# Patient Record
Sex: Female | Born: 1935 | Race: White | Hispanic: No | Smoking: Never smoker
Health system: Southern US, Community
[De-identification: ages and names within clinical notes are randomized; demographics above are authoritative.]

## PROBLEM LIST (undated history)

## (undated) DIAGNOSIS — M1990 Unspecified osteoarthritis, unspecified site: Secondary | ICD-10-CM

## (undated) DIAGNOSIS — I495 Sick sinus syndrome: Secondary | ICD-10-CM

## (undated) DIAGNOSIS — I10 Essential (primary) hypertension: Secondary | ICD-10-CM

## (undated) DIAGNOSIS — F32A Depression, unspecified: Secondary | ICD-10-CM

## (undated) DIAGNOSIS — Z79899 Other long term (current) drug therapy: Secondary | ICD-10-CM

## (undated) DIAGNOSIS — M810 Age-related osteoporosis without current pathological fracture: Secondary | ICD-10-CM

## (undated) DIAGNOSIS — K56609 Unspecified intestinal obstruction, unspecified as to partial versus complete obstruction: Secondary | ICD-10-CM

## (undated) DIAGNOSIS — Z7901 Long term (current) use of anticoagulants: Secondary | ICD-10-CM

## (undated) DIAGNOSIS — F419 Anxiety disorder, unspecified: Secondary | ICD-10-CM

## (undated) DIAGNOSIS — F410 Panic disorder [episodic paroxysmal anxiety] without agoraphobia: Secondary | ICD-10-CM

## (undated) DIAGNOSIS — I480 Paroxysmal atrial fibrillation: Secondary | ICD-10-CM

## (undated) DIAGNOSIS — E119 Type 2 diabetes mellitus without complications: Secondary | ICD-10-CM

## (undated) DIAGNOSIS — N200 Calculus of kidney: Secondary | ICD-10-CM

## (undated) DIAGNOSIS — F329 Major depressive disorder, single episode, unspecified: Secondary | ICD-10-CM

## (undated) DIAGNOSIS — C4490 Unspecified malignant neoplasm of skin, unspecified: Secondary | ICD-10-CM

## (undated) DIAGNOSIS — I351 Nonrheumatic aortic (valve) insufficiency: Secondary | ICD-10-CM

## (undated) DIAGNOSIS — I5032 Chronic diastolic (congestive) heart failure: Secondary | ICD-10-CM

## (undated) HISTORY — DX: Essential (primary) hypertension: I10

## (undated) HISTORY — DX: Paroxysmal atrial fibrillation: I48.0

## (undated) HISTORY — DX: Nonrheumatic aortic (valve) insufficiency: I35.1

## (undated) HISTORY — PX: TONSILLECTOMY: SUR1361

## (undated) HISTORY — PX: SKIN CANCER EXCISION: SHX779

## (undated) HISTORY — DX: Sick sinus syndrome: I49.5

## (undated) HISTORY — DX: Long term (current) use of anticoagulants: Z79.01

## (undated) HISTORY — DX: Anxiety disorder, unspecified: F41.9

## (undated) HISTORY — PX: BREAST CYST EXCISION: SHX579

## (undated) HISTORY — DX: Other long term (current) drug therapy: Z79.899

## (undated) HISTORY — DX: Chronic diastolic (congestive) heart failure: I50.32

---
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
## 1988-09-24 HISTORY — PX: CATARACT EXTRACTION W/ INTRAOCULAR LENS  IMPLANT, BILATERAL: SHX1307

## 1997-12-17 ENCOUNTER — Other Ambulatory Visit: Admission: RE | Admit: 1997-12-17 | Discharge: 1997-12-17 | Payer: Self-pay | Admitting: Cardiology

## 1999-02-04 ENCOUNTER — Encounter: Payer: Self-pay | Admitting: Cardiology

## 1999-02-04 ENCOUNTER — Encounter: Admission: RE | Admit: 1999-02-04 | Discharge: 1999-02-04 | Payer: Self-pay | Admitting: Cardiology

## 2000-02-07 ENCOUNTER — Encounter: Admission: RE | Admit: 2000-02-07 | Discharge: 2000-02-07 | Payer: Self-pay | Admitting: Internal Medicine

## 2000-02-07 ENCOUNTER — Encounter: Payer: Self-pay | Admitting: Internal Medicine

## 2001-11-07 ENCOUNTER — Encounter: Payer: Self-pay | Admitting: Ophthalmology

## 2001-11-09 ENCOUNTER — Ambulatory Visit (HOSPITAL_COMMUNITY): Admission: RE | Admit: 2001-11-09 | Discharge: 2001-11-09 | Payer: Self-pay | Admitting: Ophthalmology

## 2002-04-23 ENCOUNTER — Encounter: Payer: Self-pay | Admitting: Internal Medicine

## 2002-04-23 ENCOUNTER — Encounter: Admission: RE | Admit: 2002-04-23 | Discharge: 2002-04-23 | Payer: Self-pay | Admitting: Internal Medicine

## 2003-06-27 ENCOUNTER — Ambulatory Visit (HOSPITAL_COMMUNITY): Admission: RE | Admit: 2003-06-27 | Discharge: 2003-06-27 | Payer: Self-pay | Admitting: Ophthalmology

## 2003-09-09 ENCOUNTER — Encounter: Admission: RE | Admit: 2003-09-09 | Discharge: 2003-09-09 | Payer: Self-pay | Admitting: Internal Medicine

## 2004-09-09 ENCOUNTER — Encounter: Admission: RE | Admit: 2004-09-09 | Discharge: 2004-09-09 | Payer: Self-pay | Admitting: Internal Medicine

## 2004-09-20 ENCOUNTER — Encounter: Admission: RE | Admit: 2004-09-20 | Discharge: 2004-09-20 | Payer: Self-pay | Admitting: Internal Medicine

## 2004-12-09 HISTORY — PX: CARDIOVASCULAR STRESS TEST: SHX262

## 2005-04-10 ENCOUNTER — Emergency Department (HOSPITAL_COMMUNITY): Admission: EM | Admit: 2005-04-10 | Discharge: 2005-04-10 | Payer: Self-pay | Admitting: Emergency Medicine

## 2005-10-04 ENCOUNTER — Emergency Department (HOSPITAL_COMMUNITY): Admission: EM | Admit: 2005-10-04 | Discharge: 2005-10-04 | Payer: Self-pay | Admitting: Emergency Medicine

## 2005-11-17 ENCOUNTER — Encounter: Admission: RE | Admit: 2005-11-17 | Discharge: 2005-11-17 | Payer: Self-pay | Admitting: Internal Medicine

## 2005-12-01 ENCOUNTER — Encounter: Admission: RE | Admit: 2005-12-01 | Discharge: 2005-12-01 | Payer: Self-pay | Admitting: Internal Medicine

## 2006-03-12 ENCOUNTER — Emergency Department (HOSPITAL_COMMUNITY): Admission: EM | Admit: 2006-03-12 | Discharge: 2006-03-12 | Payer: Self-pay | Admitting: Emergency Medicine

## 2006-03-16 ENCOUNTER — Observation Stay (HOSPITAL_COMMUNITY): Admission: AD | Admit: 2006-03-16 | Discharge: 2006-03-17 | Payer: Self-pay | Admitting: *Deleted

## 2006-03-16 HISTORY — PX: INSERT / REPLACE / REMOVE PACEMAKER: SUR710

## 2006-05-31 ENCOUNTER — Encounter: Admission: RE | Admit: 2006-05-31 | Discharge: 2006-05-31 | Payer: Self-pay | Admitting: Internal Medicine

## 2006-11-06 ENCOUNTER — Encounter: Admission: RE | Admit: 2006-11-06 | Discharge: 2006-11-06 | Payer: Self-pay | Admitting: Internal Medicine

## 2007-01-30 ENCOUNTER — Encounter: Admission: RE | Admit: 2007-01-30 | Discharge: 2007-01-30 | Payer: Self-pay | Admitting: Internal Medicine

## 2007-02-12 ENCOUNTER — Encounter: Admission: RE | Admit: 2007-02-12 | Discharge: 2007-02-12 | Payer: Self-pay | Admitting: Internal Medicine

## 2009-06-09 ENCOUNTER — Ambulatory Visit (HOSPITAL_COMMUNITY): Admission: RE | Admit: 2009-06-09 | Discharge: 2009-06-09 | Payer: Self-pay | Admitting: Internal Medicine

## 2009-12-05 ENCOUNTER — Encounter: Payer: Self-pay | Admitting: Internal Medicine

## 2009-12-23 ENCOUNTER — Ambulatory Visit: Payer: Self-pay | Admitting: Internal Medicine

## 2010-02-23 NOTE — Miscellaneous (Signed)
Summary: Device preload  Clinical Lists Changes  Observations: Added new observation of PPM INDICATN: Sick sinus syndrome (12/05/2009 11:13) Added new observation of MAGNET RTE: BOL 85 ERI 65 (12/05/2009 11:13) Added new observation of PPMLEADSTAT2: active (12/05/2009 11:13) Added new observation of PPMLEADSER2: [id number] (12/05/2009 11:13) Added new observation of PPMLEADMOD2: 4076  (12/05/2009 11:13) Added new observation of PPMLEADDOI2: 03/16/2006  (12/05/2009 11:13) Added new observation of PPMLEADLOC2: RV  (12/05/2009 11:13) Added new observation of PPMLEADSTAT1: active  (12/05/2009 11:13) Added new observation of PPMLEADSER1: [id number]  (12/05/2009 11:13) Added new observation of PPMLEADMOD1: 5076  (12/05/2009 11:13) Added new observation of PPMLEADDOI1: 03/16/2006  (12/05/2009 11:13) Added new observation of PPMLEADLOC1: RA  (12/05/2009 11:13) Added new observation of PPM DOI: 03/16/2006  (12/05/2009 11:13) Added new observation of PPM SERL#: [id number]  (12/05/2009 11:13) Added new observation of PPM MODL#: [id number]  (12/05/2009 11:13) Added new observation of PACEMAKERMFG: Medtronic  (12/05/2009 11:13) Added new observation of PPM IMP MD: Charlynn Court  (12/05/2009 11:13) Added new observation of PPM REFER MD: Vonna Drafts  (12/05/2009 11:13) Added new observation of PACEMAKER MD: Hillis Range, MD  (12/05/2009 11:13)      PPM Specifications Following MD:  Hillis Range, MD     Referring MD:  Vonna Drafts PPM Vendor:  Medtronic     PPM Model Number:  [id number]     PPM Serial Number:  [id number] PPM DOI:  03/16/2006     PPM Implanting MD:  Charlynn Court  Lead 1    Location: RA     DOI: 03/16/2006     Model #: [id number]     Serial #: [id number]     Status: active Lead 2    Location: RV     DOI: 03/16/2006     Model #: [id number]     Serial #: [id number]     Status: active  Magnet Response Rate:  BOL 85 ERI 65  Indications:  Sick sinus syndrome

## 2010-02-23 NOTE — Procedures (Signed)
Summary: pacer check/medtronic   Current Medications (verified): 1)  Aspirin 325 Mg Tabs (Aspirin) .... One By Mouth Daily 2)  Toprol Xl 50 Mg Xr24h-Tab (Metoprolol Succinate) .... 1/2 By Mouth Two Times A Day 3)  Crestor 5 Mg Tabs (Rosuvastatin Calcium) .... One By Mouth Daily 4)  Zoloft 50 Mg Tabs (Sertraline Hcl) .... One By Mouth Daily 5)  Ativan 0.5 Mg Tabs (Lorazepam) .... As Needed 6)  Fish Oil 1000 Mg Caps (Omega-3 Fatty Acids) .... One By Mouth Daily 7)  Citracal Plus  Tabs (Multiple Minerals-Vitamins) .... One By Mouth Daily 8)  Vitamin D3 50000 Unit Tabs (Cholecalciferol) .... One By Mouth Weekly  Allergies (verified): 1)  ! Sulfa  PPM Specifications Following MD:  Hillis Range, MD     Referring MD:  Vonna Drafts PPM Vendor:  Medtronic     PPM Model Number:  [id number]     PPM Serial Number:  [id number] PPM DOI:  03/16/2006     PPM Implanting MD:  Charlynn Court  Lead 1    Location: RA     DOI: 03/16/2006     Model #: 5076     Serial #: [id number]     Status: active Lead 2    Location: RV     DOI: 03/16/2006     Model #: 5784     Serial #: [id number]     Status: active  Magnet Response Rate:  BOL 85 ERI 65  Indications:  Sick sinus syndrome   PPM Follow Up Remote Check?  No Battery Voltage:  2.99 V       PPM Device Measurements Atrium  Amplitude: 2.4 mV, Impedance: 568 ohms, Threshold: 0.5 V at 0.4 msec Right Ventricle  Amplitude: 4.5 mV, Impedance: 416 ohms, Threshold: 1.0 V at 0.4 msec  Episodes MS Episodes:  1     Percent Mode Switch:  <0.1%     Coumadin:  No Ventricular High Rate:  97%     Atrial Pacing:  0.2%      Parameters Mode:  DDDR+     Lower Rate Limit:  70     Upper Rate Limit:  130 Paced AV Delay:  180     Sensed AV Delay:  150 Next Remote Date:  03/25/2010     Next Cardiology Appt Due:  11/25/2010 Tech Comments:  Atrial sensitivity reprogrammed for FFRW.  Carelink transmissions every 3 months.  ROV 1 year with Dr. Johney Frame. Altha Harm, LPN   December 23, 2009 12:07 PM

## 2010-02-25 NOTE — Cardiovascular Report (Signed)
Summary: Office Visit   Office Visit   Imported By: Roderic Ovens 01/04/2010 13:47:17  _____________________________________________________________________  External Attachment:    Type:   Image     Comment:   External Document

## 2010-03-12 ENCOUNTER — Ambulatory Visit (INDEPENDENT_AMBULATORY_CARE_PROVIDER_SITE_OTHER): Payer: Medicare Other | Admitting: *Deleted

## 2010-03-12 DIAGNOSIS — E7800 Pure hypercholesterolemia, unspecified: Secondary | ICD-10-CM

## 2010-03-12 DIAGNOSIS — R002 Palpitations: Secondary | ICD-10-CM

## 2010-03-12 DIAGNOSIS — I4891 Unspecified atrial fibrillation: Secondary | ICD-10-CM

## 2010-03-28 ENCOUNTER — Encounter (INDEPENDENT_AMBULATORY_CARE_PROVIDER_SITE_OTHER): Payer: Self-pay | Admitting: *Deleted

## 2010-03-29 ENCOUNTER — Encounter (INDEPENDENT_AMBULATORY_CARE_PROVIDER_SITE_OTHER): Payer: Medicare Other

## 2010-03-29 ENCOUNTER — Encounter: Payer: Self-pay | Admitting: Internal Medicine

## 2010-03-29 DIAGNOSIS — I495 Sick sinus syndrome: Secondary | ICD-10-CM

## 2010-04-06 NOTE — Letter (Signed)
Summary: Device-Delinquent Phone Journalist, newspaper, Main Office  [address]   [place], [place] [address]   Phone: [phone]  Fax: [phone]     March 28, 2010 MRN: [id number]   [facility] [address] [place], [place]  [address]   Dear Jo [name],  According to our records, you were scheduled for a device phone transmission on 03-25-10.     We did not receive any results from this check.  If you transmitted on your scheduled day, please call us to help troubleshoot your system.  If you forgot to send your transmission, please send one upon receipt of this letter.  Thank you,   Architectural technologist [facility]

## 2010-04-12 ENCOUNTER — Encounter: Payer: Self-pay | Admitting: *Deleted

## 2010-04-22 NOTE — Cardiovascular Report (Signed)
Summary: Office Visit   Office Visit   Imported By: Roderic Ovens 04/13/2010 14:07:59  _____________________________________________________________________  External Attachment:    Type:   Image     Comment:   External Document

## 2010-04-22 NOTE — Letter (Signed)
Summary: Remote Device Check  [employer], Main Office  [address]   [place], [place] [address]   Phone: [phone]  Fax: [phone]     April 12, 2010 MRN: [id number]   [facility] [address] [place], [place]  [address]   Dear Ms. Jo [name],   Your remote transmission was recieved and reviewed by your physician.  All diagnostics were within normal limits for you.  __X___Your next transmission is scheduled for: 07-01-2010.  Please transmit at any time this day.  If you have a wireless device your transmission will be sent automatically.   Sincerely,  Vella Kohler

## 2010-06-11 NOTE — Op Note (Signed)
NAMEMarland Kitchen  Jo [name], Jo [name]                             ACCOUNT NO.:  [id number]   MEDICAL RECORD NO.:  [id number]                   PATIENT TYPE:  OIB   LOCATION:  2876                                 FACILITY:  [facility]   PHYSICIAN:  Guadelupe Sabin, M.D.             DATE OF BIRTH:  [birth year]   DATE OF PROCEDURE:  11/09/2001  DATE OF DISCHARGE:  11/09/2001                                 OPERATIVE REPORT   PREOPERATIVE DIAGNOSIS:  Anterior subcapsular, cortical, and nuclear  cataract, right eye.   POSTOPERATIVE DIAGNOSIS:  Anterior subcapsular, cortical, and nuclear  cataract, right eye.   OPERATION:  Planned extracapsular cataract extraction, Fick emulsification,  primary insertion of posterior chamber intraocular lens implant.   SURGEON:  Guadelupe Sabin, M.D.   ASSISTANT:  Nurse.   ANESTHESIA:  Local 4% Xylocaine, 0.75 Marcaine, anesthesia standby required.  The patient is given Ditropan intravenously during the period of retrobulbar  blocking.   DESCRIPTION OF PROCEDURE:  After the patient was prepped and draped the lid  speculum was inserted in the right eye.  The eye was turned downward, and a  superior rectus traction suture placed.  Schiotz tenometry was recorded at 5  scale units with a 5.5 g weight.  A peritomy was performed adjacent to the  limbus from the 11 to 1 o'clock position.  The corneoscleral junction was  cleaned, and a corneoscleral groove made with a 45 degree Superblade.  The  anterior chamber was then entered with the 2.5 mm diamond keratome at the 12  o'clock position and the 15 degree blade at the 2:30 position.  Using a bent  26 gauge needle on a Healon syringe, a circular capsular rexus was begun,  and then completed with the Grabow forceps.  Hydrodissection and  hydrodelineation were performed using 1% Xylocaine.  The 30 degree Fink  emulsification tip was then inserted with slow controlled emulsification  with back-cracking and fragmentation with the  Beckard pick.  Following  removal of the nucleus, total ultrasonic time was 1 minute 2 seconds,  average power level 17%, 60 cc of fluid were used.  Following the nucleus  removal the remaining cortex was aspirated with the irrigation aspiration  tip.  The posterior capsule appeared intact with a brilliant red fundus  reflex.  It was therefore elected to insert an [employer] medical optics SI40NB  silicone three piece posterior chamber interocular lens implant with UV  absorber.  Diopter strength +20.00.  This was inserted with the McDonald  forceps into the anterior chamber and then centered into the capsular bag  using the [facility] lens rotator.  The lens appeared to be well centered.  The  Healon which had been used during the procedure was aspirated and replaced  with balanced salt solution and Miochol ophthalmic solution.  The operative  incision appeared to be slightly leaking at the 12 o'clock position, and  it  was elected to place a single 10-0 interrupted nylon  radial suture to close the incision securely.  Maxitrol ointment was  instilled in the conjunctival cul-de-sac, and the light patch and protector  shield applied.  Duration of procedure and anesthesia administration one  hour.  The patient tolerated the procedure well, in general, left the  operating room for the recovery room in good condition.                                               Guadelupe Sabin, M.D.    [name]/MEDQ  D:  11/09/2001  T:  11/10/2001  Job:  [id number]   cc:   Loraine Leriche A. Perini, M.D.

## 2010-06-11 NOTE — Op Note (Signed)
Jo [name], SHEVLIN NO.:  [id number]   MEDICAL RECORD NO.:  [id number]          PATIENT TYPE:  OIB   LOCATION:  2807                         FACILITY:  [facility]   PHYSICIAN:  Elmore Guise., M.D.DATE OF BIRTH:  August 01, 1917   DATE OF PROCEDURE:  03/16/2006  DATE OF DISCHARGE:                               OPERATIVE REPORT   INDICATIONS FOR PROCEDURE:  Sick sinus syndrome, bradycardia.   HISTORY OF PRESENT ILLNESS:  Ms. Attaway is a very pleasant 77 year old  white female with past medical history of paroxysmal atrial fibrillation  who presented with fatigue and weakness.  She underwent Holter  monitoring which was consistent with sick sinus syndrome.  She is now  referred for pacemaker implant.   PROCEDURES DESCRIPTION:  Patient was brought to the cardiac cath lab  after appropriate informed consent.  She is prepped and draped in  sterile fashion.  Approximately 15 mL of 1% lidocaine was used for local  anesthesia.  A venogram was performed showing the course of the left  axillary/subclavian vein.  Left subclavian vein was then accessed after  multiple attempts medially.  We did use multiple attempts over the first  rib as well as over the second rib.  A second venogram was performed  showing spasm of the left axillary/subclavian vein.  A third venogram  was also performed showing some improvement in her flow through this  vein but continued to spasm at that area.  The Doppler needle was used  for assistance with access.  The left subclavian vein was punctured in 2  separate sticks.  Two 7-French safety sheaths were placed over the  retained wires.  The ventricular lead is a Medtronic active fixation  CapSureFix R Novus 4076-52 cm, serial number [id number].   The following measurements were obtained:  Impedance of 952 ohms.  Sensing of 9.4 volts with a threshold of 1.0 volts at 0.5 milliseconds  and a current of 1.2 mA.  The atrial lead was then placed.  The  atrial  lead is a Medtronic 5076-45 cm, serial number [id number].  The following  measurements were obtained:  Impedance of 743 ohms.  P-waves measured  3.1 mV with threshold of 0.7 volts at 0.5 milliseconds, a current of 0.9  mA.  The safety sheaths were then removed.  The atrial and ventricular  lead were sewn into the pocket.  The pocket was irrigated with kanamycin  solution.  The atrial and ventricular leads were then identified and  placed into the appropriate portions of the header in a Medtronic  EnRhythm, P1501DR, serial number [id number].  The pacemaker was sewn  into the pocket.  The pocket was then closed in 3 continuous layers with  2-0 followed by 2-0 followed by 4-0 Vicryl suture.  Steri-Strips were  placed across the wound.  The patient tolerated the procedure well with  no apparent complications.      Elmore Guise., M.D.  Electronically Signed     TWK/MEDQ  D:  03/16/2006  T:  03/16/2006  Job:  [id number]

## 2010-06-11 NOTE — Op Note (Signed)
NAMEMarland Kitchen  Jo [name], Jo [name]                             ACCOUNT NO.:  [id number]   MEDICAL RECORD NO.:  [id number]                   PATIENT TYPE:  OIB   LOCATION:  2899                                 FACILITY:  [facility]   PHYSICIAN:  Guadelupe Sabin, M.D.             DATE OF BIRTH:  12-12-1917   DATE OF PROCEDURE:  06/27/2003  DATE OF DISCHARGE:  06/27/2003                                 OPERATIVE REPORT   PREOPERATIVE DIAGNOSIS:  Senile anterior subcapsular cortical and nuclear  cataract, left eye.   POSTOPERATIVE DIAGNOSIS:  Senile anterior subcapsular cortical and nuclear  cataract, left eye.   NAME OF OPERATION:  Planned extracapsular cataract extraction-  phacoemulsification, primary insertion of posterior chamber intraocular lens  implant.   SURGEON:  Guadelupe Sabin, M.D.   ASSISTANT:  Nurse.   ANESTHESIA:  Local 4% Xylocaine, 0.75% Marcaine retrobulbar block with  Wydase added.  Topical Tetracaine.  Intraocular Xylocaine.  Anesthesia  standby required in this 77 year old patient.  The patient was given  Ditropan intravenously during the period of retrobulbar injection.   OPERATIVE PROCEDURE:  After the patient was prepped and draped, a lid  speculum was inserted in the left eye.  The eye was turned downward and a  superior rectus traction suture placed.  Schiotz tonometry was recorded at 8  scale units with a 5.5 g weight, indicating a normal pressure.   OPERATIVE PROCEDURE:  After the patient was prepped and draped, a lid  speculum was inserted in the left eye.  A peritomy was performed adjacent to  the limbus from the 11 to 1 o'clock position.  The corneoscleral junction  was cleaned and a corneoscleral groove made with a 45 degree Superblade.  The anterior chamber was then entered with the 2.5 mm diamond keratome at  the 12 o'clock position and the 15 degree blade at the 2:30 position.  Using  a bent 26-gauge needle on an Occucoat syringe, a circular capsulorhexis was  begun and then completed with the Grabow forceps.  Hydrodissection and  hydrodelineation were performed using 1% Xylocaine.  A 30 degree  phacoemulsification tip was then inserted and slow controlled emulsification  of the lens nucleus accomplished with back-cracking with the Bechert pick.  Total ultrasonic time 1 minute 25 seconds, average power level 13%, total  amount of fluid used 90 mL.  Following removal of the nucleus, the residual  cortex was aspirated with the irrigation/aspiration tip.  The posterior  capsule appeared intact with a brilliant red fundus reflex.  It was  therefore elected to insert an [employer]er[employer] Optics SI40NB silicone three-  piece posterior chamber intraocular lens implant, diopter strength +21.50.  This was inserted with the McDonald forceps into the anterior chamber and  then centered into the capsular bag using the [facility] lens rotator.  The lens  appeared to be well-centered.  The Healon and Occucoat which had been  used  during the procedure intermittently was removed and replaced with balanced  salt solution and Miochol ophthalmic solution.  The operative incisions  appeared to be self-sealing.  It was elected, however, to place a single 10-  0 radial nylon suture at the 12 o'clock position to ensure closure and to  prevent  endophthalmitis.  Maxitrol ointment was instilled in the conjunctival cul-de-  sac and a light patch and protective shield applied.  Duration of procedure  45 minutes.  The patient tolerated the procedure well in general, left the  operating room for the recovery room in good condition.                                               Guadelupe Sabin, M.D.    [name]/MEDQ  D:  06/27/2003  T:  06/28/2003  Job:  [id number]

## 2010-06-11 NOTE — Discharge Summary (Signed)
[name], [name] NO.:  [id number]   MEDICAL RECORD NO.:  [id number]          PATIENT TYPE:  INP   LOCATION:  2032                         FACILITY:  [facility]   PHYSICIAN:  Elmore Guise., M.D.DATE OF BIRTH:  [birth year]   DATE OF ADMISSION:  03/16/2006  DATE OF DISCHARGE:  03/17/2006                               DISCHARGE SUMMARY   DISCHARGE DIAGNOSES:  1. Sick sinus syndrome status post dual-chamber permanent pacemaker      with a Medtronic EnRhythm generator.  2. History of paroxysmal atrial fibrillation now in normal sinus      rhythm.  3. History of hypertension.   HISTORY OF PRESENT ILLNESS:  The patient is a very pleasant 77 year old  white female who has had recent ER evaluation for fatigue and  presyncope.  She was found to be bradycardic with heart rates in the  40s.  She was then referred for pacemaker implant.   HOSPITAL COURSE:  The patient underwent permanent pacemaker implant on  March 16, 2006.  The procedure was prolonged; however, once venous  access was obtained, the procedure went very well.  A Smart needle as  well as venograms were performed to help with venous access.  Post-  procedure, the patient did very well.  She was A-pace, V-sense 98% at  time.  Her blood pressure remained stable, and her post-procedure chest  x-ray showed appropriate position of her atrial and ventricular lead  with no pneumothorax and no pleural or pericardial effusion noted.  Her  pacemaker was interrogated on post-procedure day #1 and showed excellent  thresholds in the atrial and ventricular chambers of 0.5 volts at 0.4  milliseconds, respectively.  Her impedance was normal, and her sensing  was also normal.  We discussed post-pacemaker restrictions, including  decreased activity with her left arm for the next two weeks as well as  keeping her site clean and dry for the next week.  She will Betadine her  Steri-Strips twice a day for the next three  days upon discharge.  She is  to call the office should she have any questions or concerns regarding  her device.  A restriction sheet as well as a pacemaker booklet was  given to the patient.   DISCHARGE MEDICATIONS:  1. Toprol XL 25 mg daily.  2. Zoloft 50 mg daily.  3. Clonazepam 0.5 mg one in the morning, a quarter at night.  4. Aspirin, a half a tablet daily.  5. Calcium once daily.  6. Multivitamin daily.  7. Fish oil tablets daily.  8. Augmentin 875 mg two times daily for the next five days.  9. Tylenol extra-strength 650 mg q.6h. as needed for pain.  10.She is to stop/hold her Diovan at this time.   All of her questions were answered.  She will follow up with Dr. Reyes Ivan  in one week for blood pressure check as well as wound check.      Elmore Guise., M.D.  Electronically Signed     TWK/MEDQ  D:  03/17/2006  T:  03/17/2006  Job:  [id number]   cc:   Loraine Leriche A. Perini, M.D.

## 2010-06-11 NOTE — H&P (Signed)
NAMEMarland Kitchen  Jo [name], Jo [name]                             ACCOUNT NO.:  [id number]   MEDICAL RECORD NO.:  [id number]                   [name] TYPE:  OIB   LOCATION:  2876                                 FACILITY:  [facility]   PHYSICIAN:  Guadelupe Sabin, M.D.             DATE OF BIRTH:  08-03-1917   DATE OF ADMISSION:  11/09/2001  DATE OF DISCHARGE:  11/09/2001                                HISTORY & PHYSICAL   HISTORY OF PRESENT ILLNESS:  This was a planned outpatient surgical  admission of this 77 year old white female admitted for cataract implant  surgery of Jo right eye.   [name] has been followed in my office since 11/11/68, for routine eye  care.  She was initially noted to have presbyopia.  Over Jo ensuing years,  Jo [name] has gradually developed a dense subcapsular, cortical, and  nuclear cataract formation of both eyes.  This has caused her to have  difficulty with seeing, presby vision, small print difficult to read, and  glare bothers her.  She has signed an informed consent, and arrangements are  made for her outpatient admission at this time.   PAST MEDICAL HISTORY:  Jo [name] is in stable general health under Jo  care of Dr. Waynard Edwards. Jo [name] is felt to be in satisfactory condition for  Jo proposed surgery, and to be a low risk.   CURRENT MEDICATIONS:  1. Toprol XL.  2. Zoloft.  3. Klonopin.   ALLERGIES:  1. SULFA DRUGS.  2. CELEXA.   REVIEW OF SYMPTOMS:  No current cardiorespiratory complaints.   PHYSICAL EXAMINATION:  VITAL SIGNS:  As recorded on admission, blood  pressure 131/69, respirations 16, heart rate 55, temperature 97.2.  GENERAL:  Jo [name] is a pleasant 77 year old white female in no acute  distress.  HEENT:  Eyes:  Visual acuity 20/30 right eye, 20/30 left eye.  Glare testing  less than 20/400 right eye, less than 20/400 left eye.  Slit lamp  examination:  Jo eyes are white and clean with dense anterior subcapsular  cataract,  cortical, and nuclear cataract change.  Fundus is difficult to see  due to Jo dense anterior capsular change.  Jo vitreous, however, appears  clear.  Jo retina attached.  Jo optic nerve sharply outlined of good  color, disk cup ratio 0.3.  Jo macula appears normal.  CHEST:  Lungs clear to auscultation and percussion.  HEART:  Normal sinus rhythm, no cardiomegaly, no murmurs.  ABDOMEN:  Negative.  EXTREMITIES:  Negative.    ADMISSION DIAGNOSES:  Anterior subcapsular, nuclear, and cortical cataract,  both eyes.   SURGICAL PLAN:  Cataract implant surgery right eye now, left eye later.  Guadelupe Sabin, M.D.    [name]/MEDQ  D:  11/09/2001  T:  11/10/2001  Job:  [id number]   cc:   Loraine Leriche A. Perini, M.D.

## 2010-06-11 NOTE — H&P (Signed)
NAMEDONIS, KOTOWSKI NO.:  [id number]   MEDICAL RECORD NO.:  [id number]           PATIENT TYPE:   LOCATION:                               FACILITY:  [facility]   PHYSICIAN:  Elmore Guise., M.D.DATE OF BIRTH:  [birth year]   DATE OF ADMISSION:  03/16/2006  DATE OF DISCHARGE:                              HISTORY & PHYSICAL   INDICATION FOR ADMISSION:  Sick sinus syndrome, for permanent pacemaker  implant.   HISTORY OF PRESENT ILLNESS:  Ms. Jo [name] is a very pleasant 77 year old  white female with past medical history of paroxysmal atrial  fibrillation, hypertension, and anxiety who will be admitted for  pacemaker implant.  The patient has been followed here for the last 2  years, and she initially was doing very well, walking 1-2 miles per day.  She was rate controlled with low-dose Toprol, however continued to have  breakthroughs of paroxysmal atrial fibrillation which required  amiodarone.  Over the last 3 months, she has had progressive decreased  exertional tolerance, weakness, and fatigue. She checks her blood  pressure at home, and her blood pressure was elevated in the 140-160  range, with her heart rate measuring anywhere from 40-50 beats per  minute.  She has now titrated off her amiodarone.  She went to the  emergency room last evening because of fatigue and atypical chest pain.  There, she was found to have a heart rate in the mid-40s.  She had  normal blood work. She denied any fever or cough, no orthopnea, no PND,  and denies any palpitations.  She now presents for further evaluation.  Today, her heart rate is in the low 50s, and she feels better but is  still very fatigued.  Last time she came offered Toprol, she was off it  for 2 days and had tachyarrhythmias. She is very worried about coming  off her beta blocker because of her heart rate going too fast.  We  have decreased down her Toprol to as low as possible at this time.  Therefore, she will be  admitted for pacemaker implant.   CURRENT MEDICATIONS:  1. Toprol XL 25 mg daily.  2. Zoloft 50 mg daily.  3. Clonazepam 0.5 mg, in the morning, 1/4 pill at night.  4. Aspirin.  5. Fish oil.  6. Diovan/HCTZ 80/12.5 mg daily.   ALLERGIES:  1. CELEXA.  2. SULFA.   FAMILY HISTORY:  Positive for emphysema, cancer, cirrhosis.  No  significant history of coronary artery disease or heart problems.   SOCIAL HISTORY:  She is widowed.  She is currently retired. No tobacco  or alcohol history.   PHYSICAL EXAMINATION:  VITAL SIGNS:  Her weight 123 pounds.  Her blood  pressure is 130/60, her heart rate is 52 and regular.  GENERAL:  She is a very pleasant elderly white female, alert and  oriented x4.  No acute distress.  NECK:  She has no JVD.  LUNGS:  Clear.  HEART:  Regular, with a normal S1, S2.  2/6 systolic ejection murmur  noted.  ABDOMEN:  Soft, nontender, nondistended.  No rebound or guarding.  EXTREMITIES:  Warm, with 2+ pulses, and no significant edema.   Her blood work was reviewed from the hospital.  It showed a white count  of 10, hemoglobin of 14.2, platelet count of 265.  She had a BUN and  creatinine of 20 and 0.8, potassium level 4, sodium level 133.  LFTs  were normal.  Her D-dimer was 0.47.  Her cardiac enzymes were negative.  By report, her EKG showed sinus bradycardia, rate of 47 per minute, with  no significant ST or T-wave changes.  Her last Holter was done here in  the office in August 2007, showing heart rate ranging from 42-64, with a  mean heart rate of 50. At that time, we tried to cut back on her Toprol.  However, she had tachyarrhythmias on stopping her beta blocker.   IMPRESSION:  1. Sick sinus syndrome.  2. History of paroxysmal atrial fibrillation.  3. History of mild to moderate aortic insufficiency.   PLAN:  I discussed the risks and benefits of permanent pacemaker with  her at length.  Her procedure will be scheduled for this Thursday. We  will  cut down her Toprol to 12.5 mg daily.  She is off her amiodarone at  this time.  Her blood work was reviewed.  She will have a PT and PTT  done today.  She is to call should she have any further questions.      Elmore Guise., M.D.  Electronically Signed     TWK/MEDQ  D:  03/13/2006  T:  03/14/2006  Job:  [id number]   cc:   Loraine Leriche A. Perini, M.D.

## 2010-06-11 NOTE — H&P (Signed)
NAMEMarland Kitchen  Jo [name], Jo [name]                             ACCOUNT NO.:  [id number]   MEDICAL RECORD NO.:  [id number]                   PATIENT TYPE:  OIB   LOCATION:  2899                                 FACILITY:  [facility]   PHYSICIAN:  Guadelupe Sabin, M.D.             DATE OF BIRTH:  03-22-1917   DATE OF ADMISSION:  06/27/2003  DATE OF DISCHARGE:  06/27/2003                                HISTORY & PHYSICAL   CHIEF COMPLAINT:  This was a planned outpatient readmission of this 77-year-  old white female admitted for cataract implant surgery of the left eye.   HISTORY OF PRESENT ILLNESS:  This patient has noted a deterioration of the  vision in both eyes.  She was previously admitted on November 09, 2001, for a  cataract implant surgery of the left eye.  This was uncomplicated, and the  patient did well following surgery with a return of vision to 20/25+.  Meanwhile recently the un-operated eye has bothered her with increasing  blurring, difficulty wearing her anisotropic bifocals.  She has requested a  cataract surgery, as she is experiencing increasing difficulty.  Glare  testing of the left eye was less than 20/400.  She was given an oral  discussion and printed information concerning the procedure and its possible  complications.  She signed an informed consent and arrangements were made  for her outpatient admission at this time.   PAST MEDICAL HISTORY:  The patient is under the care of Dr. Loraine Leriche A. Perini.   MEDICAL DECISION MAKING:  1. The patient takes multiple medications including aspirin 81 mg daily.  2. Zoloft.  3. Clonazepam.  4. Toprol XL.  5. Vytorin.  6. Fosamax.  7. Multivitamins.  8. Fish oil.  9. Flax seed.   REVIEW OF SYSTEMS:  No cardiorespiratory complaints.   PHYSICAL EXAMINATION:  VITAL SIGNS:  As recorded on admission, blood  pressure 142/72, pulse 65, respirations 20 temperature 96.8 degrees.  GENERAL:  The patient is a pleasant well-nourished, well-developed  77-year-  old white female, in no acute distress.  HEENT:  Eyes:  Visual acuity with the best correction is 20/25+2 in the  right eye, 20/30+2 in the left eye.  Near vision is 20/30- right eye and  20/40- left eye.  Applanation tonometry is 12 m right eye and 13 mm left  eye.  Slit lamp examination:  The eyes are white and clear with a clear  cornea and deep and clear anterior chamber.  A posterior chamber implant is  present in the right eye, and dense anterior subcapsular cortical and  nuclear change in the left eye.  Dilated fundus examination shows a clear  vitreous, attached retina with normal optic nerve, blood vessels, and  macula.  CHEST:  Lungs are clear to percussion and auscultation.  HEART:  Normal sinus rhythm, no cardiomegaly, no murmurs.  ABDOMEN:  Negative.  EXTREMITIES:  Negative.   ADMISSION DIAGNOSES:  1. Senile anterior subcapsular cortical and nuclear cataract, left eye.  2. Pseudophakia, right eye.   SURGICAL PLAN:  Cataract implant surgery of the left eye.                                                Guadelupe Sabin, M.D.    [name]/MEDQ  D:  06/27/2003  T:  06/28/2003  Job:  [id number]   cc:   Loraine Leriche A. Waynard Edwards, M.D.  [address]  [place]  [place] [address]  Fax: [phone]

## 2010-07-01 ENCOUNTER — Encounter: Payer: PRIVATE HEALTH INSURANCE | Admitting: *Deleted

## 2010-07-06 ENCOUNTER — Encounter: Payer: Self-pay | Admitting: *Deleted

## 2010-07-08 ENCOUNTER — Ambulatory Visit (INDEPENDENT_AMBULATORY_CARE_PROVIDER_SITE_OTHER): Payer: Medicare Other | Admitting: *Deleted

## 2010-07-08 ENCOUNTER — Other Ambulatory Visit: Payer: Self-pay | Admitting: Internal Medicine

## 2010-07-08 DIAGNOSIS — I495 Sick sinus syndrome: Secondary | ICD-10-CM

## 2010-07-12 NOTE — Progress Notes (Signed)
Pacer remote check  

## 2010-07-15 ENCOUNTER — Encounter: Payer: Self-pay | Admitting: *Deleted

## 2010-08-17 ENCOUNTER — Ambulatory Visit (HOSPITAL_COMMUNITY): Payer: Medicare Other | Attending: Internal Medicine

## 2010-08-17 DIAGNOSIS — M810 Age-related osteoporosis without current pathological fracture: Secondary | ICD-10-CM | POA: Insufficient documentation

## 2010-09-20 ENCOUNTER — Encounter: Payer: Self-pay | Admitting: Cardiology

## 2010-10-05 ENCOUNTER — Ambulatory Visit (INDEPENDENT_AMBULATORY_CARE_PROVIDER_SITE_OTHER): Payer: Medicare Other | Admitting: Cardiology

## 2010-10-05 ENCOUNTER — Encounter: Payer: Self-pay | Admitting: Cardiology

## 2010-10-05 DIAGNOSIS — I480 Paroxysmal atrial fibrillation: Secondary | ICD-10-CM | POA: Insufficient documentation

## 2010-10-05 DIAGNOSIS — Z8679 Personal history of other diseases of the circulatory system: Secondary | ICD-10-CM

## 2010-10-05 DIAGNOSIS — I495 Sick sinus syndrome: Secondary | ICD-10-CM | POA: Insufficient documentation

## 2010-10-05 DIAGNOSIS — I10 Essential (primary) hypertension: Secondary | ICD-10-CM | POA: Insufficient documentation

## 2010-10-05 DIAGNOSIS — I4891 Unspecified atrial fibrillation: Secondary | ICD-10-CM

## 2010-10-05 MED ORDER — CARVEDILOL 12.5 MG PO TABS: 12.5000 mg | ORAL_TABLET | Freq: Two times a day (BID) | ORAL | Status: DC

## 2010-10-05 NOTE — Assessment & Plan Note (Signed)
Blood pressure is poorly controlled. I recommended switching her metoprolol to carvedilol 12.5 mg twice a day. I think this will do a better job of blood pressure control.

## 2010-10-05 NOTE — Progress Notes (Signed)
Jo [name] Date of Birth: Feb 26, 1917   History of Present Illness: Jo [name] is seen today for followup. In August she had a bladder infection and required 2 courses of antibiotics. At that time her blood pressure was significantly elevated in the 170-180 systolic range. More recently her systolic readings have been between 144 and 150. She does note occasional heart racing he becomes very nervous with this. She feels that these episodes of increased recently. She also notes that she is losing her balance more. She's had no significant chest pain or shortness of breath. She did awake one night with the complaint of jaw pain. EMS was called and it was noted that her heart rate was up 128 at the time. She has no history of stroke. She's had no recent falls.  Current Outpatient Prescriptions on File Prior to Visit  Medication Sig Dispense Refill  . aspirin 325 MG tablet Take 325 mg by mouth daily. 1/2 TABLET DAILY       . Calcium Carbonate (CALCIUM 600 PO) Take 600 mg by mouth daily.        . fish oil-omega-3 fatty acids 1000 MG capsule Take 1 g by mouth daily.        Marland Kitchen LORazepam (ATIVAN) 0.5 MG tablet Take 0.5 mg by mouth as needed.        . rosuvastatin (CRESTOR) 5 MG tablet Take 10 mg by mouth 3 (three) times a week.       . sertraline (ZOLOFT) 50 MG tablet Take 50 mg by mouth daily.        . Vitamin D, Ergocalciferol, (DRISDOL) 50000 UNITS CAPS Take 50,000 Units by mouth every 7 (seven) days.        . Zoledronic Acid (RECLAST IV) Inject into the vein. Taking 1 Time a Year         Allergies  Allergen Reactions  . Celexa   . Sulfonamide Derivatives     Past Medical History  Diagnosis Date  . PAF (paroxysmal atrial fibrillation)   . SSS (sick sinus syndrome)   . History of aortic insufficiency   . Anxiety   . Palpitations   . Hypertension     Past Surgical History  Procedure Date  . Insert / replace / remove pacemaker 03/16/2006  . Cataract extraction   . Cesarean section   .  Tonsillectomy   . US echocardiography 07/25/2006    EF 55-60%  . US echocardiography 04/27/2004    EF 55-60%  . Cardiovascular stress test 12/09/2004    History  Smoking status  . Never Smoker   Smokeless tobacco  . Not on file    History  Alcohol Use No    Family History  Problem Relation Age of Onset  . Emphysema Father     Review of Systems: As noted in history of present illness.  All other systems were reviewed and are negative.  Physical Exam: BP 156/70  Pulse 72  Ht 5\' 2"  (1.575 m)  Wt 122 lb (55.339 kg)  BMI 22.31 kg/m2 She is an elderly white female in no acute distress.The patient is alert and oriented x 3.  The mood and affect are normal.  The skin is warm and dry.  Color is normal.  The HEENT exam reveals that the sclera are nonicteric.  The mucous membranes are moist.  The carotids are 2+ without bruits.  There is no thyromegaly.  There is no JVD.  The lungs are clear.  The chest  wall is non tender.  The heart exam reveals a regular rate with a normal S1 and S2.  There are no murmurs, gallops, or rubs.  The PMI is not displaced.   Abdominal exam reveals good bowel sounds.  There is no guarding or rebound.  There is no hepatosplenomegaly or tenderness.  There are no masses.  Exam of the legs reveal no clubbing, cyanosis, or edema.  The legs are without rashes.  The distal pulses are intact.  Cranial nerves II - XII are intact.  Motor and sensory functions are intact.  The gait is normal. LABORATORY DATA:   Assessment / Plan:

## 2010-10-05 NOTE — Patient Instructions (Signed)
We will stop metoprolol and put you on carvedilol 12.5 mg twice a day.  Keep monitoring you blood pressure.  I will see you again in 3 months.

## 2010-10-05 NOTE — Assessment & Plan Note (Signed)
She is having more symptomatic palpitations. She is due for a pacemaker check next month. We can see at that time how much breakthrough arrhythmia she is having and whether this is atrial fibrillation. We need to consider possibly placing her on anticoagulant therapy given her history of hypertension and her age. I don't know that anticoagulation is ever been entertained before. I think some of her increased symptoms Wynn be related to her poor blood pressure control we will focus on that at this point.

## 2010-10-14 ENCOUNTER — Ambulatory Visit (INDEPENDENT_AMBULATORY_CARE_PROVIDER_SITE_OTHER): Payer: Medicare Other | Admitting: *Deleted

## 2010-10-14 ENCOUNTER — Other Ambulatory Visit: Payer: Self-pay

## 2010-10-14 ENCOUNTER — Encounter: Payer: Self-pay | Admitting: Internal Medicine

## 2010-10-14 DIAGNOSIS — I495 Sick sinus syndrome: Secondary | ICD-10-CM

## 2010-10-16 LAB — REMOTE PACEMAKER DEVICE
BATTERY VOLTAGE: 2.97 V
RV LEAD AMPLITUDE: 3.8368 mv

## 2010-10-22 NOTE — Progress Notes (Signed)
Pacer checked by remote 

## 2010-10-25 ENCOUNTER — Encounter: Payer: Self-pay | Admitting: *Deleted

## 2010-10-26 ENCOUNTER — Telehealth: Payer: Self-pay | Admitting: Cardiology

## 2010-10-26 NOTE — Telephone Encounter (Signed)
Called stating Carvedilol that Dr. [place] put her on 9/11 is causing her to be lightheaded. No other sx. Spoke w/Lori Gerhardt,NP and she advised that she should give the medication a couple more weeks. Advised if still having problems in 2 wks to call back

## 2010-10-26 NOTE — Telephone Encounter (Signed)
Pt calling re carvedilol , making her dizzy, pls advise

## 2010-11-05 ENCOUNTER — Other Ambulatory Visit: Payer: Self-pay | Admitting: *Deleted

## 2011-01-05 ENCOUNTER — Encounter: Payer: Self-pay | Admitting: Cardiology

## 2011-01-05 ENCOUNTER — Ambulatory Visit (INDEPENDENT_AMBULATORY_CARE_PROVIDER_SITE_OTHER): Payer: Medicare Other | Admitting: Cardiology

## 2011-01-05 VITALS — BP 120/52 | HR 60 | Resp 18 | Ht 62.0 in | Wt 120.0 lb

## 2011-01-05 DIAGNOSIS — I480 Paroxysmal atrial fibrillation: Secondary | ICD-10-CM

## 2011-01-05 DIAGNOSIS — I4891 Unspecified atrial fibrillation: Secondary | ICD-10-CM

## 2011-01-05 DIAGNOSIS — I10 Essential (primary) hypertension: Secondary | ICD-10-CM

## 2011-01-05 DIAGNOSIS — R002 Palpitations: Secondary | ICD-10-CM

## 2011-01-05 DIAGNOSIS — I495 Sick sinus syndrome: Secondary | ICD-10-CM

## 2011-01-05 NOTE — Assessment & Plan Note (Signed)
She has normal pacemaker function. She's had no significant tachycardia. Rare brief episodes of atrial fibrillation.

## 2011-01-05 NOTE — Progress Notes (Signed)
Jo [name] Date of Birth: [birth year]   History of Present Illness: Jo [name] is seen today for followup. She reports that with the change in her blood pressure medication that her blood pressure has been much better. She has had no episodes of tachycardia or palpitations. She does complain that an hour after taking her medication she gets lightheaded and her vision becomes blurry. She's had no falls or syncope.  Current Outpatient Prescriptions on File Prior to Visit  Medication Sig Dispense Refill  . aspirin 325 MG tablet Take 325 mg by mouth daily. 1/2 TABLET DAILY       . Calcium Carbonate (CALCIUM 600 PO) Take 600 mg by mouth daily.        . carvedilol (COREG) 12.5 MG tablet Take 1 tablet (12.5 mg total) by mouth 2 (two) times daily.  30 tablet  11  . fish oil-omega-3 fatty acids 1000 MG capsule Take 1 g by mouth daily.        Marland Kitchen LORazepam (ATIVAN) 0.5 MG tablet Take 0.5 mg by mouth as needed.        . rosuvastatin (CRESTOR) 5 MG tablet Take 5 mg by mouth 3 (three) times a week. Take 1/2 tablet every other day.      . sertraline (ZOLOFT) 50 MG tablet Take 50 mg by mouth daily.        . Vitamin D, Ergocalciferol, (DRISDOL) 50000 UNITS CAPS Take 50,000 Units by mouth every 7 (seven) days.        . Zoledronic Acid (RECLAST IV) Inject into the vein. Taking 1 Time a Year         Allergies  Allergen Reactions  . Celexa   . Sulfonamide Derivatives     Past Medical History  Diagnosis Date  . PAF (paroxysmal atrial fibrillation)   . SSS (sick sinus syndrome)   . History of aortic insufficiency   . Anxiety   . Palpitations   . Hypertension     Past Surgical History  Procedure Date  . Insert / replace / remove pacemaker 03/16/2006  . Cataract extraction   . Cesarean section   . Tonsillectomy   . US echocardiography 07/25/2006    EF 55-60%  . US echocardiography 04/27/2004    EF 55-60%  . Cardiovascular stress test 12/09/2004    History  Smoking status  . Never Smoker     Smokeless tobacco  . Not on file    History  Alcohol Use No    Family History  Problem Relation Age of Onset  . Emphysema Father     Review of Systems: As noted in history of present illness.  All other systems were reviewed and are negative.  Physical Exam: BP 120/52  Pulse 60  Resp 18  Ht 5\' 2"  (1.575 m)  Wt 120 lb (54.432 kg)  BMI 21.95 kg/m2 She is an elderly white female in no acute distress.The patient is alert and oriented x 3.  The mood and affect are normal.  The skin is warm and dry.  Color is normal.  The HEENT exam reveals that the sclera are nonicteric.  The mucous membranes are moist.  The carotids are 2+ without bruits.  There is no thyromegaly.  There is no JVD.  The lungs are clear.  The chest wall is non tender.  The heart exam reveals a regular rate with a normal S1 and S2.  There are no murmurs, gallops, or rubs.  The PMI is not displaced.  Abdominal exam reveals good bowel sounds.  There is no guarding or rebound.  There is no hepatosplenomegaly or tenderness.  There are no masses.  Exam of the legs reveal no clubbing, cyanosis, or edema.  The legs are without rashes.  The distal pulses are intact.  Cranial nerves II - XII are intact.  Motor and sensory functions are intact.  The gait is normal. LABORATORY DATA: Pacemaker evaluation today she is normal pacemaker function. Battery voltage is 2.97 V. She has had very infrequent episodes of atrial fibrillation. Her last sustained episode was in August of 2012 and lasted 20 minutes. She has had no recent ventricular high rate episodes.  Assessment / Plan:

## 2011-01-05 NOTE — Patient Instructions (Addendum)
Remote monitoring is used to monitor your Pacemaker of ICD from home. This monitoring reduces the number of office visits required to check your device to one time per year. It allows [place] to keep an eye on the functioning of your device to ensure it is working properly. You are scheduled for a device check from home on April 07, 2011. You Magro send your transmission at any time that day. If you have a wireless device, the transmission will be sent automatically. After your physician reviews your transmission, you will receive a postcard with your next transmission date.  Reduce your carvedilol to 6.25 mg twice a day. Half the current dose.  I will see you again in 3 months.

## 2011-01-05 NOTE — Assessment & Plan Note (Signed)
Given the paucity of atrial fibrillation noted on her pacemaker interrogation I would not recommend anticoagulation at this time in this 77 year old patient.

## 2011-01-05 NOTE — Assessment & Plan Note (Signed)
Blood pressure has improved significantly since we switched from metoprolol to carvedilol. She is having symptoms of lightheadedness and blurred vision related to her medication. We will reduce her carvedilol to 6.25 mg twice a day. I will followup again in 3 months.

## 2011-01-07 ENCOUNTER — Encounter: Payer: Medicare Other | Admitting: Internal Medicine

## 2011-02-10 DIAGNOSIS — N3000 Acute cystitis without hematuria: Secondary | ICD-10-CM | POA: Diagnosis not present

## 2011-02-10 DIAGNOSIS — N952 Postmenopausal atrophic vaginitis: Secondary | ICD-10-CM | POA: Diagnosis not present

## 2011-04-07 ENCOUNTER — Ambulatory Visit: Payer: Medicare Other | Admitting: Cardiology

## 2011-04-07 ENCOUNTER — Encounter: Payer: Self-pay | Admitting: Internal Medicine

## 2011-04-07 ENCOUNTER — Ambulatory Visit (INDEPENDENT_AMBULATORY_CARE_PROVIDER_SITE_OTHER): Payer: Medicare Other | Admitting: *Deleted

## 2011-04-07 DIAGNOSIS — I480 Paroxysmal atrial fibrillation: Secondary | ICD-10-CM

## 2011-04-07 DIAGNOSIS — I495 Sick sinus syndrome: Secondary | ICD-10-CM

## 2011-04-07 DIAGNOSIS — I4891 Unspecified atrial fibrillation: Secondary | ICD-10-CM

## 2011-04-08 LAB — REMOTE PACEMAKER DEVICE
AL AMPLITUDE: 2.4045 mv
AL IMPEDENCE PM: 528 Ohm
BATTERY VOLTAGE: 2.97 V
RV LEAD AMPLITUDE: 4.5344 mv

## 2011-04-11 DIAGNOSIS — N952 Postmenopausal atrophic vaginitis: Secondary | ICD-10-CM | POA: Diagnosis not present

## 2011-04-11 DIAGNOSIS — N3000 Acute cystitis without hematuria: Secondary | ICD-10-CM | POA: Diagnosis not present

## 2011-04-12 ENCOUNTER — Ambulatory Visit (INDEPENDENT_AMBULATORY_CARE_PROVIDER_SITE_OTHER): Payer: Medicare Other | Admitting: Cardiology

## 2011-04-12 ENCOUNTER — Encounter: Payer: Self-pay | Admitting: *Deleted

## 2011-04-12 ENCOUNTER — Encounter: Payer: Self-pay | Admitting: Cardiology

## 2011-04-12 VITALS — BP 124/60 | HR 74 | Ht 62.0 in | Wt 124.0 lb

## 2011-04-12 DIAGNOSIS — I495 Sick sinus syndrome: Secondary | ICD-10-CM | POA: Diagnosis not present

## 2011-04-12 DIAGNOSIS — I10 Essential (primary) hypertension: Secondary | ICD-10-CM

## 2011-04-12 NOTE — Assessment & Plan Note (Signed)
She continues to do very well without any symptoms of palpitations. Her last pacemaker check showed no significant atrial high rate episodes. Since she has had no documented recurrence of atrial fibrillation we will forego anticoagulant therapy. She will remain on aspirin.

## 2011-04-12 NOTE — Assessment & Plan Note (Signed)
Blood pressure appears to be well controlled on her current regimen.

## 2011-04-12 NOTE — Patient Instructions (Signed)
Continue your current medication.  I will see you again in 6 months.   

## 2011-04-12 NOTE — Progress Notes (Signed)
Jo [name] Date of Birth: [birth year]   History of Present Illness: Jo [name] is seen today for followup. She reports that she is doing very well from a cardiac standpoint. She has had no palpitations, shortness of breath, or chest pain. She's had no dizziness. She brings in a number of blood pressure readings which have generally been under good control but she will occasionally get an elevated systolic reading to 160. Her major complaint today is of recurrent cystitis for which she is seeing urology.  Current Outpatient Prescriptions on File Prior to Visit  Medication Sig Dispense Refill  . aspirin 325 MG tablet Take 325 mg by mouth daily. 1/2 TABLET DAILY       . Calcium Carbonate (CALCIUM 600 PO) Take 600 mg by mouth daily.        . carvedilol (COREG) 12.5 MG tablet Take 1 tablet (12.5 mg total) by mouth 2 (two) times daily.  30 tablet  11  . fish oil-omega-3 fatty acids 1000 MG capsule Take 1 g by mouth daily.        Marland Kitchen LORazepam (ATIVAN) 0.5 MG tablet Take 0.5 mg by mouth as needed.        . rosuvastatin (CRESTOR) 5 MG tablet Take 5 mg by mouth 3 (three) times a week. Take 1/2 tablet every other day.      . sertraline (ZOLOFT) 50 MG tablet Take 50 mg by mouth daily.        . Vitamin D, Ergocalciferol, (DRISDOL) 50000 UNITS CAPS Take 50,000 Units by mouth every 7 (seven) days.        . Zoledronic Acid (RECLAST IV) Inject into the vein. Taking 1 Time a Year         Allergies  Allergen Reactions  . Celexa   . Sulfonamide Derivatives     Past Medical History  Diagnosis Date  . PAF (paroxysmal atrial fibrillation)   . SSS (sick sinus syndrome)   . History of aortic insufficiency   . Anxiety   . Palpitations   . Hypertension     Past Surgical History  Procedure Date  . Insert / replace / remove pacemaker 03/16/2006  . Cataract extraction   . Cesarean section   . Tonsillectomy   . US echocardiography 07/25/2006    EF 55-60%  . US echocardiography 04/27/2004    EF 55-60%  .  Cardiovascular stress test 12/09/2004    History  Smoking status  . Never Smoker   Smokeless tobacco  . Not on file    History  Alcohol Use No    Family History  Problem Relation Age of Onset  . Emphysema Father     Review of Systems: As noted in history of present illness.  All other systems were reviewed and are negative.  Physical Exam: BP 124/60  Pulse 74  Ht 5\' 2"  (1.575 m)  Wt 56.246 kg (124 lb)  BMI 22.68 kg/m2 She is an elderly white female in no acute distress.The patient is alert and oriented x 3.  The mood and affect are normal.  The skin is warm and dry.  Color is normal.  The HEENT exam reveals that the sclera are nonicteric.  The mucous membranes are moist.  The carotids are 2+ without bruits.  There is no thyromegaly.  There is no JVD.  The lungs are clear.  The chest wall is non tender.  The heart exam reveals a regular rate with a normal S1 and S2.  There  are no murmurs, gallops, or rubs.  The PMI is not displaced.   Abdominal exam reveals good bowel sounds.  There is no guarding or rebound.  There is no hepatosplenomegaly or tenderness.  There are no masses.  Exam of the legs reveal no clubbing, cyanosis, or edema.  The legs are without rashes.  The distal pulses are intact.  Cranial nerves II - XII are intact.  Motor and sensory functions are intact.  The gait is normal. LABORATORY DATA: ECG today demonstrates an atrial paced rhythm, otherwise normal.  Assessment / Plan:

## 2011-04-21 NOTE — Progress Notes (Signed)
Remote pacer check  

## 2011-05-02 DIAGNOSIS — Z79899 Other long term (current) drug therapy: Secondary | ICD-10-CM | POA: Diagnosis not present

## 2011-05-02 DIAGNOSIS — E785 Hyperlipidemia, unspecified: Secondary | ICD-10-CM | POA: Diagnosis not present

## 2011-05-06 DIAGNOSIS — I10 Essential (primary) hypertension: Secondary | ICD-10-CM | POA: Diagnosis not present

## 2011-05-06 DIAGNOSIS — E785 Hyperlipidemia, unspecified: Secondary | ICD-10-CM | POA: Diagnosis not present

## 2011-05-06 DIAGNOSIS — I4891 Unspecified atrial fibrillation: Secondary | ICD-10-CM | POA: Diagnosis not present

## 2011-05-06 DIAGNOSIS — E119 Type 2 diabetes mellitus without complications: Secondary | ICD-10-CM | POA: Diagnosis not present

## 2011-06-13 DIAGNOSIS — N952 Postmenopausal atrophic vaginitis: Secondary | ICD-10-CM | POA: Diagnosis not present

## 2011-06-13 DIAGNOSIS — N3000 Acute cystitis without hematuria: Secondary | ICD-10-CM | POA: Diagnosis not present

## 2011-07-14 ENCOUNTER — Ambulatory Visit (INDEPENDENT_AMBULATORY_CARE_PROVIDER_SITE_OTHER): Payer: Medicare Other | Admitting: *Deleted

## 2011-07-14 ENCOUNTER — Encounter: Payer: Self-pay | Admitting: Internal Medicine

## 2011-07-14 DIAGNOSIS — T82111A Breakdown (mechanical) of cardiac pulse generator (battery), initial encounter: Secondary | ICD-10-CM

## 2011-07-14 DIAGNOSIS — I4891 Unspecified atrial fibrillation: Secondary | ICD-10-CM

## 2011-07-20 DIAGNOSIS — R52 Pain, unspecified: Secondary | ICD-10-CM | POA: Diagnosis not present

## 2011-07-20 DIAGNOSIS — I499 Cardiac arrhythmia, unspecified: Secondary | ICD-10-CM | POA: Diagnosis not present

## 2011-07-20 DIAGNOSIS — M542 Cervicalgia: Secondary | ICD-10-CM | POA: Diagnosis not present

## 2011-07-20 LAB — REMOTE PACEMAKER DEVICE
AL AMPLITUDE: 2.449 mv
AL IMPEDENCE PM: 552 Ohm
BATTERY VOLTAGE: 2.96 V
VENTRICULAR PACING PM: 7.8

## 2011-07-21 ENCOUNTER — Encounter (HOSPITAL_COMMUNITY): Payer: Self-pay | Admitting: Emergency Medicine

## 2011-07-21 ENCOUNTER — Encounter: Payer: Self-pay | Admitting: *Deleted

## 2011-07-21 ENCOUNTER — Inpatient Hospital Stay (HOSPITAL_COMMUNITY)
Admission: EM | Admit: 2011-07-21 | Discharge: 2011-07-23 | DRG: 310 | Disposition: A | Discharge status: Home / Self Care | Payer: Medicare Other | Attending: Internal Medicine | Admitting: Internal Medicine

## 2011-07-21 ENCOUNTER — Emergency Department (HOSPITAL_COMMUNITY): Payer: Medicare Other

## 2011-07-21 DIAGNOSIS — M129 Arthropathy, unspecified: Secondary | ICD-10-CM | POA: Diagnosis present

## 2011-07-21 DIAGNOSIS — Z9849 Cataract extraction status, unspecified eye: Secondary | ICD-10-CM

## 2011-07-21 DIAGNOSIS — I495 Sick sinus syndrome: Secondary | ICD-10-CM | POA: Diagnosis not present

## 2011-07-21 DIAGNOSIS — I4892 Unspecified atrial flutter: Secondary | ICD-10-CM | POA: Diagnosis present

## 2011-07-21 DIAGNOSIS — I4891 Unspecified atrial fibrillation: Principal | ICD-10-CM | POA: Diagnosis present

## 2011-07-21 DIAGNOSIS — Z961 Presence of intraocular lens: Secondary | ICD-10-CM

## 2011-07-21 DIAGNOSIS — F419 Anxiety disorder, unspecified: Secondary | ICD-10-CM

## 2011-07-21 DIAGNOSIS — R000 Tachycardia, unspecified: Secondary | ICD-10-CM | POA: Diagnosis not present

## 2011-07-21 DIAGNOSIS — E119 Type 2 diabetes mellitus without complications: Secondary | ICD-10-CM | POA: Diagnosis present

## 2011-07-21 DIAGNOSIS — I359 Nonrheumatic aortic valve disorder, unspecified: Secondary | ICD-10-CM | POA: Diagnosis present

## 2011-07-21 DIAGNOSIS — Z7982 Long term (current) use of aspirin: Secondary | ICD-10-CM

## 2011-07-21 DIAGNOSIS — I10 Essential (primary) hypertension: Secondary | ICD-10-CM | POA: Diagnosis present

## 2011-07-21 DIAGNOSIS — Z85828 Personal history of other malignant neoplasm of skin: Secondary | ICD-10-CM | POA: Diagnosis not present

## 2011-07-21 DIAGNOSIS — T82111A Breakdown (mechanical) of cardiac pulse generator (battery), initial encounter: Secondary | ICD-10-CM

## 2011-07-21 DIAGNOSIS — F3289 Other specified depressive episodes: Secondary | ICD-10-CM | POA: Diagnosis present

## 2011-07-21 DIAGNOSIS — I480 Paroxysmal atrial fibrillation: Secondary | ICD-10-CM

## 2011-07-21 DIAGNOSIS — R918 Other nonspecific abnormal finding of lung field: Secondary | ICD-10-CM | POA: Diagnosis not present

## 2011-07-21 DIAGNOSIS — F410 Panic disorder [episodic paroxysmal anxiety] without agoraphobia: Secondary | ICD-10-CM

## 2011-07-21 DIAGNOSIS — Z79899 Other long term (current) drug therapy: Secondary | ICD-10-CM | POA: Diagnosis not present

## 2011-07-21 DIAGNOSIS — Z45018 Encounter for adjustment and management of other part of cardiac pacemaker: Secondary | ICD-10-CM

## 2011-07-21 DIAGNOSIS — F411 Generalized anxiety disorder: Secondary | ICD-10-CM | POA: Diagnosis present

## 2011-07-21 DIAGNOSIS — T82190A Other mechanical complication of cardiac electrode, initial encounter: Secondary | ICD-10-CM | POA: Diagnosis not present

## 2011-07-21 DIAGNOSIS — F329 Major depressive disorder, single episode, unspecified: Secondary | ICD-10-CM | POA: Diagnosis present

## 2011-07-21 DIAGNOSIS — R002 Palpitations: Secondary | ICD-10-CM | POA: Diagnosis not present

## 2011-07-21 DIAGNOSIS — Z8679 Personal history of other diseases of the circulatory system: Secondary | ICD-10-CM

## 2011-07-21 HISTORY — DX: Unspecified malignant neoplasm of skin, unspecified: C44.90

## 2011-07-21 HISTORY — DX: Unspecified osteoarthritis, unspecified site: M19.90

## 2011-07-21 HISTORY — DX: Major depressive disorder, single episode, unspecified: F32.9

## 2011-07-21 HISTORY — DX: Depression, unspecified: F32.A

## 2011-07-21 HISTORY — DX: Panic disorder (episodic paroxysmal anxiety): F41.0

## 2011-07-21 HISTORY — DX: Type 2 diabetes mellitus without complications: E11.9

## 2011-07-21 LAB — COMPREHENSIVE METABOLIC PANEL
ALT: 19 U/L (ref 0–35)
AST: 23 U/L (ref 0–37)
Calcium: 9.9 mg/dL (ref 8.4–10.5)
Sodium: 139 mEq/L (ref 135–145)
Total Protein: 6.8 g/dL (ref 6.0–8.3)

## 2011-07-21 LAB — POCT I-STAT, CHEM 8
BUN: 20 mg/dL (ref 6–23)
Creatinine, Ser: 0.8 mg/dL (ref 0.50–1.10)
Potassium: 3.7 mEq/L (ref 3.5–5.1)
Sodium: 141 mEq/L (ref 135–145)

## 2011-07-21 LAB — CBC
HCT: 36 % (ref 36.0–46.0)
Hemoglobin: 12.1 g/dL (ref 12.0–15.0)
MCH: 30.3 pg (ref 26.0–34.0)
MCH: 30.8 pg (ref 26.0–34.0)
MCHC: 32.7 g/dL (ref 30.0–36.0)
MCHC: 33.6 g/dL (ref 30.0–36.0)
Platelets: 157 10*3/uL (ref 150–400)
RBC: 4.29 MIL/uL (ref 3.87–5.11)

## 2011-07-21 LAB — BASIC METABOLIC PANEL
BUN: 19 mg/dL (ref 6–23)
Chloride: 109 mEq/L (ref 96–112)
GFR calc Af Amer: 87 mL/min — ABNORMAL LOW (ref >90)
Glucose, Bld: 119 mg/dL — ABNORMAL HIGH (ref 70–99)
Potassium: 3.8 mEq/L (ref 3.5–5.1)

## 2011-07-21 MED ORDER — LORAZEPAM 0.5 MG PO TABS: 0.5000 mg | ORAL_TABLET | ORAL | Status: DC | PRN

## 2011-07-21 MED ORDER — ALUM & MAG HYDROXIDE-SIMETH 200-200-20 MG/5ML PO SUSP: 30.0000 mL | Freq: Four times a day (QID) | ORAL | Status: DC | PRN

## 2011-07-21 MED ORDER — VITAMIN D (ERGOCALCIFEROL) 1.25 MG (50000 UNIT) PO CAPS: 50000.0000 [IU] | ORAL_CAPSULE | ORAL | Status: DC

## 2011-07-21 MED ORDER — OFF THE BEAT BOOK
Freq: Once | Status: AC
  Administered 2011-07-21: 12:00:00
  Filled 2011-07-21: qty 1

## 2011-07-21 MED ORDER — OMEGA-3-ACID ETHYL ESTERS 1 G PO CAPS
1.0000 g | ORAL_CAPSULE | Freq: Every day | ORAL | Status: DC
  Administered 2011-07-21 – 2011-07-23 (×3): 1 g via ORAL
  Filled 2011-07-21 (×4): qty 1

## 2011-07-21 MED ORDER — ATORVASTATIN CALCIUM 10 MG PO TABS
10.0000 mg | ORAL_TABLET | Freq: Every day | ORAL | Status: DC
  Administered 2011-07-21 – 2011-07-22 (×2): 10 mg via ORAL
  Filled 2011-07-21 (×3): qty 1

## 2011-07-21 MED ORDER — HEPARIN SODIUM (PORCINE) 5000 UNIT/ML IJ SOLN
5000.0000 [IU] | Freq: Three times a day (TID) | INTRAMUSCULAR | Status: DC
  Administered 2011-07-21: 5000 [IU] via SUBCUTANEOUS
  Filled 2011-07-21 (×3): qty 1

## 2011-07-21 MED ORDER — ACETAMINOPHEN 650 MG RE SUPP: 650.0000 mg | Freq: Four times a day (QID) | RECTAL | Status: DC | PRN

## 2011-07-21 MED ORDER — SODIUM CHLORIDE 0.9 % IV SOLN
INTRAVENOUS | Status: DC
  Administered 2011-07-21: 01:00:00 via INTRAVENOUS

## 2011-07-21 MED ORDER — SERTRALINE HCL 50 MG PO TABS
50.0000 mg | ORAL_TABLET | Freq: Every day | ORAL | Status: DC
  Administered 2011-07-21 – 2011-07-23 (×3): 50 mg via ORAL
  Filled 2011-07-21 (×3): qty 1

## 2011-07-21 MED ORDER — ASPIRIN 325 MG PO TABS: 162.0000 mg | ORAL_TABLET | Freq: Once | ORAL | Status: DC

## 2011-07-21 MED ORDER — ENOXAPARIN SODIUM 40 MG/0.4ML ~~LOC~~ SOLN
40.0000 mg | SUBCUTANEOUS | Status: DC
  Filled 2011-07-21 (×2): qty 0.4

## 2011-07-21 MED ORDER — CARVEDILOL 3.125 MG PO TABS
3.1250 mg | ORAL_TABLET | Freq: Two times a day (BID) | ORAL | Status: DC
  Administered 2011-07-21 – 2011-07-23 (×5): 3.125 mg via ORAL
  Filled 2011-07-21 (×8): qty 1

## 2011-07-21 MED ORDER — ESTRADIOL 0.1 MG/GM VA CREA
2.0000 g | TOPICAL_CREAM | VAGINAL | Status: DC
  Administered 2011-07-22: 0.25 via VAGINAL
  Filled 2011-07-21: qty 42.5

## 2011-07-21 MED ORDER — ONDANSETRON HCL 4 MG/2ML IJ SOLN: 4.0000 mg | Freq: Four times a day (QID) | INTRAMUSCULAR | Status: DC | PRN

## 2011-07-21 MED ORDER — ACETAMINOPHEN 325 MG PO TABS: 650.0000 mg | ORAL_TABLET | Freq: Four times a day (QID) | ORAL | Status: DC | PRN

## 2011-07-21 MED ORDER — DIGOXIN 0.25 MG/ML IJ SOLN
0.2500 mg | Freq: Once | INTRAMUSCULAR | Status: AC
  Administered 2011-07-21: 0.25 mg via INTRAVENOUS
  Filled 2011-07-21: qty 1

## 2011-07-21 MED ORDER — DILTIAZEM HCL 100 MG IV SOLR
5.0000 mg/h | INTRAVENOUS | Status: DC
  Administered 2011-07-21: 5 mg/h via INTRAVENOUS

## 2011-07-21 MED ORDER — ASPIRIN 325 MG PO TABS
162.5000 mg | ORAL_TABLET | Freq: Every day | ORAL | Status: DC
  Filled 2011-07-21: qty 0.5

## 2011-07-21 MED ORDER — DOCUSATE SODIUM 100 MG PO CAPS
100.0000 mg | ORAL_CAPSULE | Freq: Two times a day (BID) | ORAL | Status: DC
  Administered 2011-07-21 – 2011-07-23 (×5): 100 mg via ORAL
  Filled 2011-07-21 (×6): qty 1

## 2011-07-21 MED ORDER — DILTIAZEM HCL 100 MG IV SOLR
5.0000 mg/h | INTRAVENOUS | Status: DC
  Administered 2011-07-21: 5 mg/h via INTRAVENOUS
  Filled 2011-07-21 (×2): qty 100

## 2011-07-21 MED ORDER — ONDANSETRON HCL 4 MG PO TABS: 4.0000 mg | ORAL_TABLET | Freq: Four times a day (QID) | ORAL | Status: DC | PRN

## 2011-07-21 MED ORDER — LORAZEPAM 0.5 MG PO TABS
0.5000 mg | ORAL_TABLET | ORAL | Status: DC | PRN
  Administered 2011-07-21 – 2011-07-22 (×2): 0.5 mg via ORAL
  Filled 2011-07-21 (×2): qty 1

## 2011-07-21 NOTE — ED Notes (Signed)
Pt stating that she has had some jaw tightness and not feeling well for 2 weeks on and off. Pt stated that today she started feeling worse.

## 2011-07-21 NOTE — ED Notes (Addendum)
Pt resting awaiting admission, family at bedside

## 2011-07-21 NOTE — Care Management Note (Unsigned)
    Page 1 of 1   07/21/2011     12:05:09 PM   CARE MANAGEMENT NOTE 07/21/2011  Patient:  Jo [name], Jo [name]   Account Number:  [id number]  Date Initiated:  07/21/2011  Documentation initiated by:  GRAVES-BIGELOW,Suraj Ramdass  Subjective/Objective Assessment:   Pt admitted with afib. Placed on IV cardizem gtt. Pt has family support in room at bedside. Unable to speak to pt at this time.     Action/Plan:   CM will continue to f/u for disposition needs.   Anticipated DC Date:  07/24/2011   Anticipated DC Plan:  HOME W HOME HEALTH SERVICES      DC Planning Services  CM consult      Choice offered to / List presented to:             Status of service:  In process, will continue to follow Medicare Important Message given?   (If response is "NO", the following Medicare IM given date fields will be blank) Date Medicare IM given:   Date Additional Medicare IM given:    Discharge Disposition:    Per UR Regulation:  Reviewed for med. necessity/level of care/duration of stay  If discussed at Long Length of Stay Meetings, dates discussed:    Comments:

## 2011-07-21 NOTE — ED Provider Notes (Signed)
History     CSN: [id number]  Arrival date & time 07/21/11  0005   First MD Initiated Contact with Patient 07/21/11 0010      Chief Complaint  Patient presents with  . Tachycardia    (Consider location/radiation/quality/duration/timing/severity/associated sxs/prior treatment) HPI History provided by patient and daughter bedside. Palpitations onset tonight with associated jaw pain. No shortness of breath. No recent illness, cough or fevers. Patient did speak to cardiology office today regarding her pacemaker and was told that it was in the incorrect mode.  Has history of sick sinus.  Patient scheduled for pacemaker adjustment in the afternoon. Symptoms developed after this phone call.  No change in medications. No chest pain. Denies history of similar symptoms in the past. No known aggravating or alleviating factors. Moderate in severity. Past Medical History  Diagnosis Date  . PAF (paroxysmal atrial fibrillation)   . SSS (sick sinus syndrome)   . History of aortic insufficiency   . Anxiety   . Palpitations   . Hypertension     Past Surgical History  Procedure Date  . Insert / replace / remove pacemaker 03/16/2006  . Cataract extraction   . Cesarean section   . Tonsillectomy   . US echocardiography 07/25/2006    EF 55-60%  . US echocardiography 04/27/2004    EF 55-60%  . Cardiovascular stress test 12/09/2004    Family History  Problem Relation Age of Onset  . Emphysema Father     History  Substance Use Topics  . Smoking status: Never Smoker   . Smokeless tobacco: Not on file  . Alcohol Use: No    OB History    Grav Para Term Preterm Abortions TAB SAB Ect Mult Living                  Review of Systems  Constitutional: Negative for fever and chills.  HENT: Negative for neck pain and neck stiffness.   Eyes: Negative for pain.  Respiratory: Negative for shortness of breath.   Cardiovascular: Positive for palpitations. Negative for leg swelling.  Gastrointestinal:  Negative for abdominal pain.  Genitourinary: Negative for dysuria.  Musculoskeletal: Negative for back pain.  Skin: Negative for rash.  Neurological: Negative for headaches.  All other systems reviewed and are negative.    Allergies  Citalopram hydrobromide and Sulfonamide derivatives  Home Medications   Current Outpatient Rx  Name Route Sig Dispense Refill  . ASPIRIN 325 MG PO TABS Oral Take 162.5 mg by mouth daily. 1/2 TABLET DAILY    . CALCIUM 600 PO Oral Take 600 mg by mouth daily.      Marland Kitchen CARVEDILOL 6.25 MG PO TABS Oral Take 3.125 mg by mouth 2 (two) times daily with a meal.    . ESTRADIOL 0.1 MG/GM VA CREA Vaginal Place 2 g vaginally 3 (three) times a week. Monday, Wednesday, friday    . OMEGA-3 FATTY ACIDS 1000 MG PO CAPS Oral Take 1 g by mouth daily.      Marland Kitchen LORAZEPAM 0.5 MG PO TABS Oral Take 0.5 mg by mouth as needed. For anxiety    . ROSUVASTATIN CALCIUM 5 MG PO TABS Oral Take 2.5 mg by mouth daily.    . SERTRALINE HCL 50 MG PO TABS Oral Take 50 mg by mouth daily.      Marland Kitchen VITAMIN D (ERGOCALCIFEROL) 50000 UNITS PO CAPS Oral Take 50,000 Units by mouth every 7 (seven) days. wednesday    . RECLAST IV Intravenous Inject into the vein. Taking  1 Time a Year       BP 174/94  Pulse 170  Temp 98 F (36.7 C) (Oral)  Resp 21  Ht 5\' 4"  (1.626 m)  Wt 125 lb (56.7 kg)  BMI 21.46 kg/m2  SpO2 91%  Physical Exam  Constitutional: She is oriented to person, place, and time. She appears well-developed and well-nourished.  HENT:  Head: Normocephalic and atraumatic.  Eyes: Conjunctivae and EOM are normal. Pupils are equal, round, and reactive to light.  Neck: Trachea normal. Neck supple. No thyromegaly present.  Cardiovascular: S1 normal, S2 normal and normal pulses.     No systolic murmur is present   No diastolic murmur is present  Pulses:      Radial pulses are 2+ on the right side, and 2+ on the left side.       Rapid heart rate irregularly irregular  Pulmonary/Chest: Effort  normal and breath sounds normal. She has no wheezes. She has no rhonchi. She has no rales. She exhibits no tenderness.  Abdominal: Soft. Normal appearance and bowel sounds are normal. There is no tenderness. There is no CVA tenderness and negative Murphy's sign.  Musculoskeletal:       BLE:s Calves nontender, no cords or erythema, negative Homans sign  Neurological: She is alert and oriented to person, place, and time. She has normal strength. No cranial nerve deficit or sensory deficit. GCS eye subscore is 4. GCS verbal subscore is 5. GCS motor subscore is 6.  Skin: Skin is warm and dry. No rash noted. She is not diaphoretic.  Psychiatric: Her speech is normal.       Cooperative and appropriate    ED Course  Procedures (including critical care time)   Labs Reviewed  CBC  COMPREHENSIVE METABOLIC PANEL     Date: 07/21/2011  Rate: 157  Rhythm: atrial fibrillation  QRS Axis: normal  Intervals: normal  ST/T Wave abnormalities: nonspecific ST/T changes  Conduction Disutrbances:none  Narrative Interpretation: old ECG 03-16-06 PACED rate 60  Old EKG Reviewed: changes noted  12:33 AM d/w Dr Antoine Poche, on call for [place] - pacer does not need to be reset tonight. Plan rate control and ED work up.  CRITICAL CARE Performed by: Sunnie Nielsen   Total critical care time: 45  Critical care time was exclusive of separately billable procedures and treating other patients.  Critical care was necessary to treat or prevent imminent or life-threatening deterioration.  Critical care was time spent personally by me on the following activities: development of treatment plan with patient and/or surrogate as well as nursing, discussions with consultants, evaluation of patient's response to treatment, examination of patient, obtaining history from patient or surrogate, ordering and performing treatments and interventions, ordering and review of laboratory studies, ordering and review of radiographic  studies, pulse oximetry and re-evaluation of patient's condition.  Iv cardizem drip after bolus initiated for rate control. CAR consult.   After Cardizem drip, she converted to sinus and dropped blood pressure into the 90s. Small IV fluid bolus and stopped Cardizem.  Intermittent pacing on the monitor present. Repeat EKG demonstrates AV dissociation as below   Date: 07/21/2011  Rate: 65  Rhythm: Third degree AV block  QRS Axis: normal  Intervals: normal  ST/T Wave abnormalities: nonspecific ST/T changes  Conduction Disutrbances:nonspecific intraventricular conduction delay  Narrative Interpretation:   Old EKG Reviewed: changes noted  Cardiology consult again. Dr. Antoine Poche reviewed ECG and feels medicine can admit patient and have pacemaker evaluated tomorrow.  Case discussed as  above with [facility] on-call who agrees to admission.   Jaw pain resolved and on monitor appears to be in sinus with normal rate and no further third degree AV block. Patient asymptomatic plan medical admission. Blood pressure normalizing. MDM   Palpitations and jaw pain presenting with rapid atrial fibrillation, improved with Cardizem drip. Sick sinus with intermittent pacing. MED admission with cardiology to follow.         Sunnie Nielsen, MD 07/21/11 [phone]

## 2011-07-21 NOTE — ED Notes (Signed)
Pt resting comfortably

## 2011-07-21 NOTE — Progress Notes (Signed)
Patient's HR returned to the 80's in Atrial fib and is currently V-pacing on demand. This occurred before the IV Cardizem could be administered. Patient is still asymptomatic. Will continue to monitor.  Harless Litten, RN 07/21/11

## 2011-07-21 NOTE — Progress Notes (Signed)
   ELECTROPHYSIOLOGY ROUNDING NOTE    Patient Name: Keyonda Bickle Remillard Date of Encounter: 07-21-2011   Patient with a [employer] pacemaker that is at elective replacement indicator.  Her device has reverted to VVI 65.  She is scheduled for pacemaker generator change on 08-03-2011.  Dennis Bast, RN at the office is advising daughter of plans and instructions.  Will have [employer] interrogate device today and reprogram to previous settings.  Dr Johney Frame aware of patient.    Signed, Gypsy Balsam RN, BSN [phone]- pager  I have seen, examined the patient, and reviewed the above assessment and plan.  Changes to above are made where necessary.    Co Sign: Hillis Range, MD

## 2011-07-21 NOTE — Progress Notes (Signed)
Patient converted to Sinus Rhythm per monitor strip @ 1939. Patient in Sinus Rhythm per EKG @ 1954, and rhythm and HR in the 70-80's has sustained. Patient's Vitals T 98.3 P 71 R 16 BP 171/77 02 97 3L N/C. Patient comfortably resting. [facility] [place] on call notified and made aware. Will continue to monitor patient.

## 2011-07-21 NOTE — Progress Notes (Addendum)
[facility] PROGRESS NOTE  Jo [name] AVW:[id number] DOB: [birth year] DOA: 07/21/2011 PCP: Ezequiel Kayser, MD  Assessment/Plan: 1. Afib with RVR - patient has intermittent episodes of afib with HR into the 180s. Patient does not have chest pain with the episodes. She has no dyspnea. She has not been on anticoagulation up to now but we Carriere need to reconsider. Patient was prescribed Cardizem but did not receive any prior to converting spontaneously to NSR. Coreg 3.125 resumed. Given one dose of iv digoxin. Start Cardizem drip  2. Panic / anxiety attacks - resumed ativan   3. S/p pacemaker - scheduled for battery replacement 08/03/11  Principal Problem:  *Atrial fibrillation with RVR Active Problems:  PAF (paroxysmal atrial fibrillation)  SSS (sick sinus syndrome)  History of aortic insufficiency  Hypertension  Pacemaker malfunction  Type II diabetes mellitus  Panic attacks  Anxiety  Code Status: full Family Communication: son Disposition Plan: home  Kolden Dupee, MD  [facility] Pager [phone]  If 7PM-7AM, please contact night-coverage [email or web address] Password [email or web address] 07/21/2011, 5:58 PM   LOS: 0 days   HPI/Subjective: Palpitations   Objective: Filed Vitals:   07/21/11 0500 07/21/11 0540 07/21/11 0654 07/21/11 1400  BP: 161/77 179/107 179/119 156/77  Pulse: 82 77 149 76  Temp:    98.2 F (36.8 C)  TempSrc:    Oral  Resp: 18 22 18 18   Height:      Weight:      SpO2: 93% 91% 94% 90%    Intake/Output Summary (Last 24 hours) at 07/21/11 1758 Last data filed at 07/21/11 0104  Gross per 24 hour  Intake      0 ml  Output    200 ml  Net   -200 ml    Exam:   General:  axox3  Cardiovascular: irreg irreg, no murmurs   Respiratory: ctab  Abdomen: soft, nt   Data Reviewed: Basic Metabolic Panel:  Lab 07/21/11 3086 07/21/11 0110 07/21/11 0029  NA 142 141 139  K 3.8 3.7 3.7  CL 109 104 103  CO2 25 -- 24  GLUCOSE 119* 188* 194*  BUN 19 20 21     CREATININE 0.64 0.80 0.70  CALCIUM 9.2 -- 9.9  MG -- -- --  PHOS -- -- --   Liver Function Tests:  Lab 07/21/11 0029  AST 23  ALT 19  ALKPHOS 57  BILITOT 0.4  PROT 6.8  ALBUMIN 3.8   No results found for this basename: LIPASE:5,AMYLASE:5 in the last 168 hours No results found for this basename: AMMONIA:5 in the last 168 hours CBC:  Lab 07/21/11 0500 07/21/11 0110 07/21/11 0029  WBC 10.0 -- 9.7  NEUTROABS -- -- --  HGB 12.1 13.3 13.0  HCT 36.0 39.0 39.7  MCV 91.6 -- 92.5  PLT 152 -- 157   Cardiac Enzymes: No results found for this basename: CKTOTAL:5,CKMB:5,CKMBINDEX:5,TROPONINI:5 in the last 168 hours BNP (last 3 results) No results found for this basename: PROBNP:3 in the last 8760 hours CBG: No results found for this basename: GLUCAP:5 in the last 168 hours  No results found for this or any previous visit (from the past 240 hour(s)).   Studies: Dg Chest Portable 1 View  07/21/2011  *RADIOLOGY REPORT*  Clinical Data: Tachycardia.  PORTABLE CHEST - 1 VIEW  Comparison: 03/17/2006  Findings: Stable appearance of cardiac pacemaker.  Shallow inspiration.  Borderline heart size with normal pulmonary vascularity.  Interstitial changes in the lungs likely to represent fibrosis.  No focal airspace  consolidation.  No blunting of costophrenic angles.  No pneumothorax.  Calcification of the aorta. Degenerative changes in the thoracic spine.  No significant change since previous study.  IMPRESSION: No evidence of active pulmonary disease.  Original Report Authenticated By: Marlon Pel, M.D.    Scheduled Meds:    . aspirin  162.5 mg Oral Daily  . atorvastatin  10 mg Oral q1800  . carvedilol  3.125 mg Oral BID WC  . digoxin  0.25 mg Intravenous Once  . docusate sodium  100 mg Oral BID  . enoxaparin (LOVENOX) injection  40 mg Subcutaneous Q24H  . estradiol  2 g Vaginal 3 times weekly  . off the beat book   Does not apply Once  . omega-3 acid ethyl esters  1 g Oral Q  breakfast  . sertraline  50 mg Oral Daily  . Vitamin D (Ergocalciferol)  50,000 Units Oral Q Wed  . DISCONTD: aspirin  162 mg Oral Once  . DISCONTD: heparin  5,000 Units Subcutaneous Q8H   Continuous Infusions:    . DISCONTD: sodium chloride 125 mL/hr at 07/21/11 0053  . DISCONTD: diltiazem (CARDIZEM) infusion Stopped (07/21/11 0105)

## 2011-07-21 NOTE — Progress Notes (Addendum)
Patient arrived to floor from ED in Afib RVR in 140-150's. BP 179/119. ED MD Dierdre Highman) paged for orders until provider specified. Received orders to give 10mg  bolus of Cardizem IV then discontinue without titrating. Requested medication from pharmacy. Patient asymptomatic at the moment. Will continue to monitor.  Harless Litten, RN 07/21/11

## 2011-07-21 NOTE — ED Notes (Signed)
Report called and pt transported to the floor with nurse and monitor. Pt alert upon admission

## 2011-07-21 NOTE — H&P (Signed)
[facility]                                                                                    Patient Demographics  Jo [name], is a 78 y.o. female  CSN: [id number]  MRN: [id number]  DOB - 05/03/1917  Admit Date - 07/21/2011  Outpatient Primary MD for the patient is Ezequiel Kayser, MD   With History of -  Past Medical History  Diagnosis Date  . PAF (paroxysmal atrial fibrillation)   . SSS (sick sinus syndrome)   . History of aortic insufficiency   . Anxiety   . Palpitations   . Hypertension       Past Surgical History  Procedure Date  . Insert / replace / remove pacemaker 03/16/2006  . Cataract extraction   . Cesarean section   . Tonsillectomy   . US echocardiography 07/25/2006    EF 55-60%  . US echocardiography 04/27/2004    EF 55-60%  . Cardiovascular stress test 12/09/2004    in for   Chief Complaint  Patient presents with  . Tachycardia     HPI  Jo [name]  is a 78 y.o. female, with significant past medical history of paroxysmal atrial fibrillation, sick sinus syndrome, with a permanent pacemaker, patients presents with complaints of palpitation, patient had permanent pacemaker checked over the phone on June 20 where she was called today by the cardiology office with instructions for a cardiology followup as an outpatient in a.m. to reprogram his her pacemaker as he did switch modes, at 10:00 PM tonight patient started to feel palpitations heart racing and that she reports funny feeling in her chest she denies any chest pain denies any diaphoresis any nausea or lightheadedness, upon presentation to ED patient was found to have A. fib with RVR which resolves after she received IV Cardizem, patient had 2 EKG repeated after air with RVR which showed her back to be on paced rhythm in one EKG in the other EKG showed care P wave and QRS is in dissociation , last time was reviewed on telemetry showing him back into paced rhythm,    Review of Systems    In  addition to the HPI above,  No Fever-chills, No Headache, No changes with Vision or hearing, No problems swallowing food or Liquids, No Chest pain, Cough or Shortness of Breath, complaints of palpitation No Abdominal pain, No Nausea or Vommitting, Bowel movements are regular, No Blood in stool or Urine, No dysuria, No new skin rashes or bruises, No new joints pains-aches,  No new weakness, tingling, numbness in any extremity, No recent weight gain or loss, No polyuria, polydypsia or polyphagia, No significant Mental Stressors.  A full 10 point Review of Systems was done, except as stated above, all other Review of Systems were negative.   Social History History  Substance Use Topics  . Smoking status: Never Smoker   . Smokeless tobacco: Not on file  . Alcohol Use: No     Family History Family History  Problem Relation Age of Onset  . Emphysema Father      Prior to Admission medications   Medication Sig Start Date End  Date Taking? Authorizing Provider  aspirin 325 MG tablet Take 162.5 mg by mouth daily. 1/2 TABLET DAILY   Yes Historical Provider, MD  Calcium Carbonate (CALCIUM 600 PO) Take 600 mg by mouth daily.     Yes Historical Provider, MD  carvedilol (COREG) 6.25 MG tablet Take 3.125 mg by mouth 2 (two) times daily with a meal.   Yes Historical Provider, MD  estradiol (ESTRACE) 0.1 MG/GM vaginal cream Place 2 g vaginally 3 (three) times a week. Monday, Wednesday, friday   Yes Historical Provider, MD  fish oil-omega-3 fatty acids 1000 MG capsule Take 1 g by mouth daily.     Yes Historical Provider, MD  LORazepam (ATIVAN) 0.5 MG tablet Take 0.5 mg by mouth as needed. For anxiety   Yes Historical Provider, MD  rosuvastatin (CRESTOR) 5 MG tablet Take 2.5 mg by mouth daily.   Yes Historical Provider, MD  sertraline (ZOLOFT) 50 MG tablet Take 50 mg by mouth daily.     Yes Historical Provider, MD  Vitamin D, Ergocalciferol, (DRISDOL) 50000 UNITS CAPS Take 50,000 Units by  mouth every 7 (seven) days. wednesday   Yes Historical Provider, MD  Zoledronic Acid (RECLAST IV) Inject into the vein. Taking 1 Time a Year    Yes Historical Provider, MD    Allergies  Allergen Reactions  . Citalopram Hydrobromide     Pt does not remember reaction  . Sulfonamide Derivatives Rash    Physical Exam  Vitals  Blood pressure 171/81, pulse 72, temperature 98 F (36.7 C), temperature source Oral, resp. rate 24, height 5\' 4"  (1.626 m), weight 56.7 kg (125 lb), SpO2 92.00%.   1. General elderly lying in bed in NAD,  2. Normal affect and insight, Not Suicidal or Homicidal, Awake Alert, Oriented *3.  3. No F.N deficits, ALL C.Nerves Intact, Strength 5/5 all 4 extremities, Sensation intact all 4 extremities, Plantars down going.  4. Ears and Eyes appear Normal, Conjunctivae clear, PERRLA. Moist Oral Mucosa.  5. Supple Neck, No JVD, No cervical lymphadenopathy appriciated, No Carotid Bruits.  6. Symmetrical Chest wall movement, Good air movement bilaterally, CTAB.  7. irregular irregular rhythm, No Gallops, Rubs or Murmurs, No Parasternal Heave.  8. Positive Bowel Sounds, Abdomen Soft, Non tender, No organomegaly appriciated,       No rebound -guarding or rigidity.  9.  No Cyanosis, Normal Skin Turgor, No Skin Rash or Bruise.  10. Good muscle tone,  joints appear normal , no effusions, Normal ROM.  11. No Palpable Lymph Nodes in Neck or Axillae    Data Review  CBC  Lab 07/21/11 0110 07/21/11 0029  WBC -- 9.7  HGB 13.3 13.0  HCT 39.0 39.7  PLT -- 157  MCV -- 92.5  MCH -- 30.3  MCHC -- 32.7  RDW -- 14.1  LYMPHSABS -- --  MONOABS -- --  EOSABS -- --  BASOSABS -- --  BANDABS -- --   ------------------------------------------------------------------------------------------------------------------  Chemistries   Lab 07/21/11 0110 07/21/11 0029  NA 141 139  K 3.7 3.7  CL 104 103  CO2 -- 24  GLUCOSE 188* 194*  BUN 20 21  CREATININE 0.80 0.70    CALCIUM -- 9.9  MG -- --  AST -- 23  ALT -- 19  ALKPHOS -- 57  BILITOT -- 0.4   ------------------------------------------------------------------------------------------------------------------ estimated creatinine clearance is 38.7 ml/min (by C-G formula based on Cr of 0.8). ------------------------------------------------------------------------------------------------------------------ No results found for this basename: TSH,T4TOTAL,FREET3,T3FREE,THYROIDAB in the last 72 hours   Coagulation  profile No results found for this basename: INR:5,PROTIME:5 in the last 168 hours ------------------------------------------------------------------------------------------------------------------- No results found for this basename: DDIMER:2 in the last 72 hours -------------------------------------------------------------------------------------------------------------------  Cardiac Enzymes No results found for this basename: CK:3,CKMB:3,TROPONINI:3,MYOGLOBIN:3 in the last 168 hours ------------------------------------------------------------------------------------------------------------------ No components found with this basename: POCBNP:3   ---------------------------------------------------------------------------------------------------------------  Urinalysis No results found for this basename: colorurine, appearanceur, labspec, phurine, glucoseu, hgbur, bilirubinur, ketonesur, proteinur, urobilinogen, nitrite, leukocytesur    ----------------------------------------------------------------------------------------------------------------    Imaging results:   Dg Chest Portable 1 View  07/21/2011  *RADIOLOGY REPORT*  Clinical Data: Tachycardia.  PORTABLE CHEST - 1 VIEW  Comparison: 03/17/2006  Findings: Stable appearance of cardiac pacemaker.  Shallow inspiration.  Borderline heart size with normal pulmonary vascularity.  Interstitial changes in the lungs likely to represent  fibrosis.  No focal airspace consolidation.  No blunting of costophrenic angles.  No pneumothorax.  Calcification of the aorta. Degenerative changes in the thoracic spine.  No significant change since previous study.  IMPRESSION: No evidence of active pulmonary disease.  Original Report Authenticated By: Marlon Pel, M.D.    My personal review of EKG: First one showing A. fib with RVR next second one showing P.-wave and QRS dissociation  Personally reviewed Old Chart   Assessment & Plan  Active Problems:  PAF (paroxysmal atrial fibrillation)  SSS (sick sinus syndrome)  Hypertension  Pacemaker malfunction    1. A. fib with RVR. Patient has history of paroxysmal A. fib on aspirin for anticoagulation, currently is rate controlled continue with Coreg  2. Sick sinus syndrome. Status post permanent pacemaker continuous telemetry monitor  3. Hypertension. Continue with her medication  4. Pacemaker malfunction. Will consult cardiology in a.m. to reprogram her pacemaker mode, as this was supposed to happen as an outpatient by [place]. cardiology today   DVT Prophylaxis Heparin -  AM Labs Ordered, also please review Full Orders  Admission, patients condition and plan of care including tests being ordered have been discussed with the patient and family who indicate understanding and agree with the plan and Code Status.  Code Status full Condition GUARDED Halayna Blane M.D on 07/21/2011 at 4:53 AM  Between 7am to 7pm - Pager - [phone]  After 7pm go to [email or web address] - password [email or web address]  And look for the night coverage person covering me after hours  [facility] Office  [phone]

## 2011-07-21 NOTE — Progress Notes (Signed)
UR Completed [facility] Graves-Bigelow, RN,BSN [phone]  

## 2011-07-21 NOTE — Consult Note (Signed)
Reason for Consult:atrial fib with an RVR  Referring Physician: Laquan Ludden Jo [name] is an 78 y.o. female.   HPI: Jo [name] is a pleasant 78 yo woman with a h/o PAF, tachy-brady syndrome, HTN and diet controlled diabetes. She was admitted with palpitations and found to have atrial fib/flutter with an RVR. She has also spontaneously reverted in and out of NSR. She has reached ERI on her PPM. She denies syncope. No chest pain. When she is going fast she feels mild dyspnea. No peripheral edema. She has been tried on IV digoxin and has now been placed on IV cardizem.   PMH: Past Medical History  Diagnosis Date  . History of aortic insufficiency   . Anxiety   . Hypertension   . Pacemaker   . PAF (paroxysmal atrial fibrillation)   . SSS (sick sinus syndrome)   . Dysrhythmia, cardiac     "palpitations"  . Type II diabetes mellitus     "controlled w/diet"  . Skin cancer ?1960's    "between breasts"  . Arthritis     "hands; between shoulders; back; feet"  . Panic attacks     "since losing husband 1994"  . Depression     PSHX: Past Surgical History  Procedure Date  . Cesarean section 1955  . US echocardiography 07/25/2006    EF 55-60%  . US echocardiography 04/27/2004    EF 55-60%  . Cardiovascular stress test 12/09/2004  . Cataract extraction w/ intraocular lens  implant, bilateral 1990's  . Tonsillectomy     "school age"  . Appendectomy 1955  . Dilation and curettage of uterus 1953  . Breast cyst excision ~ 1950    right  . Insert / replace / remove pacemaker 03/16/2006  . Skin cancer excision ?1960's    "between my breasts"    FAMHX: Family History  Problem Relation Age of Onset  . Emphysema Father     Social History:  reports that she has never smoked. She has never used smokeless tobacco. She reports that she does not drink alcohol or use illicit drugs.  Allergies:  Allergies  Allergen Reactions  . Sulfonamide Derivatives Rash    "it was all over my legs"  .  Citalopram Hydrobromide     Pt does not remember reaction    Medications: reviewed  Dg Chest Portable 1 View  07/21/2011  *RADIOLOGY REPORT*  Clinical Data: Tachycardia.  PORTABLE CHEST - 1 VIEW  Comparison: 03/17/2006  Findings: Stable appearance of cardiac pacemaker.  Shallow inspiration.  Borderline heart size with normal pulmonary vascularity.  Interstitial changes in the lungs likely to represent fibrosis.  No focal airspace consolidation.  No blunting of costophrenic angles.  No pneumothorax.  Calcification of the aorta. Degenerative changes in the thoracic spine.  No significant change since previous study.  IMPRESSION: No evidence of active pulmonary disease.  Original Report Authenticated By: Marlon Pel, M.D.    ROS  As stated in the HPI and negative for all other systems.  Physical Exam  Vitals:Blood pressure 156/77, pulse 76, temperature 98.2 F (36.8 C), temperature source Oral, resp. rate 18, height 5\' 4"  (1.626 m), weight 125 lb (56.7 kg), SpO2 90.00%.  Well appearing elderly woman, NAD HEENT: Unremarkable Neck:  No JVD, no thyromegally Lungs:  Clear with no wheezes, rales, or rhonchi HEART:  IRegular tachy rhythm, no murmurs, no rubs, no clicks Abd:  Flat, positive bowel sounds, no organomegally, no rebound, no guarding Ext:  2 plus pulses, no  edema, no cyanosis, no clubbing Skin:  No rashes no nodules Neuro:  CN II through XII intact, motor grossly intact  12 lead ECG - atrial fib with an RVR Assessment/Plan: 1. Atrial fib with an RVR 2. S/p PPM at ERI, reverted to VVI, now reprogrammed. 3. HTN 4. Advanced age Rec: a difficult situation but her stroke risk is high. Will start coumadin and lovenox. Would dc ASA once INR is therapeutic. Will see how she does with IV cardizem and digoxin. She could also be treated with flecainide or amiodarone if she does not maintain NSR with AV nodal blocking drugs. Finally, she will need her device changed but our schedule  is full and could not likely be done tomorrow.  Jo Gowda TaylorMD 07/21/2011, 7:04 PM

## 2011-07-22 DIAGNOSIS — I4891 Unspecified atrial fibrillation: Secondary | ICD-10-CM

## 2011-07-22 DIAGNOSIS — I10 Essential (primary) hypertension: Secondary | ICD-10-CM

## 2011-07-22 DIAGNOSIS — T82190A Other mechanical complication of cardiac electrode, initial encounter: Secondary | ICD-10-CM

## 2011-07-22 DIAGNOSIS — I495 Sick sinus syndrome: Secondary | ICD-10-CM

## 2011-07-22 LAB — PROTIME-INR: INR: 1.23 (ref 0.00–1.49)

## 2011-07-22 MED ORDER — ASPIRIN EC 81 MG PO TBEC
162.0000 mg | DELAYED_RELEASE_TABLET | Freq: Every day | ORAL | Status: DC
  Administered 2011-07-22 – 2011-07-23 (×2): 162 mg via ORAL
  Filled 2011-07-22 (×2): qty 2

## 2011-07-22 MED ORDER — WARFARIN VIDEO
Freq: Once | Status: AC
  Administered 2011-07-22: 09:00:00

## 2011-07-22 MED ORDER — WARFARIN SODIUM 5 MG PO TABS
5.0000 mg | ORAL_TABLET | Freq: Once | ORAL | Status: DC
  Filled 2011-07-22: qty 1

## 2011-07-22 MED ORDER — FLECAINIDE ACETATE 50 MG PO TABS
50.0000 mg | ORAL_TABLET | Freq: Two times a day (BID) | ORAL | Status: DC
  Administered 2011-07-22 – 2011-07-23 (×3): 50 mg via ORAL
  Filled 2011-07-22 (×4): qty 1

## 2011-07-22 MED ORDER — WARFARIN - PHARMACIST DOSING INPATIENT: Freq: Every day | Status: DC

## 2011-07-22 MED ORDER — ENOXAPARIN SODIUM 60 MG/0.6ML ~~LOC~~ SOLN
1.0000 mg/kg | Freq: Two times a day (BID) | SUBCUTANEOUS | Status: DC
  Administered 2011-07-22: 55 mg via SUBCUTANEOUS
  Filled 2011-07-22 (×3): qty 0.6

## 2011-07-22 MED ORDER — COUMADIN BOOK
Freq: Once | Status: AC
  Administered 2011-07-22: 09:00:00
  Filled 2011-07-22: qty 1

## 2011-07-22 MED ORDER — DILTIAZEM HCL ER COATED BEADS 180 MG PO CP24
180.0000 mg | ORAL_CAPSULE | Freq: Every day | ORAL | Status: DC
  Administered 2011-07-22 – 2011-07-23 (×2): 180 mg via ORAL
  Filled 2011-07-22 (×2): qty 1

## 2011-07-22 MED ORDER — APIXABAN 2.5 MG PO TABS
2.5000 mg | ORAL_TABLET | Freq: Two times a day (BID) | ORAL | Status: DC
  Administered 2011-07-22 – 2011-07-23 (×2): 2.5 mg via ORAL
  Filled 2011-07-22 (×4): qty 1

## 2011-07-22 NOTE — Progress Notes (Signed)
SUBJECTIVE: The patient is doing well today.  At this time, she denies chest pain, shortness of breath, or any new concerns. Her tachypalpitations are much better with rate control.     Marland Kitchen aspirin  162.5 mg Oral Daily  . atorvastatin  10 mg Oral q1800  . carvedilol  3.125 mg Oral BID WC  . digoxin  0.25 mg Intravenous Once  . diltiazem  180 mg Oral Daily  . docusate sodium  100 mg Oral BID  . enoxaparin (LOVENOX) injection  1 mg/kg Subcutaneous Q12H  . estradiol  2 g Vaginal 3 times weekly  . flecainide  50 mg Oral Q12H  . off the beat book   Does not apply Once  . omega-3 acid ethyl esters  1 g Oral Q breakfast  . sertraline  50 mg Oral Daily  . Vitamin D (Ergocalciferol)  50,000 Units Oral Q Wed  . DISCONTD: aspirin  162 mg Oral Once  . DISCONTD: enoxaparin (LOVENOX) injection  40 mg Subcutaneous Q24H  . DISCONTD: heparin  5,000 Units Subcutaneous Q8H      . DISCONTD: sodium chloride 125 mL/hr at 07/21/11 0053  . DISCONTD: diltiazem (CARDIZEM) infusion Stopped (07/21/11 0105)  . DISCONTD: diltiazem (CARDIZEM) infusion 5 mg/hr (07/21/11 1834)    OBJECTIVE: Physical Exam: Filed Vitals:   07/21/11 0654 07/21/11 1400 07/21/11 2015 07/22/11 0515  BP: 179/119 156/77 171/77 110/67  Pulse: 149 76 71 82  Temp:  98.2 F (36.8 C) 98.3 F (36.8 C) 97.5 F (36.4 C)  TempSrc:  Oral Oral Oral  Resp: 18 18 16 16   Height:      Weight:      SpO2: 94% 90% 97% 99%   No intake or output data in the 24 hours ending 07/22/11 0804  Telemetry reveals afib/ atrial flutter presently though she was in sinus last night, V rates are controlled GEN- The patient is well appearing, alert and oriented x 3 today.   Head- normocephalic, atraumatic Eyes-  Sclera clear, conjunctiva pink Ears- hearing intact Oropharynx- clear Neck- supple,   Lungs- Clear to ausculation bilaterally, normal work of breathing Heart- irregular rate and rhythm, no murmurs, rubs or gallops, PMI not laterally  displaced GI- soft, NT, ND, + BS Extremities- no clubbing, cyanosis, or edema Neuro- strength and sensation are intact  LABS: Basic Metabolic Panel:  Basename 07/21/11 0500 07/21/11 0110 07/21/11 0029  NA 142 141 --  K 3.8 3.7 --  CL 109 104 --  CO2 25 -- 24  GLUCOSE 119* 188* --  BUN 19 20 --  CREATININE 0.64 0.80 --  CALCIUM 9.2 -- 9.9  MG -- -- --  PHOS -- -- --   Liver Function Tests:  Basename 07/21/11 0029  AST 23  ALT 19  ALKPHOS 57  BILITOT 0.4  PROT 6.8  ALBUMIN 3.8   No results found for this basename: LIPASE:2,AMYLASE:2 in the last 72 hours CBC:  Basename 07/21/11 0500 07/21/11 0110 07/21/11 0029  WBC 10.0 -- 9.7  NEUTROABS -- -- --  HGB 12.1 13.3 --  HCT 36.0 39.0 --  MCV 91.6 -- 92.5  PLT 152 -- 157   Cardiac Enzymes: No results found for this basename: CKTOTAL:3,CKMB:3,CKMBINDEX:3,TROPONINI:3 in the last 72 hours BNP: No components found with this basename: POCBNP:3 D-Dimer: No results found for this basename: DDIMER:2 in the last 72 hours Hemoglobin A1C: No results found for this basename: HGBA1C in the last 72 hours Fasting Lipid Panel: No results found for this  basename: CHOL,HDL,LDLCALC,TRIG,CHOLHDL,LDLDIRECT in the last 72 hours Thyroid Function Tests: No results found for this basename: TSH,T4TOTAL,FREET3,T3FREE,THYROIDAB in the last 72 hours Anemia Panel: No results found for this basename: VITAMINB12,FOLATE,FERRITIN,TIBC,IRON,RETICCTPCT in the last 72 hours  RADIOLOGY: Dg Chest Portable 1 View  07/21/2011  *RADIOLOGY REPORT*  Clinical Data: Tachycardia.  PORTABLE CHEST - 1 VIEW  Comparison: 03/17/2006  Findings: Stable appearance of cardiac pacemaker.  Shallow inspiration.  Borderline heart size with normal pulmonary vascularity.  Interstitial changes in the lungs likely to represent fibrosis.  No focal airspace consolidation.  No blunting of costophrenic angles.  No pneumothorax.  Calcification of the aorta. Degenerative changes in the  thoracic spine.  No significant change since previous study.  IMPRESSION: No evidence of active pulmonary disease.  Original Report Authenticated By: Marlon Pel, M.D.    ASSESSMENT AND PLAN:  Principal Problem:  *Atrial fibrillation with RVR Active Problems:  PAF (paroxysmal atrial fibrillation)  SSS (sick sinus syndrome)  History of aortic insufficiency  Hypertension  Pacemaker malfunction  Type II diabetes mellitus  Panic attacks  Anxiety  1. Afib/ atrial flutter- paroxysms overnight, presently in afib.  She is feeling much better with rate control. Will convert diltiazem to PO.  Add low dose flecainide. She will need an echo. I agree with Dr Ladona Ridgel that her stroke risks are high.  Will add coumadin with lovenox bridge.  2. Bradycardia- pacemaker has reached ERI but has been reprogrammed to AAI->DDD.  There is no urgent indication for generator change.  I would prefer that she be stable on coumadin and afib under good control before we proceed with generator change.  I will plan to do this 08/03/11 at scheduled.  I anticipate that she can go home soon (likely tomorrow) if she is tolerating lovenox and heart rates are stable on PO medicines.  She will need close outpatient INR checks.  Please call with questions.  Hillis Range, MD 07/22/2011 8:04 AM

## 2011-07-22 NOTE — Progress Notes (Signed)
[facility] PROGRESS NOTE  Jo [name] ZOX:[id number] DOB: 1917/12/30 DOA: 07/21/2011 PCP: Ezequiel Kayser, MD  Assessment/Plan: 1. Afib with RVR - patient was admitted with intermittent episodes of afib with HR into the 180s. Patient did not have chest pain with the episodes. She had no dyspnea. She has not been on anticoagulation up to time of admission. Patient was prescribed Cardizem drip in the emergency room but did not receive any prior to converting spontaneously to NSR. Coreg 3.125 BID was resumed. She did receive one dose of iv digoxin on June 27. Started on  Cardizem drip on June 27 at 6 PM. She converted to sinus rhythm during the night. Oral Cardizem was started on June 28 and the patient also received Flecainide as prescribed by Dr. Johney Frame. Anticoagulation with full dose Lovenox and Coumadin was initiated on June 27. She did not receive any Coumadin as of yet. On June 28 patient was switched to apixaban 2.5 BID  2. Panic / anxiety attacks - resumed ativan   3. S/p pacemaker - scheduled for battery replacement 08/03/11 4. Patient will be transferred to [facility] cardiology service as of now  Principal Problem:  *Atrial fibrillation with RVR Active Problems:  PAF (paroxysmal atrial fibrillation)  SSS (sick sinus syndrome)  History of aortic insufficiency  Hypertension  Pacemaker malfunction  Type II diabetes mellitus  Panic attacks  Anxiety  Code Status: full Family Communication: son Disposition Plan: home  Jo Augsburger, MD  [facility] Pager [phone]  If 7PM-7AM, please contact night-coverage [email or web address] Password [facility] 07/22/2011, 4:40 PM   LOS: 1 day   HPI/Subjective: No further palpitations no chest pain  Objective: Filed Vitals:   07/21/11 2015 07/22/11 0515 07/22/11 0856 07/22/11 1351  BP: 171/77 110/67 124/75 114/61  Pulse: 71 82  94  Temp: 98.3 F (36.8 C) 97.5 F (36.4 C)  97.7 F (36.5 C)  TempSrc: Oral Oral  Oral  Resp: 16 16  16    Height:      Weight:      SpO2: 97% 99%  94%    Intake/Output Summary (Last 24 hours) at 07/22/11 1640 Last data filed at 07/22/11 1000  Gross per 24 hour  Intake    240 ml  Output      0 ml  Net    240 ml    Exam:   General:  axox3  Cardiovascular: Regular rate and rhythm, no murmurs   Respiratory: ctab  Abdomen: soft, nt   Data Reviewed: Basic Metabolic Panel:  Lab 07/21/11 8295 07/21/11 0110 07/21/11 0029  NA 142 141 139  K 3.8 3.7 3.7  CL 109 104 103  CO2 25 -- 24  GLUCOSE 119* 188* 194*  BUN 19 20 21   CREATININE 0.64 0.80 0.70  CALCIUM 9.2 -- 9.9  MG -- -- --  PHOS -- -- --   Liver Function Tests:  Lab 07/21/11 0029  AST 23  ALT 19  ALKPHOS 57  BILITOT 0.4  PROT 6.8  ALBUMIN 3.8   No results found for this basename: LIPASE:5,AMYLASE:5 in the last 168 hours No results found for this basename: AMMONIA:5 in the last 168 hours CBC:  Lab 07/21/11 0500 07/21/11 0110 07/21/11 0029  WBC 10.0 -- 9.7  NEUTROABS -- -- --  HGB 12.1 13.3 13.0  HCT 36.0 39.0 39.7  MCV 91.6 -- 92.5  PLT 152 -- 157   Cardiac Enzymes: No results found for this basename: CKTOTAL:5,CKMB:5,CKMBINDEX:5,TROPONINI:5 in the last 168 hours BNP (last 3  results) No results found for this basename: PROBNP:3 in the last 8760 hours CBG: No results found for this basename: GLUCAP:5 in the last 168 hours  No results found for this or any previous visit (from the past 240 hour(s)).   Studies: Dg Chest Portable 1 View  07/21/2011  *RADIOLOGY REPORT*  Clinical Data: Tachycardia.  PORTABLE CHEST - 1 VIEW  Comparison: 03/17/2006  Findings: Stable appearance of cardiac pacemaker.  Shallow inspiration.  Borderline heart size with normal pulmonary vascularity.  Interstitial changes in the lungs likely to represent fibrosis.  No focal airspace consolidation.  No blunting of costophrenic angles.  No pneumothorax.  Calcification of the aorta. Degenerative changes in the thoracic spine.  No  significant change since previous study.  IMPRESSION: No evidence of active pulmonary disease.  Original Report Authenticated By: Marlon Pel, M.D.    Scheduled Meds:    . apixaban  2.5 mg Oral BID  . aspirin EC  162 mg Oral Daily  . atorvastatin  10 mg Oral q1800  . carvedilol  3.125 mg Oral BID WC  . coumadin book   Does not apply Once  . digoxin  0.25 mg Intravenous Once  . diltiazem  180 mg Oral Daily  . docusate sodium  100 mg Oral BID  . estradiol  2 g Vaginal 3 times weekly  . flecainide  50 mg Oral Q12H  . omega-3 acid ethyl esters  1 g Oral Q breakfast  . sertraline  50 mg Oral Daily  . Vitamin D (Ergocalciferol)  50,000 Units Oral Q Wed  . warfarin   Does not apply Once  . DISCONTD: aspirin  162 mg Oral Once  . DISCONTD: aspirin  162.5 mg Oral Daily  . DISCONTD: enoxaparin (LOVENOX) injection  40 mg Subcutaneous Q24H  . DISCONTD: enoxaparin (LOVENOX) injection  1 mg/kg Subcutaneous Q12H  . DISCONTD: heparin  5,000 Units Subcutaneous Q8H  . DISCONTD: warfarin  5 mg Oral ONCE-1800  . DISCONTD: Warfarin - Pharmacist Dosing Inpatient   Does not apply q1800   Continuous Infusions:    . DISCONTD: diltiazem (CARDIZEM) infusion 5 mg/hr (07/21/11 1834)

## 2011-07-22 NOTE — Progress Notes (Signed)
ANTICOAGULATION CONSULT NOTE - Initial Consult  Pharmacy Consult for coumadin Indication: atrial fibrillation  Allergies  Allergen Reactions  . Sulfonamide Derivatives Rash    "it was all over my legs"  . Citalopram Hydrobromide     Pt does not remember reaction    Patient Measurements: Height: 5\' 4"  (162.6 cm) Weight: 125 lb (56.7 kg) IBW/kg (Calculated) : 54.7    Vital Signs: Temp: 97.5 F (36.4 C) (06/28 0515) Temp src: Oral (06/28 0515) BP: 110/67 mmHg (06/28 0515) Pulse Rate: 82  (06/28 0515)  Labs:  Basename 07/21/11 0500 07/21/11 0110 07/21/11 0029  HGB 12.1 13.3 --  HCT 36.0 39.0 39.7  PLT 152 -- 157  APTT -- -- --  LABPROT -- -- --  INR -- -- --  HEPARINUNFRC -- -- --  CREATININE 0.64 0.80 0.70  CKTOTAL -- -- --  CKMB -- -- --  TROPONINI -- -- --    Estimated Creatinine Clearance: 38.7 ml/min (by C-G formula based on Cr of 0.64).   Medical History: Past Medical History  Diagnosis Date  . History of aortic insufficiency   . Anxiety   . Hypertension   . Pacemaker   . PAF (paroxysmal atrial fibrillation)   . SSS (sick sinus syndrome)   . Dysrhythmia, cardiac     "palpitations"  . Type II diabetes mellitus     "controlled w/diet"  . Skin cancer ?1960's    "between breasts"  . Arthritis     "hands; between shoulders; back; feet"  . Panic attacks     "since losing husband 1994"  . Depression     Medications:  Prescriptions prior to admission  Medication Sig Dispense Refill  . aspirin 325 MG tablet Take 162.5 mg by mouth daily. 1/2 TABLET DAILY      . Calcium Carbonate (CALCIUM 600 PO) Take 600 mg by mouth daily.        . carvedilol (COREG) 6.25 MG tablet Take 3.125 mg by mouth 2 (two) times daily with a meal.      . estradiol (ESTRACE) 0.1 MG/GM vaginal cream Place 2 g vaginally 3 (three) times a week. Monday, Wednesday, friday      . fish oil-omega-3 fatty acids 1000 MG capsule Take 1 g by mouth daily.        Marland Kitchen LORazepam (ATIVAN) 0.5 MG  tablet Take 0.5 mg by mouth as needed. For anxiety      . rosuvastatin (CRESTOR) 5 MG tablet Take 2.5 mg by mouth daily.      . sertraline (ZOLOFT) 50 MG tablet Take 50 mg by mouth daily.        . Vitamin D, Ergocalciferol, (DRISDOL) 50000 UNITS CAPS Take 50,000 Units by mouth every 7 (seven) days. wednesday      . Zoledronic Acid (RECLAST IV) Inject into the vein. Taking 1 Time a Year         Assessment: 78 yo female with afib (noted with HTN, DM) to start coumadin. Patient also noted on full dose lovenox. Baseline INR=1.23.  Goal of Therapy:  INR 2-3 Monitor platelets by anticoagulation protocol: Yes   Plan:  -Coumadin 5mg  po x1 today -Daily PT/INR  Thank you for asking pharmacy to be involved in the care of this patient.  Harland German, Pharm D 07/22/2011 8:29 AM

## 2011-07-22 NOTE — Progress Notes (Signed)
Pt switched from normal sinus rhythm to a-fib, patient is asymptomatic and vitals signs are stables. Notified hospitalist and cardiologist on call.

## 2011-07-23 DIAGNOSIS — I359 Nonrheumatic aortic valve disorder, unspecified: Secondary | ICD-10-CM

## 2011-07-23 DIAGNOSIS — I4891 Unspecified atrial fibrillation: Secondary | ICD-10-CM | POA: Diagnosis not present

## 2011-07-23 DIAGNOSIS — I495 Sick sinus syndrome: Secondary | ICD-10-CM | POA: Diagnosis not present

## 2011-07-23 LAB — PROTIME-INR
INR: 1.25 (ref 0.00–1.49)
Prothrombin Time: 16 seconds — ABNORMAL HIGH (ref 11.6–15.2)

## 2011-07-23 MED ORDER — APIXABAN 2.5 MG PO TABS: 2.5000 mg | ORAL_TABLET | Freq: Two times a day (BID) | ORAL | Status: DC

## 2011-07-23 MED ORDER — DSS 100 MG PO CAPS: 100.0000 mg | ORAL_CAPSULE | Freq: Two times a day (BID) | ORAL | Status: DC | PRN

## 2011-07-23 MED ORDER — DILTIAZEM HCL ER COATED BEADS 180 MG PO CP24: 180.0000 mg | ORAL_CAPSULE | Freq: Every day | ORAL | Status: DC

## 2011-07-23 MED ORDER — FLECAINIDE ACETATE 50 MG PO TABS: 50.0000 mg | ORAL_TABLET | Freq: Two times a day (BID) | ORAL | Status: DC

## 2011-07-23 NOTE — Progress Notes (Signed)
  Echocardiogram 2D Echocardiogram has been performed.  Jo [name] FRANCES 07/23/2011, 9:59 AM

## 2011-07-23 NOTE — Discharge Planning (Signed)
Physician Discharge Summary  Patient ID: Jo [name] MRN: [id number] DOB/AGE: Jo [name] 07, 1919 78 y.o.  Admit date: 07/21/2011 Discharge date: 07/23/2011  Primary Discharge Diagnosis: Atrial Fibrillation Secondary Discharge Diagnosis: 1. Chest pain 2. Tachybrady s/p pacemaker (Medtronic.PPM Model Number: [id number] PPM Serial Number: [id number]  PPM DOI: 03/16/2006)  3. Hypertension 4. Anxiety 5. Type II diabetes   Significant Diagnostic Studies: Echocardiogram competed day of discharge. Results pending.  Consults: None  Hospital Course: Jo [name] is a 78 year old patient followed by Dr. Sharrell [name] with a history of paroxysmal atrial fibrillation tachybradycardia syndrome status post pacemaker hypertension and diet controlled diabetes. She was noted to initially by the hospitalist service with palpitation and found to be in atrial fib flutter with RVR. She was placed on IV Cardizem and reverted back to normal sinus rhythm. We are asked to see initially in consult and take over her care from the hospitalist service.    Her Medtronic pacemaker is at [facility] and was reprogrammed to VVI her pacemaker representative and after being seen by Dr. Sharrell [name]. She was started on Apixaban 2.5 mg BID and flecainide 50 mg twice a day and continued on Cardizem 180 mg daily [p.o. after transition off of IV. Rate was controlled. Stroke risk was found to be very high. Aspirin was stopped prior to discharge,  however. She is scheduled for generator change on 08/03/2011 by Jo [name]. She has been advised to hold Crixivan the night before and the morning of the procedure. She will be seen by physician assistant in the [facility] office on one week followup after discharge today. Echocardiogram was completed prior to discharge the results are pending at this time. It will  be reviewed with the patient on followup. She was seen and examined by Jo [name] prior to discharge and found to be stable. She is a nervous person  and multiple questions were answered. She is willing to followup in one week and proceed with planned generator change.    Discharge Exam: Blood pressure 136/78, pulse 65, temperature 98.5 F (36.9 C), temperature source Oral, resp. rate 16, height 5\' 4"  (1.626 m), weight 125 lb (56.7 kg), SpO2 95.00%.    Labs:   Lab Results  Component Value Date   WBC 10.0 07/21/2011   HGB 12.1 07/21/2011   HCT 36.0 07/21/2011   MCV 91.6 07/21/2011   PLT 152 07/21/2011    Lab 07/21/11 0500 07/21/11 0029  NA 142 --  K 3.8 --  CL 109 --  CO2 25 --  BUN 19 --  CREATININE 0.64 --  CALCIUM 9.2 --  PROT -- 6.8  BILITOT -- 0.4  ALKPHOS -- 57  ALT -- 19  AST -- 23  GLUCOSE 119* --        Radiology: Dg Chest Portable 1 View  07/21/2011  *RADIOLOGY REPORT*  Clinical Data: Tachycardia.  PORTABLE CHEST - 1 VIEW  Comparison: 03/17/2006  Findings: Stable appearance of cardiac pacemaker.  Shallow inspiration.  Borderline heart size with normal pulmonary vascularity.  Interstitial changes in the lungs likely to represent fibrosis.  No focal airspace consolidation.  No blunting of costophrenic angles.  No pneumothorax.  Calcification of the aorta. Degenerative changes in the thoracic spine.  No significant change since previous study.  IMPRESSION: No evidence of active pulmonary disease.  Original Report Authenticated By: Marlon Pel, M.D.    NGE:[id number] Atrial flutter with variable A-V block with occasional ventricular-paced complexes Nonspecific ST and T wave abnormality Compared to a  prior EKG, flutter has replaced sinus rhythm  FOLLOW UP PLANS AND APPOINTMENTS Discharge Orders    Future Orders Please Complete By Expires   Diet - low sodium heart healthy      Increase activity slowly        Medication List  As of 07/23/2011 11:43 AM   STOP taking these medications         aspirin 325 MG tablet         TAKE these medications         apixaban 2.5 MG Tabs tablet   Commonly known as:  ELIQUIS   Take 1 tablet (2.5 mg total) by mouth 2 (two) times daily.      CALCIUM 600 PO   Take 600 mg by mouth daily.      carvedilol 6.25 MG tablet   Commonly known as: COREG   Take 3.125 mg by mouth 2 (two) times daily with a meal.      diltiazem 180 MG 24 hr capsule   Commonly known as: CARDIZEM CD   Take 1 capsule (180 mg total) by mouth daily.      DSS 100 MG Caps   Take 100 mg by mouth 2 (two) times daily as needed for constipation.      estradiol 0.1 MG/GM vaginal cream   Commonly known as: ESTRACE   Place 2 g vaginally 3 (three) times a week. Monday, Wednesday, friday      fish oil-omega-3 fatty acids 1000 MG capsule   Take 1 g by mouth daily.      flecainide 50 MG tablet   Commonly known as: TAMBOCOR   Take 1 tablet (50 mg total) by mouth every 12 (twelve) hours.      LORazepam 0.5 MG tablet   Commonly known as: ATIVAN   Take 0.5 mg by mouth as needed. For anxiety      RECLAST 5 MG/100ML Soln   Generic drug: zoledronic acid   Inject 5 mg into the vein See admin instructions. yearly      rosuvastatin 5 MG tablet   Commonly known as: CRESTOR   Take 2.5 mg by mouth daily.      sertraline 50 MG tablet   Commonly known as: ZOLOFT   Take 50 mg by mouth daily.      Vitamin D (Ergocalciferol) 50000 UNITS Caps   Commonly known as: DRISDOL   Take 50,000 Units by mouth every 7 (seven) days. wednesday           Follow-up Information    Follow up with Jo Newcomer, PA. (Our office will call you for appointment in one week.)    Contact information:   [address] [place] [address] [phone]            Time spent with patient to include physician time: 35 minutes Signed: Joni [name] 07/23/2011, 11:43 AM Co-Sign MD

## 2011-07-23 NOTE — Progress Notes (Signed)
SUBJECTIVE: The patient is doing well today.  At this time, she denies chest pain, shortness of breath, or any new concerns.     Marland Kitchen apixaban  2.5 mg Oral BID  . atorvastatin  10 mg Oral q1800  . carvedilol  3.125 mg Oral BID WC  . diltiazem  180 mg Oral Daily  . docusate sodium  100 mg Oral BID  . estradiol  2 g Vaginal 3 times weekly  . flecainide  50 mg Oral Q12H  . omega-3 acid ethyl esters  1 g Oral Q breakfast  . sertraline  50 mg Oral Daily  . Vitamin D (Ergocalciferol)  50,000 Units Oral Q Wed  . DISCONTD: aspirin EC  162 mg Oral Daily  . DISCONTD: enoxaparin (LOVENOX) injection  1 mg/kg Subcutaneous Q12H  . DISCONTD: warfarin  5 mg Oral ONCE-1800  . DISCONTD: Warfarin - Pharmacist Dosing Inpatient   Does not apply q1800      OBJECTIVE: Physical Exam: Filed Vitals:   07/22/11 1732 07/22/11 2100 07/23/11 0500 07/23/11 0938  BP: 134/65 123/68 126/72 136/78  Pulse: 66 65 65   Temp:  98.2 F (36.8 C) 98.5 F (36.9 C)   TempSrc:  Oral Oral   Resp:  18 16   Height:      Weight:      SpO2:  93% 95%     Intake/Output Summary (Last 24 hours) at 07/23/11 1125 Last data filed at 07/23/11 0900  Gross per 24 hour  Intake    840 ml  Output      0 ml  Net    840 ml    Telemetry reveals a paced rhythm  GEN- The patient is well appearing, alert and oriented x 3 today.   Head- normocephalic, atraumatic Eyes-  Sclera clear, conjunctiva pink Ears- hearing intact Oropharynx- clear Neck- supple, no JVP Lymph- no cervical lymphadenopathy Lungs- Clear to ausculation bilaterally, normal work of breathing Heart- Regular rate and rhythm, no murmurs, rubs or gallops, PMI not laterally displaced GI- soft, NT, ND, + BS Extremities- no clubbing, cyanosis, or edema   LABS: Basic Metabolic Panel:  Basename 07/21/11 0500 07/21/11 0110 07/21/11 0029  NA 142 141 --  K 3.8 3.7 --  CL 109 104 --  CO2 25 -- 24  GLUCOSE 119* 188* --  BUN 19 20 --  CREATININE 0.64 0.80 --    CALCIUM 9.2 -- 9.9  MG -- -- --  PHOS -- -- --   Liver Function Tests:  Basename 07/21/11 0029  AST 23  ALT 19  ALKPHOS 57  BILITOT 0.4  PROT 6.8  ALBUMIN 3.8   No results found for this basename: LIPASE:2,AMYLASE:2 in the last 72 hours CBC:  Basename 07/21/11 0500 07/21/11 0110 07/21/11 0029  WBC 10.0 -- 9.7  NEUTROABS -- -- --  HGB 12.1 13.3 --  HCT 36.0 39.0 --  MCV 91.6 -- 92.5  PLT 152 -- 157  ASSESSMENT AND PLAN:  Principal Problem:  *Atrial fibrillation with RVR Active Problems:  PAF (paroxysmal atrial fibrillation)  SSS (sick sinus syndrome)  History of aortic insufficiency  Hypertension  Pacemaker malfunction  Type II diabetes mellitus  Panic attacks  Anxiety  1. afib- converted to an atrial paced rhythm Continue flecainide 50mg  BID Continue current coreg and diltiazem at discharge apixaban 2.5mg  BID Stop ASA  2. Chest pain- caused by afib with RVR No further workup planned presently  3. Tachy/brady- normal pacemaker function She is scheduled for generator  change 08/03/11. She will hold apixaban the night before and morning of the procedure  DC to home Follow-up with our NP/PA in 3-5 days.    Hillis Range, MD 07/23/2011 11:25 AM

## 2011-07-26 ENCOUNTER — Encounter (HOSPITAL_COMMUNITY): Payer: Self-pay | Admitting: Pharmacy Technician

## 2011-07-29 ENCOUNTER — Encounter: Payer: Self-pay | Admitting: Internal Medicine

## 2011-07-29 ENCOUNTER — Encounter: Payer: Self-pay | Admitting: Nurse Practitioner

## 2011-07-29 ENCOUNTER — Ambulatory Visit (INDEPENDENT_AMBULATORY_CARE_PROVIDER_SITE_OTHER): Payer: Medicare Other | Admitting: Nurse Practitioner

## 2011-07-29 VITALS — BP 124/72 | HR 64 | Ht 62.0 in | Wt 123.4 lb

## 2011-07-29 DIAGNOSIS — I4891 Unspecified atrial fibrillation: Secondary | ICD-10-CM | POA: Diagnosis not present

## 2011-07-29 DIAGNOSIS — I480 Paroxysmal atrial fibrillation: Secondary | ICD-10-CM

## 2011-07-29 DIAGNOSIS — Z8679 Personal history of other diseases of the circulatory system: Secondary | ICD-10-CM | POA: Diagnosis not present

## 2011-07-29 DIAGNOSIS — T82111A Breakdown (mechanical) of cardiac pulse generator (battery), initial encounter: Secondary | ICD-10-CM

## 2011-07-29 DIAGNOSIS — I10 Essential (primary) hypertension: Secondary | ICD-10-CM

## 2011-07-29 LAB — BASIC METABOLIC PANEL
BUN: 23 mg/dL (ref 6–23)
CO2: 28 mEq/L (ref 19–32)
Calcium: 9.9 mg/dL (ref 8.4–10.5)
Chloride: 105 mEq/L (ref 96–112)
Creatinine, Ser: 0.8 mg/dL (ref 0.4–1.2)
GFR: 71.28 mL/min (ref >60.00)
Glucose, Bld: 105 mg/dL — ABNORMAL HIGH (ref 70–99)
Potassium: 4.2 mEq/L (ref 3.5–5.1)
Sodium: 141 mEq/L (ref 135–145)

## 2011-07-29 LAB — CBC WITH DIFFERENTIAL/PLATELET
Basophils Absolute: 0 10*3/uL (ref 0.0–0.1)
Basophils Relative: 0.5 % (ref 0.0–3.0)
Eosinophils Absolute: 0.2 10*3/uL (ref 0.0–0.7)
Eosinophils Relative: 2.4 % (ref 0.0–5.0)
HCT: 38.2 % (ref 36.0–46.0)
Hemoglobin: 12.5 g/dL (ref 12.0–15.0)
Lymphocytes Relative: 21.8 % (ref 12.0–46.0)
Lymphs Abs: 1.9 10*3/uL (ref 0.7–4.0)
MCHC: 32.8 g/dL (ref 30.0–36.0)
MCV: 94.5 fl (ref 78.0–100.0)
Monocytes Absolute: 0.7 10*3/uL (ref 0.1–1.0)
Monocytes Relative: 8.4 % (ref 3.0–12.0)
Neutro Abs: 5.9 10*3/uL (ref 1.4–7.7)
Neutrophils Relative %: 66.9 % (ref 43.0–77.0)
Platelets: 219 10*3/uL (ref 150.0–400.0)
RBC: 4.05 Mil/uL (ref 3.87–5.11)
RDW: 14.1 % (ref 11.5–14.6)
WBC: 8.8 10*3/uL (ref 4.5–10.5)

## 2011-07-29 LAB — PROTIME-INR
INR: 1.3 ratio — ABNORMAL HIGH (ref 0.8–1.0)
Prothrombin Time: 14.7 s — ABNORMAL HIGH (ref 10.2–12.4)

## 2011-07-29 LAB — APTT: aPTT: 30.6 s — ABNORMAL HIGH (ref 21.7–28.8)

## 2011-07-29 NOTE — Patient Instructions (Addendum)
We are going to recheck your labs today  We will get your pacemaker rechecked today  We will proceed on with generator replacement with Dr. Johney Frame on next Wednesday at 8:30am at [facility]  Hold the Apixaban (Eliquis) both doses on Tuesday  Go to [facility] on Wednesday, July 10th at 6:30 am - 2nd floor Short Stay  No food or drink after midnight on Tuesday.  Bring your medicines and you can take them after your procedure when they resume your diet.   Call the [facility] office at [phone] if you have any questions, problems or concerns.

## 2011-07-29 NOTE — Assessment & Plan Note (Signed)
Her pacemaker is at [facility]. She is for generator replacement next Wednesday at 8:30 am. She has to arrive at 6:30 am. I have held her Eliquis for both doses on Tuesday instead of the night before and morning of her procedure since the procedure is so early on Wednesday. The procedure and directions have been reviewed with the patient and daughter and they are willing to proceed.

## 2011-07-29 NOTE — Assessment & Plan Note (Signed)
She is back in sinus today per EKG and pacemaker check. She remains on Flecainide and her anticoagulation with Eliquis. Will recheck her labs today. Will tentatively see her back in 4 weeks with Dr. [place]. Patient is agreeable to this plan and will call if any problems develop in the interim.

## 2011-07-29 NOTE — Assessment & Plan Note (Signed)
Moderate per recent echo. EF is normal.

## 2011-07-29 NOTE — Assessment & Plan Note (Signed)
Blood pressure looks ok.

## 2011-07-29 NOTE — Progress Notes (Signed)
Jo [name] Date of Birth: Feb 05, 1917 Medical Record #[id number]  History of Present Illness: Jo [name] is seen today for her 3 month check as well as a post hospital visit. She is seen for Jo [name]. She is 78 years of age. Has PAF with underlying pacemaker in place. Her pacemaker is now at [facility] and she has planned replacement for early next week. She was just discharged after being admitted with atrial fib with RVR. Her pacemaker is currently programmed to VVI. She is now on anticoagulation with Apixaban given her increased risk of stroke as well as Flecainide. Other issues include HTN, aortic insufficiency, and advanced age.  She comes in today. She is here with her daughter.  She is doing well. Still a little weak. Less palpitations reported. She is for a generator change out next week with Jo [name]. No chest pain. She has been constipated and using stool softeners. No bleeding or bruising reported.   Current Outpatient Prescriptions on File Prior to Visit  Medication Sig Dispense Refill  . apixaban (ELIQUIS) 2.5 MG TABS tablet Take 2.5 mg by mouth 2 (two) times daily.      . Calcium Carbonate (CALCIUM 600 PO) Take 600 mg by mouth daily.        . carvedilol (COREG) 6.25 MG tablet Take 1.56 mg by mouth 2 (two) times daily with a meal. 1/4 of a tablet      . diltiazem (CARDIZEM CD) 180 MG 24 hr capsule Take 180 mg by mouth daily.      Marland Kitchen estradiol (ESTRACE) 0.1 MG/GM vaginal cream Place 2 g vaginally 3 (three) times a week. Monday, Wednesday, friday      . fish oil-omega-3 fatty acids 1000 MG capsule Take 1 g by mouth daily.        . flecainide (TAMBOCOR) 50 MG tablet Take 50 mg by mouth every 12 (twelve) hours.      Marland Kitchen LORazepam (ATIVAN) 0.5 MG tablet Take 0.5 mg by mouth at bedtime as needed. For sleep      . rosuvastatin (CRESTOR) 5 MG tablet Take 2.5 mg by mouth See admin instructions. Monday-Friday      . sertraline (ZOLOFT) 50 MG tablet Take 50 mg by mouth every morning.       . Vitamin  D, Ergocalciferol, (DRISDOL) 50000 UNITS CAPS Take 50,000 Units by mouth every 7 (seven) days. wednesday        Allergies  Allergen Reactions  . Sulfonamide Derivatives Rash    "it was all over my legs"  . Citalopram Hydrobromide     Pt does not remember reaction    Past Medical History  Diagnosis Date  . History of aortic insufficiency   . Anxiety   . Hypertension   . Pacemaker   . PAF (paroxysmal atrial fibrillation)   . SSS (sick sinus syndrome)   . Dysrhythmia, cardiac     "palpitations"  . Type II diabetes mellitus     "controlled w/diet"  . Skin cancer ?1960's    "between breasts"  . Arthritis     "hands; between shoulders; back; feet"  . Panic attacks     "since losing husband 1994"  . Depression   . High risk medication use     on Flecainide  . Chronic anticoagulation     Past Surgical History  Procedure Date  . Cesarean section 1955  . US echocardiography 07/25/2006    EF 55-60%  . US echocardiography 04/27/2004    EF  55-60%  . Cardiovascular stress test 12/09/2004  . Cataract extraction w/ intraocular lens  implant, bilateral 1990's  . Tonsillectomy     "school age"  . Appendectomy 1955  . Dilation and curettage of uterus 1953  . Breast cyst excision ~ 1950    right  . Insert / replace / remove pacemaker 03/16/2006  . Skin cancer excision ?1960's    "between my breasts"    History  Smoking status  . Never Smoker   Smokeless tobacco  . Never Used    History  Alcohol Use No    Family History  Problem Relation Age of Onset  . Emphysema Father     Review of Systems: The review of systems is per the HPI.  All other systems were reviewed and are negative.  Physical Exam: BP 124/72  Pulse 64  Ht 5\' 2"  (1.575 m)  Wt 123 lb 6.4 oz (55.974 kg)  BMI 22.57 kg/m2 Patient is very pleasant and in no acute distress. She looks younger than her stated age. Skin is warm and dry. Color is normal.  HEENT is unremarkable. Normocephalic/atraumatic. PERRL.  Sclera are nonicteric. Neck is supple. No masses. No JVD. Lungs are clear. Cardiac exam shows a regular rate and rhythm. Abdomen is soft. Extremities are without edema. Gait and ROM are intact. No gross neurologic deficits noted.   LABORATORY DATA: Her EKG today shows A pacing. She is in sinus rhythm.   Pacemaker check has been performed by [facility] with [employer]. He has reprogrammed her back to MVP mode without rate response. She remains at [facility]. Has had some recurrent atrial fib but currently is in sinus.    Lab Results  Component Value Date   WBC 10.0 07/21/2011   HGB 12.1 07/21/2011   HCT 36.0 07/21/2011   PLT 152 07/21/2011   GLUCOSE 119* 07/21/2011   ALT 19 07/21/2011   AST 23 07/21/2011   NA 142 07/21/2011   K 3.8 07/21/2011   CL 109 07/21/2011   CREATININE 0.64 07/21/2011   BUN 19 07/21/2011   CO2 25 07/21/2011   INR 1.25 07/23/2011   Echo Study Conclusions from June 2013  - Left ventricle: The cavity size was normal. Wall thickness was increased in a pattern of mild LVH. Systolic function was normal. The estimated ejection fraction was in the range of 60% to 65%. Wall motion was normal; there were no regional wall motion abnormalities. Left ventricular diastolic function parameters were normal. - Aortic valve: Moderate regurgitation. - Mitral valve: Calcified annulus. Mildly thickened leaflets . - Atrial septum: No defect or patent foramen ovale was identified. - Inferior vena cava: The vessel was normal in size; the respirophasic diameter changes were in the normal range (= 50%); findings are consistent with normal central venous pressure. - Pericardium, extracardiac: There was no pericardial effusion. Impressions:  - The right ventricular systolic pressure was increased consistent with mild pulmonary hypertension.     Assessment / Plan:

## 2011-08-02 ENCOUNTER — Other Ambulatory Visit: Payer: Self-pay | Admitting: *Deleted

## 2011-08-02 DIAGNOSIS — Z45018 Encounter for adjustment and management of other part of cardiac pacemaker: Secondary | ICD-10-CM | POA: Diagnosis not present

## 2011-08-02 DIAGNOSIS — I359 Nonrheumatic aortic valve disorder, unspecified: Secondary | ICD-10-CM | POA: Diagnosis not present

## 2011-08-02 DIAGNOSIS — I10 Essential (primary) hypertension: Secondary | ICD-10-CM | POA: Diagnosis not present

## 2011-08-02 DIAGNOSIS — I4891 Unspecified atrial fibrillation: Secondary | ICD-10-CM | POA: Diagnosis not present

## 2011-08-02 DIAGNOSIS — I495 Sick sinus syndrome: Secondary | ICD-10-CM | POA: Diagnosis not present

## 2011-08-02 DIAGNOSIS — Z79899 Other long term (current) drug therapy: Secondary | ICD-10-CM | POA: Diagnosis not present

## 2011-08-02 DIAGNOSIS — E119 Type 2 diabetes mellitus without complications: Secondary | ICD-10-CM | POA: Diagnosis not present

## 2011-08-02 MED ORDER — CEFAZOLIN SODIUM-DEXTROSE 2-3 GM-% IV SOLR
2.0000 g | INTRAVENOUS | Status: AC
  Filled 2011-08-02: qty 50

## 2011-08-02 MED ORDER — SODIUM CHLORIDE 0.9 % IR SOLN
80.0000 mg | Status: AC
  Filled 2011-08-02: qty 2

## 2011-08-02 MED ORDER — CHLORHEXIDINE GLUCONATE 4 % EX LIQD
60.0000 mL | Freq: Once | CUTANEOUS | Status: DC
  Filled 2011-08-02: qty 60

## 2011-08-03 ENCOUNTER — Ambulatory Visit (HOSPITAL_COMMUNITY): Payer: Medicare Other

## 2011-08-03 ENCOUNTER — Encounter (HOSPITAL_COMMUNITY)
Admission: RE | Disposition: A | Discharge status: Home / Self Care | Payer: Self-pay | Source: Ambulatory Visit | Attending: Internal Medicine

## 2011-08-03 ENCOUNTER — Ambulatory Visit (HOSPITAL_COMMUNITY)
Admission: RE | Admit: 2011-08-03 | Discharge: 2011-08-03 | Disposition: A | Discharge status: Home / Self Care | Payer: Medicare Other | Source: Ambulatory Visit | Attending: Internal Medicine | Admitting: Internal Medicine

## 2011-08-03 DIAGNOSIS — I10 Essential (primary) hypertension: Secondary | ICD-10-CM | POA: Insufficient documentation

## 2011-08-03 DIAGNOSIS — Z01811 Encounter for preprocedural respiratory examination: Secondary | ICD-10-CM | POA: Diagnosis not present

## 2011-08-03 DIAGNOSIS — I4891 Unspecified atrial fibrillation: Secondary | ICD-10-CM | POA: Insufficient documentation

## 2011-08-03 DIAGNOSIS — Z79899 Other long term (current) drug therapy: Secondary | ICD-10-CM | POA: Insufficient documentation

## 2011-08-03 DIAGNOSIS — I359 Nonrheumatic aortic valve disorder, unspecified: Secondary | ICD-10-CM | POA: Insufficient documentation

## 2011-08-03 DIAGNOSIS — E119 Type 2 diabetes mellitus without complications: Secondary | ICD-10-CM | POA: Insufficient documentation

## 2011-08-03 DIAGNOSIS — I498 Other specified cardiac arrhythmias: Secondary | ICD-10-CM | POA: Diagnosis not present

## 2011-08-03 DIAGNOSIS — I495 Sick sinus syndrome: Secondary | ICD-10-CM | POA: Diagnosis present

## 2011-08-03 DIAGNOSIS — I480 Paroxysmal atrial fibrillation: Secondary | ICD-10-CM | POA: Diagnosis present

## 2011-08-03 DIAGNOSIS — Z45018 Encounter for adjustment and management of other part of cardiac pacemaker: Secondary | ICD-10-CM | POA: Insufficient documentation

## 2011-08-03 HISTORY — PX: PERMANENT PACEMAKER GENERATOR CHANGE: SHX6022

## 2011-08-03 LAB — SURGICAL PCR SCREEN
MRSA, PCR: NEGATIVE
Staphylococcus aureus: NEGATIVE

## 2011-08-03 LAB — GLUCOSE, CAPILLARY: Glucose-Capillary: 169 mg/dL — ABNORMAL HIGH (ref 70–99)

## 2011-08-03 SURGERY — PERMANENT PACEMAKER GENERATOR CHANGE

## 2011-08-03 MED ORDER — ACETAMINOPHEN 325 MG PO TABS
325.0000 mg | ORAL_TABLET | ORAL | Status: DC | PRN
  Filled 2011-08-03: qty 2

## 2011-08-03 MED ORDER — LIDOCAINE HCL (PF) 1 % IJ SOLN
INTRAMUSCULAR | Status: AC
  Filled 2011-08-03: qty 60

## 2011-08-03 MED ORDER — SODIUM CHLORIDE 0.9 % IV SOLN: 250.0000 mL | INTRAVENOUS | Status: DC | PRN

## 2011-08-03 MED ORDER — SODIUM CHLORIDE 0.45 % IV SOLN
INTRAVENOUS | Status: DC
  Administered 2011-08-03: 08:00:00 via INTRAVENOUS

## 2011-08-03 MED ORDER — CEFAZOLIN SODIUM-DEXTROSE 2-3 GM-% IV SOLR
INTRAVENOUS | Status: AC
  Filled 2011-08-03: qty 50

## 2011-08-03 MED ORDER — MUPIROCIN 2 % EX OINT
TOPICAL_OINTMENT | CUTANEOUS | Status: AC
  Administered 2011-08-03: 1 via NASAL
  Filled 2011-08-03: qty 22

## 2011-08-03 MED ORDER — HYDROCODONE-ACETAMINOPHEN 5-325 MG PO TABS
1.0000 | ORAL_TABLET | ORAL | Status: DC | PRN
  Filled 2011-08-03: qty 2

## 2011-08-03 MED ORDER — SODIUM CHLORIDE 0.9 % IJ SOLN: 3.0000 mL | INTRAMUSCULAR | Status: DC | PRN

## 2011-08-03 MED ORDER — HEPARIN (PORCINE) IN NACL 2-0.9 UNIT/ML-% IJ SOLN
INTRAMUSCULAR | Status: AC
  Filled 2011-08-03: qty 1000

## 2011-08-03 MED ORDER — ONDANSETRON HCL 4 MG/2ML IJ SOLN: 4.0000 mg | Freq: Four times a day (QID) | INTRAMUSCULAR | Status: DC | PRN

## 2011-08-03 MED ORDER — SODIUM CHLORIDE 0.9 % IJ SOLN: 3.0000 mL | Freq: Two times a day (BID) | INTRAMUSCULAR | Status: DC

## 2011-08-03 NOTE — Interval H&P Note (Signed)
History and Physical Interval Note:  08/03/2011 7:31 AM  Jo [name]  has presented today for surgery, with the diagnosis of afib/eri  The various methods of treatment have been discussed with the patient and family. After consideration of risks, benefits and other options for treatment, the patient has consented to  Procedure(s) (LRB): PERMANENT PACEMAKER INSERTION (N/A) as a surgical intervention .  The patient's history has been reviewed, patient examined, no change in status, stable for surgery.  I have reviewed the patients' chart and labs.  Questions were answered to the patient's satisfaction.     Hillis Range

## 2011-08-03 NOTE — H&P (View-Only) (Signed)
 Gerline M Fogel Date of Birth: 12/24/1917 Medical Record #[id number]  History of Present Illness: Ms. Ure is seen today for her 3 month check as well as a post hospital visit. She is seen for Dr. Jordan. She is 78 years of age. Has PAF with underlying pacemaker in place. Her pacemaker is now at [facility] and she has planned replacement for early next week. She was just discharged after being admitted with atrial fib with RVR. Her pacemaker is currently programmed to VVI. She is now on anticoagulation with Apixaban given her increased risk of stroke as well as Flecainide. Other issues include HTN, aortic insufficiency, and advanced age.  She comes in today. She is here with her daughter.  She is doing well. Still a little weak. Less palpitations reported. She is for a generator change out next week with Dr. Allred. No chest pain. She has been constipated and using stool softeners. No bleeding or bruising reported.   Current Outpatient Prescriptions on File Prior to Visit  Medication Sig Dispense Refill  . apixaban (ELIQUIS) 2.5 MG TABS tablet Take 2.5 mg by mouth 2 (two) times daily.      . Calcium Carbonate (CALCIUM 600 PO) Take 600 mg by mouth daily.        . carvedilol (COREG) 6.25 MG tablet Take 1.56 mg by mouth 2 (two) times daily with a meal. 1/4 of a tablet      . diltiazem (CARDIZEM CD) 180 MG 24 hr capsule Take 180 mg by mouth daily.      . estradiol (ESTRACE) 0.1 MG/GM vaginal cream Place 2 g vaginally 3 (three) times a week. Monday, Wednesday, friday      . fish oil-omega-3 fatty acids 1000 MG capsule Take 1 g by mouth daily.        . flecainide (TAMBOCOR) 50 MG tablet Take 50 mg by mouth every 12 (twelve) hours.      . LORazepam (ATIVAN) 0.5 MG tablet Take 0.5 mg by mouth at bedtime as needed. For sleep      . rosuvastatin (CRESTOR) 5 MG tablet Take 2.5 mg by mouth See admin instructions. Monday-Friday      . sertraline (ZOLOFT) 50 MG tablet Take 50 mg by mouth every morning.       . Vitamin  D, Ergocalciferol, (DRISDOL) 50000 UNITS CAPS Take 50,000 Units by mouth every 7 (seven) days. wednesday        Allergies  Allergen Reactions  . Sulfonamide Derivatives Rash    "it was all over my legs"  . Citalopram Hydrobromide     Pt does not remember reaction    Past Medical History  Diagnosis Date  . History of aortic insufficiency   . Anxiety   . Hypertension   . Pacemaker   . PAF (paroxysmal atrial fibrillation)   . SSS (sick sinus syndrome)   . Dysrhythmia, cardiac     "palpitations"  . Type II diabetes mellitus     "controlled w/diet"  . Skin cancer ?1960's    "between breasts"  . Arthritis     "hands; between shoulders; back; feet"  . Panic attacks     "since losing husband 1994"  . Depression   . High risk medication use     on Flecainide  . Chronic anticoagulation     Past Surgical History  Procedure Date  . Cesarean section 1955  . Us echocardiography 07/25/2006    EF 55-60%  . Us echocardiography 04/27/2004    EF   55-60%  . Cardiovascular stress test 12/09/2004  . Cataract extraction w/ intraocular lens  implant, bilateral 1990's  . Tonsillectomy     "school age"  . Appendectomy 1955  . Dilation and curettage of uterus 1953  . Breast cyst excision ~ 1950    right  . Insert / replace / remove pacemaker 03/16/2006  . Skin cancer excision ?1960's    "between my breasts"    History  Smoking status  . Never Smoker   Smokeless tobacco  . Never Used    History  Alcohol Use No    Family History  Problem Relation Age of Onset  . Emphysema Father     Review of Systems: The review of systems is per the HPI.  All other systems were reviewed and are negative.  Physical Exam: BP 124/72  Pulse 64  Ht 5' 2" (1.575 m)  Wt 123 lb 6.4 oz (55.974 kg)  BMI 22.57 kg/m2 Patient is very pleasant and in no acute distress. She looks younger than her stated age. Skin is warm and dry. Color is normal.  HEENT is unremarkable. Normocephalic/atraumatic. PERRL.  Sclera are nonicteric. Neck is supple. No masses. No JVD. Lungs are clear. Cardiac exam shows a regular rate and rhythm. Abdomen is soft. Extremities are without edema. Gait and ROM are intact. No gross neurologic deficits noted.   LABORATORY DATA: Her EKG today shows A pacing. She is in sinus rhythm.   Pacemaker check has been performed by Travis with [employer]. He has reprogrammed her back to MVP mode without rate response. She remains at [facility]. Has had some recurrent atrial fib but currently is in sinus.    Lab Results  Component Value Date   WBC 10.0 07/21/2011   HGB 12.1 07/21/2011   HCT 36.0 07/21/2011   PLT 152 07/21/2011   GLUCOSE 119* 07/21/2011   ALT 19 07/21/2011   AST 23 07/21/2011   NA 142 07/21/2011   K 3.8 07/21/2011   CL 109 07/21/2011   CREATININE 0.64 07/21/2011   BUN 19 07/21/2011   CO2 25 07/21/2011   INR 1.25 07/23/2011   Echo Study Conclusions from June 2013  - Left ventricle: The cavity size was normal. Wall thickness was increased in a pattern of mild LVH. Systolic function was normal. The estimated ejection fraction was in the range of 60% to 65%. Wall motion was normal; there were no regional wall motion abnormalities. Left ventricular diastolic function parameters were normal. - Aortic valve: Moderate regurgitation. - Mitral valve: Calcified annulus. Mildly thickened leaflets . - Atrial septum: No defect or patent foramen ovale was identified. - Inferior vena cava: The vessel was normal in size; the respirophasic diameter changes were in the normal range (= 50%); findings are consistent with normal central venous pressure. - Pericardium, extracardiac: There was no pericardial effusion. Impressions:  - The right ventricular systolic pressure was increased consistent with mild pulmonary hypertension.     Assessment / Plan:  

## 2011-08-03 NOTE — Op Note (Signed)
SURGEON:  Hillis Range, MD     PREPROCEDURE DIAGNOSES:   1. Sick sinus syndrome.   2. Paroxsymal Atrial fibrillation.     POSTPROCEDURE DIAGNOSES:   1. Sick sinus syndrome.   2. Paroxsymal Atrial fibrillation.     PROCEDURES:   1. Pacemaker pulse generator replacement.   2. Skin pocket revision.     INTRODUCTION:  Jo [name] is a 78 y.o. female with a history of sick sinus syndrome. She has done well since his pacemaker was implanted.  She has recently reached [facility] battery status.  She presents today for pacemaker pulse generator replacement.       DESCRIPTION OF THE PROCEDURE:  Informed written consent was obtained, and the patient was brought to the electrophysiology lab in the fasting state.  The patient's pacemaker was interrogated today and found to be at elective replacement indicator battery status.  The patient required no sedation for the procedure today.  The patient's left chest was prepped and draped in the usual sterile fashion by the EP lab staff.  The skin overlying the existing pacemaker was infiltrated with lidocaine for local analgesia.  A 4-cm incision was made over the pacemaker pocket.  Using a combination of sharp and blunt dissection, the pacemaker was exposed and removed from the body.  The device was disconnected from the leads. A single silk suture was identified and removed which had secured the device to the pectoralis fascia.  There was no foreign matter or debris within the pocket.  The atrial lead was confirmed to be a [employer] model [id number] (serial number [id number]) lead implanted on 03/16/06.  The right ventricular lead was confirmed to be a [employer] model [id number] (serial number [id number]) lead implanted on the same date as the atrial lead (above).  Both leads were examined and their integrity was confirmed to be intact.  Atrial lead P-waves measured 1.4 mV with impedance of 471 ohms and a threshold of 0.5 V at 0.5 msec.  Right ventricular lead R-waves measured  12.6 mV with impedance of 470 ohms and a threshold of 0.4 V at 0.5 msec.  Both leads were connected to a [employer] adapt L model ADDDR 1 (serial number [id number]) pacemaker.  The pocket was revised to accommodate this new device.  Electrocautery was required to assure hemostasis.  The pocket was irrigated with copious gentamicin solution. The pacemaker was then placed into the pocket.  The pocket was then closed in 2 layers with 2-0 Vicryl suture over the subcutaneous and subcuticular layers.  Steri-Strips and a sterile dressing were then applied.  There were no early apparent complications.     CONCLUSIONS:   1. Successful pacemaker pulse generator replacement for elective replacement indicator battery status   2. No early apparent complications.     Hillis Range, MD 08/03/2011 9:28 AM

## 2011-08-11 NOTE — Discharge Summary (Signed)
Patient ID: Jo [name] MRN: [id number] DOB/AGE: 10/15/1917 78 y.o.   Admit date: 07/21/2011 Discharge date: 07/23/2011   Primary Discharge Diagnosis: Atrial Fibrillation Secondary Discharge Diagnosis: 1. Chest pain 2. Tachybrady s/p pacemaker (Medtronic.PPM Model Number: [id number] PPM Serial Number: [id number]  PPM DOI: 03/16/2006)  3. Hypertension 4. Anxiety 5. Type II diabetes     Significant Diagnostic Studies: Echocardiogram competed day of discharge. Results pending.   Consults: None   Hospital Course: Jo [name] is a 78 year old patient followed by Dr. Sharrell Ku with a history of paroxysmal atrial fibrillation tachybradycardia syndrome status post pacemaker hypertension and diet controlled diabetes. She was noted to initially by the hospitalist service with palpitation and found to be in atrial fib flutter with RVR. She was placed on IV Cardizem and reverted back to normal sinus rhythm. We are asked to see initially in consult and take over her care from the hospitalist service.    Her Medtronic pacemaker is at [facility] and was reprogrammed to VVI her pacemaker representative and after being seen by Dr. Sharrell Ku. She was started on Apixaban 2.5 mg BID and flecainide 50 mg twice a day and continued on Cardizem 180 mg daily [p.o. after transition off of IV. Rate was controlled. Stroke risk was found to be very high. Aspirin was stopped prior to discharge,  however. She is scheduled for generator change on 08/03/2011 by Dr. Johney Frame. She has been advised to hold Crixivan the night before and the morning of the procedure. She will be seen by physician assistant in the [facility] office on one week followup after discharge today. Echocardiogram was completed prior to discharge the results are pending at this time. It will  be reviewed with the patient on followup. She was seen and examined by Dr. Hillis Range prior to discharge and found to be stable. She is a nervous person and multiple  questions were answered. She is willing to followup in one week and proceed with planned generator change.       Discharge Exam: Blood pressure 136/78, pulse 65, temperature 98.5 F (36.9 C), temperature source Oral, resp. rate 16, height 5\' 4"  (1.626 m), weight 125 lb (56.7 kg), SpO2 95.00%.       Labs:   Lab Results   Component  Value  Date     WBC  10.0  07/21/2011     HGB  12.1  07/21/2011     HCT  36.0  07/21/2011     MCV  91.6  07/21/2011     PLT  152  07/21/2011    Lab  07/21/11 0500  07/21/11 0029   NA  142  --   K  3.8  --   CL  109  --   CO2  25  --   BUN  19  --   CREATININE  0.64  --   CALCIUM  9.2  --   PROT  --  6.8   BILITOT  --  0.4   ALKPHOS  --  57   ALT  --  19   AST  --  23   GLUCOSE  119*  --           Radiology: Dg Chest Portable 1 View   07/21/2011  *RADIOLOGY REPORT*  Clinical Data: Tachycardia.  PORTABLE CHEST - 1 VIEW  Comparison: 03/17/2006  Findings: Stable appearance of cardiac pacemaker.  Shallow inspiration.  Borderline heart size with normal pulmonary vascularity.  Interstitial changes in the lungs  likely to represent fibrosis.  No focal airspace consolidation.  No blunting of costophrenic angles.  No pneumothorax.  Calcification of the aorta. Degenerative changes in the thoracic spine.  No significant change since previous study. IMPRESSION: No evidence of active pulmonary disease.  Original Report Authenticated By: Marlon Pel, M.D.      [id number] Atrial flutter with variable A-V block with occasional ventricular-paced complexes Nonspecific ST and T wave abnormality Compared to a prior EKG, flutter has replaced sinus rhythm   FOLLOW UP PLANS AND APPOINTMENTS Discharge Orders      Future Orders  Please Complete By  Expires     Diet - low sodium heart healthy          Increase activity slowly            Medication List   As of 07/23/2011 11:43 AM     STOP taking these medications              aspirin 325 MG tablet                  TAKE these medications              apixaban 2.5 MG Tabs tablet     Commonly known as: ELIQUIS     Take 1 tablet (2.5 mg total) by mouth 2 (two) times daily.           CALCIUM 600 PO     Take 600 mg by mouth daily.           carvedilol 6.25 MG tablet     Commonly known as: COREG     Take 3.125 mg by mouth 2 (two) times daily with a meal.           diltiazem 180 MG 24 hr capsule     Commonly known as: CARDIZEM CD     Take 1 capsule (180 mg total) by mouth daily.           DSS 100 MG Caps     Take 100 mg by mouth 2 (two) times daily as needed for constipation.           estradiol 0.1 MG/GM vaginal cream     Commonly known as: ESTRACE     Place 2 g vaginally 3 (three) times a week. Monday, Wednesday, friday           fish oil-omega-3 fatty acids 1000 MG capsule     Take 1 g by mouth daily.           flecainide 50 MG tablet     Commonly known as: TAMBOCOR     Take 1 tablet (50 mg total) by mouth every 12 (twelve) hours.           LORazepam 0.5 MG tablet     Commonly known as: ATIVAN     Take 0.5 mg by mouth as needed. For anxiety           RECLAST 5 MG/100ML Soln     Generic drug: zoledronic acid     Inject 5 mg into the vein See admin instructions. yearly           rosuvastatin 5 MG tablet     Commonly known as: CRESTOR     Take 2.5 mg by mouth daily.           sertraline 50 MG tablet     Commonly known as: ZOLOFT     Take 50  mg by mouth daily.           Vitamin D (Ergocalciferol) 50000 UNITS Caps     Commonly known as: DRISDOL     Take 50,000 Units by mouth every 7 (seven) days. wednesday                  Follow-up Information      Follow up with Tereso Newcomer, PA. (Our office will call you for appointment in one week.)      Contact information:     [address]  [address] [place] [address] [phone]                   Time spent with patient to include physician time: 35  minutes Signed: Joni Reining 07/23/2011, 11:43 AM Co-Sign MD       I have seen, examined the patient, and reviewed the above assessment and plan.  Changes to above are made where necessary.    Co Sign: Hillis Range, MD

## 2011-08-17 ENCOUNTER — Ambulatory Visit (INDEPENDENT_AMBULATORY_CARE_PROVIDER_SITE_OTHER): Payer: Medicare Other | Admitting: *Deleted

## 2011-08-17 ENCOUNTER — Encounter: Payer: Self-pay | Admitting: Internal Medicine

## 2011-08-17 DIAGNOSIS — I495 Sick sinus syndrome: Secondary | ICD-10-CM | POA: Diagnosis not present

## 2011-08-17 DIAGNOSIS — I4891 Unspecified atrial fibrillation: Secondary | ICD-10-CM | POA: Diagnosis not present

## 2011-08-17 LAB — PACEMAKER DEVICE OBSERVATION
AL IMPEDENCE PM: 582 Ohm
AL THRESHOLD: 0.25 V
BATTERY VOLTAGE: 2.8 V
RV LEAD AMPLITUDE: 8 mv
RV LEAD IMPEDENCE PM: 427 Ohm

## 2011-08-17 NOTE — Progress Notes (Signed)
Wound check pacer in clinic  

## 2011-08-31 ENCOUNTER — Ambulatory Visit (INDEPENDENT_AMBULATORY_CARE_PROVIDER_SITE_OTHER): Payer: Medicare Other | Admitting: Nurse Practitioner

## 2011-08-31 ENCOUNTER — Encounter: Payer: Self-pay | Admitting: Nurse Practitioner

## 2011-08-31 VITALS — BP 130/62 | HR 85 | Ht 62.0 in | Wt 122.8 lb

## 2011-08-31 DIAGNOSIS — I495 Sick sinus syndrome: Secondary | ICD-10-CM

## 2011-08-31 DIAGNOSIS — E119 Type 2 diabetes mellitus without complications: Secondary | ICD-10-CM | POA: Diagnosis not present

## 2011-08-31 DIAGNOSIS — I4891 Unspecified atrial fibrillation: Secondary | ICD-10-CM

## 2011-08-31 DIAGNOSIS — I480 Paroxysmal atrial fibrillation: Secondary | ICD-10-CM

## 2011-08-31 DIAGNOSIS — I10 Essential (primary) hypertension: Secondary | ICD-10-CM | POA: Diagnosis not present

## 2011-08-31 NOTE — Assessment & Plan Note (Signed)
She has had her generator replaced. She is doing well.

## 2011-08-31 NOTE — Assessment & Plan Note (Signed)
Blood pressure looks good. No change in her current therapy.

## 2011-08-31 NOTE — Progress Notes (Signed)
Karelly Dewalt Deruiter Date of Birth: 1917/09/15 Medical Record #[id number]  History of Present Illness: Ms. Palomino is seen today for a one month check. It is also a post hospital visit. She is seen for Dr. Johney Frame. She is 78 years of age. She has PAF with underlying pacemaker. Her generator was just changed out by Dr. Johney Frame and she did well with that. She is on Flecainide and Eliquis. Other issues include HTn, aortic insufficiency and advanced age.  She comes in today. She is here with her son. She is doing quite well. She feels pretty good. She did have a spell yesterday after rolling her hair where she got very sweaty. She had not eaten a good lunch and I suspect her blood sugar dropped. She is fine today. No chest pain. Her pacemaker site is doing fine. She feels ok on her medicines.   Current Outpatient Prescriptions on File Prior to Visit  Medication Sig Dispense Refill  . apixaban (ELIQUIS) 2.5 MG TABS tablet Take 2.5 mg by mouth 2 (two) times daily.      . Calcium Carbonate (CALCIUM 600 PO) Take 600 mg by mouth daily.        . carvedilol (COREG) 6.25 MG tablet Take 6.25 mg by mouth 2 (two) times daily with a meal.       . diltiazem (CARDIZEM CD) 180 MG 24 hr capsule Take 180 mg by mouth daily.      Marland Kitchen estradiol (ESTRACE) 0.1 MG/GM vaginal cream Place 2 g vaginally 3 (three) times a week. Monday, Wednesday, friday      . fish oil-omega-3 fatty acids 1000 MG capsule Take 1 g by mouth daily.        . flecainide (TAMBOCOR) 50 MG tablet Take 50 mg by mouth every 12 (twelve) hours.      Marland Kitchen LORazepam (ATIVAN) 0.5 MG tablet Take 0.5 mg by mouth at bedtime as needed. For sleep      . rosuvastatin (CRESTOR) 5 MG tablet Take 2.5 mg by mouth See admin instructions. Monday-Friday      . sertraline (ZOLOFT) 50 MG tablet Take 50 mg by mouth every morning.       . Vitamin D, Ergocalciferol, (DRISDOL) 50000 UNITS CAPS Take 50,000 Units by mouth every 7 (seven) days. wednesday      . zoledronic acid (RECLAST) 5  MG/100ML SOLN Inject 5 mg into the vein once as needed.        Allergies  Allergen Reactions  . Sulfonamide Derivatives Rash    "it was all over my legs"  . Citalopram Hydrobromide     Pt does not remember reaction    Past Medical History  Diagnosis Date  . History of aortic insufficiency   . Anxiety   . Hypertension   . Pacemaker   . PAF (paroxysmal atrial fibrillation)   . SSS (sick sinus syndrome)   . Dysrhythmia, cardiac     "palpitations"  . Type II diabetes mellitus     "controlled w/diet"  . Skin cancer ?1960's    "between breasts"  . Arthritis     "hands; between shoulders; back; feet"  . Panic attacks     "since losing husband 1994"  . Depression   . High risk medication use     on Flecainide  . Chronic anticoagulation     Past Surgical History  Procedure Date  . Cesarean section 1955  . US echocardiography 07/25/2006    EF 55-60%  . US echocardiography 04/27/2004  EF 55-60%  . Cardiovascular stress test 12/09/2004  . Cataract extraction w/ intraocular lens  implant, bilateral 1990's  . Tonsillectomy     "school age"  . Appendectomy 1955  . Dilation and curettage of uterus 1953  . Breast cyst excision ~ 1950    right  . Insert / replace / remove pacemaker 03/16/2006  . Skin cancer excision ?1960's    "between my breasts"    History  Smoking status  . Never Smoker   Smokeless tobacco  . Never Used    History  Alcohol Use No    Family History  Problem Relation Age of Onset  . Emphysema Father     Review of Systems: The review of systems is per the HPI.  All other systems were reviewed and are negative.  Physical Exam: BP 130/62  Pulse 85  Ht 5\' 2"  (1.575 m)  Wt 122 lb 12.8 oz (55.702 kg)  BMI 22.46 kg/m2 Patient is very pleasant and in no acute distress. Skin is warm and dry. Color is normal.  HEENT is unremarkable. Normocephalic/atraumatic. PERRL. Sclera are nonicteric. Neck is supple. No masses. No JVD. Lungs are clear. Cardiac  exam shows a regular rate and rhythm. Pacemaker site in the left upper chest looks good. Abdomen is soft. Extremities are without edema. Gait and ROM are intact. No gross neurologic deficits noted.   LABORATORY DATA:   Assessment / Plan:

## 2011-08-31 NOTE — Assessment & Plan Note (Signed)
She is doing well. She remains on her Flecainide and Eliquis. Long term, we [place] have issues with cost. Will see if the pharmacist here can assist. She was seen also today by Dr. [place] and felt to be doing well. He will see her back in 3 to 4 months. Patient is agreeable to this plan and will call if any problems develop in the interim.

## 2011-08-31 NOTE — Patient Instructions (Addendum)
Stay on your current medicines  We will see you back in 4 months  Call the [facility] office at [phone] if you have any questions, problems or concerns.   

## 2011-08-31 NOTE — Assessment & Plan Note (Signed)
She had what sounds more like hypoglycemia yesterday. She will continue to monitor.

## 2011-09-01 ENCOUNTER — Other Ambulatory Visit: Payer: Self-pay | Admitting: Pharmacist

## 2011-09-01 MED ORDER — APIXABAN 2.5 MG PO TABS: 2.5000 mg | ORAL_TABLET | Freq: Two times a day (BID) | ORAL | Status: DC

## 2011-10-17 DIAGNOSIS — Z23 Encounter for immunization: Secondary | ICD-10-CM | POA: Diagnosis not present

## 2011-10-17 DIAGNOSIS — M810 Age-related osteoporosis without current pathological fracture: Secondary | ICD-10-CM | POA: Diagnosis not present

## 2011-10-17 DIAGNOSIS — E119 Type 2 diabetes mellitus without complications: Secondary | ICD-10-CM | POA: Diagnosis not present

## 2011-10-17 DIAGNOSIS — I4891 Unspecified atrial fibrillation: Secondary | ICD-10-CM | POA: Diagnosis not present

## 2011-10-17 DIAGNOSIS — I10 Essential (primary) hypertension: Secondary | ICD-10-CM | POA: Diagnosis not present

## 2011-11-04 DIAGNOSIS — H353190 Nonexudative age-related macular degeneration, unspecified eye, stage unspecified: Secondary | ICD-10-CM | POA: Diagnosis not present

## 2011-11-04 DIAGNOSIS — H2640 Unspecified secondary cataract: Secondary | ICD-10-CM | POA: Diagnosis not present

## 2011-11-04 DIAGNOSIS — Z961 Presence of intraocular lens: Secondary | ICD-10-CM | POA: Diagnosis not present

## 2011-11-04 DIAGNOSIS — H02839 Dermatochalasis of unspecified eye, unspecified eyelid: Secondary | ICD-10-CM | POA: Diagnosis not present

## 2011-11-07 ENCOUNTER — Other Ambulatory Visit (HOSPITAL_COMMUNITY): Payer: Self-pay | Admitting: *Deleted

## 2011-11-09 ENCOUNTER — Encounter (HOSPITAL_COMMUNITY)
Admission: RE | Admit: 2011-11-09 | Discharge: 2011-11-09 | Disposition: A | Discharge status: Home / Self Care | Payer: Medicare Other | Source: Ambulatory Visit | Attending: Internal Medicine | Admitting: Internal Medicine

## 2011-11-09 ENCOUNTER — Encounter (HOSPITAL_COMMUNITY): Payer: Self-pay

## 2011-11-09 DIAGNOSIS — M810 Age-related osteoporosis without current pathological fracture: Secondary | ICD-10-CM | POA: Diagnosis not present

## 2011-11-09 HISTORY — DX: Age-related osteoporosis without current pathological fracture: M81.0

## 2011-11-09 MED ORDER — ZOLEDRONIC ACID 5 MG/100ML IV SOLN
5.0000 mg | Freq: Once | INTRAVENOUS | Status: AC
  Administered 2011-11-09: 5 mg via INTRAVENOUS
  Filled 2011-11-09: qty 100

## 2011-11-09 MED ORDER — SODIUM CHLORIDE 0.9 % IV SOLN
Freq: Once | INTRAVENOUS | Status: AC
  Administered 2011-11-09: 15:00:00 via INTRAVENOUS

## 2012-01-03 ENCOUNTER — Ambulatory Visit (INDEPENDENT_AMBULATORY_CARE_PROVIDER_SITE_OTHER): Payer: Medicare Other | Admitting: Cardiology

## 2012-01-03 ENCOUNTER — Encounter: Payer: Self-pay | Admitting: Cardiology

## 2012-01-03 VITALS — BP 120/52 | HR 77 | Ht 62.0 in | Wt 126.4 lb

## 2012-01-03 DIAGNOSIS — Z8679 Personal history of other diseases of the circulatory system: Secondary | ICD-10-CM | POA: Diagnosis not present

## 2012-01-03 DIAGNOSIS — I480 Paroxysmal atrial fibrillation: Secondary | ICD-10-CM

## 2012-01-03 DIAGNOSIS — I10 Essential (primary) hypertension: Secondary | ICD-10-CM

## 2012-01-03 DIAGNOSIS — I4891 Unspecified atrial fibrillation: Secondary | ICD-10-CM

## 2012-01-03 DIAGNOSIS — I495 Sick sinus syndrome: Secondary | ICD-10-CM | POA: Diagnosis not present

## 2012-01-03 NOTE — Progress Notes (Signed)
Jo [name] Date of Birth: 04-22-1917 Medical Record #[id number]  History of Present Illness: Jo [name] is seen today for a followup visit. She is 78 years of age. She has PAF with underlying pacemaker. Her generator was just changed earlier this year. She is on Flecainide and Eliquis. Other issues include HTn, aortic insufficiency and advanced age. On followup today she is doing very well. She does have some minor palpitations mostly in the morning. These last less than 2-3 minutes. She's had no sustained episodes. This is consistent with her last pacemaker check.  Current Outpatient Prescriptions on File Prior to Visit  Medication Sig Dispense Refill  . apixaban (ELIQUIS) 2.5 MG TABS tablet Take 1 tablet (2.5 mg total) by mouth 2 (two) times daily.  60 tablet  5  . Calcium Carbonate (CALCIUM 600 PO) Take 600 mg by mouth daily.        . carvedilol (COREG) 6.25 MG tablet Take 3.125 mg by mouth 2 (two) times daily with a meal.       . diltiazem (CARDIZEM CD) 180 MG 24 hr capsule Take 180 mg by mouth daily.      Marland Kitchen docusate sodium (COLACE) 100 MG capsule Take 100 mg by mouth as needed.      Marland Kitchen estradiol (ESTRACE) 0.1 MG/GM vaginal cream Place 2 g vaginally 3 (three) times a week. Monday, Wednesday, friday      . fish oil-omega-3 fatty acids 1000 MG capsule Take 1 g by mouth daily.        . flecainide (TAMBOCOR) 50 MG tablet Take 50 mg by mouth every 12 (twelve) hours.      Marland Kitchen LORazepam (ATIVAN) 0.5 MG tablet Take 0.5 mg by mouth at bedtime as needed. For sleep      . rosuvastatin (CRESTOR) 5 MG tablet Take 2.5 mg by mouth See admin instructions. Monday-Friday      . sertraline (ZOLOFT) 50 MG tablet Take 50 mg by mouth every morning.       . Vitamin D, Ergocalciferol, (DRISDOL) 50000 UNITS CAPS Take 50,000 Units by mouth every 7 (seven) days. wednesday      . zoledronic acid (RECLAST) 5 MG/100ML SOLN Inject 5 mg into the vein once as needed.        Allergies  Allergen Reactions  . Sulfonamide  Derivatives Rash    "it was all over my legs"  . Citalopram Hydrobromide     Pt does not remember reaction    Past Medical History  Diagnosis Date  . History of aortic insufficiency   . Anxiety   . Hypertension   . Pacemaker   . PAF (paroxysmal atrial fibrillation)   . SSS (sick sinus syndrome)   . Type II diabetes mellitus     "controlled w/diet"  . Skin cancer ?1960's    "between breasts"  . Arthritis     "hands; between shoulders; back; feet"  . Panic attacks     "since losing husband 1994"  . Depression   . High risk medication use     on Flecainide  . Chronic anticoagulation   . Osteoporosis     Past Surgical History  Procedure Date  . Cesarean section 1955  . US echocardiography 07/25/2006    EF 55-60%  . US echocardiography 04/27/2004    EF 55-60%  . Cardiovascular stress test 12/09/2004  . Cataract extraction w/ intraocular lens  implant, bilateral 1990's  . Tonsillectomy     "school age"  . Appendectomy 1955  .  Dilation and curettage of uterus 1953  . Breast cyst excision ~ 1950    right  . Insert / replace / remove pacemaker 03/16/2006  . Skin cancer excision ?1960's    "between my breasts"    History  Smoking status  . Never Smoker   Smokeless tobacco  . Never Used    History  Alcohol Use No    Family History  Problem Relation Age of Onset  . Emphysema Father     Review of Systems: The review of systems is per the HPI.  All other systems were reviewed and are negative.  Physical Exam: BP 120/52  Pulse 77  Ht 5\' 2"  (1.575 m)  Wt 126 lb 6.4 oz (57.335 kg)  BMI 23.12 kg/m2 Patient is very pleasant and in no acute distress. Skin is warm and dry. Color is normal.  HEENT is unremarkable. Normocephalic/atraumatic. PERRL. Sclera are nonicteric. Neck is supple. No masses. No JVD. Lungs are clear. Cardiac exam shows a regular rate and rhythm. Pacemaker site in the left upper chest looks good. Abdomen is soft. Extremities are without edema. Gait  and ROM are intact. No gross neurologic deficits noted.   LABORATORY DATA: ECG demonstrates an atrial paced rhythm. It is otherwise normal.  Assessment / Plan: 1. Paroxysmal atrial fibrillation. This is well controlled on flecainide. She has a pacemaker in place. She is anticoagulated with Eliquis.  2. Hypertension, controlled.

## 2012-01-03 NOTE — Patient Instructions (Signed)
Continue your current therapy  I will see you again in 6 months.   

## 2012-02-16 ENCOUNTER — Other Ambulatory Visit: Payer: Self-pay | Admitting: Adult Health

## 2012-02-21 ENCOUNTER — Telehealth: Payer: Self-pay | Admitting: Cardiology

## 2012-02-21 MED ORDER — DILTIAZEM HCL ER COATED BEADS 180 MG PO CP24: 180.0000 mg | ORAL_CAPSULE | Freq: Every day | ORAL | Status: DC

## 2012-02-21 NOTE — Telephone Encounter (Signed)
Pt needs refill on cardizem called in

## 2012-03-07 ENCOUNTER — Other Ambulatory Visit: Payer: Self-pay | Admitting: *Deleted

## 2012-03-07 MED ORDER — APIXABAN 2.5 MG PO TABS: 2.5000 mg | ORAL_TABLET | Freq: Two times a day (BID) | ORAL | Status: DC

## 2012-03-14 DIAGNOSIS — E785 Hyperlipidemia, unspecified: Secondary | ICD-10-CM | POA: Diagnosis not present

## 2012-03-14 DIAGNOSIS — R82998 Other abnormal findings in urine: Secondary | ICD-10-CM | POA: Diagnosis not present

## 2012-03-14 DIAGNOSIS — I10 Essential (primary) hypertension: Secondary | ICD-10-CM | POA: Diagnosis not present

## 2012-03-14 DIAGNOSIS — E559 Vitamin D deficiency, unspecified: Secondary | ICD-10-CM | POA: Diagnosis not present

## 2012-03-14 DIAGNOSIS — E119 Type 2 diabetes mellitus without complications: Secondary | ICD-10-CM | POA: Diagnosis not present

## 2012-03-20 DIAGNOSIS — E079 Disorder of thyroid, unspecified: Secondary | ICD-10-CM | POA: Diagnosis not present

## 2012-03-20 DIAGNOSIS — F341 Dysthymic disorder: Secondary | ICD-10-CM | POA: Diagnosis not present

## 2012-03-20 DIAGNOSIS — I4891 Unspecified atrial fibrillation: Secondary | ICD-10-CM | POA: Diagnosis not present

## 2012-03-20 DIAGNOSIS — Z124 Encounter for screening for malignant neoplasm of cervix: Secondary | ICD-10-CM | POA: Diagnosis not present

## 2012-03-20 DIAGNOSIS — Z0000 Encounter for general adult medical examination without abnormal findings: Secondary | ICD-10-CM | POA: Diagnosis not present

## 2012-03-23 DIAGNOSIS — Z1212 Encounter for screening for malignant neoplasm of rectum: Secondary | ICD-10-CM | POA: Diagnosis not present

## 2012-06-04 ENCOUNTER — Emergency Department (HOSPITAL_COMMUNITY): Payer: Medicare Other

## 2012-06-04 ENCOUNTER — Observation Stay (HOSPITAL_COMMUNITY)
Admission: EM | Admit: 2012-06-04 | Discharge: 2012-06-05 | Disposition: A | Discharge status: Home / Self Care | Payer: Medicare Other | Attending: Cardiology | Admitting: Cardiology

## 2012-06-04 ENCOUNTER — Ambulatory Visit (INDEPENDENT_AMBULATORY_CARE_PROVIDER_SITE_OTHER): Payer: Medicare Other | Admitting: *Deleted

## 2012-06-04 ENCOUNTER — Encounter (HOSPITAL_COMMUNITY): Payer: Self-pay | Admitting: Emergency Medicine

## 2012-06-04 ENCOUNTER — Other Ambulatory Visit: Payer: Self-pay | Admitting: Internal Medicine

## 2012-06-04 DIAGNOSIS — I4891 Unspecified atrial fibrillation: Secondary | ICD-10-CM

## 2012-06-04 DIAGNOSIS — I359 Nonrheumatic aortic valve disorder, unspecified: Secondary | ICD-10-CM | POA: Diagnosis not present

## 2012-06-04 DIAGNOSIS — I495 Sick sinus syndrome: Secondary | ICD-10-CM | POA: Diagnosis not present

## 2012-06-04 DIAGNOSIS — I951 Orthostatic hypotension: Secondary | ICD-10-CM | POA: Diagnosis not present

## 2012-06-04 DIAGNOSIS — Z950 Presence of cardiac pacemaker: Secondary | ICD-10-CM | POA: Diagnosis not present

## 2012-06-04 DIAGNOSIS — R5383 Other fatigue: Secondary | ICD-10-CM | POA: Diagnosis not present

## 2012-06-04 DIAGNOSIS — F329 Major depressive disorder, single episode, unspecified: Secondary | ICD-10-CM | POA: Diagnosis not present

## 2012-06-04 DIAGNOSIS — I10 Essential (primary) hypertension: Secondary | ICD-10-CM | POA: Insufficient documentation

## 2012-06-04 DIAGNOSIS — F3289 Other specified depressive episodes: Secondary | ICD-10-CM | POA: Insufficient documentation

## 2012-06-04 DIAGNOSIS — M810 Age-related osteoporosis without current pathological fracture: Secondary | ICD-10-CM | POA: Diagnosis not present

## 2012-06-04 DIAGNOSIS — R5381 Other malaise: Secondary | ICD-10-CM | POA: Insufficient documentation

## 2012-06-04 DIAGNOSIS — F411 Generalized anxiety disorder: Secondary | ICD-10-CM | POA: Diagnosis not present

## 2012-06-04 DIAGNOSIS — R42 Dizziness and giddiness: Secondary | ICD-10-CM | POA: Diagnosis not present

## 2012-06-04 DIAGNOSIS — Z79899 Other long term (current) drug therapy: Secondary | ICD-10-CM | POA: Diagnosis not present

## 2012-06-04 DIAGNOSIS — R55 Syncope and collapse: Secondary | ICD-10-CM | POA: Diagnosis not present

## 2012-06-04 DIAGNOSIS — Z7901 Long term (current) use of anticoagulants: Secondary | ICD-10-CM | POA: Diagnosis not present

## 2012-06-04 DIAGNOSIS — E119 Type 2 diabetes mellitus without complications: Secondary | ICD-10-CM | POA: Insufficient documentation

## 2012-06-04 DIAGNOSIS — R404 Transient alteration of awareness: Secondary | ICD-10-CM | POA: Diagnosis not present

## 2012-06-04 LAB — COMPREHENSIVE METABOLIC PANEL
BUN: 22 mg/dL (ref 6–23)
CO2: 28 mEq/L (ref 19–32)
Calcium: 10.1 mg/dL (ref 8.4–10.5)
Chloride: 106 mEq/L (ref 96–112)
Creatinine, Ser: 0.72 mg/dL (ref 0.50–1.10)
GFR calc non Af Amer: 72 mL/min — ABNORMAL LOW (ref >90)
Total Bilirubin: 0.4 mg/dL (ref 0.3–1.2)

## 2012-06-04 LAB — URINALYSIS, ROUTINE W REFLEX MICROSCOPIC
Ketones, ur: NEGATIVE mg/dL
Nitrite: NEGATIVE
Protein, ur: NEGATIVE mg/dL
Urobilinogen, UA: 0.2 mg/dL (ref 0.0–1.0)

## 2012-06-04 LAB — CBC WITH DIFFERENTIAL/PLATELET
Basophils Relative: 1 % (ref 0–1)
Eosinophils Relative: 1 % (ref 0–5)
HCT: 38.6 % (ref 36.0–46.0)
Hemoglobin: 13.2 g/dL (ref 12.0–15.0)
Lymphocytes Relative: 25 % (ref 12–46)
MCHC: 34.2 g/dL (ref 30.0–36.0)
MCV: 92.1 fL (ref 78.0–100.0)
Monocytes Absolute: 0.7 10*3/uL (ref 0.1–1.0)
Monocytes Relative: 8 % (ref 3–12)
Neutro Abs: 5.6 10*3/uL (ref 1.7–7.7)
RDW: 12.9 % (ref 11.5–15.5)

## 2012-06-04 LAB — GLUCOSE, CAPILLARY: Glucose-Capillary: 217 mg/dL — ABNORMAL HIGH (ref 70–99)

## 2012-06-04 LAB — TROPONIN I: Troponin I: <0.3 ng/mL (ref <0.30)

## 2012-06-04 MED ORDER — SODIUM CHLORIDE 0.9 % IV SOLN: 250.0000 mL | INTRAVENOUS | Status: DC | PRN

## 2012-06-04 MED ORDER — CALCIUM CARBONATE 1250 (500 CA) MG PO TABS
1.0000 | ORAL_TABLET | Freq: Every day | ORAL | Status: DC
  Administered 2012-06-05: 500 mg via ORAL
  Filled 2012-06-04 (×2): qty 1

## 2012-06-04 MED ORDER — FLECAINIDE ACETATE 50 MG PO TABS
50.0000 mg | ORAL_TABLET | Freq: Two times a day (BID) | ORAL | Status: DC
  Administered 2012-06-04 – 2012-06-05 (×2): 50 mg via ORAL
  Filled 2012-06-04 (×3): qty 1

## 2012-06-04 MED ORDER — APIXABAN 2.5 MG PO TABS
2.5000 mg | ORAL_TABLET | Freq: Two times a day (BID) | ORAL | Status: DC
  Administered 2012-06-04 – 2012-06-05 (×2): 2.5 mg via ORAL
  Filled 2012-06-04 (×3): qty 1

## 2012-06-04 MED ORDER — CARVEDILOL 3.125 MG PO TABS: 3.1250 mg | ORAL_TABLET | Freq: Two times a day (BID) | ORAL | Status: DC

## 2012-06-04 MED ORDER — SODIUM CHLORIDE 0.9 % IJ SOLN
3.0000 mL | INTRAMUSCULAR | Status: DC | PRN
  Administered 2012-06-05: 3 mL via INTRAVENOUS

## 2012-06-04 MED ORDER — ESTRADIOL 0.1 MG/GM VA CREA
2.0000 g | TOPICAL_CREAM | VAGINAL | Status: DC
  Filled 2012-06-04: qty 42.5

## 2012-06-04 MED ORDER — SODIUM CHLORIDE 0.9 % IV BOLUS (SEPSIS)
500.0000 mL | Freq: Once | INTRAVENOUS | Status: AC
  Administered 2012-06-04: 500 mL via INTRAVENOUS

## 2012-06-04 MED ORDER — CARVEDILOL 3.125 MG PO TABS
3.1250 mg | ORAL_TABLET | Freq: Two times a day (BID) | ORAL | Status: DC
  Administered 2012-06-04 – 2012-06-05 (×2): 3.125 mg via ORAL
  Filled 2012-06-04 (×4): qty 1

## 2012-06-04 MED ORDER — ATORVASTATIN CALCIUM 10 MG PO TABS
10.0000 mg | ORAL_TABLET | ORAL | Status: DC
  Filled 2012-06-04: qty 1

## 2012-06-04 MED ORDER — CALCIUM CARBONATE 600 MG PO TABS: 600.0000 mg | ORAL_TABLET | Freq: Every day | ORAL | Status: DC

## 2012-06-04 MED ORDER — VITAMIN D (ERGOCALCIFEROL) 1.25 MG (50000 UNIT) PO CAPS: 50000.0000 [IU] | ORAL_CAPSULE | ORAL | Status: DC

## 2012-06-04 MED ORDER — FLUDROCORTISONE ACETATE 0.1 MG PO TABS
0.1000 mg | ORAL_TABLET | Freq: Every day | ORAL | Status: DC
  Administered 2012-06-04 – 2012-06-05 (×2): 0.1 mg via ORAL
  Filled 2012-06-04 (×2): qty 1

## 2012-06-04 MED ORDER — DOCUSATE SODIUM 100 MG PO CAPS
100.0000 mg | ORAL_CAPSULE | ORAL | Status: DC | PRN
  Filled 2012-06-04: qty 1

## 2012-06-04 MED ORDER — LORAZEPAM 0.5 MG PO TABS
0.5000 mg | ORAL_TABLET | Freq: Two times a day (BID) | ORAL | Status: DC | PRN
  Administered 2012-06-04: 0.5 mg via ORAL
  Filled 2012-06-04: qty 1

## 2012-06-04 MED ORDER — SODIUM CHLORIDE 0.9 % IJ SOLN: 3.0000 mL | Freq: Two times a day (BID) | INTRAMUSCULAR | Status: DC

## 2012-06-04 MED ORDER — DILTIAZEM HCL ER COATED BEADS 180 MG PO CP24
180.0000 mg | ORAL_CAPSULE | Freq: Every day | ORAL | Status: DC
  Administered 2012-06-05: 180 mg via ORAL
  Filled 2012-06-04: qty 1

## 2012-06-04 MED ORDER — ACETAMINOPHEN 325 MG PO TABS: 650.0000 mg | ORAL_TABLET | ORAL | Status: DC | PRN

## 2012-06-04 MED ORDER — SERTRALINE HCL 50 MG PO TABS
50.0000 mg | ORAL_TABLET | Freq: Every day | ORAL | Status: DC
  Administered 2012-06-05: 50 mg via ORAL
  Filled 2012-06-04: qty 1

## 2012-06-04 MED ORDER — ONDANSETRON HCL 4 MG/2ML IJ SOLN: 4.0000 mg | Freq: Four times a day (QID) | INTRAMUSCULAR | Status: DC | PRN

## 2012-06-04 MED ORDER — CEPHALEXIN 500 MG PO CAPS
500.0000 mg | ORAL_CAPSULE | Freq: Four times a day (QID) | ORAL | Status: DC
  Administered 2012-06-04 – 2012-06-05 (×4): 500 mg via ORAL
  Filled 2012-06-04 (×3): qty 1
  Filled 2012-06-04: qty 2
  Filled 2012-06-04 (×3): qty 1

## 2012-06-04 NOTE — ED Provider Notes (Signed)
History     CSN: [id number]  Arrival date & time 06/04/12  1236   First MD Initiated Contact with Patient 06/04/12 1306      Chief Complaint  Patient presents with  . Weakness    Generalized    (Consider location/radiation/quality/duration/timing/severity/associated sxs/prior treatment) HPI Comments: Pt is 79 yo female who lives alone has had 2 days of dizziness and lightheadedness over the past 2 mornings one to 2 hours following taking her blood pressure medication.  Reports this morning she stood up and walk to the kitchen and became very lightheaded and had to "grab ahold of the cabinet" to stop her self from falling down.  She was able to return to her chair and denies falling.  Reports no chest pain, she does have some palpitations with the symptoms none currently.  History of PAF, Sick Sinus syndrome with pacemaker, hypertension.    Patient is a 79 y.o. female presenting with weakness.  Weakness This is a new problem. The current episode started in the past 7 days. The problem occurs intermittently (occured yesterday and today immediate after taking BP meds in AM). The problem has been gradually worsening. Associated symptoms include fatigue, a sore throat (clearing throat due to nasal drip but no pain), urinary symptoms (hx of recurrent cystitis but no sx today) and weakness. Pertinent negatives include no abdominal pain, anorexia, arthralgias, change in bowel habit, chest pain, chills, congestion, coughing, diaphoresis, fever, headaches, joint swelling, myalgias, nausea, neck pain, numbness, swollen glands, vertigo, visual change or vomiting. The symptoms are aggravated by standing.    Past Medical History  Diagnosis Date  . History of aortic insufficiency   . Anxiety   . Hypertension   . Pacemaker   . PAF (paroxysmal atrial fibrillation)   . SSS (sick sinus syndrome)   . Type II diabetes mellitus     "controlled w/diet"  . Skin cancer ?1960's    "between breasts"  .  Arthritis     "hands; between shoulders; back; feet"  . Panic attacks     "since losing husband 1994"  . Depression   . High risk medication use     on Flecainide  . Chronic anticoagulation   . Osteoporosis     Past Surgical History  Procedure Laterality Date  . Cesarean section  1955  . US echocardiography  07/25/2006    EF 55-60%  . US echocardiography  04/27/2004    EF 55-60%  . Cardiovascular stress test  12/09/2004  . Cataract extraction w/ intraocular lens  implant, bilateral  1990's  . Tonsillectomy      "school age"  . Appendectomy  1955  . Dilation and curettage of uterus  1953  . Breast cyst excision  ~ 1950    right  . Insert / replace / remove pacemaker  03/16/2006  . Skin cancer excision  ?1960's    "between my breasts"    Family History  Problem Relation Age of Onset  . Emphysema Father     History  Substance Use Topics  . Smoking status: Never Smoker   . Smokeless tobacco: Never Used  . Alcohol Use: No    OB History   Grav Para Term Preterm Abortions TAB SAB Ect Mult Living                  Review of Systems  Constitutional: Positive for fatigue. Negative for fever, chills and diaphoresis.  HENT: Positive for sore throat (clearing throat due to nasal drip  but no pain). Negative for congestion and neck pain.   Respiratory: Negative for cough.   Cardiovascular: Negative for chest pain.  Gastrointestinal: Negative for nausea, vomiting, abdominal pain, anorexia and change in bowel habit.  Genitourinary: Negative.  Negative for dysuria, frequency and decreased urine volume.  Musculoskeletal: Negative for myalgias, joint swelling and arthralgias.  Neurological: Positive for weakness. Negative for vertigo, numbness and headaches.    Allergies  Sulfonamide derivatives and Citalopram hydrobromide  Home Medications   Current Outpatient Rx  Name  Route  Sig  Dispense  Refill  . apixaban (ELIQUIS) 2.5 MG TABS tablet   Oral   Take 1 tablet (2.5 mg  total) by mouth 2 (two) times daily.   60 tablet   4   . Calcium Carbonate (CALCIUM 600 PO)   Oral   Take 600 mg by mouth daily.           . carvedilol (COREG) 6.25 MG tablet   Oral   Take 3.125 mg by mouth 2 (two) times daily with a meal.          . diltiazem (CARDIZEM CD) 180 MG 24 hr capsule   Oral   Take 1 capsule (180 mg total) by mouth daily.   30 capsule   12   . docusate sodium (COLACE) 100 MG capsule   Oral   Take 100 mg by mouth as needed.         Marland Kitchen estradiol (ESTRACE) 0.1 MG/GM vaginal cream   Vaginal   Place 2 g vaginally 3 (three) times a week. Monday, Wednesday, friday         . fish oil-omega-3 fatty acids 1000 MG capsule   Oral   Take 1 g by mouth daily.           . flecainide (TAMBOCOR) 50 MG tablet   Oral   Take 50 mg by mouth every 12 (twelve) hours.         Marland Kitchen LORazepam (ATIVAN) 0.5 MG tablet   Oral   Take 0.5 mg by mouth at bedtime as needed. For sleep         . rosuvastatin (CRESTOR) 5 MG tablet   Oral   Take 2.5 mg by mouth See admin instructions. Monday-Friday         . sertraline (ZOLOFT) 50 MG tablet   Oral   Take 50 mg by mouth every morning.          . Vitamin D, Ergocalciferol, (DRISDOL) 50000 UNITS CAPS   Oral   Take 50,000 Units by mouth every 7 (seven) days. wednesday         . zoledronic acid (RECLAST) 5 MG/100ML SOLN   Intravenous   Inject 5 mg into the vein once as needed.           BP 169/69  Pulse 77  Temp(Src) 98.1 F (36.7 C) (Oral)  Resp 18  SpO2 94%  Physical Exam  Nursing note and vitals reviewed. Constitutional: She appears well-developed and well-nourished. No distress.  Sits up easily in bed without assistance  HENT:  Head: Normocephalic and atraumatic.  Eyes: Conjunctivae are normal. Right eye exhibits no discharge. Left eye exhibits no discharge. No scleral icterus.  Neck: No JVD present. No tracheal deviation present.  Cardiovascular: Normal rate, regular rhythm, normal heart  sounds and intact distal pulses.  Exam reveals no gallop and no friction rub.   No murmur heard. Pulmonary/Chest: Effort normal and breath sounds normal. No respiratory distress.  She has no wheezes. She has no rales.  Abdominal: Soft. She exhibits no distension. There is no tenderness (no suprapubic tenderness).  Musculoskeletal: Normal range of motion. She exhibits no edema.  Neurological: She is alert. She exhibits normal muscle tone.  Skin: Skin is warm and dry. No rash noted. She is not diaphoretic. No erythema. No pallor.  Psychiatric: She has a normal mood and affect. Her behavior is normal. Judgment and thought content normal.    ED Course  Procedures (including critical care time)  Labs Reviewed  COMPREHENSIVE METABOLIC PANEL - Abnormal; Notable for the following:    Glucose, Bld 122 (*)    GFR calc non Af Amer 72 (*)    GFR calc Af Amer 84 (*)    All other components within normal limits  CBC WITH DIFFERENTIAL  TROPONIN I  URINALYSIS, ROUTINE W REFLEX MICROSCOPIC   Dg Chest 2 View  06/04/2012  *RADIOLOGY REPORT*  Clinical Data: Hypertension, weakness  CHEST - 2 VIEW  Comparison: 08/03/2011  Findings: The cardiac shadow is stable.  A pacing device is again seen.  Prominent hilar vascular shadows are again noted but stable. The lungs are clear bilaterally but hyperinflated.  No acute bony abnormality is seen.  IMPRESSION: No acute abnormality is noted.   Original Report Authenticated By: Alcide Clever, M.D.     Date: 06/04/2012  Rate: 75  Rhythm: normal sinus rhythm  QRS Axis: normal  Intervals: normal  ST/T Wave abnormalities: normal  Conduction Disutrbances: none  Narrative Interpretation: no ischemia  Old EKG Reviewed: No significant changes noted  No diagnosis found.  MDM  Pt with sx consistent with orthostatic hypotension.  Pt with pacemaker but symptomatic.  Will obtain OS vital signs.  No evidence of ischemia on EKG.  In sinus rhythm.  Pt on Eliquis, Coreg,  Cardizem, & Flecainide.  Obtain UA and basic lab work but no focal signs/sx of infection.  Will plan to consult [facility] for further evaluation.    1500 - Discussed with [place] to evaluate patient.  19 mmHg drop with standing and borderline bradycardia.  Ask for [place] to evaluate for admission vs medication adjustment with d/c home.  No Leukocytosis, UA still pending.  Troponin I negative X 1.  No renal insufficiency.  1416 - UA with Many Bacteria and Leukocytes; normal specific gravity.  No urinary symptoms at this time.  Pt reports chronic UTIs but given findings and symptoms will send urine culture & Treat with po keflex.    Cardiology to admit.  Keflex rx'd    Andrena Mews, DO 06/04/12 1759

## 2012-06-04 NOTE — ED Notes (Signed)
Pt states her legs feel weak, and she has been having some palpitations and lightheadedness and dizziness. Pt states b/p has been up and down, and she has been feeling like she is about to pass out.

## 2012-06-04 NOTE — H&P (Signed)
History and Physical  Patient ID: Jo [name] MRN: [id number], DOB: [birth year] Date of Encounter: 06/04/2012, 4:11 PM Primary Physician: Ezequiel Kayser, MD Primary Cardiologist: [place]  Chief Complaint: dizziness Reason for Admission: dizziness  HPI: Jo [name] is a 79 y/o F with history of PAF on flecainide and eliquis, SSS s/p pacemaker, and moderate AI who presented to [facility] with complaints of weakness, lightheadedness, palpitations and presyncope. 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 Two days ago she stood up to go fix her curtain and felt dizzy and lightheaded, as if she was going to pass out. She sat down with resolution of her symptoms, and felt back to normal in a few minutes. This morning the same thing happened - she stood up to walk to the kitchen, became very lightheaded, and had to grab the cabinet to stand steady until she could reach a chair. Symptoms again improved after sitting down. She also reports recent increase in palpitations ("heart pounding hard") that are not necessarily associated with the pre-syncopal episodes. She has not had any CP, SOB, nausea, vomiting, falls, fever or chills. She does report her BP have been very labile but cannot remember the values. Orthostatics after 500cc bolus in the ER demonstrated a drop in BP from 155/59 to 136/61 without any symptoms. CXR nonacute. BMET, CBC, troponin unremarkable. She appears to be in NSR at present. She denies any current complaints except feeling mildly weak. She lives alone and continues to drive, cook and clean for herself.  Orthostatic VS (after NS bolus) Lying  155/59  63  Sitting  147/51  67 Standing 136/61  63  Past Medical History  Diagnosis Date  . History of aortic insufficiency   . Anxiety   . Hypertension   . Pacemaker   . PAF (paroxysmal atrial fibrillation)     a. s/p pacemaker. b. s/p Medtronic gen change 07/2011.  . SSS (sick sinus syndrome)   . Type II  diabetes mellitus     "controlled w/diet"  . Skin cancer ?1960's    "between breasts"  . Arthritis     "hands; between shoulders; back; feet"  . Panic attacks     "since losing husband 1994"  . Depression   . High risk medication use     on Flecainide  . Chronic anticoagulation   . Osteoporosis      Most Recent Cardiac Studies: 2D Echo 06/2011 Study Conclusions - Left ventricle: The cavity size was normal. Wall thickness was increased in a pattern of mild LVH. Systolic function was normal. The estimated ejection fraction was in the range of 60% to 65%. Wall motion was normal; there were no regional wall motion abnormalities. Left ventricular diastolic function parameters were normal. - Aortic valve: Moderate regurgitation. - Mitral valve: Calcified annulus. Mildly thickened leaflets - Atrial septum: No defect or patent foramen ovale was identified. - Inferior vena cava: The vessel was normal in size; the respirophasic diameter changes were in the normal range (= 50%); findings are consistent with normal central venous pressure. - Pericardium, extracardiac: There was no pericardial effusion. Impressions: - The right ventricular systolic pressure was increased consistent with mild pulmonary hypertension.  Pacemaker Device Observation 07/2011 Wound check appointment. Steri-strips removed. Wound without redness or edema. Incision edges approximated, wound well healed. Normal device function. Thresholds, sensing, and impedances consistent with implant measurements. Device programmed at  3.5V/auto capture programmed on for extra safety margin until 3 month visit. Histogram distribution appropriate  for patient and level of activity. Pt in AF 2.1% of time. Longest episode 3 hours 28 minutes. No high ventricular rates noted. Blood pressure  elevated for past several days. Per Dr Johney Frame increase Carvedilol to 1 tablet twice daily. Pt aware and understands. Patient educated about wound care,  arm mobility, lifting restrictions. ROV in 3 months with implanting physician.   Surgical History:  Past Surgical History  Procedure Laterality Date  . Cesarean section  1955  . US echocardiography  07/25/2006    EF 55-60%  . US echocardiography  04/27/2004    EF 55-60%  . Cardiovascular stress test  12/09/2004  . Cataract extraction w/ intraocular lens  implant, bilateral  1990's  . Tonsillectomy      "school age"  . Appendectomy  1955  . Dilation and curettage of uterus  1953  . Breast cyst excision  ~ 1950    right  . Insert / replace / remove pacemaker  03/16/2006  . Skin cancer excision  ?1960's    "between my breasts"     Home Meds: Prior to Admission medications   Medication Sig Start Date End Date Taking? Authorizing Provider  apixaban (ELIQUIS) 2.5 MG TABS tablet Take 1 tablet (2.5 mg total) by mouth 2 (two) times daily. 03/07/12  Yes Hillis Range, MD  Calcium Carbonate (CALCIUM 600 PO) Take 600 mg by mouth daily.     Yes Historical Provider, MD  carvedilol (COREG) 6.25 MG tablet Take 3.125 mg by mouth 2 (two) times daily with a meal.    Yes Historical Provider, MD  diltiazem (CARDIZEM CD) 180 MG 24 hr capsule Take 1 capsule (180 mg total) by mouth daily. 02/21/12 02/20/13 Yes Peter M [place], MD  estradiol (ESTRACE) 0.1 MG/GM vaginal cream Place 2 g vaginally 3 (three) times a week. Monday, Wednesday, friday   Yes Historical Provider, MD  flecainide (TAMBOCOR) 50 MG tablet Take 50 mg by mouth every 12 (twelve) hours. 07/23/11 07/22/12 Yes Jodelle Gross, NP  rosuvastatin (CRESTOR) 5 MG tablet Take 2.5 mg by mouth See admin instructions. Monday-Friday   Yes Historical Provider, MD  sertraline (ZOLOFT) 50 MG tablet Take 50 mg by mouth every morning.    Yes Historical Provider, MD  docusate sodium (COLACE) 100 MG capsule Take 100 mg by mouth as needed.    Historical Provider, MD  LORazepam (ATIVAN) 0.5 MG tablet Take 0.5 mg by mouth 2 (two) times daily as needed. For sleep     Historical Provider, MD  Vitamin D, Ergocalciferol, (DRISDOL) 50000 UNITS CAPS Take 50,000 Units by mouth every Wednesday.     Historical Provider, MD    Allergies:  Allergies  Allergen Reactions  . Sulfonamide Derivatives Rash    "it was all over my legs"  . Citalopram Hydrobromide     Pt does not remember reaction    History   Social History  . Marital Status: Widowed    Spouse Name: N/A    Number of Children: 2  . Years of Education: N/A   Occupational History  . Not on file.   Social History Main Topics  . Smoking status: Never Smoker   . Smokeless tobacco: Never Used  . Alcohol Use: No  . Drug Use: No  . Sexually Active: No   Other Topics Concern  . Not on file   Social History Narrative  . No narrative on file     Family History  Problem Relation Age of Onset  . Emphysema Father  Review of Systems: General: negative for chills, fever, night sweats or weight changes.  Cardiovascular: negative for chest pain, edema, orthopnea, paroxysmal nocturnal dyspnea, shortness of breath or dyspnea on exertion Dermatological: negative for rash Respiratory: negative for cough or wheezing Urologic: negative for hematuria currently. 1 month ago reports hematuria associated with UTI but none sense. Denies any urinary sx. Abdominal: negative for nausea, vomiting, diarrhea, bright red blood per rectum, melena, or hematemesis Neurologic: negative for visual changes. See above All other systems reviewed and are otherwise negative except as noted above.  Labs:   Lab Results  Component Value Date   WBC 8.6 06/04/2012   HGB 13.2 06/04/2012   HCT 38.6 06/04/2012   MCV 92.1 06/04/2012   PLT 192 06/04/2012    Recent Labs Lab 06/04/12 1244  NA 140  K 3.9  CL 106  CO2 28  BUN 22  CREATININE 0.72  CALCIUM 10.1  PROT 6.7  BILITOT 0.4  ALKPHOS 58  ALT 10  AST 11  GLUCOSE 122*    Recent Labs  06/04/12 1244  TROPONINI <0.30    Radiology/Studies:  Dg Chest 2  View 06/04/2012  *RADIOLOGY REPORT*  Clinical Data: Hypertension, weakness  CHEST - 2 VIEW  Comparison: 08/03/2011  Findings: The cardiac shadow is stable.  A pacing device is again seen.  Prominent hilar vascular shadows are again noted but stable. The lungs are clear bilaterally but hyperinflated.  No acute bony abnormality is seen.  IMPRESSION: No acute abnormality is noted.   Original Report Authenticated By: Alcide Clever, M.D.      EKG: NSR 75bpm no acute ST-T changes  Physical Exam: Blood pressure 141/66, pulse 60, temperature 98.1 F (36.7 C), temperature source Oral, resp. rate 18, SpO2 92.00%. General: Well developed, well nourished elderly WF in no acute distress, well appearing Head: Normocephalic, atraumatic, sclera non-icteric, no xanthomas, nares are without discharge.  Neck: Negative for carotid bruits. JVD not elevated. Lungs: Clear bilaterally to auscultation without wheezes, rales, or rhonchi. Breathing is unlabored. Heart: RRR with S1 S2. No murmurs, rubs, or gallops appreciated. Abdomen: Soft, non-tender, non-distended with normoactive bowel sounds. No hepatomegaly. No rebound/guarding. No obvious abdominal masses. Msk:  Strength and tone appear normal for age. Extremities: No clubbing or cyanosis. No edema.  Distal pedal pulses are 2+ and equal bilaterally. Neuro: Alert and oriented X 3. No focal deficit. No facial asymmetry. Moves all extremities spontaneously. Very hard of hearing Psych:  Responds to questions appropriately with a normal affect.    ASSESSMENT AND PLAN:   1. Presyncope consistent with orthostatic hypotension - given that she needs rate-controlling medicines for her PAF, will add florinef 0.1mg  daily, first dose today. 2. PAF/SSS with recent increase in palpitations, independent of #1 - interrogation of pacemaker in ER shows no mode switches (detection rate 145bpm) and no high V-rate episodes >180, thus we can imply she has had no very fast atrial fib or VT.  Histograms do show minimal HR >100-120 thus she could be having arrhythmia below her detection zone. Her pacer has been reprogrammed for data collection to <150bpm for high v-rate episodes. We have left atrial detection rate alone for now. Will ask that EP round on her tomorrow to make final recommendations. Could consider outpatient event monitoring to pick up arrhythmia below detection zone. Continue other home meds. 3. Moderate AI by echo 06/2011 - follow. 4. HTN with orthostasis as above - watch BP with addition of Florinef.  Signed, Ronie Spies PA-C 06/04/2012, 4:11  PM Patient seen and examined. I agree with the assessment and plan as detailed above. See also my additional thoughts below.  I have reviewed all the information with Ronie Spies, PA-C. The patient appears to be having orthostatic symptoms. She is not on a diuretic. She has never had evidence of volume overload. At the same time she is having palpitations. From her pacemaker interrogation, we cannot find any definite abnormalities. The patient has had 2 spells of feeling fairly dizzy when standing up to walk in her home. She is elderly and anticoagulated. It is important for [place] to resolve this issue. She says that she drinks a lot of fluid as she has been advised to do this in the past. I decided to keep her in the hospital tonight to monitor her. In addition we will add a small dose of Florinef. She can then be followed very carefully and she'll have an outpatient event recorder when she goes home.   Willa Rough, MD, [facility] 06/04/2012 5:48 PM

## 2012-06-04 NOTE — ED Notes (Signed)
Pt c/o generalized weakness for 3-4 days. Pt states the last 2 days she has been feeling dizzy and like she is going to pass out. Pt denies any cp, or sob. Pt has a pacemaker. She does state that she does not feel well around her heart. Vitals 162/82, HR 90's, 96% RA, 12 lead unremarkable. Pt alert and oriented and comes from home.

## 2012-06-05 DIAGNOSIS — I495 Sick sinus syndrome: Secondary | ICD-10-CM | POA: Diagnosis not present

## 2012-06-05 DIAGNOSIS — I951 Orthostatic hypotension: Secondary | ICD-10-CM | POA: Diagnosis not present

## 2012-06-05 DIAGNOSIS — I4891 Unspecified atrial fibrillation: Secondary | ICD-10-CM | POA: Diagnosis not present

## 2012-06-05 LAB — PACEMAKER DEVICE OBSERVATION
AL AMPLITUDE: 4 mv
AL THRESHOLD: 0.5 V
RV LEAD AMPLITUDE: 8 mv
RV LEAD IMPEDENCE PM: 398 Ohm
RV LEAD THRESHOLD: 0.75 V

## 2012-06-05 LAB — CBC
MCV: 92.5 fL (ref 78.0–100.0)
Platelets: 193 10*3/uL (ref 150–400)
RBC: 3.98 MIL/uL (ref 3.87–5.11)
RDW: 12.9 % (ref 11.5–15.5)
WBC: 8.9 10*3/uL (ref 4.0–10.5)

## 2012-06-05 LAB — BASIC METABOLIC PANEL
CO2: 27 mEq/L (ref 19–32)
Chloride: 105 mEq/L (ref 96–112)
GFR calc Af Amer: 66 mL/min — ABNORMAL LOW (ref >90)
Potassium: 4 mEq/L (ref 3.5–5.1)
Sodium: 141 mEq/L (ref 135–145)

## 2012-06-05 MED ORDER — FLUDROCORTISONE ACETATE 0.1 MG PO TABS: 0.1000 mg | ORAL_TABLET | Freq: Every day | ORAL | Status: DC

## 2012-06-05 NOTE — Progress Notes (Signed)
Utilization review completed.  

## 2012-06-05 NOTE — Progress Notes (Signed)
DC instructions given to patient at this time re: f/u appts, diet, activity, s/s of problems to report to md, Clinical cytogeneticist, and community resources.  Pt verbalized understanding of all instructions.  DCd iv.  DCd tele.

## 2012-06-05 NOTE — Plan of Care (Signed)
Problem: Phase I Progression Outcomes Goal: First antibiotic given within 6hrs of admit Xray normal  Problem: Discharge Progression Outcomes Goal: Vaccine documented on D/C instructions Outcome: Not Applicable Date Met:  06/05/12 Already had vaccine

## 2012-06-05 NOTE — Discharge Summary (Signed)
ELECTROPHYSIOLOGY DISCHARGE SUMMARY    Patient ID: Jo [name],  MRN: [id number], DOB/AGE: [birth year] 77 y.o.  Admit date: 06/04/2012 Discharge date: 06/05/2012  Primary Care Physician: Rodrigo Ran, MD Primary Cardiologist: Peter [place], MD  Primary Discharge Diagnosis:  1. Orthostatic presyncope/dizziness  Encouraged PO hydration  Florinef started by Dr Myrtis Ser  Would be very cautious with Florinef long term. Consider stopping this medicine eventually.  No driving until she follows up with Dr [place].  2. Bradycardia s/p PPM - normal PPM function by interrogation yesterday (performed by industry) 3. PAF - maintaining SR  Secondary Discharge Diagnoses:  1. Moderate AI 2. HTN 3. DM 4. Anxiety/depression 5. Osteoporosis  Procedures This Admission:  None   History and Hospital Course:  Jo [name] is a 79 year old woman with bradycardia s/p PPM implant, PAF and moderate AI who presented to [facility] ED yesterday with dizziness. Her symptoms were occurring with postural/positional changes. She also reported "pounding" palpitations but denied CP, SOB or syncope. Orthostatics after 500cc NS bolus in the ED demonstrated a drop in BP from 155/59 to 136/61 without any symptoms. CXR nonacute. BMET, CBC, troponin unremarkable. She was in NSR. She was observed overnight. Her PPM was interrogated by industry and found to be functioning normally. There were no episodes on device interrogation. Her symptoms were felt to be due to orthostatic hypotension and Florinef was added to her medication regimen. She remains hemodynamically stable and in SR. She has been seen, examined and deemed stable for discharge today by Dr Hillis Range. She was instructed not to drive until given clearance by Dr. [place]. She will follow-up with Dr. [place] in 3-4 weeks.  Discharge Vitals: Blood pressure 120/54, pulse 66, temperature 97.7 F (36.5 C), temperature source Oral, resp. rate 18, height 5\' 4"  (1.626 m), weight 127 lb 3.2  oz (57.698 kg), SpO2 94.00%.   Labs: Lab Results  Component Value Date   WBC 8.9 06/05/2012   HGB 12.7 06/05/2012   HCT 36.8 06/05/2012   MCV 92.5 06/05/2012   PLT 193 06/05/2012     Recent Labs Lab 06/04/12 1244 06/05/12 0425  NA 140 141  K 3.9 4.0  CL 106 105  CO2 28 27  BUN 22 21  CREATININE 0.72 0.86  CALCIUM 10.1 9.3  PROT 6.7  --   BILITOT 0.4  --   ALKPHOS 58  --   ALT 10  --   AST 11  --   GLUCOSE 122* 128*   Lab Results  Component Value Date   TROPONINI <0.30 06/04/2012     Disposition:  The patient is being discharged in stable condition.  Follow-up:     Follow-up Information   Follow up with Peter [place], MD On 07/06/2012. (At 1:45 PM)    Contact information:   [address] [place] [address] [phone]      Follow up with Hillis Range, MD On 08/23/2012. (At 9:00 AM)    Contact information:   [address] [place] [address] [phone]     Discharge Medications:    Medication List    TAKE these medications       apixaban 2.5 MG Tabs tablet  Commonly known as:  ELIQUIS  Take 1 tablet (2.5 mg total) by mouth 2 (two) times daily.     CALCIUM 600 PO  Take 600 mg by mouth daily.     carvedilol 6.25 MG tablet  Commonly known as:  COREG  Take 3.125 mg by mouth 2 (two) times daily with a meal.     diltiazem 180 MG 24 hr capsule  Commonly known as:  CARDIZEM CD  Take 1 capsule (180 mg total) by mouth daily.     docusate sodium 100 MG capsule  Commonly known as:  COLACE  Take 100 mg by mouth as needed.     estradiol 0.1 MG/GM vaginal cream  Commonly known as:  ESTRACE  Place 2 g vaginally 3 (three) times a week. Monday, Wednesday, friday     flecainide 50 MG tablet  Commonly known as:  TAMBOCOR  Take 50 mg by mouth every 12 (twelve) hours.     fludrocortisone 0.1 MG tablet  Commonly known as:  FLORINEF  Take 1 tablet (0.1 mg total) by mouth daily.     LORazepam 0.5 MG tablet  Commonly  known as:  ATIVAN  Take 0.5 mg by mouth 2 (two) times daily as needed. For sleep     rosuvastatin 5 MG tablet  Commonly known as:  CRESTOR  Take 2.5 mg by mouth See admin instructions. Monday-Friday     sertraline 50 MG tablet  Commonly known as:  ZOLOFT  Take 50 mg by mouth every morning.     Vitamin D (Ergocalciferol) 50000 UNITS Caps  Commonly known as:  DRISDOL  Take 50,000 Units by mouth every Wednesday.       Duration of Discharge Encounter: Greater than 30 minutes including physician time.  Signed, Rick Duff, PA-C 06/05/2012, 1:25 PM    Hillis Range, MD

## 2012-06-05 NOTE — Progress Notes (Signed)
SUBJECTIVE: The patient is doing well today.  At this time, she denies chest pain, shortness of breath, or any new concerns.  She has not had any further dizziness.  Florinef was started yesterday for orthostatic hypotension.   Marland Kitchen apixaban  2.5 mg Oral BID  . atorvastatin  10 mg Oral Custom  . calcium carbonate  1 tablet Oral Q breakfast  . carvedilol  3.125 mg Oral BID WC  . cephALEXin  500 mg Oral Q6H  . diltiazem  180 mg Oral Daily  . [START ON 06/06/2012] estradiol  2 g Vaginal 3 times weekly  . flecainide  50 mg Oral Q12H  . fludrocortisone  0.1 mg Oral Daily  . sertraline  50 mg Oral Daily  . sodium chloride  3 mL Intravenous Q12H  . [START ON 06/06/2012] Vitamin D (Ergocalciferol)  50,000 Units Oral Q Wed      OBJECTIVE: Physical Exam: Filed Vitals:   06/04/12 1745 06/04/12 1830 06/04/12 2041 06/05/12 0618  BP: 153/52 176/67 175/62 120/54  Pulse: 59 60 80 66  Temp:  97.9 F (36.6 C) 97.7 F (36.5 C) 97.7 F (36.5 C)  TempSrc:      Resp: 19 17 18 18   Height:  5\' 4"  (1.626 m)    Weight:  127 lb 3.2 oz (57.698 kg)    SpO2: 93% 94% 95% 94%   No intake or output data in the 24 hours ending 06/05/12 0745  Telemetry reveals sinus rhythm with intermittent atrial pacing  GEN- The patient is well appearing, alert and oriented x 3 today.   Head- normocephalic, atraumatic Eyes-  Sclera clear, conjunctiva pink Ears- hearing intact Oropharynx- clear Neck- supple, no JVP Lymph- no cervical lymphadenopathy Lungs- Clear to ausculation bilaterally, normal work of breathing Heart- Regular rate and rhythm, no murmurs, rubs or gallops, PMI not laterally displaced GI- soft, NT, ND, + BS Extremities- no clubbing, cyanosis, or edema Neuro- strength and sensation are intact  LABS: Basic Metabolic Panel:  Recent Labs  29/56/21 1244 06/05/12 0425  NA 140 141  K 3.9 4.0  CL 106 105  CO2 28 27  GLUCOSE 122* 128*  BUN 22 21  CREATININE 0.72 0.86  CALCIUM 10.1 9.3   Liver  Function Tests:  Recent Labs  06/04/12 1244  AST 11  ALT 10  ALKPHOS 58  BILITOT 0.4  PROT 6.7  ALBUMIN 3.7   CBC:  Recent Labs  06/04/12 1244 06/05/12 0425  WBC 8.6 8.9  NEUTROABS 5.6  --   HGB 13.2 12.7  HCT 38.6 36.8  MCV 92.1 92.5  PLT 192 193   Cardiac Enzymes:  Recent Labs  06/04/12 1244  TROPONINI <0.30   RADIOLOGY: Dg Chest 2 View 06/04/2012  *RADIOLOGY REPORT*  Clinical Data: Hypertension, weakness  CHEST - 2 VIEW  Comparison: 08/03/2011  Findings: The cardiac shadow is stable.  A pacing device is again seen.  Prominent hilar vascular shadows are again noted but stable. The lungs are clear bilaterally but hyperinflated.  No acute bony abnormality is seen.  IMPRESSION: No acute abnormality is noted.   Original Report Authenticated By: Alcide Clever, M.D.     ASSESSMENT AND PLAN:   1. Orthostatic presyncope/ dizziness Encourage PO hydration Florinef started by Dr Myrtis Ser Would be very cautious with florinef long term.  Consider stopping this medicine eventually. No driving until she follows up with Dr [place].  2. Bradycardia Per report, normal pacemaker function.  I do not have her full interrogation  available to review  3. afib Maintaining sinus  Ambulate in halls this am. If stable, then OK to discharge later today.   Hillis Range, MD 06/05/2012 7:45 AM

## 2012-06-05 NOTE — Progress Notes (Signed)
Pacer check

## 2012-06-05 NOTE — Progress Notes (Signed)
Inpatient Diabetes Program Recommendations  AACE/ADA: New Consensus Statement on Inpatient Glycemic Control (2013)  Target Ranges:  Prepandial:   less than 140 mg/dL      Peak postprandial:   less than 180 mg/dL (1-2 hours)      Critically ill patients:  140 - 180 mg/dL   Results for Jo [name], Jo [name] (MRN [id number]) as of 06/05/2012 10:28  Ref. Range 06/04/2012 21:36  Glucose-Capillary Latest Range: 70-99 mg/dL 409 (H)  Results for Jo [name], Jo [name] (MRN [id number]) as of 06/05/2012 10:28  Ref. Range 06/05/2012 04:25  Glucose Latest Range: 70-99 mg/dL 956 (H)    Inpatient Diabetes Program Recommendations Correction (SSI): Please consider ordering CBGs with Novolog correction scale if needed. HgbA1C: Prentiss want to order an A1C to determine glycemic control over the past 2-3 months.  Note: Patient has a history of diabetes which is diet controlled as an outpatient.  Currently there are no orders for inpatient glycemic control.  Noted from progress note for today that patient Casas be discharged later today.  While inpatient, please consider ordering CBGs with Novolog sensitive correction if needed ACHS.  Will continue to follow.  Thanks, Orlando Penner, RN, BSN, CCRN Diabetes Coordinator Inpatient Diabetes Program [phone]'

## 2012-06-06 NOTE — ED Provider Notes (Signed)
I have supervised the resident on the management of this patient and agree with the note above. I personally interviewed and examined the patient and my addendum is below.   Jo [name] is a 79 y.o. female hx of HTN, aortic insufficiency here with weakness and lightheadedness. Felt lightheaded and almost passed out in the kitchen today. EKG unremarkable. Patient orthostatic. Labs unremarkable. Given her age and cardiac risk factors, cardiology consulted and admitted the patient.    Richardean Canal, MD 06/06/12 [phone]

## 2012-06-07 LAB — URINE CULTURE

## 2012-06-13 ENCOUNTER — Other Ambulatory Visit: Payer: Self-pay | Admitting: Adult Health

## 2012-06-13 DIAGNOSIS — N952 Postmenopausal atrophic vaginitis: Secondary | ICD-10-CM | POA: Diagnosis not present

## 2012-06-13 DIAGNOSIS — N3000 Acute cystitis without hematuria: Secondary | ICD-10-CM | POA: Diagnosis not present

## 2012-06-21 DIAGNOSIS — IMO0002 Reserved for concepts with insufficient information to code with codable children: Secondary | ICD-10-CM | POA: Diagnosis not present

## 2012-06-21 DIAGNOSIS — M810 Age-related osteoporosis without current pathological fracture: Secondary | ICD-10-CM | POA: Diagnosis not present

## 2012-06-21 DIAGNOSIS — E079 Disorder of thyroid, unspecified: Secondary | ICD-10-CM | POA: Diagnosis not present

## 2012-06-21 DIAGNOSIS — I10 Essential (primary) hypertension: Secondary | ICD-10-CM | POA: Diagnosis not present

## 2012-06-21 DIAGNOSIS — I4891 Unspecified atrial fibrillation: Secondary | ICD-10-CM | POA: Diagnosis not present

## 2012-06-21 DIAGNOSIS — Z1331 Encounter for screening for depression: Secondary | ICD-10-CM | POA: Diagnosis not present

## 2012-06-21 DIAGNOSIS — E119 Type 2 diabetes mellitus without complications: Secondary | ICD-10-CM | POA: Diagnosis not present

## 2012-07-06 ENCOUNTER — Ambulatory Visit: Payer: Medicare Other | Admitting: Cardiology

## 2012-07-06 ENCOUNTER — Encounter (HOSPITAL_COMMUNITY): Payer: Self-pay | Admitting: *Deleted

## 2012-07-06 ENCOUNTER — Inpatient Hospital Stay (HOSPITAL_COMMUNITY): Payer: Medicare Other

## 2012-07-06 ENCOUNTER — Inpatient Hospital Stay (HOSPITAL_COMMUNITY)
Admission: EM | Admit: 2012-07-06 | Discharge: 2012-07-11 | DRG: 871 | Disposition: A | Discharge status: Skilled Nursing Facility | Payer: Medicare Other | Attending: Internal Medicine | Admitting: Internal Medicine

## 2012-07-06 ENCOUNTER — Emergency Department (HOSPITAL_COMMUNITY): Payer: Medicare Other

## 2012-07-06 DIAGNOSIS — A4159 Other Gram-negative sepsis: Secondary | ICD-10-CM | POA: Diagnosis not present

## 2012-07-06 DIAGNOSIS — N12 Tubulo-interstitial nephritis, not specified as acute or chronic: Secondary | ICD-10-CM | POA: Diagnosis present

## 2012-07-06 DIAGNOSIS — I4892 Unspecified atrial flutter: Secondary | ICD-10-CM | POA: Diagnosis present

## 2012-07-06 DIAGNOSIS — I5033 Acute on chronic diastolic (congestive) heart failure: Secondary | ICD-10-CM

## 2012-07-06 DIAGNOSIS — K8010 Calculus of gallbladder with chronic cholecystitis without obstruction: Secondary | ICD-10-CM | POA: Diagnosis not present

## 2012-07-06 DIAGNOSIS — R143 Flatulence: Secondary | ICD-10-CM | POA: Diagnosis not present

## 2012-07-06 DIAGNOSIS — I509 Heart failure, unspecified: Secondary | ICD-10-CM | POA: Diagnosis present

## 2012-07-06 DIAGNOSIS — D696 Thrombocytopenia, unspecified: Secondary | ICD-10-CM | POA: Diagnosis present

## 2012-07-06 DIAGNOSIS — R142 Eructation: Secondary | ICD-10-CM | POA: Diagnosis not present

## 2012-07-06 DIAGNOSIS — R748 Abnormal levels of other serum enzymes: Secondary | ICD-10-CM | POA: Diagnosis present

## 2012-07-06 DIAGNOSIS — E876 Hypokalemia: Secondary | ICD-10-CM | POA: Diagnosis present

## 2012-07-06 DIAGNOSIS — F410 Panic disorder [episodic paroxysmal anxiety] without agoraphobia: Secondary | ICD-10-CM

## 2012-07-06 DIAGNOSIS — N201 Calculus of ureter: Secondary | ICD-10-CM | POA: Diagnosis present

## 2012-07-06 DIAGNOSIS — Z85828 Personal history of other malignant neoplasm of skin: Secondary | ICD-10-CM | POA: Diagnosis not present

## 2012-07-06 DIAGNOSIS — R262 Difficulty in walking, not elsewhere classified: Secondary | ICD-10-CM | POA: Diagnosis not present

## 2012-07-06 DIAGNOSIS — J690 Pneumonitis due to inhalation of food and vomit: Secondary | ICD-10-CM | POA: Diagnosis not present

## 2012-07-06 DIAGNOSIS — N200 Calculus of kidney: Secondary | ICD-10-CM | POA: Diagnosis not present

## 2012-07-06 DIAGNOSIS — R6520 Severe sepsis without septic shock: Secondary | ICD-10-CM | POA: Diagnosis present

## 2012-07-06 DIAGNOSIS — J189 Pneumonia, unspecified organism: Secondary | ICD-10-CM | POA: Diagnosis present

## 2012-07-06 DIAGNOSIS — A498 Other bacterial infections of unspecified site: Secondary | ICD-10-CM | POA: Diagnosis present

## 2012-07-06 DIAGNOSIS — J988 Other specified respiratory disorders: Secondary | ICD-10-CM | POA: Diagnosis not present

## 2012-07-06 DIAGNOSIS — R0602 Shortness of breath: Secondary | ICD-10-CM | POA: Diagnosis not present

## 2012-07-06 DIAGNOSIS — F419 Anxiety disorder, unspecified: Secondary | ICD-10-CM

## 2012-07-06 DIAGNOSIS — A419 Sepsis, unspecified organism: Secondary | ICD-10-CM | POA: Diagnosis present

## 2012-07-06 DIAGNOSIS — E119 Type 2 diabetes mellitus without complications: Secondary | ICD-10-CM

## 2012-07-06 DIAGNOSIS — M129 Arthropathy, unspecified: Secondary | ICD-10-CM | POA: Diagnosis present

## 2012-07-06 DIAGNOSIS — R6889 Other general symptoms and signs: Secondary | ICD-10-CM | POA: Diagnosis not present

## 2012-07-06 DIAGNOSIS — F341 Dysthymic disorder: Secondary | ICD-10-CM | POA: Diagnosis present

## 2012-07-06 DIAGNOSIS — K5900 Constipation, unspecified: Secondary | ICD-10-CM | POA: Diagnosis present

## 2012-07-06 DIAGNOSIS — R5381 Other malaise: Secondary | ICD-10-CM | POA: Diagnosis present

## 2012-07-06 DIAGNOSIS — R0989 Other specified symptoms and signs involving the circulatory and respiratory systems: Secondary | ICD-10-CM | POA: Diagnosis not present

## 2012-07-06 DIAGNOSIS — J9819 Other pulmonary collapse: Secondary | ICD-10-CM | POA: Diagnosis not present

## 2012-07-06 DIAGNOSIS — I359 Nonrheumatic aortic valve disorder, unspecified: Secondary | ICD-10-CM | POA: Diagnosis present

## 2012-07-06 DIAGNOSIS — F411 Generalized anxiety disorder: Secondary | ICD-10-CM | POA: Diagnosis not present

## 2012-07-06 DIAGNOSIS — N390 Urinary tract infection, site not specified: Secondary | ICD-10-CM | POA: Diagnosis not present

## 2012-07-06 DIAGNOSIS — M6281 Muscle weakness (generalized): Secondary | ICD-10-CM | POA: Diagnosis not present

## 2012-07-06 DIAGNOSIS — I10 Essential (primary) hypertension: Secondary | ICD-10-CM | POA: Diagnosis present

## 2012-07-06 DIAGNOSIS — Z950 Presence of cardiac pacemaker: Secondary | ICD-10-CM | POA: Diagnosis not present

## 2012-07-06 DIAGNOSIS — Z79899 Other long term (current) drug therapy: Secondary | ICD-10-CM | POA: Diagnosis not present

## 2012-07-06 DIAGNOSIS — I959 Hypotension, unspecified: Secondary | ICD-10-CM | POA: Diagnosis not present

## 2012-07-06 DIAGNOSIS — J9600 Acute respiratory failure, unspecified whether with hypoxia or hypercapnia: Secondary | ICD-10-CM | POA: Diagnosis present

## 2012-07-06 DIAGNOSIS — R1032 Left lower quadrant pain: Secondary | ICD-10-CM | POA: Diagnosis not present

## 2012-07-06 DIAGNOSIS — E559 Vitamin D deficiency, unspecified: Secondary | ICD-10-CM | POA: Diagnosis present

## 2012-07-06 DIAGNOSIS — N1330 Unspecified hydronephrosis: Secondary | ICD-10-CM | POA: Diagnosis present

## 2012-07-06 DIAGNOSIS — I495 Sick sinus syndrome: Secondary | ICD-10-CM

## 2012-07-06 DIAGNOSIS — M810 Age-related osteoporosis without current pathological fracture: Secondary | ICD-10-CM | POA: Diagnosis present

## 2012-07-06 DIAGNOSIS — I4891 Unspecified atrial fibrillation: Secondary | ICD-10-CM | POA: Diagnosis not present

## 2012-07-06 DIAGNOSIS — A4150 Gram-negative sepsis, unspecified: Secondary | ICD-10-CM

## 2012-07-06 DIAGNOSIS — Z8679 Personal history of other diseases of the circulatory system: Secondary | ICD-10-CM

## 2012-07-06 DIAGNOSIS — I480 Paroxysmal atrial fibrillation: Secondary | ICD-10-CM

## 2012-07-06 DIAGNOSIS — R141 Gas pain: Secondary | ICD-10-CM | POA: Diagnosis not present

## 2012-07-06 DIAGNOSIS — R110 Nausea: Secondary | ICD-10-CM | POA: Diagnosis not present

## 2012-07-06 LAB — TROPONIN I: Troponin I: <0.3 ng/mL (ref <0.30)

## 2012-07-06 LAB — COMPREHENSIVE METABOLIC PANEL
ALT: 52 U/L — ABNORMAL HIGH (ref 0–35)
BUN: 21 mg/dL (ref 6–23)
Calcium: 9.2 mg/dL (ref 8.4–10.5)
GFR calc Af Amer: 65 mL/min — ABNORMAL LOW (ref >90)
Glucose, Bld: 237 mg/dL — ABNORMAL HIGH (ref 70–99)
Sodium: 135 mEq/L (ref 135–145)
Total Protein: 6.9 g/dL (ref 6.0–8.3)

## 2012-07-06 LAB — CBC WITH DIFFERENTIAL/PLATELET
Basophils Absolute: 0 10*3/uL (ref 0.0–0.1)
Basophils Relative: 0 % (ref 0–1)
Eosinophils Absolute: 0 10*3/uL (ref 0.0–0.7)
Eosinophils Relative: 0 % (ref 0–5)
HCT: 35.1 % — ABNORMAL LOW (ref 36.0–46.0)
Hemoglobin: 12.4 g/dL (ref 12.0–15.0)
Lymphocytes Relative: 5 % — ABNORMAL LOW (ref 12–46)
Lymphs Abs: 0.6 10*3/uL — ABNORMAL LOW (ref 0.7–4.0)
MCH: 32 pg (ref 26.0–34.0)
MCHC: 35.3 g/dL (ref 30.0–36.0)
MCV: 90.5 fL (ref 78.0–100.0)
Monocytes Absolute: 0.4 10*3/uL (ref 0.1–1.0)
Monocytes Relative: 3 % (ref 3–12)
Neutro Abs: 12.1 10*3/uL — ABNORMAL HIGH (ref 1.7–7.7)
Neutrophils Relative %: 92 % — ABNORMAL HIGH (ref 43–77)
Platelets: 103 10*3/uL — ABNORMAL LOW (ref 150–400)
RBC: 3.88 MIL/uL (ref 3.87–5.11)
RDW: 13.5 % (ref 11.5–15.5)
WBC: 13.1 10*3/uL — ABNORMAL HIGH (ref 4.0–10.5)

## 2012-07-06 LAB — URINALYSIS, ROUTINE W REFLEX MICROSCOPIC
Leukocytes, UA: NEGATIVE
Nitrite: POSITIVE — AB
Protein, ur: 30 mg/dL — AB
Urobilinogen, UA: 1 mg/dL (ref 0.0–1.0)

## 2012-07-06 LAB — GLUCOSE, CAPILLARY: Glucose-Capillary: 310 mg/dL — ABNORMAL HIGH (ref 70–99)

## 2012-07-06 LAB — BASIC METABOLIC PANEL
CO2: 27 mEq/L (ref 19–32)
Chloride: 95 mEq/L — ABNORMAL LOW (ref 96–112)
GFR calc Af Amer: 84 mL/min — ABNORMAL LOW (ref >90)
Potassium: 2.9 mEq/L — ABNORMAL LOW (ref 3.5–5.1)

## 2012-07-06 LAB — LIPASE, BLOOD: Lipase: 31 U/L (ref 11–59)

## 2012-07-06 LAB — MRSA PCR SCREENING: MRSA by PCR: NEGATIVE

## 2012-07-06 LAB — URINE MICROSCOPIC-ADD ON

## 2012-07-06 LAB — POCT I-STAT 3, ART BLOOD GAS (G3+)
O2 Saturation: 90 %
pCO2 arterial: 42.5 mmHg (ref 35.0–45.0)
pO2, Arterial: 60 mmHg — ABNORMAL LOW (ref 80.0–100.0)

## 2012-07-06 LAB — PROTIME-INR: Prothrombin Time: 16.9 seconds — ABNORMAL HIGH (ref 11.6–15.2)

## 2012-07-06 MED ORDER — PIPERACILLIN-TAZOBACTAM 3.375 G IVPB: 3.3750 g | Freq: Four times a day (QID) | INTRAVENOUS | Status: DC

## 2012-07-06 MED ORDER — FUROSEMIDE 10 MG/ML IJ SOLN
40.0000 mg | Freq: Once | INTRAMUSCULAR | Status: AC
  Administered 2012-07-06: 40 mg via INTRAVENOUS
  Filled 2012-07-06: qty 4

## 2012-07-06 MED ORDER — ASPIRIN 300 MG RE SUPP: 300.0000 mg | RECTAL | Status: AC

## 2012-07-06 MED ORDER — IOHEXOL 350 MG/ML SOLN
100.0000 mL | Freq: Once | INTRAVENOUS | Status: AC | PRN
  Administered 2012-07-06: 100 mL via INTRAVENOUS

## 2012-07-06 MED ORDER — FLECAINIDE ACETATE 50 MG PO TABS
50.0000 mg | ORAL_TABLET | Freq: Two times a day (BID) | ORAL | Status: DC
  Administered 2012-07-06 – 2012-07-09 (×6): 50 mg via ORAL
  Filled 2012-07-06 (×7): qty 1

## 2012-07-06 MED ORDER — DILTIAZEM HCL 100 MG IV SOLR
5.0000 mg/h | INTRAVENOUS | Status: DC
  Administered 2012-07-06: 5 mg/h via INTRAVENOUS
  Filled 2012-07-06: qty 100

## 2012-07-06 MED ORDER — ONDANSETRON HCL 4 MG/2ML IJ SOLN
4.0000 mg | Freq: Three times a day (TID) | INTRAMUSCULAR | Status: DC | PRN
  Administered 2012-07-06 – 2012-07-07 (×2): 4 mg via INTRAVENOUS
  Filled 2012-07-06 (×2): qty 2

## 2012-07-06 MED ORDER — PIPERACILLIN-TAZOBACTAM 3.375 G IVPB
3.3750 g | Freq: Three times a day (TID) | INTRAVENOUS | Status: DC
  Administered 2012-07-07 – 2012-07-09 (×9): 3.375 g via INTRAVENOUS
  Filled 2012-07-06 (×10): qty 50

## 2012-07-06 MED ORDER — VANCOMYCIN HCL IN DEXTROSE 1-5 GM/200ML-% IV SOLN
1000.0000 mg | Freq: Once | INTRAVENOUS | Status: AC
  Administered 2012-07-06: 1000 mg via INTRAVENOUS
  Filled 2012-07-06: qty 200

## 2012-07-06 MED ORDER — ONDANSETRON HCL 4 MG/2ML IJ SOLN
4.0000 mg | Freq: Once | INTRAMUSCULAR | Status: AC
Start: 2012-07-06 — End: 2012-07-06
  Administered 2012-07-06: 4 mg via INTRAVENOUS
  Filled 2012-07-06: qty 2

## 2012-07-06 MED ORDER — ASPIRIN 81 MG PO CHEW
324.0000 mg | CHEWABLE_TABLET | ORAL | Status: AC
  Administered 2012-07-06: 324 mg via ORAL
  Filled 2012-07-06: qty 4

## 2012-07-06 MED ORDER — IOHEXOL 300 MG/ML  SOLN
25.0000 mL | INTRAMUSCULAR | Status: AC
  Administered 2012-07-06: 25 mL via ORAL

## 2012-07-06 MED ORDER — DILTIAZEM HCL 25 MG/5ML IV SOLN
5.0000 mg | Freq: Once | INTRAVENOUS | Status: AC
  Administered 2012-07-06: 5 mg via INTRAVENOUS
  Filled 2012-07-06: qty 5

## 2012-07-06 MED ORDER — POTASSIUM CHLORIDE CRYS ER 20 MEQ PO TBCR
40.0000 meq | EXTENDED_RELEASE_TABLET | ORAL | Status: AC
  Administered 2012-07-07 (×2): 40 meq via ORAL
  Filled 2012-07-06: qty 1
  Filled 2012-07-06: qty 2

## 2012-07-06 MED ORDER — PIPERACILLIN-TAZOBACTAM 3.375 G IVPB
3.3750 g | Freq: Once | INTRAVENOUS | Status: AC
  Administered 2012-07-06: 3.375 g via INTRAVENOUS
  Filled 2012-07-06: qty 50

## 2012-07-06 MED ORDER — SODIUM CHLORIDE 0.9 % IV SOLN: 250.0000 mL | INTRAVENOUS | Status: DC | PRN

## 2012-07-06 MED ORDER — POTASSIUM CHLORIDE IN NACL 40-0.9 MEQ/L-% IV SOLN
INTRAVENOUS | Status: DC
  Administered 2012-07-06: 18:00:00 via INTRAVENOUS
  Filled 2012-07-06 (×2): qty 1000

## 2012-07-06 MED ORDER — INSULIN ASPART 100 UNIT/ML ~~LOC~~ SOLN
0.0000 [IU] | SUBCUTANEOUS | Status: DC
  Administered 2012-07-06: 7 [IU] via SUBCUTANEOUS
  Administered 2012-07-07 (×2): 3 [IU] via SUBCUTANEOUS
  Administered 2012-07-07: 2 [IU] via SUBCUTANEOUS
  Administered 2012-07-07 (×2): 3 [IU] via SUBCUTANEOUS
  Administered 2012-07-07 (×2): 5 [IU] via SUBCUTANEOUS
  Administered 2012-07-08 (×2): 3 [IU] via SUBCUTANEOUS
  Administered 2012-07-08: 2 [IU] via SUBCUTANEOUS
  Administered 2012-07-08: 5 [IU] via SUBCUTANEOUS
  Administered 2012-07-08 – 2012-07-09 (×2): 3 [IU] via SUBCUTANEOUS
  Administered 2012-07-09: 5 [IU] via SUBCUTANEOUS
  Administered 2012-07-09: 3 [IU] via SUBCUTANEOUS
  Administered 2012-07-09 – 2012-07-10 (×4): 2 [IU] via SUBCUTANEOUS
  Administered 2012-07-10: 3 [IU] via SUBCUTANEOUS
  Administered 2012-07-10 (×2): 2 [IU] via SUBCUTANEOUS
  Administered 2012-07-11: 1 [IU] via SUBCUTANEOUS
  Administered 2012-07-11: 3 [IU] via SUBCUTANEOUS
  Administered 2012-07-11: 2 [IU] via SUBCUTANEOUS

## 2012-07-06 NOTE — H&P (Signed)
PULMONARY  / CRITICAL CARE MEDICINE  Name: Jo [name] MRN: [id number] DOB: [birth year]    ADMISSION DATE:  07/06/2012  REFERRING MD :  EDP PRIMARY SERVICE: PCCM  CHIEF COMPLAINT:  Hypotension   BRIEF PATIENT DESCRIPTION: 79 y/o F who presented to [facility] ER on 6/13 with abdominal pain & nausea.  Found to be hypoxic on RA with neg CTA Chest.  Abd CT with L renal obstructing calculi with hydronephrosis.  Had Afib with RVR in ER and Rx'd with Cardizem / lasix, subsequent hypotension.  PCCM consulted for SIRS / Hypotension.   SIGNIFICANT EVENTS / STUDIES:  6/13 - Admit with obstructing renal stone, hydro.  AFib with RVR, Rx'd then hypotensive.   LINES / TUBES:   CULTURES: UA 6/13>>>pos nitrite, 0-2 wbc, many bacteria, >1000k glucose BCx2 6/13>>> UC 6/13>>>  ANTIBIOTICS: Zosyn 6/13 (urinary / obs & asp)>>>  HISTORY OF PRESENT ILLNESS: 79 y/o F with a PMH of HTN, PAF s/p pacemaker on Eliquis, DM (diet controlled) who presented to [facility] ER on 6/13 with four day history of abdominal pain & nausea.  While in ER she was noted to have room air saturations in 80's and tachypnea.  CTA Chest negative for PE, suggestive of mild CHF, and RLL consolidative disease concerning for infection.  Abd CT demonstrated L renal obstructing calculi with hydronephrosis, extensive perinephric stranding.  Lactic acid 1.6.  Later in ER course, she had Afib with RVR and Rx'd with Cardizem / lasix, subsequent hypotension.  UA positive for nitrites, many bacteria, 0-2 WBC, greater than 1000k glucose.  PCCM consulted for SIRS / Hypotension.   Patient denies shortness of breath, abd pain, nausea, pain with urination, abnormal BM, chest pain, chest pain with inspiration. Last BM 6/12   PAST MEDICAL HISTORY :  Past Medical History  Diagnosis Date  . History of aortic insufficiency   . Anxiety   . Hypertension   . Pacemaker   . PAF (paroxysmal atrial fibrillation)     a. s/p pacemaker. b. s/p [employer] gen  change 07/2011.  . SSS (sick sinus syndrome)   . Type II diabetes mellitus     "controlled w/diet"  . Skin cancer ?1960's    "between breasts"  . Arthritis     "hands; between shoulders; back; feet"  . Panic attacks     "since losing husband 1994"  . Depression   . High risk medication use     on Flecainide  . Chronic anticoagulation   . Osteoporosis    Past Surgical History  Procedure Laterality Date  . Cesarean section  1955  . US echocardiography  07/25/2006    EF 55-60%  . US echocardiography  04/27/2004    EF 55-60%  . Cardiovascular stress test  12/09/2004  . Cataract extraction w/ intraocular lens  implant, bilateral  1990's  . Tonsillectomy      "school age"  . Appendectomy  1955  . Dilation and curettage of uterus  1953  . Breast cyst excision  ~ 1950    right  . Insert / replace / remove pacemaker  03/16/2006  . Skin cancer excision  ?1960's    "between my breasts"   Prior to Admission medications   Medication Sig Start Date End Date Taking? Authorizing Provider  apixaban (ELIQUIS) 2.5 MG TABS tablet Take 1 tablet (2.5 mg total) by mouth 2 (two) times daily. 03/07/12   Hillis Range, MD  Calcium Carbonate (CALCIUM 600 PO) Take 600 mg by mouth  daily.      Historical Provider, MD  carvedilol (COREG) 6.25 MG tablet Take 3.125 mg by mouth 2 (two) times daily with a meal.     Historical Provider, MD  diltiazem (CARDIZEM CD) 180 MG 24 hr capsule Take 1 capsule (180 mg total) by mouth daily. 02/21/12 02/20/13  Peter M [place], MD  docusate sodium (COLACE) 100 MG capsule Take 100 mg by mouth as needed.    Historical Provider, MD  estradiol (ESTRACE) 0.1 MG/GM vaginal cream Place 2 g vaginally 3 (three) times a week. Monday, Wednesday, friday    Historical Provider, MD  flecainide (TAMBOCOR) 50 MG tablet TAKE 1 TABLET BY MOUTH EVERY 12 HOURS 06/13/12   Jodelle Gross, NP  fludrocortisone (FLORINEF) 0.1 MG tablet Take 1 tablet (0.1 mg total) by mouth daily. 06/05/12   Brooke O  Edmisten, PA-C  LORazepam (ATIVAN) 0.5 MG tablet Take 0.5 mg by mouth 2 (two) times daily as needed. For sleep    Historical Provider, MD  rosuvastatin (CRESTOR) 5 MG tablet Take 2.5 mg by mouth See admin instructions. Monday-Friday    Historical Provider, MD  sertraline (ZOLOFT) 50 MG tablet Take 50 mg by mouth every morning.     Historical Provider, MD  Vitamin D, Ergocalciferol, (DRISDOL) 50000 UNITS CAPS Take 50,000 Units by mouth every Wednesday.     Historical Provider, MD   Allergies  Allergen Reactions  . Sulfonamide Derivatives Rash    "it was all over my legs"  . Citalopram Hydrobromide     Pt does not remember reaction    FAMILY HISTORY:  Family History  Problem Relation Age of Onset  . Emphysema Father    SOCIAL HISTORY:  reports that she has never smoked. She has never used smokeless tobacco. She reports that she does not drink alcohol or use illicit drugs.  REVIEW OF SYSTEMS:  See HPI for pertinent positives.  All other systems reviewed and are negative.   SUBJECTIVE: "I feel pretty good now"  VITAL SIGNS: Temp:  [98.5 F (36.9 C)] 98.5 F (36.9 C) (06/13 1034) Pulse Rate:  [90-124] 123 (06/13 1555) Resp:  [14-33] 29 (06/13 1555) BP: (88-218)/(50-108) 100/55 mmHg (06/13 1555) SpO2:  [89 %-100 %] 93 % (06/13 1555) FiO2 (%):  [40 %-100 %] 40 % (06/13 1555)  HEMODYNAMICS:    VENTILATOR SETTINGS: Vent Mode:  [-]  FiO2 (%):  [40 %-100 %] 40 %  INTAKE / OUTPUT: Intake/Output   None     PHYSICAL EXAMINATION: General:  Frail elderly female in NAD on 40% VM Neuro:  AAOx4, speech clear, MAE HEENT:  Mm pink/moist Cardiovascular:  s1s2 distant, irregular Lungs:  resp's shallow, non-labored, lungs bilaterally with faint posterior crackles Abdomen:  Round, mild distention but soft, bsx4 hypoactive Musculoskeletal:  No acute deformities Skin:  Warm/dry, no edema  LABS:  Recent Labs Lab 07/06/12 1028 07/06/12 1049 07/06/12 1155 07/06/12 1311  HGB 12.4   --   --   --   WBC 13.1*  --   --   --   PLT 103*  --   --   --   NA 135  --   --   --   K 3.0*  --   --   --   CL 98  --   --   --   CO2 26  --   --   --   GLUCOSE 237*  --   --   --   BUN 21  --   --   --  CREATININE 0.86  --   --   --   CALCIUM 9.2  --   --   --   AST 28  --   --   --   ALT 52*  --   --   --   ALKPHOS 95  --   --   --   BILITOT 1.0  --   --   --   PROT 6.9  --   --   --   ALBUMIN 3.5  --   --   --   INR 1.41  --   --   --   LATICACIDVEN  --  1.60  --   --   TROPONINI <0.30  --   --   --   PROBNP  --   --  2584.0*  --   PHART  --   --   --  7.379  PCO2ART  --   --   --  42.5  PO2ART  --   --   --  60.0*   No results found for this basename: GLUCAP,  in the last 168 hours  CXR: 6/13 - no overt infiltrate, cephalization noted  ASSESSMENT / PLAN:  PULMONARY A: Hypoxic Respiratory Failure - neg CTA for PE.  Minimal volume up.  Hx of living with smoker.  Questionable RLL Consolidation - noted on CT Trace Bilateral Pleural Effusions   P:   -oxygen to support sats 90-95% -pcxr in am   CARDIOVASCULAR A:  Atrial Fibrillation w RVR- hx of, s/p pacemaker on flecanide Hypotension - likely med related / volume with presumed urosepsis.  Hx of florinef use with PCP SSS   P:  -Cardiology Consult -cautiously continue cardizem gtt for now -hold home medications for now, will defer to cardiology -will likely need IV heparin gtt for Afib, hold Eliquis -Nemmers need TLC placement for CVP / neo  RENAL A:   L Renal Calclui - with associated hydronephrosis At Risk ATN - s/p contrast dye for CTA chest / abd.  Received 500 ml NS in ER Hypokalemia   P:   -gentle volume in setting of dye, afib with RVR -Urology called per ER & recommends hold off on nephrostomy unless worsening of hydronephrosis -consider repeat renal US in am and IR consult for drain if worsening of hyrdo -NS with KCL -reassess renal function at 2200   GASTROINTESTINAL A:  Nutrion. P:    -clear liquid diet  HEMATOLOGIC A:   Thrombocytopenia - no evidence of acute bleeding  P:  -monitor platelets, concern for urosepsis  INFECTIOUS A:   L Obstructing Renal Calculi RLL Consolidative Airspace Disease   P:   -treat empirically for urine & pulmonary source infection -assess PCT algorithm -zosyn for questionable RLL airspace disease & urinary coverage -hold vanco for now (recieved dose in ER)  ENDOCRINE A:   Diet Controlled DM Hyperglycemia     P:   -SSI  NEUROLOGIC A:   Depression / Anxiety   P: -consider restart zoloft, ativan in am 6/14   -PRN tylenol for pain   I have personally obtained a history, examined the patient, evaluated laboratory and imaging results, formulated the assessment and plan and placed orders.  CRITICAL CARE: The patient is critically ill with multiple organ systems failure and requires high complexity decision making for assessment and support, frequent evaluation and titration of therapies, application of advanced monitoring technologies and extensive interpretation of multiple databases. Critical Care Time devoted to patient care services  described in this note is 40 minutes.    Reviewed above, examined pt, and agree with assessment/plan.  She has nephrolithiasis causing hydronephrosis and probable urosepsis.  This has resulted in A fib with RVR and acute diastolic heart failure.  Will continue Abx and appreciate help from cardiology and urology.  Coralyn Helling, MD [facility] Pulmonary/Critical Care 07/06/2012, 6:20 PM Pager:  [phone] After 3pm call: [phone]

## 2012-07-06 NOTE — ED Notes (Signed)
C/o intermittent nausea & abd pain since Tuesday. Per EMS - POX 85% RA, placed on oxygen

## 2012-07-06 NOTE — ED Notes (Signed)
Patient transported to CT 

## 2012-07-06 NOTE — ED Provider Notes (Signed)
History     CSN: [id number]  Arrival date & time 07/06/12  1022   First MD Initiated Contact with Patient 07/06/12 1026      Chief Complaint  Patient presents with  . Abdominal Pain  . Nausea    (Consider location/radiation/quality/duration/timing/severity/associated sxs/prior treatment) HPI Comments: Patient presents via EMS with lower abdominal pain and nausea as been ongoing for the past 4 days . She denies any vomiting or chest pain.  She reports to reduced by mouth intake. On EMS arrival she was found to be hypoxic in the 70s on room air. He denies any shortness of breath or chest pain. She denies any cough or fever. She states she's been having small bowel movements. She has a history of a fibrillation with a pacemaker and is on eliquis. She denies any leg pain or swelling.  The history is provided by the patient and the EMS personnel. The history is limited by the condition of the patient.    Past Medical History  Diagnosis Date  . History of aortic insufficiency   . Anxiety   . Hypertension   . Pacemaker   . PAF (paroxysmal atrial fibrillation)     a. s/p pacemaker. b. s/p [employer] gen change 07/2011.  . SSS (sick sinus syndrome)   . Type II diabetes mellitus     "controlled w/diet"  . Skin cancer ?1960's    "between breasts"  . Arthritis     "hands; between shoulders; back; feet"  . Panic attacks     "since losing husband 1994"  . Depression   . High risk medication use     on Flecainide  . Chronic anticoagulation   . Osteoporosis     Past Surgical History  Procedure Laterality Date  . Cesarean section  1955  . Cardiovascular stress test  12/09/2004  . Cataract extraction w/ intraocular lens  implant, bilateral  1990's  . Tonsillectomy      "school age"  . Appendectomy  1955  . Dilation and curettage of uterus  1953  . Breast cyst excision  ~ 1950    right  . Insert / replace / remove pacemaker  03/16/2006  . Skin cancer excision  ?1960's    "between  my breasts"    Family History  Problem Relation Age of Onset  . Emphysema Father     History  Substance Use Topics  . Smoking status: Never Smoker   . Smokeless tobacco: Never Used  . Alcohol Use: No    OB History   Grav Para Term Preterm Abortions TAB SAB Ect Mult Living                  Review of Systems  Constitutional: Positive for activity change and appetite change. Negative for fever.  HENT: Negative for congestion and rhinorrhea.   Eyes: Negative for visual disturbance.  Respiratory: Negative for cough, chest tightness and shortness of breath.   Cardiovascular: Negative for chest pain.  Gastrointestinal: Positive for nausea, abdominal pain and constipation. Negative for vomiting and diarrhea.  Genitourinary: Negative for dysuria, hematuria, vaginal bleeding and vaginal discharge.  Musculoskeletal: Negative for back pain.  Neurological: Negative for dizziness, weakness and headaches.  A complete 10 system review of systems was obtained and all systems are negative except as noted in the HPI and PMH.    Allergies  Sulfonamide derivatives and Citalopram hydrobromide  Home Medications   Current Outpatient Rx  Name  Route  Sig  Dispense  Refill  .  apixaban (ELIQUIS) 2.5 MG TABS tablet   Oral   Take 1 tablet (2.5 mg total) by mouth 2 (two) times daily.   60 tablet   4   . Calcium Carbonate (CALCIUM 600 PO)   Oral   Take 600 mg by mouth daily.           . carvedilol (COREG) 6.25 MG tablet   Oral   Take 3.125 mg by mouth 2 (two) times daily with a meal.         . diltiazem (CARDIZEM CD) 180 MG 24 hr capsule   Oral   Take 1 capsule (180 mg total) by mouth daily.   30 capsule   12   . docusate sodium (COLACE) 100 MG capsule   Oral   Take 100 mg by mouth as needed.         Marland Kitchen estradiol (ESTRACE) 0.1 MG/GM vaginal cream   Vaginal   Place 2 g vaginally 3 (three) times a week. Monday, Wednesday, friday         . flecainide (TAMBOCOR) 50 MG  tablet   Oral   Take 50 mg by mouth 2 (two) times daily.         Marland Kitchen LORazepam (ATIVAN) 0.5 MG tablet   Oral   Take 0.5 mg by mouth 2 (two) times daily as needed. For sleep         . rosuvastatin (CRESTOR) 5 MG tablet   Oral   Take 2.5 mg by mouth See admin instructions. Monday-Friday         . sertraline (ZOLOFT) 50 MG tablet   Oral   Take 50 mg by mouth every morning.          . Vitamin D, Ergocalciferol, (DRISDOL) 50000 UNITS CAPS   Oral   Take 50,000 Units by mouth every Wednesday.            BP 108/52  Pulse 144  Temp(Src) 98.5 F (36.9 C) (Oral)  Resp 25  SpO2 93%  Physical Exam  Constitutional: She is oriented to person, place, and time. She appears well-developed and well-nourished. No distress.  HENT:  Head: Normocephalic and atraumatic.  Mouth/Throat: Oropharynx is clear and moist. No oropharyngeal exudate.  Eyes: Conjunctivae and EOM are normal. Pupils are equal, round, and reactive to light.  Neck: Normal range of motion. Neck supple.  Cardiovascular: Normal rate, regular rhythm and normal heart sounds.   No murmur heard. Pulmonary/Chest: Effort normal. No respiratory distress. She has rales.  Faint crackles in bases  Abdominal: Soft. There is tenderness. There is no rebound and no guarding.  Diffuse tenderness without guarding or rebound  Genitourinary: Guaiac negative stool.  Brown stool, no impaction  Musculoskeletal: Normal range of motion. She exhibits no edema and no tenderness.  Neurological: She is alert and oriented to person, place, and time. No cranial nerve deficit. She exhibits normal muscle tone. Coordination normal.  Skin: Skin is warm.    ED Course  Procedures (including critical care time)  Labs Reviewed  CBC WITH DIFFERENTIAL - Abnormal; Notable for the following:    WBC 13.1 (*)    HCT 35.1 (*)    Platelets 103 (*)    Neutrophils Relative % 92 (*)    Neutro Abs 12.1 (*)    Lymphocytes Relative 5 (*)    Lymphs Abs 0.6  (*)    All other components within normal limits  COMPREHENSIVE METABOLIC PANEL - Abnormal; Notable for the following:  Potassium 3.0 (*)    Glucose, Bld 237 (*)    ALT 52 (*)    GFR calc non Af Amer 56 (*)    GFR calc Af Amer 65 (*)    All other components within normal limits  URINALYSIS, ROUTINE W REFLEX MICROSCOPIC - Abnormal; Notable for the following:    APPearance CLOUDY (*)    Glucose, UA >1000 (*)    Hgb urine dipstick MODERATE (*)    Ketones, ur 15 (*)    Protein, ur 30 (*)    Nitrite POSITIVE (*)    All other components within normal limits  PROTIME-INR - Abnormal; Notable for the following:    Prothrombin Time 16.9 (*)    All other components within normal limits  PRO B NATRIURETIC PEPTIDE - Abnormal; Notable for the following:    Pro B Natriuretic peptide (BNP) 2584.0 (*)    All other components within normal limits  URINE MICROSCOPIC-ADD ON - Abnormal; Notable for the following:    Bacteria, UA MANY (*)    All other components within normal limits  POCT I-STAT 3, BLOOD GAS (G3+) - Abnormal; Notable for the following:    pO2, Arterial 60.0 (*)    Bicarbonate 25.1 (*)    All other components within normal limits  CULTURE, BLOOD (ROUTINE X 2)  CULTURE, BLOOD (ROUTINE X 2)  URINE CULTURE  LIPASE, BLOOD  TROPONIN I  CBC  BASIC METABOLIC PANEL  BASIC METABOLIC PANEL  OCCULT BLOOD, POC DEVICE  CG4 I-STAT (LACTIC ACID)   Ct Angio Chest Pe W/cm &/or Wo Cm  07/06/2012   *RADIOLOGY REPORT*  Clinical Data:  Shortness of breath, abdominal pain, nausea.  CT ANGIOGRAPHY CHEST CT ABDOMEN AND PELVIS WITH CONTRAST  Technique:  Multidetector CT imaging of the chest was performed using the standard protocol during bolus administration of intravenous contrast.  Multiplanar CT image reconstructions including MIPs were obtained to evaluate the vascular anatomy. Multidetector CT imaging of the abdomen and pelvis was performed using the standard protocol during bolus administration  of intravenous contrast.  Contrast: OMNIPAQUE IOHEXOL 350 MG/ML SOLN  Comparison:  CT of the pelvis of 02/12/2007.  CTA CHEST  Findings:  Mediastinum: Study is slightly limited by respiratory motion.  With these limitations in mind, there is no evidence of clinically relevant central, lobar or segmental sized pulmonary embolism. Unfortunately, smaller subsegmental sized pulmonary emboli cannot be accurately excluded on this examination. Heart size is borderline enlarged. There is no significant pericardial fluid, thickening or pericardial calcification.  Left-sided pacemaker device in place with lead tips terminating in the right atrial appendage and the right ventricular apex.  A small hiatal hernia. The No pathologically enlarged mediastinal or hilar lymph nodes. There is atherosclerosis of the thoracic aorta, the great vessels of the mediastinum and the coronary arteries, including calcified atherosclerotic plaque in the left anterior descending and right coronary arteries.  Lungs/Pleura: Trace right and small left pleural effusions are simple in appearance layering dependently.  A small area of consolidative air space disease in the posterior aspect of the right lower lobe.  Multifocal other areas of ground-glass attenuation and extensive interlobular septal thickening noted throughout the lungs bilaterally, as well as peribronchovascular interstitial thickening, favored to represent a manifestation of pulmonary edema.  Laxity of the trachea and mainstem bronchi bilaterally which appears to change with respiration, compatible with tracheobronchial malacia.  Musculoskeletal: There are no aggressive appearing lytic or blastic lesions noted in the visualized portions of the skeleton.  Review of the MIP images confirms the above findings.  IMPRESSION:  1.  Despite the limitations of this examination, there is no evidence to suggest clinically relevant central, lobar or segmental sized pulmonary embolism. 2.   The appearance of the chest is most compatible with mild congestive heart failure. 3.  More confluent consolidative airspace disease in the posterior aspect of the right lower lobe [place] reflects sequelae of mild aspiration or a developing site of infection. 4. Atherosclerosis, including two-vessel coronary artery disease. 5.  Tracheobronchial malacia. 6.  Additional incidental findings, as above.  CT ABDOMEN AND PELVIS  Findings:  Abdomen/Pelvis:  Image 58 of series 8 demonstrates a 7 mm calculus in the distal third of the left ureter shortly before the left ureterovesicular junction.  This is associated with mild to moderate left-sided hydroureteronephrosis extensive perinephric stranding, compatible with obstruction.  The extrarenal pelvis is noted on the right side (normal anatomical variant).  5.1 x 3.4 x 4.0 cm mass in the right adrenal gland has internal fatty attenuation, most compatible with a large adrenal myelolipoma. There is also a 3.2 x 2.5 x 2.6 cm left adrenal mass which is indeterminate.  This mass measures 94 HU on the portal venous phase imaging, and 58 HU on the 3-minute post contrast delayed imaging, for a relative washout of approximately 39.4% (lesions with relative washout of less than 40% are considered indeterminate, however, that is based on a 15-minute delay).  A subcentimeter low attenuation lesion in segment three of the liver is too small to definitively characterize, but statistically likely a small cyst. Minimal intrahepatic biliary ductal dilatation.  Common bile duct is normal in caliber.  The appearance of the gallbladder and spleen is unremarkable.  A few pancreatic calcifications [place] be related to prior episodes of pancreatitis.  Small volume of perihepatic ascites.  No pneumoperitoneum.  No pathologic distension of small bowel.  Numerous reactive sized retroperitoneal lymph nodes are noted, but no definite pathologically enlarged abdominal or pelvic lymph nodes are identified on  today's examination.  Uterus and ovaries are atrophic, but otherwise unremarkable in appearance.  Musculoskeletal: Old healed fractures of the parasymphyseal aspect of the right pelvis involving both superior and inferior pubic rami are noted. There are no aggressive appearing lytic or blastic lesions noted in the visualized portions of the skeleton.  Review of the MIP images confirms the above findings.  IMPRESSION: 1.  7 mm partially obstructive calculus in the distal third of the left ureter with mild to moderate left-sided hydroureteronephrosis and extensive left-sided perinephric stranding.  2.  5.1 x 3.4 x 4.0 cm fatty attenuation mass in the right adrenal gland is compatible with a large myelolipoma. 3.  In addition, there is a 2.5 x 3.2 x 2.6 cm mass in the left adrenal gland which by washout characteristics almost certainly represents an adenoma. If for some clinical reason there is concern for metastatic lesion or primary adrenal malignancy, correlation with noncontrast CT of the abdomen could be obtained (the patient has a pacemaker device and is not a candidate for MRI examination). 4.  Atherosclerosis. 5.  Minimal intrahepatic biliary ductal dilatation without evidence of common bile duct or pancreatic duct dilatation.  This finding is of uncertain etiology and significance, but Buday be chronic in this patient.  Correlation with liver function tests is suggested. 6.  Small volume of perihepatic ascites.   Original Report Authenticated By: Trudie Reed, M.D.   Ct Abdomen Pelvis W Contrast  07/06/2012   *RADIOLOGY REPORT*  Clinical Data:  Shortness of breath, abdominal pain, nausea.  CT ANGIOGRAPHY CHEST CT ABDOMEN AND PELVIS WITH CONTRAST  Technique:  Multidetector CT imaging of the chest was performed using the standard protocol during bolus administration of intravenous contrast.  Multiplanar CT image reconstructions including MIPs were obtained to evaluate the vascular anatomy. Multidetector CT  imaging of the abdomen and pelvis was performed using the standard protocol during bolus administration of intravenous contrast.  Contrast: OMNIPAQUE IOHEXOL 350 MG/ML SOLN  Comparison:  CT of the pelvis of 02/12/2007.  CTA CHEST  Findings:  Mediastinum: Study is slightly limited by respiratory motion.  With these limitations in mind, there is no evidence of clinically relevant central, lobar or segmental sized pulmonary embolism. Unfortunately, smaller subsegmental sized pulmonary emboli cannot be accurately excluded on this examination. Heart size is borderline enlarged. There is no significant pericardial fluid, thickening or pericardial calcification.  Left-sided pacemaker device in place with lead tips terminating in the right atrial appendage and the right ventricular apex.  A small hiatal hernia. The No pathologically enlarged mediastinal or hilar lymph nodes. There is atherosclerosis of the thoracic aorta, the great vessels of the mediastinum and the coronary arteries, including calcified atherosclerotic plaque in the left anterior descending and right coronary arteries.  Lungs/Pleura: Trace right and small left pleural effusions are simple in appearance layering dependently.  A small area of consolidative air space disease in the posterior aspect of the right lower lobe.  Multifocal other areas of ground-glass attenuation and extensive interlobular septal thickening noted throughout the lungs bilaterally, as well as peribronchovascular interstitial thickening, favored to represent a manifestation of pulmonary edema.  Laxity of the trachea and mainstem bronchi bilaterally which appears to change with respiration, compatible with tracheobronchial malacia.  Musculoskeletal: There are no aggressive appearing lytic or blastic lesions noted in the visualized portions of the skeleton.   Review of the MIP images confirms the above findings.  IMPRESSION:  1.  Despite the limitations of this examination, there  is no evidence to suggest clinically relevant central, lobar or segmental sized pulmonary embolism. 2.  The appearance of the chest is most compatible with mild congestive heart failure. 3.  More confluent consolidative airspace disease in the posterior aspect of the right lower lobe [place] reflects sequelae of mild aspiration or a developing site of infection. 4. Atherosclerosis, including two-vessel coronary artery disease. 5.  Tracheobronchial malacia. 6.  Additional incidental findings, as above.  CT ABDOMEN AND PELVIS  Findings:  Abdomen/Pelvis:  Image 58 of series 8 demonstrates a 7 mm calculus in the distal third of the left ureter shortly before the left ureterovesicular junction.  This is associated with mild to moderate left-sided hydroureteronephrosis extensive perinephric stranding, compatible with obstruction.  The extrarenal pelvis is noted on the right side (normal anatomical variant).  5.1 x 3.4 x 4.0 cm mass in the right adrenal gland has internal fatty attenuation, most compatible with a large adrenal myelolipoma. There is also a 3.2 x 2.5 x 2.6 cm left adrenal mass which is indeterminate.  This mass measures 94 HU on the portal venous phase imaging, and 58 HU on the 3-minute post contrast delayed imaging, for a relative washout of approximately 39.4% (lesions with relative washout of less than 40% are considered indeterminate, however, that is based on a 15-minute delay).  A subcentimeter low attenuation lesion in segment three of the liver is too small to definitively characterize, but statistically likely a small cyst. Minimal intrahepatic biliary ductal dilatation.  Common bile duct is normal in caliber.  The appearance of the gallbladder and spleen is unremarkable.  A few pancreatic calcifications [place] be related to prior episodes of pancreatitis.  Small volume of perihepatic ascites.  No pneumoperitoneum.  No pathologic distension of small bowel.  Numerous reactive sized retroperitoneal lymph  nodes are noted, but no definite pathologically enlarged abdominal or pelvic lymph nodes are identified on today's examination.  Uterus and ovaries are atrophic, but otherwise unremarkable in appearance.  Musculoskeletal: Old healed fractures of the parasymphyseal aspect of the right pelvis involving both superior and inferior pubic rami are noted. There are no aggressive appearing lytic or blastic lesions noted in the visualized portions of the skeleton.  Review of the MIP images confirms the above findings.  IMPRESSION: 1.  7 mm partially obstructive calculus in the distal third of the left ureter with mild to moderate left-sided hydroureteronephrosis and extensive left-sided perinephric stranding.  2.  5.1 x 3.4 x 4.0 cm fatty attenuation mass in the right adrenal gland is compatible with a large myelolipoma. 3.  In addition, there is a 2.5 x 3.2 x 2.6 cm mass in the left adrenal gland which by washout characteristics almost certainly represents an adenoma. If for some clinical reason there is concern for metastatic lesion or primary adrenal malignancy, correlation with noncontrast CT of the abdomen could be obtained (the patient has a pacemaker device and is not a candidate for MRI examination). 4.  Atherosclerosis. 5.  Minimal intrahepatic biliary ductal dilatation without evidence of common bile duct or pancreatic duct dilatation.  This finding is of uncertain etiology and significance, but [place] be chronic in this patient.  Correlation with liver function tests is suggested. 6.  Small volume of perihepatic ascites.   Original Report Authenticated By: Trudie Reed, M.D.   Dg Abd Acute W/chest  07/06/2012   *RADIOLOGY REPORT*  Clinical Data: Abdominal pain  ACUTE ABDOMEN SERIES (ABDOMEN 2 VIEW & CHEST 1 VIEW)  Comparison: Chest 06/04/2012  Findings: Increased lung markings diffusely and bilaterally compared to the prior study.  This Barbone be due to heart failure and vascular congestion.  Acute pulmonary  infection diffusely is also possible.  Negative for lumbar infiltrate.  Pacemaker in good position.  No effusion.  Abdomen:  Nonobstructive bowel gas pattern.  No free air.  No dilated bowel loops.  No renal calculi.  IMPRESSION: Prominent lung markings diffusely and bilaterally suggestive of fluid overload however diffuse infection also possible  Nonobstructive bowel gas pattern.   Original Report Authenticated By: Janeece Riggers, M.D.     1. Aspiration pneumonia   2. Atrial fibrillation with rapid ventricular response   3. Nephrolithiasis   4. Urinary tract infection   5. Acute on chronic diastolic HF (heart failure)       MDM  4 days of nausea and abdominal pain. Hypoxic on arrival with increased work of breathing. Denies shortness of breath, fever, cough or chest pain.  Abdomen is soft without peritoneal signs. Distal pulses are intact.  X-ray shows evidence of bilateral congestion versus infiltrate. Echocardiogram in July 2013 did not show any systolic or diastolic dysfunction. Appears mildly volume up. IV lasix given.   Hyperglycemia without evidence of DKA.   Broad spectrum antibiotics begun, cultures obtained. Question aspiration.  Patient developed afib with RVR, treated with cardizem.  Subsequently became hypotensive to 80s, was asymptomatic and improved with fluids.  7mm obstructing stone on CT, infected UA.  D/w Dr. Brunilda Payor who does not recommend intervention  at this point given comorbities but patient [place] need IR nephrostomy tube.   Date: 07/06/2012  Rate: 95  Rhythm: normal sinus rhythm  QRS Axis: normal  Intervals: normal  ST/T Wave abnormalities: normal  Conduction Disutrbances: first degree AV block  Narrative Interpretation:   Old EKG Reviewed: unchanged  CRITICAL CARE Performed by: Glynn Octave Total critical care time: 40 Critical care time was exclusive of separately billable procedures and treating other patients. Critical care was necessary to treat or  prevent imminent or life-threatening deterioration. Critical care was time spent personally by me on the following activities: development of treatment plan with patient and/or surrogate as well as nursing, discussions with consultants, evaluation of patient's response to treatment, examination of patient, obtaining history from patient or surrogate, ordering and performing treatments and interventions, ordering and review of laboratory studies, ordering and review of radiographic studies, pulse oximetry and re-evaluation of patient's condition.      Glynn Octave, MD 07/06/12 1816

## 2012-07-06 NOTE — Progress Notes (Signed)
eLink Physician-Brief Progress Note Patient Name: Jo [name] DOB: 08-14-1917 MRN: [id number]  Date of Service  07/06/2012   HPI/Events of Note  Patient c/o of nausea   eICU Interventions  Plan: Zofran 4 mg IV q8 hours prn N/V   Intervention Category Minor Interventions: Routine modifications to care plan (e.g. PRN medications for pain, fever)  DETERDING,ELIZABETH 07/06/2012, 9:25 PM

## 2012-07-06 NOTE — ED Notes (Signed)
Attempted report 

## 2012-07-06 NOTE — ED Notes (Signed)
Ultrasound at bedside

## 2012-07-06 NOTE — ED Notes (Signed)
PCCM at bedside talking with pt & family '

## 2012-07-06 NOTE — ED Notes (Signed)
Denies vomiting, diarrhea, fever, chills, cold, cough, SOB, CP, urinary frequency, dysuria. Last BM yesterday.

## 2012-07-06 NOTE — Consult Note (Signed)
CARDIOLOGY CONSULT NOTE  Patient ID: Jo [name] MRN: [id number] DOB/AGE: Oct 26, 1917 79 y.o.  Admit date: 07/06/2012 Primary Physician Ezequiel Kayser, MD Primary Cardiologist  Drs Johney Frame and [place] Chief Complaint  Abdominal pain.    HPI:  The patient has a history of PAF with tachybrady and a pacemaker. Afib has been treated with flecainide.  She was recently discharged from the hospital after treatment for orthostatic dizziness.  She was started on florinef.    She presented to the ER with abdominal pain and nausea x 4 days.  She had been in her usual state of health which includes living independently. However, she had some shoulder pain which she thought was arthritis earlier in the week. Her biggest complaint has been some abdominal pain and she thought she was constipated. However, she became progressively weaker and called EMS. She doesn't describe acute shortness of breath, PND or orthopnea. She hasn't been describing any chest pressure, palpitations, presyncope or syncope. He's had no weight gain or edema. While she has been nauseated she's not been able to throw up. EMS found her to be hypoxic with sats in the 70s on room air.  Pro BNP is slightly elevated.  CT suggested some pulmonary edema.  She does have a partially obstructive renal stone in the left ureter.  She is incidentally found to have adrenal adenoma.  Finally there is consolidation on her chest CT suggested possible pneumonia.  Of note in the emergency room she did develop a rapid ventricular rate and followup EKG suggested fibrillation/flutter. She's been treated with low-dose of IV Cardizem.   Past Medical History  Diagnosis Date  . History of aortic insufficiency   . Anxiety   . Hypertension   . Pacemaker   . PAF (paroxysmal atrial fibrillation)     a. s/p pacemaker. b. s/p [employer] gen change 07/2011.  . SSS (sick sinus syndrome)   . Type II diabetes mellitus     "controlled w/diet"  . Skin cancer ?1960's   "between breasts"  . Arthritis     "hands; between shoulders; back; feet"  . Panic attacks     "since losing husband 1994"  . Depression   . High risk medication use     on Flecainide  . Chronic anticoagulation   . Osteoporosis     Past Surgical History  Procedure Laterality Date  . Cesarean section  1955  . US echocardiography  07/25/2006    EF 55-60%  . US echocardiography  04/27/2004    EF 55-60%  . Cardiovascular stress test  12/09/2004  . Cataract extraction w/ intraocular lens  implant, bilateral  1990's  . Tonsillectomy      "school age"  . Appendectomy  1955  . Dilation and curettage of uterus  1953  . Breast cyst excision  ~ 1950    right  . Insert / replace / remove pacemaker  03/16/2006  . Skin cancer excision  ?1960's    "between my breasts"    Allergies  Allergen Reactions  . Sulfonamide Derivatives Rash    "it was all over my legs"  . Citalopram Hydrobromide     Pt does not remember reaction   Prior to Admission medications   Medication Sig Start Date End Date Taking? Authorizing Provider  apixaban (ELIQUIS) 2.5 MG TABS tablet Take 1 tablet (2.5 mg total) by mouth 2 (two) times daily. 03/07/12  Yes Hillis Range, MD  Calcium Carbonate (CALCIUM 600 PO) Take 600 mg by mouth daily.  Yes Historical Provider, MD  carvedilol (COREG) 6.25 MG tablet Take 3.125 mg by mouth 2 (two) times daily with a meal.   Yes Historical Provider, MD  diltiazem (CARDIZEM CD) 180 MG 24 hr capsule Take 1 capsule (180 mg total) by mouth daily. 02/21/12 02/20/13 Yes Peter M [place], MD  docusate sodium (COLACE) 100 MG capsule Take 100 mg by mouth as needed.   Yes Historical Provider, MD  estradiol (ESTRACE) 0.1 MG/GM vaginal cream Place 2 g vaginally 3 (three) times a week. Monday, Wednesday, friday   Yes Historical Provider, MD  flecainide (TAMBOCOR) 50 MG tablet Take 50 mg by mouth 2 (two) times daily.   Yes Historical Provider, MD  LORazepam (ATIVAN) 0.5 MG tablet Take 0.5 mg by mouth 2  (two) times daily as needed. For sleep   Yes Historical Provider, MD  rosuvastatin (CRESTOR) 5 MG tablet Take 2.5 mg by mouth See admin instructions. Monday-Friday   Yes Historical Provider, MD  sertraline (ZOLOFT) 50 MG tablet Take 50 mg by mouth every morning.    Yes Historical Provider, MD  Vitamin D, Ergocalciferol, (DRISDOL) 50000 UNITS CAPS Take 50,000 Units by mouth every\  Wednesday.    Yes Historical Provider, MD     (Not in a hospital admission) Family History  Problem Relation Age of Onset  . Emphysema Father     History   Social History  . Marital Status: Widowed    Spouse Name: N/A    Number of Children: 2  . Years of Education: N/A   Occupational History  . Not on file.   Social History Main Topics  . Smoking status: Never Smoker   . Smokeless tobacco: Never Used  . Alcohol Use: No  . Drug Use: No  . Sexually Active: No   Other Topics Concern  . Not on file   Social History Narrative  . No narrative on file     ROS:  As stated in the HPI and negative for all other systems.  Physical Exam: Blood pressure 100/64, pulse 125, temperature 98.5 F (36.9 C), temperature source Oral, resp. rate 21, SpO2 92.00%.  GENERAL:  Well appearing for her age HEENT:  Pupils equal round and reactive, fundi not visualized, oral mucosa unremarkable NECK:  No jugular venous distention, waveform within normal limits, carotid upstroke brisk and symmetric, no bruits, no thyromegaly LYMPHATICS:  No cervical, inguinal adenopathy LUNGS:  Bilateral crackles one third of the way up BACK:  No CVA tenderness CHEST:  Unremarkable HEART:  PMI not displaced or sustained,S1 and S2 within normal limits, no S3, no S4, no clicks, no rubs, no murmurs ABD:  Flat, positive bowel sounds normal in frequency in pitch, no bruits, no rebound, no guarding, no midline pulsatile mass, no hepatomegaly, no splenomegaly EXT:  2 plus pulses throughout, no edema, no cyanosis no clubbing SKIN:  No rashes  no nodules NEURO:  Cranial nerves II through XII grossly intact, motor grossly intact throughout PSYCH:  Cognitively intact, oriented to person place and time   Labs: Lab Results  Component Value Date   BUN 21 07/06/2012   Lab Results  Component Value Date   CREATININE 0.86 07/06/2012   Lab Results  Component Value Date   NA 135 07/06/2012   K 3.0* 07/06/2012   CL 98 07/06/2012   CO2 26 07/06/2012   Lab Results  Component Value Date   TROPONINI <0.30 07/06/2012   Lab Results  Component Value Date   WBC 13.1* 07/06/2012  HGB 12.4 07/06/2012   HCT 35.1* 07/06/2012   MCV 90.5 07/06/2012   PLT 103* 07/06/2012   No results found for this basename: CHOL, HDL, LDLCALC, LDLDIRECT, TRIG, CHOLHDL   Lab Results  Component Value Date   ALT 52* 07/06/2012   AST 28 07/06/2012   ALKPHOS 95 07/06/2012   BILITOT 1.0 07/06/2012     Radiology:   CT:  1. Despite the limitations of this examination, there is no  evidence to suggest clinically relevant central, lobar or segmental  sized pulmonary embolism.  2. The appearance of the chest is most compatible with mild  congestive heart failure.  3. More confluent consolidative airspace disease in the posterior  aspect of the right lower lobe [place] reflects sequelae of mild  aspiration or a developing site of infection.  4. Atherosclerosis, including two-vessel coronary artery disease.  5. Tracheobronchial malacia.  6. Additional incidental findings, as above.  EKG:  Fib/flutter with RVR.  Axis WNL, intervals WNL, no acute ST T wave changes.  07/06/2012  ASSESSMENT AND PLAN:   ATRIAL FIB/FLUTTER:  I would suggest continuing flecainide and beta blocker at the previous dose. Unless she is to have an invasive procedure she can have Eliquis continued.  If we suspect she will need instrumentation I would suggest holding the Eliquis for 48 hours and probably bridging with heparin as she is actively in fibrillation .  We can continue IV Cardizem for now.    DIASTOLIC HF:  he does seem to be mildly volume overloaded. She received some IV Lasix in the emergency room. It might be prudent to continue with the dose of this tomorrow morning and then reassessing.    HTN:  This will be managed in the context of treating the above.    SignedRollene Rotunda 07/06/2012, 5:05 PM

## 2012-07-06 NOTE — ED Notes (Signed)
500 ml NS bolus started for SBP 80's

## 2012-07-06 NOTE — ED Notes (Signed)
HR 122/min when cardizem given & started

## 2012-07-06 NOTE — ED Notes (Signed)
Patient transported to X-ray 

## 2012-07-06 NOTE — Progress Notes (Signed)
eLink Physician-Brief Progress Note Patient Name: Jo [name] DOB: [birth year] MRN: [id number]  Date of Service  07/06/2012   HPI/Events of Note  Hypokalemia   eICU Interventions  Potassium replaced   Intervention Category Intermediate Interventions: Electrolyte abnormality - evaluation and management  DETERDING,ELIZABETH 07/06/2012, 11:47 PM

## 2012-07-06 NOTE — ED Notes (Signed)
Bedside renal ultrasound complete

## 2012-07-06 NOTE — ED Notes (Signed)
POX down to 90% on 4L , increased to NRB 100%. Increased POX to 99%. Continues to deny SOB.

## 2012-07-07 ENCOUNTER — Inpatient Hospital Stay (HOSPITAL_COMMUNITY): Payer: Medicare Other

## 2012-07-07 DIAGNOSIS — N201 Calculus of ureter: Secondary | ICD-10-CM | POA: Diagnosis not present

## 2012-07-07 DIAGNOSIS — J189 Pneumonia, unspecified organism: Secondary | ICD-10-CM | POA: Diagnosis not present

## 2012-07-07 DIAGNOSIS — A4159 Other Gram-negative sepsis: Secondary | ICD-10-CM | POA: Diagnosis not present

## 2012-07-07 DIAGNOSIS — J690 Pneumonitis due to inhalation of food and vomit: Secondary | ICD-10-CM | POA: Diagnosis not present

## 2012-07-07 DIAGNOSIS — R1032 Left lower quadrant pain: Secondary | ICD-10-CM | POA: Diagnosis not present

## 2012-07-07 DIAGNOSIS — A4150 Gram-negative sepsis, unspecified: Secondary | ICD-10-CM

## 2012-07-07 DIAGNOSIS — J9819 Other pulmonary collapse: Secondary | ICD-10-CM | POA: Diagnosis not present

## 2012-07-07 DIAGNOSIS — J9600 Acute respiratory failure, unspecified whether with hypoxia or hypercapnia: Secondary | ICD-10-CM | POA: Diagnosis not present

## 2012-07-07 DIAGNOSIS — N1330 Unspecified hydronephrosis: Secondary | ICD-10-CM | POA: Diagnosis not present

## 2012-07-07 DIAGNOSIS — I5033 Acute on chronic diastolic (congestive) heart failure: Secondary | ICD-10-CM | POA: Diagnosis not present

## 2012-07-07 DIAGNOSIS — R0989 Other specified symptoms and signs involving the circulatory and respiratory systems: Secondary | ICD-10-CM | POA: Diagnosis not present

## 2012-07-07 LAB — GLUCOSE, CAPILLARY
Glucose-Capillary: 243 mg/dL — ABNORMAL HIGH (ref 70–99)
Glucose-Capillary: 281 mg/dL — ABNORMAL HIGH (ref 70–99)

## 2012-07-07 LAB — TROPONIN I: Troponin I: 1.33 ng/mL (ref <0.30)

## 2012-07-07 LAB — PROCALCITONIN: Procalcitonin: 6.99 ng/mL

## 2012-07-07 LAB — BASIC METABOLIC PANEL
BUN: 21 mg/dL (ref 6–23)
Calcium: 9.3 mg/dL (ref 8.4–10.5)
Chloride: 96 mEq/L (ref 96–112)
Creatinine, Ser: 0.72 mg/dL (ref 0.50–1.10)
GFR calc Af Amer: 85 mL/min — ABNORMAL LOW (ref >90)
GFR calc Af Amer: 83 mL/min — ABNORMAL LOW (ref >90)
GFR calc non Af Amer: 73 mL/min — ABNORMAL LOW (ref >90)
GFR calc non Af Amer: 72 mL/min — ABNORMAL LOW (ref >90)
Glucose, Bld: 240 mg/dL — ABNORMAL HIGH (ref 70–99)
Glucose, Bld: 302 mg/dL — ABNORMAL HIGH (ref 70–99)
Potassium: 3 mEq/L — ABNORMAL LOW (ref 3.5–5.1)
Potassium: 3.8 mEq/L (ref 3.5–5.1)
Sodium: 136 mEq/L (ref 135–145)

## 2012-07-07 LAB — CBC
Hemoglobin: 12.5 g/dL (ref 12.0–15.0)
MCHC: 37 g/dL — ABNORMAL HIGH (ref 30.0–36.0)
RDW: 13.4 % (ref 11.5–15.5)
WBC: 19.2 10*3/uL — ABNORMAL HIGH (ref 4.0–10.5)

## 2012-07-07 LAB — MAGNESIUM: Magnesium: 1.9 mg/dL (ref 1.5–2.5)

## 2012-07-07 MED ORDER — SERTRALINE HCL 50 MG PO TABS
50.0000 mg | ORAL_TABLET | Freq: Every morning | ORAL | Status: DC
  Administered 2012-07-07 – 2012-07-11 (×5): 50 mg via ORAL
  Filled 2012-07-07 (×5): qty 1

## 2012-07-07 MED ORDER — METOCLOPRAMIDE HCL 5 MG/ML IJ SOLN
5.0000 mg | Freq: Three times a day (TID) | INTRAMUSCULAR | Status: DC | PRN
  Administered 2012-07-07 (×2): 5 mg via INTRAVENOUS
  Filled 2012-07-07 (×2): qty 1

## 2012-07-07 MED ORDER — POTASSIUM CHLORIDE CRYS ER 20 MEQ PO TBCR
40.0000 meq | EXTENDED_RELEASE_TABLET | Freq: Once | ORAL | Status: AC
  Administered 2012-07-07: 40 meq via ORAL
  Filled 2012-07-07: qty 1

## 2012-07-07 MED ORDER — SODIUM CHLORIDE 0.9 % IV SOLN
INTRAVENOUS | Status: DC
  Administered 2012-07-07: 08:00:00 via INTRAVENOUS

## 2012-07-07 MED ORDER — CARVEDILOL 3.125 MG PO TABS
3.1250 mg | ORAL_TABLET | Freq: Two times a day (BID) | ORAL | Status: DC
  Administered 2012-07-07 – 2012-07-11 (×9): 3.125 mg via ORAL
  Filled 2012-07-07 (×11): qty 1

## 2012-07-07 MED ORDER — VITAMIN D (ERGOCALCIFEROL) 1.25 MG (50000 UNIT) PO CAPS
50000.0000 [IU] | ORAL_CAPSULE | ORAL | Status: DC
  Filled 2012-07-07: qty 1

## 2012-07-07 MED ORDER — LORAZEPAM 0.5 MG PO TABS
0.5000 mg | ORAL_TABLET | Freq: Every evening | ORAL | Status: DC | PRN
  Administered 2012-07-07: 0.5 mg via ORAL
  Filled 2012-07-07: qty 1

## 2012-07-07 MED ORDER — POTASSIUM CHLORIDE CRYS ER 20 MEQ PO TBCR
20.0000 meq | EXTENDED_RELEASE_TABLET | Freq: Once | ORAL | Status: AC
  Administered 2012-07-07: 20 meq via ORAL

## 2012-07-07 MED ORDER — ATORVASTATIN CALCIUM 10 MG PO TABS
10.0000 mg | ORAL_TABLET | ORAL | Status: DC
  Administered 2012-07-09 – 2012-07-10 (×2): 10 mg via ORAL
  Filled 2012-07-07 (×3): qty 1

## 2012-07-07 MED ORDER — APIXABAN 2.5 MG PO TABS
2.5000 mg | ORAL_TABLET | Freq: Two times a day (BID) | ORAL | Status: DC
  Administered 2012-07-07 – 2012-07-11 (×9): 2.5 mg via ORAL
  Filled 2012-07-07 (×10): qty 1

## 2012-07-07 MED ORDER — FUROSEMIDE 10 MG/ML IJ SOLN
40.0000 mg | Freq: Once | INTRAMUSCULAR | Status: AC
  Administered 2012-07-07: 40 mg via INTRAVENOUS
  Filled 2012-07-07: qty 4

## 2012-07-07 MED ORDER — DILTIAZEM HCL ER COATED BEADS 180 MG PO CP24
180.0000 mg | ORAL_CAPSULE | Freq: Every day | ORAL | Status: DC
  Administered 2012-07-07 – 2012-07-08 (×2): 180 mg via ORAL
  Filled 2012-07-07 (×2): qty 1

## 2012-07-07 MED ORDER — ONDANSETRON HCL 4 MG/2ML IJ SOLN
4.0000 mg | Freq: Once | INTRAMUSCULAR | Status: AC
  Administered 2012-07-07: 4 mg via INTRAVENOUS
  Filled 2012-07-07: qty 2

## 2012-07-07 MED ORDER — SENNOSIDES-DOCUSATE SODIUM 8.6-50 MG PO TABS
2.0000 | ORAL_TABLET | Freq: Two times a day (BID) | ORAL | Status: DC
  Administered 2012-07-07 – 2012-07-11 (×8): 2 via ORAL
  Filled 2012-07-07 (×10): qty 2

## 2012-07-07 NOTE — Progress Notes (Signed)
Patient ID: Jo [name], female   DOB: [birth year], 79 y.o.   MRN: [id number]    SUBJECTIVE: Patient is back in NSR this morning with stable BP and HR.  She diuresed well with IV Lasix yesterday.    Marland Kitchen apixaban  2.5 mg Oral BID  . [START ON 07/09/2012] atorvastatin  10 mg Oral Custom  . carvedilol  3.125 mg Oral BID WC  . diltiazem  180 mg Oral Daily  . flecainide  50 mg Oral Q12H  . furosemide  40 mg Intravenous Once  . insulin aspart  0-9 Units Subcutaneous Q4H  . ondansetron  4 mg Intravenous Once  . piperacillin-tazobactam (ZOSYN)  IV  3.375 g Intravenous Q8H  . potassium chloride  40 mEq Oral Once  . sertraline  50 mg Oral q morning - 10a  . [START ON 07/11/2012] Vitamin D (Ergocalciferol)  50,000 Units Oral Q Wed      Filed Vitals:   07/07/12 0700 07/07/12 0738 07/07/12 0800 07/07/12 1000  BP: 152/93  172/93 127/47  Pulse: 70  70 67  Temp:  97.8 F (36.6 C)    TempSrc:  Oral    Resp: 20  23 25   Weight:      SpO2: 99%  100% 97%    Intake/Output Summary (Last 24 hours) at 07/07/12 1146 Last data filed at 07/07/12 1100  Gross per 24 hour  Intake 1451.66 ml  Output   4510 ml  Net -3058.34 ml    LABS: Basic Metabolic Panel:  Recent Labs  21/30/86 2210 07/07/12 0340 07/07/12 0810  NA 136 136  --   K 2.9* 3.0*  --   CL 95* 96  --   CO2 27 28  --   GLUCOSE 283* 240*  --   BUN 16 17  --   CREATININE 0.71 0.67  --   CALCIUM 9.0 8.8  --   MG  --   --  1.9   Liver Function Tests:  Recent Labs  07/06/12 1028  AST 28  ALT 52*  ALKPHOS 95  BILITOT 1.0  PROT 6.9  ALBUMIN 3.5    Recent Labs  07/06/12 1028  LIPASE 31   CBC:  Recent Labs  07/06/12 1028 07/07/12 0340  WBC 13.1* 19.2*  NEUTROABS 12.1*  --   HGB 12.4 12.5  HCT 35.1* 33.8*  MCV 90.5 88.5  PLT 103* 115*   Cardiac Enzymes:  Recent Labs  07/06/12 1028 07/07/12 0810  TROPONINI <0.30 1.33*   BNP: No components found with this basename: POCBNP,  D-Dimer: No results found for  this basename: DDIMER,  in the last 72 hours Hemoglobin A1C: No results found for this basename: HGBA1C,  in the last 72 hours Fasting Lipid Panel: No results found for this basename: CHOL, HDL, LDLCALC, TRIG, CHOLHDL, LDLDIRECT,  in the last 72 hours Thyroid Function Tests: No results found for this basename: TSH, T4TOTAL, FREET3, T3FREE, THYROIDAB,  in the last 72 hours Anemia Panel: No results found for this basename: VITAMINB12, FOLATE, FERRITIN, TIBC, IRON, RETICCTPCT,  in the last 72 hours  RADIOLOGY: Ct Angio Chest Pe W/cm &/or Wo Cm  07/06/2012   *RADIOLOGY REPORT*  Clinical Data:  Shortness of breath, abdominal pain, nausea.  CT ANGIOGRAPHY CHEST CT ABDOMEN AND PELVIS WITH CONTRAST  Technique:  Multidetector CT imaging of the chest was performed using the standard protocol during bolus administration of intravenous contrast.  Multiplanar CT image reconstructions including MIPs were obtained to evaluate the  vascular anatomy. Multidetector CT imaging of the abdomen and pelvis was performed using the standard protocol during bolus administration of intravenous contrast.  Contrast: OMNIPAQUE IOHEXOL 350 MG/ML SOLN  Comparison:  CT of the pelvis of 02/12/2007.  CTA CHEST  Findings:  Mediastinum: Study is slightly limited by respiratory motion.  With these limitations in mind, there is no evidence of clinically relevant central, lobar or segmental sized pulmonary embolism. Unfortunately, smaller subsegmental sized pulmonary emboli cannot be accurately excluded on this examination. Heart size is borderline enlarged. There is no significant pericardial fluid, thickening or pericardial calcification.  Left-sided pacemaker device in place with lead tips terminating in the right atrial appendage and the right ventricular apex.  A small hiatal hernia. The No pathologically enlarged mediastinal or hilar lymph nodes. There is atherosclerosis of the thoracic aorta, the great vessels of the mediastinum  and the coronary arteries, including calcified atherosclerotic plaque in the left anterior descending and right coronary arteries.  Lungs/Pleura: Trace right and small left pleural effusions are simple in appearance layering dependently.  A small area of consolidative air space disease in the posterior aspect of the right lower lobe.  Multifocal other areas of ground-glass attenuation and extensive interlobular septal thickening noted throughout the lungs bilaterally, as well as peribronchovascular interstitial thickening, favored to represent a manifestation of pulmonary edema.  Laxity of the trachea and mainstem bronchi bilaterally which appears to change with respiration, compatible with tracheobronchial malacia.  Musculoskeletal: There are no aggressive appearing lytic or blastic lesions noted in the visualized portions of the skeleton.   Review of the MIP images confirms the above findings.  IMPRESSION:  1.  Despite the limitations of this examination, there is no evidence to suggest clinically relevant central, lobar or segmental sized pulmonary embolism. 2.  The appearance of the chest is most compatible with mild congestive heart failure. 3.  More confluent consolidative airspace disease in the posterior aspect of the right lower lobe Comins reflects sequelae of mild aspiration or a developing site of infection. 4. Atherosclerosis, including two-vessel coronary artery disease. 5.  Tracheobronchial malacia. 6.  Additional incidental findings, as above.  CT ABDOMEN AND PELVIS  Findings:  Abdomen/Pelvis:  Image 58 of series 8 demonstrates a 7 mm calculus in the distal third of the left ureter shortly before the left ureterovesicular junction.  This is associated with mild to moderate left-sided hydroureteronephrosis extensive perinephric stranding, compatible with obstruction.  The extrarenal pelvis is noted on the right side (normal anatomical variant).  5.1 x 3.4 x 4.0 cm mass in the right adrenal gland has  internal fatty attenuation, most compatible with a large adrenal myelolipoma. There is also a 3.2 x 2.5 x 2.6 cm left adrenal mass which is indeterminate.  This mass measures 94 HU on the portal venous phase imaging, and 58 HU on the 3-minute post contrast delayed imaging, for a relative washout of approximately 39.4% (lesions with relative washout of less than 40% are considered indeterminate, however, that is based on a 15-minute delay).  A subcentimeter low attenuation lesion in segment three of the liver is too small to definitively characterize, but statistically likely a small cyst. Minimal intrahepatic biliary ductal dilatation.  Common bile duct is normal in caliber.  The appearance of the gallbladder and spleen is unremarkable.  A few pancreatic calcifications Lagace be related to prior episodes of pancreatitis.  Small volume of perihepatic ascites.  No pneumoperitoneum.  No pathologic distension of small bowel.  Numerous reactive sized retroperitoneal lymph  nodes are noted, but no definite pathologically enlarged abdominal or pelvic lymph nodes are identified on today's examination.  Uterus and ovaries are atrophic, but otherwise unremarkable in appearance.  Musculoskeletal: Old healed fractures of the parasymphyseal aspect of the right pelvis involving both superior and inferior pubic rami are noted. There are no aggressive appearing lytic or blastic lesions noted in the visualized portions of the skeleton.  Review of the MIP images confirms the above findings.  IMPRESSION: 1.  7 mm partially obstructive calculus in the distal third of the left ureter with mild to moderate left-sided hydroureteronephrosis and extensive left-sided perinephric stranding.  2.  5.1 x 3.4 x 4.0 cm fatty attenuation mass in the right adrenal gland is compatible with a large myelolipoma. 3.  In addition, there is a 2.5 x 3.2 x 2.6 cm mass in the left adrenal gland which by washout characteristics almost certainly represents an  adenoma. If for some clinical reason there is concern for metastatic lesion or primary adrenal malignancy, correlation with noncontrast CT of the abdomen could be obtained (the patient has a pacemaker device and is not a candidate for MRI examination). 4.  Atherosclerosis. 5.  Minimal intrahepatic biliary ductal dilatation without evidence of common bile duct or pancreatic duct dilatation.  This finding is of uncertain etiology and significance, but Steyer be chronic in this patient.  Correlation with liver function tests is suggested. 6.  Small volume of perihepatic ascites.   Original Report Authenticated By: Trudie Reed, M.D.   Ct Abdomen Pelvis W Contrast  07/06/2012   *RADIOLOGY REPORT*  Clinical Data:  Shortness of breath, abdominal pain, nausea.  CT ANGIOGRAPHY CHEST CT ABDOMEN AND PELVIS WITH CONTRAST  Technique:  Multidetector CT imaging of the chest was performed using the standard protocol during bolus administration of intravenous contrast.  Multiplanar CT image reconstructions including MIPs were obtained to evaluate the vascular anatomy. Multidetector CT imaging of the abdomen and pelvis was performed using the standard protocol during bolus administration of intravenous contrast.  Contrast: OMNIPAQUE IOHEXOL 350 MG/ML SOLN  Comparison:  CT of the pelvis of 02/12/2007.  CTA CHEST  Findings:  Mediastinum: Study is slightly limited by respiratory motion.  With these limitations in mind, there is no evidence of clinically relevant central, lobar or segmental sized pulmonary embolism. Unfortunately, smaller subsegmental sized pulmonary emboli cannot be accurately excluded on this examination. Heart size is borderline enlarged. There is no significant pericardial fluid, thickening or pericardial calcification.  Left-sided pacemaker device in place with lead tips terminating in the right atrial appendage and the right ventricular apex.  A small hiatal hernia. The No pathologically enlarged  mediastinal or hilar lymph nodes. There is atherosclerosis of the thoracic aorta, the great vessels of the mediastinum and the coronary arteries, including calcified atherosclerotic plaque in the left anterior descending and right coronary arteries.  Lungs/Pleura: Trace right and small left pleural effusions are simple in appearance layering dependently.  A small area of consolidative air space disease in the posterior aspect of the right lower lobe.  Multifocal other areas of ground-glass attenuation and extensive interlobular septal thickening noted throughout the lungs bilaterally, as well as peribronchovascular interstitial thickening, favored to represent a manifestation of pulmonary edema.  Laxity of the trachea and mainstem bronchi bilaterally which appears to change with respiration, compatible with tracheobronchial malacia.  Musculoskeletal: There are no aggressive appearing lytic or blastic lesions noted in the visualized portions of the skeleton.   Review of the MIP images confirms the  above findings.  IMPRESSION:  1.  Despite the limitations of this examination, there is no evidence to suggest clinically relevant central, lobar or segmental sized pulmonary embolism. 2.  The appearance of the chest is most compatible with mild congestive heart failure. 3.  More confluent consolidative airspace disease in the posterior aspect of the right lower lobe Furuya reflects sequelae of mild aspiration or a developing site of infection. 4. Atherosclerosis, including two-vessel coronary artery disease. 5.  Tracheobronchial malacia. 6.  Additional incidental findings, as above.  CT ABDOMEN AND PELVIS  Findings:  Abdomen/Pelvis:  Image 58 of series 8 demonstrates a 7 mm calculus in the distal third of the left ureter shortly before the left ureterovesicular junction.  This is associated with mild to moderate left-sided hydroureteronephrosis extensive perinephric stranding, compatible with obstruction.  The extrarenal  pelvis is noted on the right side (normal anatomical variant).  5.1 x 3.4 x 4.0 cm mass in the right adrenal gland has internal fatty attenuation, most compatible with a large adrenal myelolipoma. There is also a 3.2 x 2.5 x 2.6 cm left adrenal mass which is indeterminate.  This mass measures 94 HU on the portal venous phase imaging, and 58 HU on the 3-minute post contrast delayed imaging, for a relative washout of approximately 39.4% (lesions with relative washout of less than 40% are considered indeterminate, however, that is based on a 15-minute delay).  A subcentimeter low attenuation lesion in segment three of the liver is too small to definitively characterize, but statistically likely a small cyst. Minimal intrahepatic biliary ductal dilatation.  Common bile duct is normal in caliber.  The appearance of the gallbladder and spleen is unremarkable.  A few pancreatic calcifications Mawson be related to prior episodes of pancreatitis.  Small volume of perihepatic ascites.  No pneumoperitoneum.  No pathologic distension of small bowel.  Numerous reactive sized retroperitoneal lymph nodes are noted, but no definite pathologically enlarged abdominal or pelvic lymph nodes are identified on today's examination.  Uterus and ovaries are atrophic, but otherwise unremarkable in appearance.  Musculoskeletal: Old healed fractures of the parasymphyseal aspect of the right pelvis involving both superior and inferior pubic rami are noted. There are no aggressive appearing lytic or blastic lesions noted in the visualized portions of the skeleton.  Review of the MIP images confirms the above findings.  IMPRESSION: 1.  7 mm partially obstructive calculus in the distal third of the left ureter with mild to moderate left-sided hydroureteronephrosis and extensive left-sided perinephric stranding.  2.  5.1 x 3.4 x 4.0 cm fatty attenuation mass in the right adrenal gland is compatible with a large myelolipoma. 3.  In addition, there is a  2.5 x 3.2 x 2.6 cm mass in the left adrenal gland which by washout characteristics almost certainly represents an adenoma. If for some clinical reason there is concern for metastatic lesion or primary adrenal malignancy, correlation with noncontrast CT of the abdomen could be obtained (the patient has a pacemaker device and is not a candidate for MRI examination). 4.  Atherosclerosis. 5.  Minimal intrahepatic biliary ductal dilatation without evidence of common bile duct or pancreatic duct dilatation.  This finding is of uncertain etiology and significance, but Kilcrease be chronic in this patient.  Correlation with liver function tests is suggested. 6.  Small volume of perihepatic ascites.   Original Report Authenticated By: Trudie Reed, M.D.   PHYSICAL EXAM General: NAD Neck: JVP 9-10 cm, no thyromegaly or thyroid nodule.  Lungs: Decreased breath sounds  right base. CV: Nondisplaced PMI.  Heart regular S1/S2, no S3/S4, no murmur.  No peripheral edema.  No carotid bruit.  Normal pedal pulses.  Abdomen: Soft, nontender, no hepatosplenomegaly, no distention.  Neurologic: Alert and oriented x 3.  Psych: Normal affect. Extremities: No clubbing or cyanosis.   TELEMETRY: Reviewed telemetry pt in NSR  ASSESSMENT AND PLAN: 79 yo with history of PAF on flecainide, HTN, and diastolic CHF presented with RLL PNA and possible UTI along with atrial fibrillation/RVR and diastolic CHF exacerbation.  1. Acute/chronic diastolic CHF: In setting of atrial fibrillation with RVR.  She diuresed well yesterday, some residual volume overload.  - Replete K and give Lasix 40 mg IV x 1 today.  2. Atrial fibrillation: Now back in NSR.   - Continue flecainide to maintain NSR and Coreg. - Transition off IV diltiazem onto po diltiazem CD at home dose.  - Continue apixaban.  3. ID: UTI, possible RLL PNA.  GNRs on blood cultures.  On antibiotics per CCM.   Marca Ancona 07/07/2012 11:49 AM

## 2012-07-07 NOTE — H&P (Signed)
PULMONARY  / CRITICAL CARE MEDICINE  Name: Jo [name] MRN: [id number] DOB: [birth year]    ADMISSION DATE:  07/06/2012  REFERRING MD :  EDP PRIMARY SERVICE: PCCM  CHIEF COMPLAINT:  Hypotension    BRIEF:  79 y/o F with a PMH of HTN, PAF s/p pacemaker on Eliquis, DM (diet controlled) who presented to [facility] ER on 6/13 with four day history of abdominal pain & nausea.  While in ER she was noted to have room air saturations in 80's and tachypnea.  CTA Chest negative for PE, suggestive of mild CHF, and RLL consolidative disease concerning for infection.  Abd CT demonstrated L renal obstructing calculi with hydronephrosis, extensive perinephric stranding.  Lactic acid 1.6.  Later in ER course, she had Afib with RVR and Rx'd with Cardizem / lasix, subsequent hypotension.  UA positive for nitrites, many bacteria, 0-2 WBC, greater than 1000k glucose.  PCCM consulted for SIRS / Hypotension.   Patient denies shortness of breath, abd pain, nausea, pain with urination, abnormal BM, chest pain, chest pain with inspiration. Last BM 6/12    LINES / TUBES:   CULTURES: UA 6/13>>>pos nitrite, 0-2 wbc, many bacteria, >1000k glucose BCx2 6/13>>> GNR in BOTH BOTTLES UC 6/13>>>     ANTIBIOTICS: Zosyn 6/13 (urinary / obs & asp)>>>  SIGNIFICANT EVENTS / STUDIES:  6/13 - Admit with obstructing renal stone, hydro.  AFib with RVR, Rx'd then hypotensive.     SUBJECTIVE/OVERNIGHT/INTERVAL HX 6/143/14:  Overnight no events. BP stable.Not on pressors. Not intubated. Getting IVF with KCL. On cardizem gtt, and oral flecanide but currently not on her home Coreg  VITAL SIGNS: Temp:  [97.5 F (36.4 C)-98.5 F (36.9 C)] 97.8 F (36.6 C) (06/14 0738) Pulse Rate:  [67-144] 70 (06/14 0700) Resp:  [14-33] 20 (06/14 0700) BP: (88-218)/(46-108) 152/93 mmHg (06/14 0700) SpO2:  [89 %-100 %] 99 % (06/14 0700) FiO2 (%):  [40 %-100 %] 40 % (06/13 1700) Weight:  [58.4 kg (128 lb 12 oz)] 58.4 kg (128 lb 12 oz)  (06/13 2000)  HEMODYNAMICS:    VENTILATOR SETTINGS: Vent Mode:  [-]  FiO2 (%):  [40 %-100 %] 40 %  INTAKE / OUTPUT: Intake/Output     06/13 0701 - 06/14 0700 06/14 0701 - 06/15 0700   I.V. (mL/kg) 1125.8 (19.3)    Total Intake(mL/kg) 1125.8 (19.3)    Urine (mL/kg/hr) 4210    Total Output 4210     Net -3084.2            PHYSICAL EXAMINATION: General:  Frail elderly female in NAD N on nasal cannula looks stable  Neuro:  AAOx4, speech clear, MAE. Hard of hearing  HEENT:  Mm pink/moist Cardiovascular:  s1s2 distant, irregular Lungs:  resp's shallow, non-labored, lungs bilaterally with faint posterior crackles Abdomen:  Round, mild distention but soft, bsx4 hypoactive Musculoskeletal:  No acute deformities Skin:  Warm/dry, no edema  LABS  - see A and P  IMAGING x  48h Ct Angio Chest Pe W/cm &/or Wo Cm  07/06/2012   *RADIOLOGY REPORT*  Clinical Data:  Shortness of breath, abdominal pain, nausea.  CT ANGIOGRAPHY CHEST CT ABDOMEN AND PELVIS WITH CONTRAST  Technique:  Multidetector CT imaging of the chest was performed using the standard protocol during bolus administration of intravenous contrast.  Multiplanar CT image reconstructions including MIPs were obtained to evaluate the vascular anatomy. Multidetector CT imaging of the abdomen and pelvis was performed using the standard protocol during bolus administration of intravenous contrast.  Contrast: OMNIPAQUE IOHEXOL 350 MG/ML SOLN  Comparison:  CT of the pelvis of 02/12/2007.  CTA CHEST  Findings:  Mediastinum: Study is slightly limited by respiratory motion.  With these limitations in mind, there is no evidence of clinically relevant central, lobar or segmental sized pulmonary embolism. Unfortunately, smaller subsegmental sized pulmonary emboli cannot be accurately excluded on this examination. Heart size is borderline enlarged. There is no significant pericardial fluid, thickening or pericardial calcification.  Left-sided  pacemaker device in place with lead tips terminating in the right atrial appendage and the right ventricular apex.  A small hiatal hernia. The No pathologically enlarged mediastinal or hilar lymph nodes. There is atherosclerosis of the thoracic aorta, the great vessels of the mediastinum and the coronary arteries, including calcified atherosclerotic plaque in the left anterior descending and right coronary arteries.  Lungs/Pleura: Trace right and small left pleural effusions are simple in appearance layering dependently.  A small area of consolidative air space disease in the posterior aspect of the right lower lobe.  Multifocal other areas of ground-glass attenuation and extensive interlobular septal thickening noted throughout the lungs bilaterally, as well as peribronchovascular interstitial thickening, favored to represent a manifestation of pulmonary edema.  Laxity of the trachea and mainstem bronchi bilaterally which appears to change with respiration, compatible with tracheobronchial malacia.  Musculoskeletal: There are no aggressive appearing lytic or blastic lesions noted in the visualized portions of the skeleton.   Review of the MIP images confirms the above findings.  IMPRESSION:  1.  Despite the limitations of this examination, there is no evidence to suggest clinically relevant central, lobar or segmental sized pulmonary embolism. 2.  The appearance of the chest is most compatible with mild congestive heart failure. 3.  More confluent consolidative airspace disease in the posterior aspect of the right lower lobe Alkema reflects sequelae of mild aspiration or a developing site of infection. 4. Atherosclerosis, including two-vessel coronary artery disease. 5.  Tracheobronchial malacia. 6.  Additional incidental findings, as above.  CT ABDOMEN AND PELVIS  Findings:  Abdomen/Pelvis:  Image 58 of series 8 demonstrates a 7 mm calculus in the distal third of the left ureter shortly before the left  ureterovesicular junction.  This is associated with mild to moderate left-sided hydroureteronephrosis extensive perinephric stranding, compatible with obstruction.  The extrarenal pelvis is noted on the right side (normal anatomical variant).  5.1 x 3.4 x 4.0 cm mass in the right adrenal gland has internal fatty attenuation, most compatible with a large adrenal myelolipoma. There is also a 3.2 x 2.5 x 2.6 cm left adrenal mass which is indeterminate.  This mass measures 94 HU on the portal venous phase imaging, and 58 HU on the 3-minute post contrast delayed imaging, for a relative washout of approximately 39.4% (lesions with relative washout of less than 40% are considered indeterminate, however, that is based on a 15-minute delay).  A subcentimeter low attenuation lesion in segment three of the liver is too small to definitively characterize, but statistically likely a small cyst. Minimal intrahepatic biliary ductal dilatation.  Common bile duct is normal in caliber.  The appearance of the gallbladder and spleen is unremarkable.  A few pancreatic calcifications Drakeford be related to prior episodes of pancreatitis.  Small volume of perihepatic ascites.  No pneumoperitoneum.  No pathologic distension of small bowel.  Numerous reactive sized retroperitoneal lymph nodes are noted, but no definite pathologically enlarged abdominal or pelvic lymph nodes are identified on today's examination.  Uterus and ovaries are  atrophic, but otherwise unremarkable in appearance.  Musculoskeletal: Old healed fractures of the parasymphyseal aspect of the right pelvis involving both superior and inferior pubic rami are noted. There are no aggressive appearing lytic or blastic lesions noted in the visualized portions of the skeleton.  Review of the MIP images confirms the above findings.  IMPRESSION: 1.  7 mm partially obstructive calculus in the distal third of the left ureter with mild to moderate left-sided hydroureteronephrosis and  extensive left-sided perinephric stranding.  2.  5.1 x 3.4 x 4.0 cm fatty attenuation mass in the right adrenal gland is compatible with a large myelolipoma. 3.  In addition, there is a 2.5 x 3.2 x 2.6 cm mass in the left adrenal gland which by washout characteristics almost certainly represents an adenoma. If for some clinical reason there is concern for metastatic lesion or primary adrenal malignancy, correlation with noncontrast CT of the abdomen could be obtained (the patient has a pacemaker device and is not a candidate for MRI examination). 4.  Atherosclerosis. 5.  Minimal intrahepatic biliary ductal dilatation without evidence of common bile duct or pancreatic duct dilatation.  This finding is of uncertain etiology and significance, but Bodner be chronic in this patient.  Correlation with liver function tests is suggested. 6.  Small volume of perihepatic ascites.   Original Report Authenticated By: Trudie Reed, M.D.   Ct Abdomen Pelvis W Contrast  07/06/2012   *RADIOLOGY REPORT*  Clinical Data:  Shortness of breath, abdominal pain, nausea.  CT ANGIOGRAPHY CHEST CT ABDOMEN AND PELVIS WITH CONTRAST  Technique:  Multidetector CT imaging of the chest was performed using the standard protocol during bolus administration of intravenous contrast.  Multiplanar CT image reconstructions including MIPs were obtained to evaluate the vascular anatomy. Multidetector CT imaging of the abdomen and pelvis was performed using the standard protocol during bolus administration of intravenous contrast.  Contrast: OMNIPAQUE IOHEXOL 350 MG/ML SOLN  Comparison:  CT of the pelvis of 02/12/2007.  CTA CHEST  Findings:  Mediastinum: Study is slightly limited by respiratory motion.  With these limitations in mind, there is no evidence of clinically relevant central, lobar or segmental sized pulmonary embolism. Unfortunately, smaller subsegmental sized pulmonary emboli cannot be accurately excluded on this examination. Heart  size is borderline enlarged. There is no significant pericardial fluid, thickening or pericardial calcification.  Left-sided pacemaker device in place with lead tips terminating in the right atrial appendage and the right ventricular apex.  A small hiatal hernia. The No pathologically enlarged mediastinal or hilar lymph nodes. There is atherosclerosis of the thoracic aorta, the great vessels of the mediastinum and the coronary arteries, including calcified atherosclerotic plaque in the left anterior descending and right coronary arteries.  Lungs/Pleura: Trace right and small left pleural effusions are simple in appearance layering dependently.  A small area of consolidative air space disease in the posterior aspect of the right lower lobe.  Multifocal other areas of ground-glass attenuation and extensive interlobular septal thickening noted throughout the lungs bilaterally, as well as peribronchovascular interstitial thickening, favored to represent a manifestation of pulmonary edema.  Laxity of the trachea and mainstem bronchi bilaterally which appears to change with respiration, compatible with tracheobronchial malacia.  Musculoskeletal: There are no aggressive appearing lytic or blastic lesions noted in the visualized portions of the skeleton.   Review of the MIP images confirms the above findings.  IMPRESSION:  1.  Despite the limitations of this examination, there is no evidence to suggest clinically relevant central, lobar  or segmental sized pulmonary embolism. 2.  The appearance of the chest is most compatible with mild congestive heart failure. 3.  More confluent consolidative airspace disease in the posterior aspect of the right lower lobe Cornman reflects sequelae of mild aspiration or a developing site of infection. 4. Atherosclerosis, including two-vessel coronary artery disease. 5.  Tracheobronchial malacia. 6.  Additional incidental findings, as above.  CT ABDOMEN AND PELVIS  Findings:  Abdomen/Pelvis:   Image 58 of series 8 demonstrates a 7 mm calculus in the distal third of the left ureter shortly before the left ureterovesicular junction.  This is associated with mild to moderate left-sided hydroureteronephrosis extensive perinephric stranding, compatible with obstruction.  The extrarenal pelvis is noted on the right side (normal anatomical variant).  5.1 x 3.4 x 4.0 cm mass in the right adrenal gland has internal fatty attenuation, most compatible with a large adrenal myelolipoma. There is also a 3.2 x 2.5 x 2.6 cm left adrenal mass which is indeterminate.  This mass measures 94 HU on the portal venous phase imaging, and 58 HU on the 3-minute post contrast delayed imaging, for a relative washout of approximately 39.4% (lesions with relative washout of less than 40% are considered indeterminate, however, that is based on a 15-minute delay).  A subcentimeter low attenuation lesion in segment three of the liver is too small to definitively characterize, but statistically likely a small cyst. Minimal intrahepatic biliary ductal dilatation.  Common bile duct is normal in caliber.  The appearance of the gallbladder and spleen is unremarkable.  A few pancreatic calcifications Pehrson be related to prior episodes of pancreatitis.  Small volume of perihepatic ascites.  No pneumoperitoneum.  No pathologic distension of small bowel.  Numerous reactive sized retroperitoneal lymph nodes are noted, but no definite pathologically enlarged abdominal or pelvic lymph nodes are identified on today's examination.  Uterus and ovaries are atrophic, but otherwise unremarkable in appearance.  Musculoskeletal: Old healed fractures of the parasymphyseal aspect of the right pelvis involving both superior and inferior pubic rami are noted. There are no aggressive appearing lytic or blastic lesions noted in the visualized portions of the skeleton.  Review of the MIP images confirms the above findings.  IMPRESSION: 1.  7 mm partially obstructive  calculus in the distal third of the left ureter with mild to moderate left-sided hydroureteronephrosis and extensive left-sided perinephric stranding.  2.  5.1 x 3.4 x 4.0 cm fatty attenuation mass in the right adrenal gland is compatible with a large myelolipoma. 3.  In addition, there is a 2.5 x 3.2 x 2.6 cm mass in the left adrenal gland which by washout characteristics almost certainly represents an adenoma. If for some clinical reason there is concern for metastatic lesion or primary adrenal malignancy, correlation with noncontrast CT of the abdomen could be obtained (the patient has a pacemaker device and is not a candidate for MRI examination). 4.  Atherosclerosis. 5.  Minimal intrahepatic biliary ductal dilatation without evidence of common bile duct or pancreatic duct dilatation.  This finding is of uncertain etiology and significance, but Hornberger be chronic in this patient.  Correlation with liver function tests is suggested. 6.  Small volume of perihepatic ascites.   Original Report Authenticated By: Trudie Reed, M.D.   US Renal Port  07/06/2012   *RADIOLOGY REPORT*  Clinical Data: Left ureteral calculus causing mild to moderate left hydronephrosis on an abdomen and pelvis CT earlier today.  RENAL/URINARY TRACT ULTRASOUND COMPLETE  Comparison:  Abdomen pelvis CT obtained  earlier today.  Findings:  Right Kidney:  Normal, measuring 10.9 cm in length.  Previously demonstrated prominent extrarenal pelvis.  Left Kidney:  Mild to moderate dilatation of the left renal collecting system with possible mild improvement.  Otherwise, normal with fetal lobulations, measuring 11.9 cm in length.  Bladder:  Nondistended with a Foley catheter in place.  Multiple gallstones are noted in the gallbladder measuring up to 1.8 cm in maximum diameter each.  Unable to determine if the patient was focally tender over the gallbladder due to medication. No gallbladder wall thickening or pericholecystic fluid.  Normal caliber  common duct measuring 3.9 mm in diameter proximally.  Poorly defined rounded echogenic mass superior to the right kidney, corresponding to the large adrenal myelolipoma seen on the CT.  IMPRESSION:  1.  Mild to moderate left hydronephrosis with possible mild improvement (difficult to directly compare different modalities). 2.  Cholelithiasis without evidence of cholecystitis. 3.  Previously demonstrated large right adrenal myelolipoma.   Original Report Authenticated By: Beckie Salts, M.D.   Dg Chest Port 1 View  07/07/2012   *RADIOLOGY REPORT*  Clinical Data: Evaluate for pulmonary edema  PORTABLE CHEST - 1 VIEW  Comparison: Recent chest CT 07/06/2012; prior chest x-ray 06/04/2012  Findings: Stable position of left subclavian approach cardiac rhythm maintenance device with leads projecting over the right atrium and right ventricle.  Inspiratory volumes are slightly low and there is trace bibasilar subsegmental atelectasis.  Mild pulmonary vascular congestion without overt edema.  Stable cardiac and mediastinal contours with enlargement of the bilateral main pulmonary arteries.  Atherosclerotic calcifications noted in the transverse aorta.  No acute osseous abnormality.  IMPRESSION:  1.  Decreased inspiratory volumes with mild bibasilar atelectasis. 2.  Mild pulmonary vascular congestion without overt edema   Original Report Authenticated By: Malachy Moan, M.D.   Dg Abd Acute W/chest  07/06/2012   *RADIOLOGY REPORT*  Clinical Data: Abdominal pain  ACUTE ABDOMEN SERIES (ABDOMEN 2 VIEW & CHEST 1 VIEW)  Comparison: Chest 06/04/2012  Findings: Increased lung markings diffusely and bilaterally compared to the prior study.  This Gullion be due to heart failure and vascular congestion.  Acute pulmonary infection diffusely is also possible.  Negative for lumbar infiltrate.  Pacemaker in good position.  No effusion.  Abdomen:  Nonobstructive bowel gas pattern.  No free air.  No dilated bowel loops.  No renal calculi.   IMPRESSION: Prominent lung markings diffusely and bilaterally suggestive of fluid overload however diffuse infection also possible  Nonobstructive bowel gas pattern.   Original Report Authenticated By: Janeece Riggers, M.D.    ASSESSMENT / PLAN:  PULMONARY  Recent Labs Lab 07/06/12 1311  PHART 7.379  PCO2ART 42.5  PO2ART 60.0*  HCO3 25.1*  TCO2 26  O2SAT 90.0    A: Hypoxic Respiratory Failure - neg CTA for PE.  Minimal volume up.  Hx of living with smoker.  Questionable RLL Consolidation - noted on CT Trace Bilateral Pleural Effusions   -07/07/2012: Maintaining respiratory status on nasal cannula  P:   -oxygen to support sats 90-95%   CARDIOVASCULAR  Recent Labs Lab 07/06/12 1155  PROBNP 2584.0*     Recent Labs Lab 07/06/12 1028  TROPONINI <0.30    A:  Atrial Fibrillation w RVR- hx of, s/p pacemaker on flecanide Hypotension - likely med related / volume with presumed urosepsis.  Hx of florinef use with PCP [facility] - 07/07/2012: Heart rate controlled at 69 on Cardizem drip. But blood pressure is high at 172 systolic  P:  -Cardiology Consult -y continue cardizem gtt for now -Restart home Coreg per cardiology consultation - Restart home Eliquis per cardiology consultation - Cut down IV fluids at [facility]; might need Lasix gingerly depending on course  RENAL  Recent Labs Lab 07/06/12 1028 07/06/12 2210 07/07/12 0340  NA 135 136 136  K 3.0* 2.9* 3.0*  CL 98 95* 96  CO2 26 27 28   GLUCOSE 237* 283* 240*  BUN 21 16 17   CREATININE 0.86 0.71 0.67  CALCIUM 9.2 9.0 8.8   Intake/Output     06/13 0701 - 06/14 0700 06/14 0701 - 06/15 0700   I.V. (mL/kg) 1125.8 (19.3)    Total Intake(mL/kg) 1125.8 (19.3)    Urine (mL/kg/hr) 4210    Total Output 4210     Net -3084.2            A:   L Renal Calclui - with associated hydronephrosis At Risk ATN - s/p contrast dye for CTA chest / abd.  Received 500 ml NS in ER Hypokalemia   -  07/07/2012: Making urine and  creatinine stable  P:   -Continue KVO fluids  -Urology called per ER & recommends hold off on nephrostomy unless worsening of hydronephrosis -consider repeat renal US in am and IR consult for drain if worsening of hyrdo   GASTROINTESTINAL  Recent Labs Lab 07/06/12 1028  AST 28  ALT 52*  ALKPHOS 95  BILITOT 1.0  PROT 6.9  ALBUMIN 3.5  INR 1.41    A:  Nutrion. P:   -clear liquid diet and advance as tolerated under nursing supervision   HEMATOLOGIC  Recent Labs Lab 07/06/12 1028 07/07/12 0340  HGB 12.4 12.5  HCT 35.1* 33.8*  WBC 13.1* 19.2*  PLT 103* 115*    Recent Labs Lab 07/06/12 1028  INR 1.41    A:   Thrombocytopenia - no evidence of acute bleeding  P:  -monitor platelets, concern for urosepsis  INFECTIOUS  Recent Labs Lab 07/06/12 1049  LATICACIDVEN 1.60   No results found for this basename: PROCALCITON,  in the last 168 hours  Recent Labs Lab 07/06/12 1028 07/07/12 0340  WBC 13.1* 19.2*    A:   L Obstructing Renal Calculi RLL Consolidative Airspace Disease   - 07/07/2012: Afebrile and not on vasopressors but white count is up. On vancomycin [received one dose in the emergency room) and Zosyn. Growing gram-negative rods and blood culture  P:   -treat empirically for urine & pulmonary source infection -assess PCT algorithm - Continue Zosyn  ENDOCRINE CBG (last 3)   Recent Labs  07/06/12 2025 07/06/12 2355 07/07/12 0409  GLUCAP 310* 281* 243*     A:   Diet Controlled DM Hyperglycemia  Vitamin D deficiency    P:   -SSI - Home megadose vitamin D. once every week on Wednesdays   NEUROLOGIC A:   Depression / Anxiety   P: -restart zoloft,  in am 6/14   - Depending on course we'll start home lorazepam as needed -PRN tylenol for pain   Dr. Kalman Shan, M.D., [facility].C.P Pulmonary and Critical Care Medicine Staff Physician [facility] Pager: [phone], If no answer  or between  15:00h - 7:00h: call [phone]  07/07/2012 8:16 AM

## 2012-07-07 NOTE — Progress Notes (Signed)
Having consitpatin and nausea  reglan prn zofran prn Senna-docuate  Keep in ICU one more day due to fraility, high nursing care   Dr. Kalman Shan, M.D., [facility].C.P Pulmonary and Critical Care Medicine Staff Physician [facility] [place] [facility] Pager: [phone], If no answer or between  15:00h - 7:00h: call [phone]  07/07/2012 2:16 PM

## 2012-07-07 NOTE — Consult Note (Addendum)
Urology Consult  Referring physician: Dr. Coralyn Helling and Glynn Octave Reason for referral: Mild hydronephrosis, left distal ureteral calculus  Chief Complaint: Abdominal pain  History of Present Illness: Patient is a 79 years old female who presented to the emergency room with a 4 days history of lower abdominal pain and nausea. She also had had decreased oral intake. CT scan of the abdomen and pelvis  showed bilateral adrenal adenomas, left perinephric stranding, mild left hydronephrosis and a 7 mm stone in the left distal ureter. She does not have a history of kidney stone. She has a history of atrial fibrillation and is on Eliquis. CT scan of the chest showed a findings consistent with pneumonia. Urinalysis shows many bacteria, 0-2 WBCs and is nitrite positive. BUN is 17. Creatinine 0.67. Past Medical History  Diagnosis Date  . History of aortic insufficiency   . Anxiety   . Hypertension   . Pacemaker   . PAF (paroxysmal atrial fibrillation)     a. s/p pacemaker. b. s/p [employer] gen change 07/2011.  . SSS (sick sinus syndrome)   . Type II diabetes mellitus     "controlled w/diet"  . Skin cancer ?1960's    "between breasts"  . Arthritis     "hands; between shoulders; back; feet"  . Panic attacks     "since losing husband 1994"  . Depression   . High risk medication use     on Flecainide  . Chronic anticoagulation   . Osteoporosis    Past Surgical History  Procedure Laterality Date  . Cesarean section  1955  . Cardiovascular stress test  12/09/2004  . Cataract extraction w/ intraocular lens  implant, bilateral  1990's  . Tonsillectomy      "school age"  . Appendectomy  1955  . Dilation and curettage of uterus  1953  . Breast cyst excision  ~ 1950    right  . Insert / replace / remove pacemaker  03/16/2006  . Skin cancer excision  ?1960's    "between my breasts"    Medications: Calcium carbonate, Cardizem, Colace, Estrace, Tambocor, Ativan, Eliquis, Coreg,  Crestor, Zoloft, vitamin D Allergies:  Allergies  Allergen Reactions  . Sulfonamide Derivatives Rash    "it was all over my legs"  . Citalopram Hydrobromide     Pt does not remember reaction    Family History  Problem Relation Age of Onset  . Emphysema Father    Social History:  reports that she has never smoked. She has never used smokeless tobacco. She reports that she does not drink alcohol or use illicit drugs.  ROS: All systems are reviewed and negative except as noted.   Physical Exam:  Vital signs in last 24 hours: Temp:  [97.5 F (36.4 C)-98 F (36.7 C)] 97.6 F (36.4 C) (06/14 1548) Pulse Rate:  [67-132] 69 (06/14 1600) Resp:  [15-27] 19 (06/14 1600) BP: (95-173)/(46-130) 159/52 mmHg (06/14 1600) SpO2:  [94 %-100 %] 97 % (06/14 1600) Weight:  [58.4 kg (128 lb 12 oz)] 58.4 kg (128 lb 12 oz) (06/13 2000) HEENT: Normal.  She is hard of hearing Cardiovascular: Skin warm; not flushed Respiratory: Breaths quiet; no shortness of breath Abdomen: No masses. No CVA tenderness. Kidneys are not palpable. Bladder is not distended. No hernias Neurological: Normal sensation to touch Musculoskeletal: Normal motor function arms and legs Lymphatics: No inguinal adenopathy Skin: No rashes Genitourinary:  Foley draining clear urine BUN: 17  Creat: 0.67 Urine culture: pending Laboratory Data:  Results  for orders placed during the hospital encounter of 07/06/12 (from the past 72 hour(s))  CBC WITH DIFFERENTIAL     Status: Abnormal   Collection Time    07/06/12 10:28 AM      Result Value Range   WBC 13.1 (*) 4.0 - 10.5 K/uL   RBC 3.88  3.87 - 5.11 MIL/uL   Hemoglobin 12.4  12.0 - 15.0 g/dL   HCT 16.1 (*) 09.6 - 04.5 %   MCV 90.5  78.0 - 100.0 fL   MCH 32.0  26.0 - 34.0 pg   MCHC 35.3  30.0 - 36.0 g/dL   RDW 40.9  81.1 - 91.4 %   Platelets 103 (*) 150 - 400 K/uL   Comment: PLATELET COUNT CONFIRMED BY SMEAR   Neutrophils Relative % 92 (*) 43 - 77 %   Neutro Abs 12.1 (*) 1.7  - 7.7 K/uL   Lymphocytes Relative 5 (*) 12 - 46 %   Lymphs Abs 0.6 (*) 0.7 - 4.0 K/uL   Monocytes Relative 3  3 - 12 %   Monocytes Absolute 0.4  0.1 - 1.0 K/uL   Eosinophils Relative 0  0 - 5 %   Eosinophils Absolute 0.0  0.0 - 0.7 K/uL   Basophils Relative 0  0 - 1 %   Basophils Absolute 0.0  0.0 - 0.1 K/uL  COMPREHENSIVE METABOLIC PANEL     Status: Abnormal   Collection Time    07/06/12 10:28 AM      Result Value Range   Sodium 135  135 - 145 mEq/L   Potassium 3.0 (*) 3.5 - 5.1 mEq/L   Chloride 98  96 - 112 mEq/L   CO2 26  19 - 32 mEq/L   Glucose, Bld 237 (*) 70 - 99 mg/dL   BUN 21  6 - 23 mg/dL   Creatinine, Ser 7.82  0.50 - 1.10 mg/dL   Calcium 9.2  8.4 - 95.6 mg/dL   Total Protein 6.9  6.0 - 8.3 g/dL   Albumin 3.5  3.5 - 5.2 g/dL   AST 28  0 - 37 U/L   ALT 52 (*) 0 - 35 U/L   Alkaline Phosphatase 95  39 - 117 U/L   Total Bilirubin 1.0  0.3 - 1.2 mg/dL   GFR calc non Af Amer 56 (*) >90 mL/min   GFR calc Af Amer 65 (*) >90 mL/min   Comment:            The eGFR has been calculated     using the CKD EPI equation.     This calculation has not been     validated in all clinical     situations.     eGFR's persistently     <90 mL/min signify     possible Chronic Kidney Disease.  LIPASE, BLOOD     Status: None   Collection Time    07/06/12 10:28 AM      Result Value Range   Lipase 31  11 - 59 U/L  TROPONIN I     Status: None   Collection Time    07/06/12 10:28 AM      Result Value Range   Troponin I <0.30  <0.30 ng/mL   Comment:            Due to the release kinetics of cTnI,     a negative result within the first hours     of the onset of symptoms does not rule out  myocardial infarction with certainty.     If myocardial infarction is still suspected,     repeat the test at appropriate intervals.  PROTIME-INR     Status: Abnormal   Collection Time    07/06/12 10:28 AM      Result Value Range   Prothrombin Time 16.9 (*) 11.6 - 15.2 seconds   INR 1.41  0.00 -  1.49  CG4 I-STAT (LACTIC ACID)     Status: None   Collection Time    07/06/12 10:49 AM      Result Value Range   Lactic Acid, Venous 1.60  0.5 - 2.2 mmol/L  OCCULT BLOOD, POC DEVICE     Status: None   Collection Time    07/06/12 10:50 AM      Result Value Range   Fecal Occult Bld NEGATIVE  NEGATIVE  URINALYSIS, ROUTINE W REFLEX MICROSCOPIC     Status: Abnormal   Collection Time    07/06/12 11:30 AM      Result Value Range   Color, Urine YELLOW  YELLOW   APPearance CLOUDY (*) CLEAR   Specific Gravity, Urine 1.014  1.005 - 1.030   pH 6.5  5.0 - 8.0   Glucose, UA >1000 (*) NEGATIVE mg/dL   Hgb urine dipstick MODERATE (*) NEGATIVE   Bilirubin Urine NEGATIVE  NEGATIVE   Ketones, ur 15 (*) NEGATIVE mg/dL   Protein, ur 30 (*) NEGATIVE mg/dL   Urobilinogen, UA 1.0  0.0 - 1.0 mg/dL   Nitrite POSITIVE (*) NEGATIVE   Leukocytes, UA NEGATIVE  NEGATIVE  URINE MICROSCOPIC-ADD ON     Status: Abnormal   Collection Time    07/06/12 11:30 AM      Result Value Range   Squamous Epithelial / LPF RARE  RARE   WBC, UA 0-2  <3 WBC/hpf   RBC / HPF 0-2  <3 RBC/hpf   Bacteria, UA MANY (*) RARE  CULTURE, BLOOD (ROUTINE X 2)     Status: None   Collection Time    07/06/12 11:55 AM      Result Value Range   Specimen Description BLOOD HAND RIGHT     Special Requests BOTTLES DRAWN AEROBIC ONLY     Culture  Setup Time 07/06/2012 16:06     Culture       Value: GRAM NEGATIVE RODS     [id number] Note: Gram Stain Report Called to,Read Back By and Verified With: CYRIL SORCHA 0640 [facility]   Report Status PENDING    PRO B NATRIURETIC PEPTIDE     Status: Abnormal   Collection Time    07/06/12 11:55 AM      Result Value Range   Pro B Natriuretic peptide (BNP) 2584.0 (*) 0 - 450 pg/mL  CULTURE, BLOOD (ROUTINE X 2)     Status: None   Collection Time    07/06/12 12:00 PM      Result Value Range   Specimen Description BLOOD ARM LEFT     Special Requests BOTTLES DRAWN AEROBIC AND ANAEROBIC 10CC     Culture   Setup Time 07/06/2012 16:06     Culture       Value: GRAM NEGATIVE RODS     [id number] Note: Gram Stain Report Called to,Read Back By and Verified With: CYRIL SORCHA 0640 [facility]   Report Status PENDING    POCT I-STAT 3, BLOOD GAS (G3+)     Status: Abnormal   Collection Time    07/06/12  1:11 PM  Result Value Range   pH, Arterial 7.379  7.350 - 7.450   pCO2 arterial 42.5  35.0 - 45.0 mmHg   pO2, Arterial 60.0 (*) 80.0 - 100.0 mmHg   Bicarbonate 25.1 (*) 20.0 - 24.0 mEq/L   TCO2 26  0 - 100 mmol/L   O2 Saturation 90.0     Patient temperature 98.6 F     Collection site RADIAL, ALLEN'S TEST ACCEPTABLE     Sample type ARTERIAL    MRSA PCR SCREENING     Status: None   Collection Time    07/06/12  8:06 PM      Result Value Range   MRSA by PCR NEGATIVE  NEGATIVE   Comment:            The GeneXpert MRSA Assay (FDA     approved for NASAL specimens     only), is one component of a     comprehensive MRSA colonization     surveillance program. It is not     intended to diagnose MRSA     infection nor to guide or     monitor treatment for     MRSA infections.  GLUCOSE, CAPILLARY     Status: Abnormal   Collection Time    07/06/12  8:25 PM      Result Value Range   Glucose-Capillary 310 (*) 70 - 99 mg/dL  BASIC METABOLIC PANEL     Status: Abnormal   Collection Time    07/06/12 10:10 PM      Result Value Range   Sodium 136  135 - 145 mEq/L   Potassium 2.9 (*) 3.5 - 5.1 mEq/L   Chloride 95 (*) 96 - 112 mEq/L   CO2 27  19 - 32 mEq/L   Glucose, Bld 283 (*) 70 - 99 mg/dL   BUN 16  6 - 23 mg/dL   Creatinine, Ser 4.54  0.50 - 1.10 mg/dL   Calcium 9.0  8.4 - 09.8 mg/dL   GFR calc non Af Amer 72 (*) >90 mL/min   GFR calc Af Amer 84 (*) >90 mL/min   Comment:            The eGFR has been calculated     using the CKD EPI equation.     This calculation has not been     validated in all clinical     situations.     eGFR's persistently     <90 mL/min signify     possible Chronic Kidney  Disease.  GLUCOSE, CAPILLARY     Status: Abnormal   Collection Time    07/06/12 11:55 PM      Result Value Range   Glucose-Capillary 281 (*) 70 - 99 mg/dL   Comment 1 Documented in Chart     Comment 2 Notify RN    CBC     Status: Abnormal   Collection Time    07/07/12  3:40 AM      Result Value Range   WBC 19.2 (*) 4.0 - 10.5 K/uL   RBC 3.82 (*) 3.87 - 5.11 MIL/uL   Hemoglobin 12.5  12.0 - 15.0 g/dL   HCT 11.9 (*) 14.7 - 82.9 %   MCV 88.5  78.0 - 100.0 fL   MCH 32.7  26.0 - 34.0 pg   MCHC 37.0 (*) 30.0 - 36.0 g/dL   RDW 56.2  13.0 - 86.5 %   Platelets 115 (*) 150 - 400 K/uL   Comment: CONSISTENT WITH  PREVIOUS RESULT  BASIC METABOLIC PANEL     Status: Abnormal   Collection Time    07/07/12  3:40 AM      Result Value Range   Sodium 136  135 - 145 mEq/L   Potassium 3.0 (*) 3.5 - 5.1 mEq/L   Chloride 96  96 - 112 mEq/L   CO2 28  19 - 32 mEq/L   Glucose, Bld 240 (*) 70 - 99 mg/dL   BUN 17  6 - 23 mg/dL   Creatinine, Ser 1.61  0.50 - 1.10 mg/dL   Calcium 8.8  8.4 - 09.6 mg/dL   GFR calc non Af Amer 73 (*) >90 mL/min   GFR calc Af Amer 85 (*) >90 mL/min   Comment:            The eGFR has been calculated     using the CKD EPI equation.     This calculation has not been     validated in all clinical     situations.     eGFR's persistently     <90 mL/min signify     possible Chronic Kidney Disease.  GLUCOSE, CAPILLARY     Status: Abnormal   Collection Time    07/07/12  4:09 AM      Result Value Range   Glucose-Capillary 243 (*) 70 - 99 mg/dL   Comment 1 Documented in Chart     Comment 2 Notify RN    TROPONIN I     Status: Abnormal   Collection Time    07/07/12  8:10 AM      Result Value Range   Troponin I 1.33 (*) <0.30 ng/mL   Comment:            Due to the release kinetics of cTnI,     a negative result within the first hours     of the onset of symptoms does not rule out     myocardial infarction with certainty.     If myocardial infarction is still suspected,      repeat the test at appropriate intervals.     CRITICAL RESULT CALLED TO, READ BACK BY AND VERIFIED WITH:     RUDIN,B RN 07/07/12 0941 WOOTEN,K  MAGNESIUM     Status: None   Collection Time    07/07/12  8:10 AM      Result Value Range   Magnesium 1.9  1.5 - 2.5 mg/dL  PROCALCITONIN     Status: None   Collection Time    07/07/12  8:40 AM      Result Value Range   Procalcitonin 6.99     Comment:            Interpretation:     PCT > 2 ng/mL:     Systemic infection (sepsis) is likely,     unless other causes are known.     (NOTE)             ICU PCT Algorithm               Non ICU PCT Algorithm        ----------------------------     ------------------------------             PCT < 0.25 ng/mL                 PCT < 0.1 ng/mL         Stopping of antibiotics  Stopping of antibiotics           strongly encouraged.               strongly encouraged.        ----------------------------     ------------------------------           PCT level decrease by               PCT < 0.25 ng/mL           >= 80% from peak PCT           OR PCT 0.25 - 0.5 ng/mL          Stopping of antibiotics                                                 encouraged.         Stopping of antibiotics               encouraged.        ----------------------------     ------------------------------           PCT level decrease by              PCT >= 0.25 ng/mL           < 80% from peak PCT            AND PCT >= 0.5 ng/mL            Continuing antibiotics                                                  encouraged.           Continuing antibiotics                encouraged.        ----------------------------     ------------------------------         PCT level increase compared          PCT > 0.5 ng/mL             with peak PCT AND              PCT >= 0.5 ng/mL             Escalation of antibiotics                                              strongly encouraged.          Escalation of antibiotics             strongly encouraged.  TROPONIN I     Status: Abnormal   Collection Time    07/07/12  3:35 PM      Result Value Range   Troponin I 1.45 (*) <0.30 ng/mL   Comment:            Due to the release kinetics of cTnI,     a negative result within the first hours     of the onset of symptoms does not rule out  myocardial infarction with certainty.     If myocardial infarction is still suspected,     repeat the test at appropriate intervals.     CRITICAL VALUE NOTED.  VALUE IS CONSISTENT WITH PREVIOUSLY REPORTED AND CALLED VALUE.  BASIC METABOLIC PANEL     Status: Abnormal   Collection Time    07/07/12  3:35 PM      Result Value Range   Sodium 139  135 - 145 mEq/L   Potassium 3.8  3.5 - 5.1 mEq/L   Comment: DELTA CHECK NOTED   Chloride 99  96 - 112 mEq/L   CO2 28  19 - 32 mEq/L   Glucose, Bld 302 (*) 70 - 99 mg/dL   BUN 21  6 - 23 mg/dL   Creatinine, Ser 7.84  0.50 - 1.10 mg/dL   Calcium 9.3  8.4 - 69.6 mg/dL   GFR calc non Af Amer 72 (*) >90 mL/min   GFR calc Af Amer 83 (*) >90 mL/min   Comment:            The eGFR has been calculated     using the CKD EPI equation.     This calculation has not been     validated in all clinical     situations.     eGFR's persistently     <90 mL/min signify     possible Chronic Kidney Disease.   Recent Results (from the past 240 hour(s))  CULTURE, BLOOD (ROUTINE X 2)     Status: None   Collection Time    07/06/12 11:55 AM      Result Value Range Status   Specimen Description BLOOD HAND RIGHT   Final   Special Requests BOTTLES DRAWN AEROBIC ONLY   Final   Culture  Setup Time 07/06/2012 16:06   Final   Culture     Final   Value: GRAM NEGATIVE RODS     [id number] Note: Gram Stain Report Called to,Read Back By and Verified With: CYRIL SORCHA 0640 [facility]   Report Status PENDING   Incomplete  CULTURE, BLOOD (ROUTINE X 2)     Status: None   Collection Time    07/06/12 12:00 PM      Result Value Range Status   Specimen Description BLOOD  ARM LEFT   Final   Special Requests BOTTLES DRAWN AEROBIC AND ANAEROBIC 10CC   Final   Culture  Setup Time 07/06/2012 16:06   Final   Culture     Final   Value: GRAM NEGATIVE RODS     [id number] Note: Gram Stain Report Called to,Read Back By and Verified With: CYRIL SORCHA 0640 [facility]   Report Status PENDING   Incomplete  MRSA PCR SCREENING     Status: None   Collection Time    07/06/12  8:06 PM      Result Value Range Status   MRSA by PCR NEGATIVE  NEGATIVE Final   Comment:            The GeneXpert MRSA Assay (FDA     approved for NASAL specimens     only), is one component of a     comprehensive MRSA colonization     surveillance program. It is not     intended to diagnose MRSA     infection nor to guide or     monitor treatment for     MRSA infections.   Creatinine:  Recent Labs  07/06/12 1028 07/06/12 2210 07/07/12 0340  07/07/12 1535  CREATININE 0.86 0.71 0.67 0.72    Xrays: See report/chart I have independently reviewed the CT scan and the urologic findings are as noted above.  Impression/Assessment:  Mild left hydronephrosis. Left distal ureteral calculus.  Atrial fibrillation. Pneumonia.  Plan:  Continue antibiotics. Treat her distal ureteral stone conservatively at this time since the hydronephrosis is mild and in view of her multiple co-morbidities. Repeat renal ultrasound to reassess hydronephrosis.  If it worsens would then consider JJ stent placement or percutaneous nephrostomy depending on patient's general condition.  Jo [name] 07/07/2012, 6:17 PM    CC: Dr. Coralyn Helling         Dr. Glynn Octave

## 2012-07-07 NOTE — Progress Notes (Signed)
CRITICAL VALUE ALERT  Critical value received:  Troponin-1.33  Date of notification:  07/07/2012  Time of notification:  0941  Critical value read back:yes  Nurse who received alert:  Verlon Au, RN   MD notified (1st page):  [facility]/Chris Byrds, PA   Time of first page:  10:11  MD notified (2nd page):  Time of second page:  Responding MD:  Tillman Sers, PA  Time MD responded:  1011

## 2012-07-08 ENCOUNTER — Inpatient Hospital Stay (HOSPITAL_COMMUNITY): Payer: Medicare Other

## 2012-07-08 DIAGNOSIS — A4150 Gram-negative sepsis, unspecified: Secondary | ICD-10-CM | POA: Diagnosis not present

## 2012-07-08 DIAGNOSIS — R1032 Left lower quadrant pain: Secondary | ICD-10-CM | POA: Diagnosis not present

## 2012-07-08 DIAGNOSIS — N1330 Unspecified hydronephrosis: Secondary | ICD-10-CM | POA: Diagnosis not present

## 2012-07-08 DIAGNOSIS — J690 Pneumonitis due to inhalation of food and vomit: Secondary | ICD-10-CM | POA: Diagnosis not present

## 2012-07-08 DIAGNOSIS — J9600 Acute respiratory failure, unspecified whether with hypoxia or hypercapnia: Secondary | ICD-10-CM | POA: Diagnosis not present

## 2012-07-08 DIAGNOSIS — A4159 Other Gram-negative sepsis: Secondary | ICD-10-CM | POA: Diagnosis not present

## 2012-07-08 DIAGNOSIS — N201 Calculus of ureter: Secondary | ICD-10-CM | POA: Diagnosis not present

## 2012-07-08 DIAGNOSIS — I4891 Unspecified atrial fibrillation: Secondary | ICD-10-CM | POA: Diagnosis not present

## 2012-07-08 DIAGNOSIS — J189 Pneumonia, unspecified organism: Secondary | ICD-10-CM | POA: Diagnosis not present

## 2012-07-08 DIAGNOSIS — I5033 Acute on chronic diastolic (congestive) heart failure: Secondary | ICD-10-CM | POA: Diagnosis not present

## 2012-07-08 DIAGNOSIS — N200 Calculus of kidney: Secondary | ICD-10-CM | POA: Diagnosis not present

## 2012-07-08 LAB — CBC
HCT: 36 % (ref 36.0–46.0)
Hemoglobin: 13.3 g/dL (ref 12.0–15.0)
MCH: 32.9 pg (ref 26.0–34.0)
MCHC: 36.9 g/dL — ABNORMAL HIGH (ref 30.0–36.0)
MCV: 89.1 fL (ref 78.0–100.0)
RDW: 13.4 % (ref 11.5–15.5)

## 2012-07-08 LAB — GLUCOSE, CAPILLARY
Glucose-Capillary: 174 mg/dL — ABNORMAL HIGH (ref 70–99)
Glucose-Capillary: 209 mg/dL — ABNORMAL HIGH (ref 70–99)
Glucose-Capillary: 213 mg/dL — ABNORMAL HIGH (ref 70–99)

## 2012-07-08 LAB — PROCALCITONIN: Procalcitonin: 4.86 ng/mL

## 2012-07-08 LAB — BASIC METABOLIC PANEL
BUN: 24 mg/dL — ABNORMAL HIGH (ref 6–23)
CO2: 29 mEq/L (ref 19–32)
Calcium: 10 mg/dL (ref 8.4–10.5)
GFR calc non Af Amer: 73 mL/min — ABNORMAL LOW (ref >90)
Glucose, Bld: 185 mg/dL — ABNORMAL HIGH (ref 70–99)
Sodium: 139 mEq/L (ref 135–145)

## 2012-07-08 MED ORDER — DILTIAZEM HCL ER COATED BEADS 240 MG PO CP24
240.0000 mg | ORAL_CAPSULE | Freq: Every day | ORAL | Status: DC
  Administered 2012-07-09 – 2012-07-11 (×3): 240 mg via ORAL
  Filled 2012-07-08 (×5): qty 1

## 2012-07-08 MED ORDER — ESTRADIOL 0.1 MG/GM VA CREA: 2.0000 g | TOPICAL_CREAM | VAGINAL | Status: DC

## 2012-07-08 MED ORDER — FUROSEMIDE 40 MG PO TABS
40.0000 mg | ORAL_TABLET | Freq: Every day | ORAL | Status: DC
  Administered 2012-07-08 – 2012-07-11 (×4): 40 mg via ORAL
  Filled 2012-07-08 (×5): qty 1

## 2012-07-08 MED ORDER — DEXTROSE 5 % IV SOLN
30.0000 mmol | Freq: Once | INTRAVENOUS | Status: AC
  Administered 2012-07-08: 30 mmol via INTRAVENOUS
  Filled 2012-07-08: qty 10

## 2012-07-08 MED ORDER — DILTIAZEM HCL ER COATED BEADS 180 MG PO CP24
180.0000 mg | ORAL_CAPSULE | Freq: Once | ORAL | Status: AC
  Administered 2012-07-08: 180 mg via ORAL
  Filled 2012-07-08: qty 1

## 2012-07-08 MED ORDER — ESTRADIOL 0.1 MG/GM VA CREA
1.0000 | TOPICAL_CREAM | VAGINAL | Status: DC
  Administered 2012-07-09: 1 via VAGINAL
  Filled 2012-07-08: qty 42.5

## 2012-07-08 MED ORDER — LORAZEPAM 0.5 MG PO TABS
0.5000 mg | ORAL_TABLET | Freq: Two times a day (BID) | ORAL | Status: DC | PRN
  Administered 2012-07-08 – 2012-07-11 (×4): 0.5 mg via ORAL
  Filled 2012-07-08 (×4): qty 1

## 2012-07-08 NOTE — Progress Notes (Signed)
Patient ID: Jo [name], female   DOB: [birth year], 79 y.o.   MRN: [id number]    SUBJECTIVE: Brief afib with RVR this morning, now back in NSR with stable BP and HR.  She diuresed well with IV Lasix yesterday.    Marland Kitchen apixaban  2.5 mg Oral BID  . [START ON 07/09/2012] atorvastatin  10 mg Oral Custom  . carvedilol  3.125 mg Oral BID WC  . [START ON 07/09/2012] diltiazem  240 mg Oral Daily  . [START ON 07/09/2012] estradiol  1 Applicatorful Vaginal Q M,W,F  . flecainide  50 mg Oral Q12H  . furosemide  40 mg Oral Daily  . insulin aspart  0-9 Units Subcutaneous Q4H  . piperacillin-tazobactam (ZOSYN)  IV  3.375 g Intravenous Q8H  . potassium phosphate IVPB (mmol)  30 mmol Intravenous Once  . senna-docusate  2 tablet Oral BID  . sertraline  50 mg Oral q morning - 10a  . [START ON 07/11/2012] Vitamin D (Ergocalciferol)  50,000 Units Oral Q Wed      Filed Vitals:   07/08/12 0727 07/08/12 0800 07/08/12 0900 07/08/12 0936  BP:  150/59 159/72 159/72  Pulse:  65 76   Temp: 97.8 F (36.6 C)     TempSrc: Oral     Resp:  20 19   Weight:      SpO2:  100% 95%     Intake/Output Summary (Last 24 hours) at 07/08/12 1113 Last data filed at 07/08/12 0800  Gross per 24 hour  Intake    445 ml  Output   2150 ml  Net  -1705 ml    LABS: Basic Metabolic Panel:  Recent Labs  78/46/96 0810 07/07/12 1535 07/08/12 0418  NA  --  139 139  K  --  3.8 3.4*  CL  --  99 100  CO2  --  28 29  GLUCOSE  --  302* 185*  BUN  --  21 24*  CREATININE  --  0.72 0.68  CALCIUM  --  9.3 10.0  MG 1.9  --  1.9  PHOS  --   --  2.2*   Liver Function Tests:  Recent Labs  07/06/12 1028  AST 28  ALT 52*  ALKPHOS 95  BILITOT 1.0  PROT 6.9  ALBUMIN 3.5    Recent Labs  07/06/12 1028  LIPASE 31   CBC:  Recent Labs  07/06/12 1028 07/07/12 0340 07/08/12 0003  WBC 13.1* 19.2* 23.9*  NEUTROABS 12.1*  --   --   HGB 12.4 12.5 13.3  HCT 35.1* 33.8* 36.0  MCV 90.5 88.5 89.1  PLT 103* 115* 130*    Cardiac Enzymes:  Recent Labs  07/07/12 0810 07/07/12 1535 07/08/12 0003  TROPONINI 1.33* 1.45* 0.69*   BNP: No components found with this basename: POCBNP,  D-Dimer: No results found for this basename: DDIMER,  in the last 72 hours Hemoglobin A1C: No results found for this basename: HGBA1C,  in the last 72 hours Fasting Lipid Panel: No results found for this basename: CHOL, HDL, LDLCALC, TRIG, CHOLHDL, LDLDIRECT,  in the last 72 hours Thyroid Function Tests: No results found for this basename: TSH, T4TOTAL, FREET3, T3FREE, THYROIDAB,  in the last 72 hours Anemia Panel: No results found for this basename: VITAMINB12, FOLATE, FERRITIN, TIBC, IRON, RETICCTPCT,  in the last 72 hours  RADIOLOGY: Ct Angio Chest Pe W/cm &/or Wo Cm  07/06/2012   *RADIOLOGY REPORT*  Clinical Data:  Shortness of breath, abdominal pain,  nausea.  CT ANGIOGRAPHY CHEST CT ABDOMEN AND PELVIS WITH CONTRAST  Technique:  Multidetector CT imaging of the chest was performed using the standard protocol during bolus administration of intravenous contrast.  Multiplanar CT image reconstructions including MIPs were obtained to evaluate the vascular anatomy. Multidetector CT imaging of the abdomen and pelvis was performed using the standard protocol during bolus administration of intravenous contrast.  Contrast: OMNIPAQUE IOHEXOL 350 MG/ML SOLN  Comparison:  CT of the pelvis of 02/12/2007.  CTA CHEST  Findings:  Mediastinum: Study is slightly limited by respiratory motion.  With these limitations in mind, there is no evidence of clinically relevant central, lobar or segmental sized pulmonary embolism. Unfortunately, smaller subsegmental sized pulmonary emboli cannot be accurately excluded on this examination. Heart size is borderline enlarged. There is no significant pericardial fluid, thickening or pericardial calcification.  Left-sided pacemaker device in place with lead tips terminating in the right atrial appendage and  the right ventricular apex.  A small hiatal hernia. The No pathologically enlarged mediastinal or hilar lymph nodes. There is atherosclerosis of the thoracic aorta, the great vessels of the mediastinum and the coronary arteries, including calcified atherosclerotic plaque in the left anterior descending and right coronary arteries.  Lungs/Pleura: Trace right and small left pleural effusions are simple in appearance layering dependently.  A small area of consolidative air space disease in the posterior aspect of the right lower lobe.  Multifocal other areas of ground-glass attenuation and extensive interlobular septal thickening noted throughout the lungs bilaterally, as well as peribronchovascular interstitial thickening, favored to represent a manifestation of pulmonary edema.  Laxity of the trachea and mainstem bronchi bilaterally which appears to change with respiration, compatible with tracheobronchial malacia.  Musculoskeletal: There are no aggressive appearing lytic or blastic lesions noted in the visualized portions of the skeleton.   Review of the MIP images confirms the above findings.  IMPRESSION:  1.  Despite the limitations of this examination, there is no evidence to suggest clinically relevant central, lobar or segmental sized pulmonary embolism. 2.  The appearance of the chest is most compatible with mild congestive heart failure. 3.  More confluent consolidative airspace disease in the posterior aspect of the right lower lobe Dutta reflects sequelae of mild aspiration or a developing site of infection. 4. Atherosclerosis, including two-vessel coronary artery disease. 5.  Tracheobronchial malacia. 6.  Additional incidental findings, as above.  CT ABDOMEN AND PELVIS  Findings:  Abdomen/Pelvis:  Image 58 of series 8 demonstrates a 7 mm calculus in the distal third of the left ureter shortly before the left ureterovesicular junction.  This is associated with mild to moderate left-sided hydroureteronephrosis  extensive perinephric stranding, compatible with obstruction.  The extrarenal pelvis is noted on the right side (normal anatomical variant).  5.1 x 3.4 x 4.0 cm mass in the right adrenal gland has internal fatty attenuation, most compatible with a large adrenal myelolipoma. There is also a 3.2 x 2.5 x 2.6 cm left adrenal mass which is indeterminate.  This mass measures 94 HU on the portal venous phase imaging, and 58 HU on the 3-minute post contrast delayed imaging, for a relative washout of approximately 39.4% (lesions with relative washout of less than 40% are considered indeterminate, however, that is based on a 15-minute delay).  A subcentimeter low attenuation lesion in segment three of the liver is too small to definitively characterize, but statistically likely a small cyst. Minimal intrahepatic biliary ductal dilatation.  Common bile duct is normal in caliber.  The appearance of the gallbladder and spleen is unremarkable.  A few pancreatic calcifications Searing be related to prior episodes of pancreatitis.  Small volume of perihepatic ascites.  No pneumoperitoneum.  No pathologic distension of small bowel.  Numerous reactive sized retroperitoneal lymph nodes are noted, but no definite pathologically enlarged abdominal or pelvic lymph nodes are identified on today's examination.  Uterus and ovaries are atrophic, but otherwise unremarkable in appearance.  Musculoskeletal: Old healed fractures of the parasymphyseal aspect of the right pelvis involving both superior and inferior pubic rami are noted. There are no aggressive appearing lytic or blastic lesions noted in the visualized portions of the skeleton.  Review of the MIP images confirms the above findings.  IMPRESSION: 1.  7 mm partially obstructive calculus in the distal third of the left ureter with mild to moderate left-sided hydroureteronephrosis and extensive left-sided perinephric stranding.  2.  5.1 x 3.4 x 4.0 cm fatty attenuation mass in the right  adrenal gland is compatible with a large myelolipoma. 3.  In addition, there is a 2.5 x 3.2 x 2.6 cm mass in the left adrenal gland which by washout characteristics almost certainly represents an adenoma. If for some clinical reason there is concern for metastatic lesion or primary adrenal malignancy, correlation with noncontrast CT of the abdomen could be obtained (the patient has a pacemaker device and is not a candidate for MRI examination). 4.  Atherosclerosis. 5.  Minimal intrahepatic biliary ductal dilatation without evidence of common bile duct or pancreatic duct dilatation.  This finding is of uncertain etiology and significance, but Mandella be chronic in this patient.  Correlation with liver function tests is suggested. 6.  Small volume of perihepatic ascites.   Original Report Authenticated By: Trudie Reed, M.D.   Ct Abdomen Pelvis W Contrast  07/06/2012   *RADIOLOGY REPORT*  Clinical Data:  Shortness of breath, abdominal pain, nausea.  CT ANGIOGRAPHY CHEST CT ABDOMEN AND PELVIS WITH CONTRAST  Technique:  Multidetector CT imaging of the chest was performed using the standard protocol during bolus administration of intravenous contrast.  Multiplanar CT image reconstructions including MIPs were obtained to evaluate the vascular anatomy. Multidetector CT imaging of the abdomen and pelvis was performed using the standard protocol during bolus administration of intravenous contrast.  Contrast: OMNIPAQUE IOHEXOL 350 MG/ML SOLN  Comparison:  CT of the pelvis of 02/12/2007.  CTA CHEST  Findings:  Mediastinum: Study is slightly limited by respiratory motion.  With these limitations in mind, there is no evidence of clinically relevant central, lobar or segmental sized pulmonary embolism. Unfortunately, smaller subsegmental sized pulmonary emboli cannot be accurately excluded on this examination. Heart size is borderline enlarged. There is no significant pericardial fluid, thickening or pericardial  calcification.  Left-sided pacemaker device in place with lead tips terminating in the right atrial appendage and the right ventricular apex.  A small hiatal hernia. The No pathologically enlarged mediastinal or hilar lymph nodes. There is atherosclerosis of the thoracic aorta, the great vessels of the mediastinum and the coronary arteries, including calcified atherosclerotic plaque in the left anterior descending and right coronary arteries.  Lungs/Pleura: Trace right and small left pleural effusions are simple in appearance layering dependently.  A small area of consolidative air space disease in the posterior aspect of the right lower lobe.  Multifocal other areas of ground-glass attenuation and extensive interlobular septal thickening noted throughout the lungs bilaterally, as well as peribronchovascular interstitial thickening, favored to represent a manifestation of pulmonary edema.  Laxity of  the trachea and mainstem bronchi bilaterally which appears to change with respiration, compatible with tracheobronchial malacia.  Musculoskeletal: There are no aggressive appearing lytic or blastic lesions noted in the visualized portions of the skeleton.   Review of the MIP images confirms the above findings.  IMPRESSION:  1.  Despite the limitations of this examination, there is no evidence to suggest clinically relevant central, lobar or segmental sized pulmonary embolism. 2.  The appearance of the chest is most compatible with mild congestive heart failure. 3.  More confluent consolidative airspace disease in the posterior aspect of the right lower lobe Jupin reflects sequelae of mild aspiration or a developing site of infection. 4. Atherosclerosis, including two-vessel coronary artery disease. 5.  Tracheobronchial malacia. 6.  Additional incidental findings, as above.  CT ABDOMEN AND PELVIS  Findings:  Abdomen/Pelvis:  Image 58 of series 8 demonstrates a 7 mm calculus in the distal third of the left ureter shortly  before the left ureterovesicular junction.  This is associated with mild to moderate left-sided hydroureteronephrosis extensive perinephric stranding, compatible with obstruction.  The extrarenal pelvis is noted on the right side (normal anatomical variant).  5.1 x 3.4 x 4.0 cm mass in the right adrenal gland has internal fatty attenuation, most compatible with a large adrenal myelolipoma. There is also a 3.2 x 2.5 x 2.6 cm left adrenal mass which is indeterminate.  This mass measures 94 HU on the portal venous phase imaging, and 58 HU on the 3-minute post contrast delayed imaging, for a relative washout of approximately 39.4% (lesions with relative washout of less than 40% are considered indeterminate, however, that is based on a 15-minute delay).  A subcentimeter low attenuation lesion in segment three of the liver is too small to definitively characterize, but statistically likely a small cyst. Minimal intrahepatic biliary ductal dilatation.  Common bile duct is normal in caliber.  The appearance of the gallbladder and spleen is unremarkable.  A few pancreatic calcifications Buckholtz be related to prior episodes of pancreatitis.  Small volume of perihepatic ascites.  No pneumoperitoneum.  No pathologic distension of small bowel.  Numerous reactive sized retroperitoneal lymph nodes are noted, but no definite pathologically enlarged abdominal or pelvic lymph nodes are identified on today's examination.  Uterus and ovaries are atrophic, but otherwise unremarkable in appearance.  Musculoskeletal: Old healed fractures of the parasymphyseal aspect of the right pelvis involving both superior and inferior pubic rami are noted. There are no aggressive appearing lytic or blastic lesions noted in the visualized portions of the skeleton.  Review of the MIP images confirms the above findings.  IMPRESSION: 1.  7 mm partially obstructive calculus in the distal third of the left ureter with mild to moderate left-sided  hydroureteronephrosis and extensive left-sided perinephric stranding.  2.  5.1 x 3.4 x 4.0 cm fatty attenuation mass in the right adrenal gland is compatible with a large myelolipoma. 3.  In addition, there is a 2.5 x 3.2 x 2.6 cm mass in the left adrenal gland which by washout characteristics almost certainly represents an adenoma. If for some clinical reason there is concern for metastatic lesion or primary adrenal malignancy, correlation with noncontrast CT of the abdomen could be obtained (the patient has a pacemaker device and is not a candidate for MRI examination). 4.  Atherosclerosis. 5.  Minimal intrahepatic biliary ductal dilatation without evidence of common bile duct or pancreatic duct dilatation.  This finding is of uncertain etiology and significance, but Guyette be chronic in this patient.  Correlation with liver function tests is suggested. 6.  Small volume of perihepatic ascites.   Original Report Authenticated By: Trudie Reed, M.D.   PHYSICAL EXAM General: NAD Neck: JVP 7-8 cm, no thyromegaly or thyroid nodule.  Lungs: Decreased breath sounds right base. CV: Nondisplaced PMI.  Heart regular S1/S2, no S3/S4, no murmur.  No peripheral edema.  No carotid bruit.  Normal pedal pulses.  Abdomen: Soft, nontender, no hepatosplenomegaly, no distention.  Neurologic: Alert and oriented x 3.  Psych: Normal affect. Extremities: No clubbing or cyanosis.   TELEMETRY: Reviewed telemetry pt in NSR  ASSESSMENT AND PLAN: 79 yo with history of PAF on flecainide, HTN, and diastolic CHF presented with RLL PNA and possible UTI along with atrial fibrillation/RVR and diastolic CHF exacerbation.  1. Acute/chronic diastolic CHF: In setting of atrial fibrillation with RVR.  She diuresed well yesterday, volume looks much better.  - Start po Lasix 40 daily for now.    2. Atrial fibrillation: Now back in NSR.  Hopefully will stop having atrial fibrillation bursts as infection is treated. - Continue flecainide  to maintain NSR and Coreg. - Increase diltiazem CD to 240 mg daily.  - Continue apixaban.  3. ID: UTI, possible RLL PNA.  E coli in urine.  On antibiotics per CCM.   Marca Ancona 07/08/2012 11:13 AM

## 2012-07-08 NOTE — Progress Notes (Signed)
eLink Physician-Brief Progress Note Patient Name: Jo [name] DOB: 1917-08-07 MRN: [id number]  Date of Service  07/08/2012   HPI/Events of Note  Hypokalemia and hypophosphatemia   eICU Interventions  Potassium and Phos replaced   Intervention Category Intermediate Interventions: Electrolyte abnormality - evaluation and management  Karo Rog 07/08/2012, 5:07 AM

## 2012-07-08 NOTE — Progress Notes (Signed)
Subjective: Patient reports : No abdominal pain today.  No bowel movement as yet.  Objective: Vital signs in last 24 hours: Temp:  [97.5 F (36.4 C)-98 F (36.7 C)] 97.8 F (36.6 C) (06/15 0727) Pulse Rate:  [59-127] 76 (06/15 0900) Resp:  [15-25] 19 (06/15 0900) BP: (98-178)/(45-130) 159/72 mmHg (06/15 0936) SpO2:  [94 %-100 %] 95 % (06/15 0900)  Intake/Output from previous day: 06/14 0701 - 06/15 0700 In: 765.8 [P.O.:240; I.V.:413.3; IV Piggyback:112.5] Out: 2100 [Urine:2100] Intake/Output this shift: Total I/O In: -  Out: 350 [Urine:350]  Physical Exam:  General: More alert today Abdomen: Soft, non distended, non tender.  No CVA tenderness   Lab Results:  Recent Labs  07/06/12 1028 07/07/12 0340 07/08/12 0003  HGB 12.4 12.5 13.3  HCT 35.1* 33.8* 36.0   BMET  Recent Labs  07/07/12 1535 07/08/12 0418  NA 139 139  K 3.8 3.4*  CL 99 100  CO2 28 29  GLUCOSE 302* 185*  BUN 21 24*  CREATININE 0.72 0.68  CALCIUM 9.3 10.0    Recent Labs  07/06/12 1028  INR 1.41   No results found for this basename: LABURIN,  in the last 72 hours Results for orders placed during the hospital encounter of 07/06/12  URINE CULTURE     Status: None   Collection Time    07/06/12 11:30 AM      Result Value Range Status   Specimen Description URINE, CATHETERIZED   Final   Special Requests Normal   Final   Culture  Setup Time 07/06/2012 19:19   Final   Colony Count >=100,000 COLONIES/ML   Final   Culture ESCHERICHIA COLI   Final   Report Status PENDING   Incomplete  CULTURE, BLOOD (ROUTINE X 2)     Status: None   Collection Time    07/06/12 11:55 AM      Result Value Range Status   Specimen Description BLOOD HAND RIGHT   Final   Special Requests BOTTLES DRAWN AEROBIC ONLY   Final   Culture  Setup Time 07/06/2012 16:06   Final   Culture     Final   Value: GRAM NEGATIVE RODS     [id number] Note: Gram Stain Report Called to,Read Back By and Verified With: CYRIL  SORCHA 0640 [facility]   Report Status PENDING   Incomplete  CULTURE, BLOOD (ROUTINE X 2)     Status: None   Collection Time    07/06/12 12:00 PM      Result Value Range Status   Specimen Description BLOOD ARM LEFT   Final   Special Requests BOTTLES DRAWN AEROBIC AND ANAEROBIC 10CC   Final   Culture  Setup Time 07/06/2012 16:06   Final   Culture     Final   Value: GRAM NEGATIVE RODS     [id number] Note: Gram Stain Report Called to,Read Back By and Verified With: CYRIL SORCHA 0640 [facility]   Report Status PENDING   Incomplete  MRSA PCR SCREENING     Status: None   Collection Time    07/06/12  8:06 PM      Result Value Range Status   MRSA by PCR NEGATIVE  NEGATIVE Final   Comment:            The GeneXpert MRSA Assay (FDA     approved for NASAL specimens     only), is one component of a     comprehensive MRSA colonization     surveillance program.  It is not     intended to diagnose MRSA     infection nor to guide or     monitor treatment for     MRSA infections.    Studies/Results: Ct Angio Chest Pe W/cm &/or Wo Cm  07/06/2012   *RADIOLOGY REPORT*  Clinical Data:  Shortness of breath, abdominal pain, nausea.  CT ANGIOGRAPHY CHEST CT ABDOMEN AND PELVIS WITH CONTRAST  Technique:  Multidetector CT imaging of the chest was performed using the standard protocol during bolus administration of intravenous contrast.  Multiplanar CT image reconstructions including MIPs were obtained to evaluate the vascular anatomy. Multidetector CT imaging of the abdomen and pelvis was performed using the standard protocol during bolus administration of intravenous contrast.  Contrast: OMNIPAQUE IOHEXOL 350 MG/ML SOLN  Comparison:  CT of the pelvis of 02/12/2007.  CTA CHEST  Findings:  Mediastinum: Study is slightly limited by respiratory motion.  With these limitations in mind, there is no evidence of clinically relevant central, lobar or segmental sized pulmonary embolism. Unfortunately, smaller subsegmental sized  pulmonary emboli cannot be accurately excluded on this examination. Heart size is borderline enlarged. There is no significant pericardial fluid, thickening or pericardial calcification.  Left-sided pacemaker device in place with lead tips terminating in the right atrial appendage and the right ventricular apex.  A small hiatal hernia. The No pathologically enlarged mediastinal or hilar lymph nodes. There is atherosclerosis of the thoracic aorta, the great vessels of the mediastinum and the coronary arteries, including calcified atherosclerotic plaque in the left anterior descending and right coronary arteries.  Lungs/Pleura: Trace right and small left pleural effusions are simple in appearance layering dependently.  A small area of consolidative air space disease in the posterior aspect of the right lower lobe.  Multifocal other areas of ground-glass attenuation and extensive interlobular septal thickening noted throughout the lungs bilaterally, as well as peribronchovascular interstitial thickening, favored to represent a manifestation of pulmonary edema.  Laxity of the trachea and mainstem bronchi bilaterally which appears to change with respiration, compatible with tracheobronchial malacia.  Musculoskeletal: There are no aggressive appearing lytic or blastic lesions noted in the visualized portions of the skeleton.   Review of the MIP images confirms the above findings.  IMPRESSION:  1.  Despite the limitations of this examination, there is no evidence to suggest clinically relevant central, lobar or segmental sized pulmonary embolism. 2.  The appearance of the chest is most compatible with mild congestive heart failure. 3.  More confluent consolidative airspace disease in the posterior aspect of the right lower lobe Engelbrecht reflects sequelae of mild aspiration or a developing site of infection. 4. Atherosclerosis, including two-vessel coronary artery disease. 5.  Tracheobronchial malacia. 6.  Additional incidental  findings, as above.  CT ABDOMEN AND PELVIS  Findings:  Abdomen/Pelvis:  Image 58 of series 8 demonstrates a 7 mm calculus in the distal third of the left ureter shortly before the left ureterovesicular junction.  This is associated with mild to moderate left-sided hydroureteronephrosis extensive perinephric stranding, compatible with obstruction.  The extrarenal pelvis is noted on the right side (normal anatomical variant).  5.1 x 3.4 x 4.0 cm mass in the right adrenal gland has internal fatty attenuation, most compatible with a large adrenal myelolipoma. There is also a 3.2 x 2.5 x 2.6 cm left adrenal mass which is indeterminate.  This mass measures 94 HU on the portal venous phase imaging, and 58 HU on the 3-minute post contrast delayed imaging, for a relative washout  of approximately 39.4% (lesions with relative washout of less than 40% are considered indeterminate, however, that is based on a 15-minute delay).  A subcentimeter low attenuation lesion in segment three of the liver is too small to definitively characterize, but statistically likely a small cyst. Minimal intrahepatic biliary ductal dilatation.  Common bile duct is normal in caliber.  The appearance of the gallbladder and spleen is unremarkable.  A few pancreatic calcifications Hovater be related to prior episodes of pancreatitis.  Small volume of perihepatic ascites.  No pneumoperitoneum.  No pathologic distension of small bowel.  Numerous reactive sized retroperitoneal lymph nodes are noted, but no definite pathologically enlarged abdominal or pelvic lymph nodes are identified on today's examination.  Uterus and ovaries are atrophic, but otherwise unremarkable in appearance.  Musculoskeletal: Old healed fractures of the parasymphyseal aspect of the right pelvis involving both superior and inferior pubic rami are noted. There are no aggressive appearing lytic or blastic lesions noted in the visualized portions of the skeleton.  Review of the MIP images  confirms the above findings.  IMPRESSION: 1.  7 mm partially obstructive calculus in the distal third of the left ureter with mild to moderate left-sided hydroureteronephrosis and extensive left-sided perinephric stranding.  2.  5.1 x 3.4 x 4.0 cm fatty attenuation mass in the right adrenal gland is compatible with a large myelolipoma. 3.  In addition, there is a 2.5 x 3.2 x 2.6 cm mass in the left adrenal gland which by washout characteristics almost certainly represents an adenoma. If for some clinical reason there is concern for metastatic lesion or primary adrenal malignancy, correlation with noncontrast CT of the abdomen could be obtained (the patient has a pacemaker device and is not a candidate for MRI examination). 4.  Atherosclerosis. 5.  Minimal intrahepatic biliary ductal dilatation without evidence of common bile duct or pancreatic duct dilatation.  This finding is of uncertain etiology and significance, but Wile be chronic in this patient.  Correlation with liver function tests is suggested. 6.  Small volume of perihepatic ascites.   Original Report Authenticated By: Trudie Reed, M.D.   Ct Abdomen Pelvis W Contrast  07/06/2012   *RADIOLOGY REPORT*  Clinical Data:  Shortness of breath, abdominal pain, nausea.  CT ANGIOGRAPHY CHEST CT ABDOMEN AND PELVIS WITH CONTRAST  Technique:  Multidetector CT imaging of the chest was performed using the standard protocol during bolus administration of intravenous contrast.  Multiplanar CT image reconstructions including MIPs were obtained to evaluate the vascular anatomy. Multidetector CT imaging of the abdomen and pelvis was performed using the standard protocol during bolus administration of intravenous contrast.  Contrast: OMNIPAQUE IOHEXOL 350 MG/ML SOLN  Comparison:  CT of the pelvis of 02/12/2007.  CTA CHEST  Findings:  Mediastinum: Study is slightly limited by respiratory motion.  With these limitations in mind, there is no evidence of clinically  relevant central, lobar or segmental sized pulmonary embolism. Unfortunately, smaller subsegmental sized pulmonary emboli cannot be accurately excluded on this examination. Heart size is borderline enlarged. There is no significant pericardial fluid, thickening or pericardial calcification.  Left-sided pacemaker device in place with lead tips terminating in the right atrial appendage and the right ventricular apex.  A small hiatal hernia. The No pathologically enlarged mediastinal or hilar lymph nodes. There is atherosclerosis of the thoracic aorta, the great vessels of the mediastinum and the coronary arteries, including calcified atherosclerotic plaque in the left anterior descending and right coronary arteries.  Lungs/Pleura: Trace right and small left  pleural effusions are simple in appearance layering dependently.  A small area of consolidative air space disease in the posterior aspect of the right lower lobe.  Multifocal other areas of ground-glass attenuation and extensive interlobular septal thickening noted throughout the lungs bilaterally, as well as peribronchovascular interstitial thickening, favored to represent a manifestation of pulmonary edema.  Laxity of the trachea and mainstem bronchi bilaterally which appears to change with respiration, compatible with tracheobronchial malacia.  Musculoskeletal: There are no aggressive appearing lytic or blastic lesions noted in the visualized portions of the skeleton.   Review of the MIP images confirms the above findings.  IMPRESSION:  1.  Despite the limitations of this examination, there is no evidence to suggest clinically relevant central, lobar or segmental sized pulmonary embolism. 2.  The appearance of the chest is most compatible with mild congestive heart failure. 3.  More confluent consolidative airspace disease in the posterior aspect of the right lower lobe Bougher reflects sequelae of mild aspiration or a developing site of infection. 4.  Atherosclerosis, including two-vessel coronary artery disease. 5.  Tracheobronchial malacia. 6.  Additional incidental findings, as above.  CT ABDOMEN AND PELVIS  Findings:  Abdomen/Pelvis:  Image 58 of series 8 demonstrates a 7 mm calculus in the distal third of the left ureter shortly before the left ureterovesicular junction.  This is associated with mild to moderate left-sided hydroureteronephrosis extensive perinephric stranding, compatible with obstruction.  The extrarenal pelvis is noted on the right side (normal anatomical variant).  5.1 x 3.4 x 4.0 cm mass in the right adrenal gland has internal fatty attenuation, most compatible with a large adrenal myelolipoma. There is also a 3.2 x 2.5 x 2.6 cm left adrenal mass which is indeterminate.  This mass measures 94 HU on the portal venous phase imaging, and 58 HU on the 3-minute post contrast delayed imaging, for a relative washout of approximately 39.4% (lesions with relative washout of less than 40% are considered indeterminate, however, that is based on a 15-minute delay).  A subcentimeter low attenuation lesion in segment three of the liver is too small to definitively characterize, but statistically likely a small cyst. Minimal intrahepatic biliary ductal dilatation.  Common bile duct is normal in caliber.  The appearance of the gallbladder and spleen is unremarkable.  A few pancreatic calcifications Caruth be related to prior episodes of pancreatitis.  Small volume of perihepatic ascites.  No pneumoperitoneum.  No pathologic distension of small bowel.  Numerous reactive sized retroperitoneal lymph nodes are noted, but no definite pathologically enlarged abdominal or pelvic lymph nodes are identified on today's examination.  Uterus and ovaries are atrophic, but otherwise unremarkable in appearance.  Musculoskeletal: Old healed fractures of the parasymphyseal aspect of the right pelvis involving both superior and inferior pubic rami are noted. There are no  aggressive appearing lytic or blastic lesions noted in the visualized portions of the skeleton.  Review of the MIP images confirms the above findings.  IMPRESSION: 1.  7 mm partially obstructive calculus in the distal third of the left ureter with mild to moderate left-sided hydroureteronephrosis and extensive left-sided perinephric stranding.  2.  5.1 x 3.4 x 4.0 cm fatty attenuation mass in the right adrenal gland is compatible with a large myelolipoma. 3.  In addition, there is a 2.5 x 3.2 x 2.6 cm mass in the left adrenal gland which by washout characteristics almost certainly represents an adenoma. If for some clinical reason there is concern for metastatic lesion or primary adrenal malignancy,  correlation with noncontrast CT of the abdomen could be obtained (the patient has a pacemaker device and is not a candidate for MRI examination). 4.  Atherosclerosis. 5.  Minimal intrahepatic biliary ductal dilatation without evidence of common bile duct or pancreatic duct dilatation.  This finding is of uncertain etiology and significance, but Scalf be chronic in this patient.  Correlation with liver function tests is suggested. 6.  Small volume of perihepatic ascites.   Original Report Authenticated By: Trudie Reed, M.D.   US Renal Port  07/06/2012   *RADIOLOGY REPORT*  Clinical Data: Left ureteral calculus causing mild to moderate left hydronephrosis on an abdomen and pelvis CT earlier today.  RENAL/URINARY TRACT ULTRASOUND COMPLETE  Comparison:  Abdomen pelvis CT obtained earlier today.  Findings:  Right Kidney:  Normal, measuring 10.9 cm in length.  Previously demonstrated prominent extrarenal pelvis.  Left Kidney:  Mild to moderate dilatation of the left renal collecting system with possible mild improvement.  Otherwise, normal with fetal lobulations, measuring 11.9 cm in length.  Bladder:  Nondistended with a Foley catheter in place.  Multiple gallstones are noted in the gallbladder measuring up to 1.8 cm in  maximum diameter each.  Unable to determine if the patient was focally tender over the gallbladder due to medication. No gallbladder wall thickening or pericholecystic fluid.  Normal caliber common duct measuring 3.9 mm in diameter proximally.  Poorly defined rounded echogenic mass superior to the right kidney, corresponding to the large adrenal myelolipoma seen on the CT.  IMPRESSION:  1.  Mild to moderate left hydronephrosis with possible mild improvement (difficult to directly compare different modalities). 2.  Cholelithiasis without evidence of cholecystitis. 3.  Previously demonstrated large right adrenal myelolipoma.   Original Report Authenticated By: Beckie Salts, M.D.   Dg Chest Port 1 View  07/07/2012   *RADIOLOGY REPORT*  Clinical Data: Evaluate for pulmonary edema  PORTABLE CHEST - 1 VIEW  Comparison: Recent chest CT 07/06/2012; prior chest x-ray 06/04/2012  Findings: Stable position of left subclavian approach cardiac rhythm maintenance device with leads projecting over the right atrium and right ventricle.  Inspiratory volumes are slightly low and there is trace bibasilar subsegmental atelectasis.  Mild pulmonary vascular congestion without overt edema.  Stable cardiac and mediastinal contours with enlargement of the bilateral main pulmonary arteries.  Atherosclerotic calcifications noted in the transverse aorta.  No acute osseous abnormality.  IMPRESSION:  1.  Decreased inspiratory volumes with mild bibasilar atelectasis. 2.  Mild pulmonary vascular congestion without overt edema   Original Report Authenticated By: Malachy Moan, M.D.   Dg Abd Acute W/chest  07/06/2012   *RADIOLOGY REPORT*  Clinical Data: Abdominal pain  ACUTE ABDOMEN SERIES (ABDOMEN 2 VIEW & CHEST 1 VIEW)  Comparison: Chest 06/04/2012  Findings: Increased lung markings diffusely and bilaterally compared to the prior study.  This Fruth be due to heart failure and vascular congestion.  Acute pulmonary infection diffusely is also  possible.  Negative for lumbar infiltrate.  Pacemaker in good position.  No effusion.  Abdomen:  Nonobstructive bowel gas pattern.  No free air.  No dilated bowel loops.  No renal calculi.  IMPRESSION: Prominent lung markings diffusely and bilaterally suggestive of fluid overload however diffuse infection also possible  Nonobstructive bowel gas pattern.   Original Report Authenticated By: Janeece Riggers, M.D.    Assessment/Plan:  Left distal ureteral calculus.  Mild to moderate left hydronephrosis.  E. coli UTI.  Renal ultrasound  If hydronephrosis worsens will discuss stone manipulation versus percutaneous  nephrostomy to relieve obstruction   LOS: 2 days   Ryleeann Urquiza-HENRY 07/08/2012, 10:13 AM

## 2012-07-08 NOTE — Progress Notes (Signed)
PULMONARY  / CRITICAL CARE MEDICINE  Name: Jo [name] MRN: [id number] DOB: 01-19-1918    ADMISSION DATE:  07/06/2012  REFERRING MD :  EDP PRIMARY SERVICE: PCCM  CHIEF COMPLAINT:  Hypotension    BRIEF:  79 y/o F with a PMH of HTN, PAF s/p pacemaker on Eliquis, DM (diet controlled) who presented to [facility] ER on 6/13 with four day history of abdominal pain & nausea.  While in ER she was noted to have room air saturations in 80's and tachypnea.  CTA Chest negative for PE, suggestive of mild CHF, and RLL consolidative disease concerning for infection.  Abd CT demonstrated L renal obstructing calculi with hydronephrosis, extensive perinephric stranding.  Lactic acid 1.6.  Later in ER course, she had Afib with RVR and Rx'd with Cardizem / lasix, subsequent hypotension.  UA positive for nitrites, many bacteria, 0-2 WBC, greater than 1000k glucose.  PCCM consulted for SIRS / Hypotension.   Patient denies shortness of breath, abd pain, nausea, pain with urination, abnormal BM, chest pain, chest pain with inspiration. Last BM 6/12    LINES / TUBES:   CULTURES: UA 6/13>>>pos nitrite, 0-2 wbc, many bacteria, >1000k glucose BCx2 6/13>>> GNR in BOTH BOTTLES UC 6/13>>> E COLII    ANTIBIOTICS: Zosyn 6/13 (urinary / obs & asp)>>>  SIGNIFICANT EVENTS / STUDIES:  6/13 - Admit with obstructing renal stone, hydro.  AFib with RVR, Rx'd then hypotensive.   07/07/12:  Overnight no events. BP stable.Not on pressors. Not intubated. Getting IVF with KCL. On cardizem gtt, and oral flecanide but currently not on her home Coreg    SUBJECTIVE/OVERNIGHT/INTERVAL HX 07/08/12: Intermittent nausea and vomiting yesterday. Required all her home laxatives and when necessary Zofran and Reglan and restarting her home anxiety medications lorazepam.   Urology planning a repeat ultrasound to decide on intervention  Also back in sinus and Cardizem infusion change to oral Cardizem. She is back on home for flecanid  and oral Coreg and oral Lasix  VITAL SIGNS: Temp:  [97.5 F (36.4 C)-98 F (36.7 C)] 97.8 F (36.6 C) (06/15 0727) Pulse Rate:  [59-127] 76 (06/15 0900) Resp:  [15-25] 19 (06/15 0900) BP: (98-178)/(45-130) 159/72 mmHg (06/15 0936) SpO2:  [94 %-100 %] 95 % (06/15 0900)  HEMODYNAMICS:    VENTILATOR SETTINGS:    INTAKE / OUTPUT: Intake/Output     06/14 0701 - 06/15 0700 06/15 0701 - 06/16 0700   P.O. 240    I.V. (mL/kg) 413.3 (7.1)    IV Piggyback 112.5    Total Intake(mL/kg) 765.8 (13.1)    Urine (mL/kg/hr) 2100 (1.5) 350 (1.7)   Total Output 2100 350   Net -1334.2 -350          PHYSICAL EXAMINATION: General:  Frail elderly female in NAD N on 3L nasal cannula looks stable . Sitting in chair looks better Neuro:  AAOx4, speech clear, MAE. Hard of hearing  HEENT:  Mm pink/moist Cardiovascular:  s1s2 distant, irregular Lungs:  resp's shallow, non-labored, lungs bilaterally with faint posterior crackles Abdomen:  Round, mild distention but soft, bsx4 hypoactive Musculoskeletal:  No acute deformities Skin:  Warm/dry, no edema  LABS  - see A and P  IMAGING x  48h Ct Angio Chest Pe W/cm &/or Wo Cm  07/06/2012   *RADIOLOGY REPORT*  Clinical Data:  Shortness of breath, abdominal pain, nausea.  CT ANGIOGRAPHY CHEST CT ABDOMEN AND PELVIS WITH CONTRAST  Technique:  Multidetector CT imaging of the chest was performed using the  standard protocol during bolus administration of intravenous contrast.  Multiplanar CT image reconstructions including MIPs were obtained to evaluate the vascular anatomy. Multidetector CT imaging of the abdomen and pelvis was performed using the standard protocol during bolus administration of intravenous contrast.  Contrast: OMNIPAQUE IOHEXOL 350 MG/ML SOLN  Comparison:  CT of the pelvis of 02/12/2007.  CTA CHEST  Findings:  Mediastinum: Study is slightly limited by respiratory motion.  With these limitations in mind, there is no evidence of clinically  relevant central, lobar or segmental sized pulmonary embolism. Unfortunately, smaller subsegmental sized pulmonary emboli cannot be accurately excluded on this examination. Heart size is borderline enlarged. There is no significant pericardial fluid, thickening or pericardial calcification.  Left-sided pacemaker device in place with lead tips terminating in the right atrial appendage and the right ventricular apex.  A small hiatal hernia. The No pathologically enlarged mediastinal or hilar lymph nodes. There is atherosclerosis of the thoracic aorta, the great vessels of the mediastinum and the coronary arteries, including calcified atherosclerotic plaque in the left anterior descending and right coronary arteries.  Lungs/Pleura: Trace right and small left pleural effusions are simple in appearance layering dependently.  A small area of consolidative air space disease in the posterior aspect of the right lower lobe.  Multifocal other areas of ground-glass attenuation and extensive interlobular septal thickening noted throughout the lungs bilaterally, as well as peribronchovascular interstitial thickening, favored to represent a manifestation of pulmonary edema.  Laxity of the trachea and mainstem bronchi bilaterally which appears to change with respiration, compatible with tracheobronchial malacia.  Musculoskeletal: There are no aggressive appearing lytic or blastic lesions noted in the visualized portions of the skeleton.   Review of the MIP images confirms the above findings.  IMPRESSION:  1.  Despite the limitations of this examination, there is no evidence to suggest clinically relevant central, lobar or segmental sized pulmonary embolism. 2.  The appearance of the chest is most compatible with mild congestive heart failure. 3.  More confluent consolidative airspace disease in the posterior aspect of the right lower lobe Dec reflects sequelae of mild aspiration or a developing site of infection. 4.  Atherosclerosis, including two-vessel coronary artery disease. 5.  Tracheobronchial malacia. 6.  Additional incidental findings, as above.  CT ABDOMEN AND PELVIS  Findings:  Abdomen/Pelvis:  Image 58 of series 8 demonstrates a 7 mm calculus in the distal third of the left ureter shortly before the left ureterovesicular junction.  This is associated with mild to moderate left-sided hydroureteronephrosis extensive perinephric stranding, compatible with obstruction.  The extrarenal pelvis is noted on the right side (normal anatomical variant).  5.1 x 3.4 x 4.0 cm mass in the right adrenal gland has internal fatty attenuation, most compatible with a large adrenal myelolipoma. There is also a 3.2 x 2.5 x 2.6 cm left adrenal mass which is indeterminate.  This mass measures 94 HU on the portal venous phase imaging, and 58 HU on the 3-minute post contrast delayed imaging, for a relative washout of approximately 39.4% (lesions with relative washout of less than 40% are considered indeterminate, however, that is based on a 15-minute delay).  A subcentimeter low attenuation lesion in segment three of the liver is too small to definitively characterize, but statistically likely a small cyst. Minimal intrahepatic biliary ductal dilatation.  Common bile duct is normal in caliber.  The appearance of the gallbladder and spleen is unremarkable.  A few pancreatic calcifications Saadeh be related to prior episodes of pancreatitis.  Small  volume of perihepatic ascites.  No pneumoperitoneum.  No pathologic distension of small bowel.  Numerous reactive sized retroperitoneal lymph nodes are noted, but no definite pathologically enlarged abdominal or pelvic lymph nodes are identified on today's examination.  Uterus and ovaries are atrophic, but otherwise unremarkable in appearance.  Musculoskeletal: Old healed fractures of the parasymphyseal aspect of the right pelvis involving both superior and inferior pubic rami are noted. There are no  aggressive appearing lytic or blastic lesions noted in the visualized portions of the skeleton.  Review of the MIP images confirms the above findings.  IMPRESSION: 1.  7 mm partially obstructive calculus in the distal third of the left ureter with mild to moderate left-sided hydroureteronephrosis and extensive left-sided perinephric stranding.  2.  5.1 x 3.4 x 4.0 cm fatty attenuation mass in the right adrenal gland is compatible with a large myelolipoma. 3.  In addition, there is a 2.5 x 3.2 x 2.6 cm mass in the left adrenal gland which by washout characteristics almost certainly represents an adenoma. If for some clinical reason there is concern for metastatic lesion or primary adrenal malignancy, correlation with noncontrast CT of the abdomen could be obtained (the patient has a pacemaker device and is not a candidate for MRI examination). 4.  Atherosclerosis. 5.  Minimal intrahepatic biliary ductal dilatation without evidence of common bile duct or pancreatic duct dilatation.  This finding is of uncertain etiology and significance, but Normoyle be chronic in this patient.  Correlation with liver function tests is suggested. 6.  Small volume of perihepatic ascites.   Original Report Authenticated By: Trudie Reed, M.D.   Ct Abdomen Pelvis W Contrast  07/06/2012   *RADIOLOGY REPORT*  Clinical Data:  Shortness of breath, abdominal pain, nausea.  CT ANGIOGRAPHY CHEST CT ABDOMEN AND PELVIS WITH CONTRAST  Technique:  Multidetector CT imaging of the chest was performed using the standard protocol during bolus administration of intravenous contrast.  Multiplanar CT image reconstructions including MIPs were obtained to evaluate the vascular anatomy. Multidetector CT imaging of the abdomen and pelvis was performed using the standard protocol during bolus administration of intravenous contrast.  Contrast: OMNIPAQUE IOHEXOL 350 MG/ML SOLN  Comparison:  CT of the pelvis of 02/12/2007.  CTA CHEST  Findings:   Mediastinum: Study is slightly limited by respiratory motion.  With these limitations in mind, there is no evidence of clinically relevant central, lobar or segmental sized pulmonary embolism. Unfortunately, smaller subsegmental sized pulmonary emboli cannot be accurately excluded on this examination. Heart size is borderline enlarged. There is no significant pericardial fluid, thickening or pericardial calcification.  Left-sided pacemaker device in place with lead tips terminating in the right atrial appendage and the right ventricular apex.  A small hiatal hernia. The No pathologically enlarged mediastinal or hilar lymph nodes. There is atherosclerosis of the thoracic aorta, the great vessels of the mediastinum and the coronary arteries, including calcified atherosclerotic plaque in the left anterior descending and right coronary arteries.  Lungs/Pleura: Trace right and small left pleural effusions are simple in appearance layering dependently.  A small area of consolidative air space disease in the posterior aspect of the right lower lobe.  Multifocal other areas of ground-glass attenuation and extensive interlobular septal thickening noted throughout the lungs bilaterally, as well as peribronchovascular interstitial thickening, favored to represent a manifestation of pulmonary edema.  Laxity of the trachea and mainstem bronchi bilaterally which appears to change with respiration, compatible with tracheobronchial malacia.  Musculoskeletal: There are no aggressive appearing lytic  or blastic lesions noted in the visualized portions of the skeleton.   Review of the MIP images confirms the above findings.  IMPRESSION:  1.  Despite the limitations of this examination, there is no evidence to suggest clinically relevant central, lobar or segmental sized pulmonary embolism. 2.  The appearance of the chest is most compatible with mild congestive heart failure. 3.  More confluent consolidative airspace disease in the  posterior aspect of the right lower lobe [place] reflects sequelae of mild aspiration or a developing site of infection. 4. Atherosclerosis, including two-vessel coronary artery disease. 5.  Tracheobronchial malacia. 6.  Additional incidental findings, as above.  CT ABDOMEN AND PELVIS  Findings:  Abdomen/Pelvis:  Image 58 of series 8 demonstrates a 7 mm calculus in the distal third of the left ureter shortly before the left ureterovesicular junction.  This is associated with mild to moderate left-sided hydroureteronephrosis extensive perinephric stranding, compatible with obstruction.  The extrarenal pelvis is noted on the right side (normal anatomical variant).  5.1 x 3.4 x 4.0 cm mass in the right adrenal gland has internal fatty attenuation, most compatible with a large adrenal myelolipoma. There is also a 3.2 x 2.5 x 2.6 cm left adrenal mass which is indeterminate.  This mass measures 94 HU on the portal venous phase imaging, and 58 HU on the 3-minute post contrast delayed imaging, for a relative washout of approximately 39.4% (lesions with relative washout of less than 40% are considered indeterminate, however, that is based on a 15-minute delay).  A subcentimeter low attenuation lesion in segment three of the liver is too small to definitively characterize, but statistically likely a small cyst. Minimal intrahepatic biliary ductal dilatation.  Common bile duct is normal in caliber.  The appearance of the gallbladder and spleen is unremarkable.  A few pancreatic calcifications Olivencia be related to prior episodes of pancreatitis.  Small volume of perihepatic ascites.  No pneumoperitoneum.  No pathologic distension of small bowel.  Numerous reactive sized retroperitoneal lymph nodes are noted, but no definite pathologically enlarged abdominal or pelvic lymph nodes are identified on today's examination.  Uterus and ovaries are atrophic, but otherwise unremarkable in appearance.  Musculoskeletal: Old healed fractures of  the parasymphyseal aspect of the right pelvis involving both superior and inferior pubic rami are noted. There are no aggressive appearing lytic or blastic lesions noted in the visualized portions of the skeleton.  Review of the MIP images confirms the above findings.  IMPRESSION: 1.  7 mm partially obstructive calculus in the distal third of the left ureter with mild to moderate left-sided hydroureteronephrosis and extensive left-sided perinephric stranding.  2.  5.1 x 3.4 x 4.0 cm fatty attenuation mass in the right adrenal gland is compatible with a large myelolipoma. 3.  In addition, there is a 2.5 x 3.2 x 2.6 cm mass in the left adrenal gland which by washout characteristics almost certainly represents an adenoma. If for some clinical reason there is concern for metastatic lesion or primary adrenal malignancy, correlation with noncontrast CT of the abdomen could be obtained (the patient has a pacemaker device and is not a candidate for MRI examination). 4.  Atherosclerosis. 5.  Minimal intrahepatic biliary ductal dilatation without evidence of common bile duct or pancreatic duct dilatation.  This finding is of uncertain etiology and significance, but Speigner be chronic in this patient.  Correlation with liver function tests is suggested. 6.  Small volume of perihepatic ascites.   Original Report Authenticated By: Trudie Reed, M.D.  US Renal Port  07/06/2012   *RADIOLOGY REPORT*  Clinical Data: Left ureteral calculus causing mild to moderate left hydronephrosis on an abdomen and pelvis CT earlier today.  RENAL/URINARY TRACT ULTRASOUND COMPLETE  Comparison:  Abdomen pelvis CT obtained earlier today.  Findings:  Right Kidney:  Normal, measuring 10.9 cm in length.  Previously demonstrated prominent extrarenal pelvis.  Left Kidney:  Mild to moderate dilatation of the left renal collecting system with possible mild improvement.  Otherwise, normal with fetal lobulations, measuring 11.9 cm in length.  Bladder:   Nondistended with a Foley catheter in place.  Multiple gallstones are noted in the gallbladder measuring up to 1.8 cm in maximum diameter each.  Unable to determine if the patient was focally tender over the gallbladder due to medication. No gallbladder wall thickening or pericholecystic fluid.  Normal caliber common duct measuring 3.9 mm in diameter proximally.  Poorly defined rounded echogenic mass superior to the right kidney, corresponding to the large adrenal myelolipoma seen on the CT.  IMPRESSION:  1.  Mild to moderate left hydronephrosis with possible mild improvement (difficult to directly compare different modalities). 2.  Cholelithiasis without evidence of cholecystitis. 3.  Previously demonstrated large right adrenal myelolipoma.   Original Report Authenticated By: Beckie Salts, M.D.   Dg Chest Port 1 View  07/07/2012   *RADIOLOGY REPORT*  Clinical Data: Evaluate for pulmonary edema  PORTABLE CHEST - 1 VIEW  Comparison: Recent chest CT 07/06/2012; prior chest x-ray 06/04/2012  Findings: Stable position of left subclavian approach cardiac rhythm maintenance device with leads projecting over the right atrium and right ventricle.  Inspiratory volumes are slightly low and there is trace bibasilar subsegmental atelectasis.  Mild pulmonary vascular congestion without overt edema.  Stable cardiac and mediastinal contours with enlargement of the bilateral main pulmonary arteries.  Atherosclerotic calcifications noted in the transverse aorta.  No acute osseous abnormality.  IMPRESSION:  1.  Decreased inspiratory volumes with mild bibasilar atelectasis. 2.  Mild pulmonary vascular congestion without overt edema   Original Report Authenticated By: Malachy Moan, M.D.   Dg Abd Acute W/chest  07/06/2012   *RADIOLOGY REPORT*  Clinical Data: Abdominal pain  ACUTE ABDOMEN SERIES (ABDOMEN 2 VIEW & CHEST 1 VIEW)  Comparison: Chest 06/04/2012  Findings: Increased lung markings diffusely and bilaterally compared to  the prior study.  This Wirick be due to heart failure and vascular congestion.  Acute pulmonary infection diffusely is also possible.  Negative for lumbar infiltrate.  Pacemaker in good position.  No effusion.  Abdomen:  Nonobstructive bowel gas pattern.  No free air.  No dilated bowel loops.  No renal calculi.  IMPRESSION: Prominent lung markings diffusely and bilaterally suggestive of fluid overload however diffuse infection also possible  Nonobstructive bowel gas pattern.   Original Report Authenticated By: Janeece Riggers, M.D.    ASSESSMENT / PLAN:  PULMONARY  Recent Labs Lab 07/06/12 1311  PHART 7.379  PCO2ART 42.5  PO2ART 60.0*  HCO3 25.1*  TCO2 26  O2SAT 90.0    A: Hypoxic Respiratory Failure - neg CTA for PE.  Minimal volume up.  Hx of living with smoker.  Questionable RLL Consolidation - noted on CT Trace Bilateral Pleural Effusions   -07/08/2012: Maintaining respiratory status on 3L nasal cannula. No resp distress  P:   -oxygen to support sats 90-95% - She will be low-moderate risk for pulmonary consultations following general anesthesia procedure this is on account of her frailty, age, recent infection and mild hypoxemia. Normal renal function is  protective   CARDIOVASCULAR  Recent Labs Lab 07/06/12 1155  PROBNP 2584.0*      Recent Labs Lab 07/06/12 1028 07/07/12 0810 07/07/12 1535 07/08/12 0003  TROPONINI <0.30 1.33* 1.45* 0.69*    A:  Atrial Fibrillation w RVR- hx of, s/p pacemaker on flecanide Hypotension - likely med related / volume with presumed urosepsis.  Hx of florinef use with PCP [facility] - 0 07/08/2012: Converted to sinus and is on oral drugs  P:  -Cardiology Consult -Oral Cardizem, oral Coreg, oral Lasix and oral flecainide; all home medications -home Eliquis per cardiology consultation - - Oral Lasix per cardiology -  Saline lock RENAL  Recent Labs Lab 07/06/12 1028 07/06/12 2210 07/07/12 0340 07/07/12 0810 07/07/12 1535 07/08/12 0418   NA 135 136 136  --  139 139  K 3.0* 2.9* 3.0*  --  3.8 3.4*  CL 98 95* 96  --  99 100  CO2 26 27 28   --  28 29  GLUCOSE 237* 283* 240*  --  302* 185*  BUN 21 16 17   --  21 24*  CREATININE 0.86 0.71 0.67  --  0.72 0.68  CALCIUM 9.2 9.0 8.8  --  9.3 10.0  MG  --   --   --  1.9  --  1.9  PHOS  --   --   --   --   --  2.2*   Intake/Output     06/14 0701 - 06/15 0700 06/15 0701 - 06/16 0700   P.O. 240    I.V. (mL/kg) 413.3 (7.1)    IV Piggyback 112.5    Total Intake(mL/kg) 765.8 (13.1)    Urine (mL/kg/hr) 2100 (1.5) 350 (1.7)   Total Output 2100 350   Net -1334.2 -350          A:   L Renal Calclui - with associated hydronephrosis At Risk ATN - s/p contrast dye for CTA chest / abd.  Received 500 ml NS in ER Hypokalemia   -  07/07/2012: Making urine and creatinine stable - 07/08/2012: Renal function is stable. Urology planning a repeat renal ultrasound. Mild hypophosphatemia being replaced  P:   -Saline lock -Await repeat renal ultrasound third urology - Intervention for the stone depending on repeat ultrasound and urology opinion  GASTROINTESTINAL  Recent Labs Lab 07/06/12 1028  AST 28  ALT 52*  ALKPHOS 95  BILITOT 1.0  PROT 6.9  ALBUMIN 3.5  INR 1.41    A:  Nutrion. P:   -clear liquid diet and advance as tolerated under nursing supervision   HEMATOLOGIC  Recent Labs Lab 07/06/12 1028 07/07/12 0340 07/08/12 0003  HGB 12.4 12.5 13.3  HCT 35.1* 33.8* 36.0  WBC 13.1* 19.2* 23.9*  PLT 103* 115* 130*    Recent Labs Lab 07/06/12 1028  INR 1.41    A:   Thrombocytopenia - no evidence of acute bleeding  P:  -monitor platelets, concern for urosepsis  INFECTIOUS  Recent Labs Lab 07/06/12 1049 07/07/12 0840 07/08/12 0418  LATICACIDVEN 1.60  --   --   PROCALCITON  --  6.99 4.86    Recent Labs Lab 07/07/12 0840 07/08/12 0418  PROCALCITON 6.99 4.86    Recent Labs Lab 07/06/12 1028 07/07/12 0340 07/08/12 0003  WBC 13.1* 19.2* 23.9*     A:   L Obstructing Renal Calculi RLL Consolidative Airspace Disease  Escherichia coli urinary tract infection and gram-negative septicemia  - 07/08/2012: Continues to be afebrile. Never needed pressors. Pro-CAL  trending down but still elevated P:   -treat empirically for urine & pulmonary source infection -assess PCT algorithm - Continue Zosyn; narrow depending on sensitivities  ENDOCRINE CBG (last 3)   Recent Labs  07/07/12 2335 07/08/12 0345 07/08/12 0703  GLUCAP 209* 174* 212*     A:   Diet Controlled DM Hyperglycemia  Vitamin D deficiency    P:   -SSI - Home megadose vitamin D. once every week on Wednesdays   NEUROLOGIC A:   Depression / Anxiety   P: -r continue home Zoloft and home lorazepam   Global 07/08/2012: No family at bedside x2 days. Moved to the telemetry floor. Last triad hospitalist assume primary service. Discussed with urology    Dr. Kalman Shan, M.D., [facility].C.P Pulmonary and Critical Care Medicine Staff Physician [facility] [place] [facility] Pager: [phone], If no answer or between  15:00h - 7:00h: call [phone]  07/08/2012 10:36 AM

## 2012-07-08 NOTE — Progress Notes (Signed)
Patient ID: Jo [name], female   DOB: Dec 14, 1917, 79 y.o.   MRN: [id number]  Renal ultrasound shows resolution of the hydronephrosis. Suggest: conservative management of the ureteral calculus.

## 2012-07-08 NOTE — Progress Notes (Signed)
0400, patient converted from normal sinus rhythm to atria fib/flutter. With RVR. cardiologist on call notified, after confirmation with EKG. Order for a one time dose PO of Cardizem.

## 2012-07-09 ENCOUNTER — Inpatient Hospital Stay (HOSPITAL_COMMUNITY): Payer: Medicare Other

## 2012-07-09 DIAGNOSIS — J690 Pneumonitis due to inhalation of food and vomit: Secondary | ICD-10-CM | POA: Diagnosis not present

## 2012-07-09 DIAGNOSIS — R0602 Shortness of breath: Secondary | ICD-10-CM | POA: Diagnosis not present

## 2012-07-09 DIAGNOSIS — I4891 Unspecified atrial fibrillation: Secondary | ICD-10-CM | POA: Diagnosis not present

## 2012-07-09 DIAGNOSIS — R5381 Other malaise: Secondary | ICD-10-CM | POA: Diagnosis not present

## 2012-07-09 LAB — CULTURE, BLOOD (ROUTINE X 2)

## 2012-07-09 LAB — BASIC METABOLIC PANEL
CO2: 29 mEq/L (ref 19–32)
Chloride: 96 mEq/L (ref 96–112)
Glucose, Bld: 170 mg/dL — ABNORMAL HIGH (ref 70–99)
Sodium: 136 mEq/L (ref 135–145)

## 2012-07-09 LAB — GLUCOSE, CAPILLARY
Glucose-Capillary: 160 mg/dL — ABNORMAL HIGH (ref 70–99)
Glucose-Capillary: 178 mg/dL — ABNORMAL HIGH (ref 70–99)
Glucose-Capillary: 196 mg/dL — ABNORMAL HIGH (ref 70–99)
Glucose-Capillary: 284 mg/dL — ABNORMAL HIGH (ref 70–99)

## 2012-07-09 LAB — URINE CULTURE
Colony Count: >=100000
Special Requests: NORMAL

## 2012-07-09 LAB — CBC
HCT: 38.3 % (ref 36.0–46.0)
MCH: 31.8 pg (ref 26.0–34.0)
MCV: 90.3 fL (ref 78.0–100.0)
Platelets: 163 10*3/uL (ref 150–400)
RBC: 4.24 MIL/uL (ref 3.87–5.11)
WBC: 16.6 10*3/uL — ABNORMAL HIGH (ref 4.0–10.5)

## 2012-07-09 LAB — PHOSPHORUS: Phosphorus: 3.1 mg/dL (ref 2.3–4.6)

## 2012-07-09 LAB — PROCALCITONIN: Procalcitonin: 2.83 ng/mL

## 2012-07-09 LAB — MAGNESIUM: Magnesium: 1.6 mg/dL (ref 1.5–2.5)

## 2012-07-09 MED ORDER — ACETAMINOPHEN 500 MG PO TABS
500.0000 mg | ORAL_TABLET | Freq: Four times a day (QID) | ORAL | Status: DC | PRN
  Administered 2012-07-09 – 2012-07-10 (×2): 500 mg via ORAL
  Filled 2012-07-09 (×3): qty 1

## 2012-07-09 MED ORDER — FLECAINIDE ACETATE 100 MG PO TABS
100.0000 mg | ORAL_TABLET | Freq: Two times a day (BID) | ORAL | Status: DC
  Administered 2012-07-09 – 2012-07-11 (×4): 100 mg via ORAL
  Filled 2012-07-09 (×5): qty 1

## 2012-07-09 MED ORDER — POTASSIUM CHLORIDE CRYS ER 20 MEQ PO TBCR
40.0000 meq | EXTENDED_RELEASE_TABLET | Freq: Once | ORAL | Status: AC
  Administered 2012-07-09: 40 meq via ORAL
  Filled 2012-07-09: qty 2

## 2012-07-09 MED ORDER — DEXTROSE 5 % IV SOLN
1.0000 g | INTRAVENOUS | Status: DC
  Administered 2012-07-09: 1 g via INTRAVENOUS
  Filled 2012-07-09 (×2): qty 10

## 2012-07-09 MED ORDER — DEXTROSE 5 % IV SOLN
2.0000 g | INTRAVENOUS | Status: DC
  Filled 2012-07-09: qty 2

## 2012-07-09 NOTE — Evaluation (Signed)
Physical Therapy Evaluation Patient Details Name: Jo [name] MRN: [id number] DOB: April 20, 1917 Today's Date: 07/09/2012 Time: 7829-5621 PT Time Calculation (min): 37 min  PT Assessment / Plan / Recommendation Clinical Impression  79 yo female admitted with abdominal pain, found to have kidney stone which led to pyelonephritis; Presents with decr functional mobility; will benefit from PT to maximize independence and safety with mobility and enable safe dc home    PT Assessment  Patient needs continued PT services    Follow Up Recommendations  Home health PT;Supervision/Assistance - 24 hour    Does the patient have the potential to tolerate intense rehabilitation      Barriers to Discharge        Equipment Recommendations  Rolling walker with 5" wheels (3in1)    Recommendations for Other Services OT consult   Frequency Min 3X/week    Precautions / Restrictions Precautions Precautions: Fall   Pertinent Vitals/Pain no apparent distress       Mobility  Bed Mobility Bed Mobility: Supine to Sit Supine to Sit: 4: Min guard;With rails;HOB elevated Details for Bed Mobility Assistance: Overall smooth transition Transfers Transfers: Sit to Stand;Stand to Sit Sit to Stand: 4: Min guard;From bed;With upper extremity assist Stand to Sit: 4: Min guard;To chair/3-in-1;With upper extremity assist Details for Transfer Assistance: Noted pt with heavy dependence on UE support with transfers; Minguard assist for safety Ambulation/Gait Ambulation/Gait Assistance: 4: Min guard;4: Min assist Ambulation Distance (Feet): 35 Feet Assistive device: Rolling walker Ambulation/Gait Assistance Details: Cues for posture and RW proximity Gait Pattern: Decreased stride length    Exercises     PT Diagnosis: Difficulty walking;Generalized weakness  PT Problem List: Decreased strength;Decreased activity tolerance;Decreased balance;Decreased mobility;Decreased knowledge of use of DME PT Treatment  Interventions: DME instruction;Gait training;Stair training;Functional mobility training;Therapeutic activities;Therapeutic exercise;Balance training;Patient/family education   PT Goals Acute Rehab PT Goals PT Goal Formulation: With patient Time For Goal Achievement: 07/23/12 Potential to Achieve Goals: Good Pt will go Supine/Side to Sit: with modified independence PT Goal: Supine/Side to Sit - Progress: Goal set today Pt will go Sit to Supine/Side: with modified independence PT Goal: Sit to Supine/Side - Progress: Goal set today Pt will go Sit to Stand: with modified independence PT Goal: Sit to Stand - Progress: Goal set today Pt will go Stand to Sit: with modified independence PT Goal: Stand to Sit - Progress: Goal set today Pt will Ambulate: >150 feet;with modified independence;with least restrictive assistive device;with rolling walker PT Goal: Ambulate - Progress: Goal set today Pt will Go Up / Down Stairs: 3-5 stairs;with min assist PT Goal: Up/Down Stairs - Progress: Goal set today  Visit Information  Last PT Received On: 07/09/12 Assistance Needed: +1    Subjective Data  Subjective: Agreeable to amb; very independent Patient Stated Goal: feel better, back home, independent   Prior Functioning  Home Living Lives With: Alone Available Help at Discharge: Family;Available 24 hours/day (Daughter can stay with pt initially, and neighbors check in on pt daily) Type of Home: House Home Access: Stairs to enter [employer] of Steps: 3 Entrance Stairs-Rails: None Home Layout: One level Bathroom Shower/Tub: Tub/shower unit Home Adaptive Equipment: Tub transfer bench;Straight cane Prior Function Level of Independence: Independent with assistive device(s) Able to Take Stairs?: Yes Driving: Yes Vocation: Retired Comments: enjoys reading Musician: HOH    Cognition  Cognition Arousal/Alertness: Awake/alert Behavior During Therapy: WFL for tasks  assessed/performed Overall Cognitive Status: Within Functional Limits for tasks assessed    Extremity/Trunk Assessment Right  Upper Extremity Assessment RUE ROM/Strength/Tone: [facility] for tasks assessed Left Upper Extremity Assessment LUE ROM/Strength/Tone: WFL for tasks assessed Right Lower Extremity Assessment RLE ROM/Strength/Tone: Deficits RLE ROM/Strength/Tone Deficits: generally weak Left Lower Extremity Assessment LLE ROM/Strength/Tone: Deficits LLE ROM/Strength/Tone Deficits: Generally weak Trunk Assessment Trunk Assessment: Normal   Balance    End of Session PT - End of Session Equipment Utilized During Treatment: Gait belt Activity Tolerance: Patient tolerated treatment well (though fatigued at end of walk) Patient left: in chair;with call bell/phone within reach Nurse Communication: Mobility status  GP     Van Clines [facility],  [phone]  07/09/2012, 4:51 PM

## 2012-07-09 NOTE — Progress Notes (Signed)
Patient ID: Jo [name], female   DOB: Nov 14, 1917, 79 y.o.   MRN: [id number]  Renal ultrasound shows no hydronephrosis. Continue conservative management of the left distal ureteral stone.  Will follow her as outpatient.

## 2012-07-09 NOTE — Progress Notes (Signed)
Patient ID: Jo [name], female   DOB: November 17, 1917, 79 y.o.   MRN: [id number]    SUBJECTIVE: Sinus currently but still has runs of atrial fibrillation with RVR.  Denies dyspnea.   Marland Kitchen apixaban  2.5 mg Oral BID  . atorvastatin  10 mg Oral Custom  . carvedilol  3.125 mg Oral BID WC  . cefTRIAXone (ROCEPHIN)  IV  1 g Intravenous Q24H  . diltiazem  240 mg Oral Daily  . estradiol  1 Applicatorful Vaginal Q M,W,F  . flecainide  50 mg Oral Q12H  . furosemide  40 mg Oral Daily  . insulin aspart  0-9 Units Subcutaneous Q4H  . potassium chloride  40 mEq Oral Once  . senna-docusate  2 tablet Oral BID  . sertraline  50 mg Oral q morning - 10a  . [START ON 07/11/2012] Vitamin D (Ergocalciferol)  50,000 Units Oral Q Wed      Filed Vitals:   07/08/12 1417 07/08/12 2000 07/08/12 2110 07/09/12 0500  BP: 135/51 98/49 155/52 122/58  Pulse: 64 59 65 72  Temp: 97.3 F (36.3 C) 97.5 F (36.4 C) 98.3 F (36.8 C) 97.6 F (36.4 C)  TempSrc: Oral  Oral   Resp: 18 16 18 16   Height: 5\' 2"  (1.575 m)     Weight: 124 lb 12.5 oz (56.6 kg)   119 lb 11.4 oz (54.3 kg)  SpO2: 95% 97% 96% 96%    Intake/Output Summary (Last 24 hours) at 07/09/12 1117 Last data filed at 07/09/12 0500  Gross per 24 hour  Intake    900 ml  Output   1736 ml  Net   -836 ml    LABS: Basic Metabolic Panel:  Recent Labs  40/98/11 0418 07/09/12 0410  NA 139 136  K 3.4* 3.3*  CL 100 96  CO2 29 29  GLUCOSE 185* 170*  BUN 24* 24*  CREATININE 0.68 0.72  CALCIUM 10.0 9.6  MG 1.9 1.6  PHOS 2.2* 3.1   Liver Function Tests: No results found for this basename: AST, ALT, ALKPHOS, BILITOT, PROT, ALBUMIN,  in the last 72 hours No results found for this basename: LIPASE, AMYLASE,  in the last 72 hours CBC:  Recent Labs  07/08/12 0003 07/09/12 0410  WBC 23.9* 16.6*  HGB 13.3 13.5  HCT 36.0 38.3  MCV 89.1 90.3  PLT 130* 163   Cardiac Enzymes:  Recent Labs  07/07/12 0810 07/07/12 1535 07/08/12 0003  TROPONINI 1.33*  1.45* 0.69*   BNP: No components found with this basename: POCBNP,  D-Dimer: No results found for this basename: DDIMER,  in the last 72 hours Hemoglobin A1C: No results found for this basename: HGBA1C,  in the last 72 hours Fasting Lipid Panel: No results found for this basename: CHOL, HDL, LDLCALC, TRIG, CHOLHDL, LDLDIRECT,  in the last 72 hours Thyroid Function Tests: No results found for this basename: TSH, T4TOTAL, FREET3, T3FREE, THYROIDAB,  in the last 72 hours Anemia Panel: No results found for this basename: VITAMINB12, FOLATE, FERRITIN, TIBC, IRON, RETICCTPCT,  in the last 72 hours  RADIOLOGY: Ct Angio Chest Pe W/cm &/or Wo Cm  07/06/2012   *RADIOLOGY REPORT*  Clinical Data:  Shortness of breath, abdominal pain, nausea.  CT ANGIOGRAPHY CHEST CT ABDOMEN AND PELVIS WITH CONTRAST  Technique:  Multidetector CT imaging of the chest was performed using the standard protocol during bolus administration of intravenous contrast.  Multiplanar CT image reconstructions including MIPs were obtained to evaluate the vascular anatomy. Multidetector  CT imaging of the abdomen and pelvis was performed using the standard protocol during bolus administration of intravenous contrast.  Contrast: OMNIPAQUE IOHEXOL 350 MG/ML SOLN  Comparison:  CT of the pelvis of 02/12/2007.  CTA CHEST  Findings:  Mediastinum: Study is slightly limited by respiratory motion.  With these limitations in mind, there is no evidence of clinically relevant central, lobar or segmental sized pulmonary embolism. Unfortunately, smaller subsegmental sized pulmonary emboli cannot be accurately excluded on this examination. Heart size is borderline enlarged. There is no significant pericardial fluid, thickening or pericardial calcification.  Left-sided pacemaker device in place with lead tips terminating in the right atrial appendage and the right ventricular apex.  A small hiatal hernia. The No pathologically enlarged mediastinal or  hilar lymph nodes. There is atherosclerosis of the thoracic aorta, the great vessels of the mediastinum and the coronary arteries, including calcified atherosclerotic plaque in the left anterior descending and right coronary arteries.  Lungs/Pleura: Trace right and small left pleural effusions are simple in appearance layering dependently.  A small area of consolidative air space disease in the posterior aspect of the right lower lobe.  Multifocal other areas of ground-glass attenuation and extensive interlobular septal thickening noted throughout the lungs bilaterally, as well as peribronchovascular interstitial thickening, favored to represent a manifestation of pulmonary edema.  Laxity of the trachea and mainstem bronchi bilaterally which appears to change with respiration, compatible with tracheobronchial malacia.  Musculoskeletal: There are no aggressive appearing lytic or blastic lesions noted in the visualized portions of the skeleton.   Review of the MIP images confirms the above findings.  IMPRESSION:  1.  Despite the limitations of this examination, there is no evidence to suggest clinically relevant central, lobar or segmental sized pulmonary embolism. 2.  The appearance of the chest is most compatible with mild congestive heart failure. 3.  More confluent consolidative airspace disease in the posterior aspect of the right lower lobe [place] reflects sequelae of mild aspiration or a developing site of infection. 4. Atherosclerosis, including two-vessel coronary artery disease. 5.  Tracheobronchial malacia. 6.  Additional incidental findings, as above.  CT ABDOMEN AND PELVIS  Findings:  Abdomen/Pelvis:  Image 58 of series 8 demonstrates a 7 mm calculus in the distal third of the left ureter shortly before the left ureterovesicular junction.  This is associated with mild to moderate left-sided hydroureteronephrosis extensive perinephric stranding, compatible with obstruction.  The extrarenal pelvis is noted on  the right side (normal anatomical variant).  5.1 x 3.4 x 4.0 cm mass in the right adrenal gland has internal fatty attenuation, most compatible with a large adrenal myelolipoma. There is also a 3.2 x 2.5 x 2.6 cm left adrenal mass which is indeterminate.  This mass measures 94 HU on the portal venous phase imaging, and 58 HU on the 3-minute post contrast delayed imaging, for a relative washout of approximately 39.4% (lesions with relative washout of less than 40% are considered indeterminate, however, that is based on a 15-minute delay).  A subcentimeter low attenuation lesion in segment three of the liver is too small to definitively characterize, but statistically likely a small cyst. Minimal intrahepatic biliary ductal dilatation.  Common bile duct is normal in caliber.  The appearance of the gallbladder and spleen is unremarkable.  A few pancreatic calcifications Mcfetridge be related to prior episodes of pancreatitis.  Small volume of perihepatic ascites.  No pneumoperitoneum.  No pathologic distension of small bowel.  Numerous reactive sized retroperitoneal lymph nodes are noted,  but no definite pathologically enlarged abdominal or pelvic lymph nodes are identified on today's examination.  Uterus and ovaries are atrophic, but otherwise unremarkable in appearance.  Musculoskeletal: Old healed fractures of the parasymphyseal aspect of the right pelvis involving both superior and inferior pubic rami are noted. There are no aggressive appearing lytic or blastic lesions noted in the visualized portions of the skeleton.  Review of the MIP images confirms the above findings.  IMPRESSION: 1.  7 mm partially obstructive calculus in the distal third of the left ureter with mild to moderate left-sided hydroureteronephrosis and extensive left-sided perinephric stranding.  2.  5.1 x 3.4 x 4.0 cm fatty attenuation mass in the right adrenal gland is compatible with a large myelolipoma. 3.  In addition, there is a 2.5 x 3.2 x 2.6 cm  mass in the left adrenal gland which by washout characteristics almost certainly represents an adenoma. If for some clinical reason there is concern for metastatic lesion or primary adrenal malignancy, correlation with noncontrast CT of the abdomen could be obtained (the patient has a pacemaker device and is not a candidate for MRI examination). 4.  Atherosclerosis. 5.  Minimal intrahepatic biliary ductal dilatation without evidence of common bile duct or pancreatic duct dilatation.  This finding is of uncertain etiology and significance, but Umscheid be chronic in this patient.  Correlation with liver function tests is suggested. 6.  Small volume of perihepatic ascites.   Original Report Authenticated By: Trudie Reed, M.D.   Ct Abdomen Pelvis W Contrast  07/06/2012   *RADIOLOGY REPORT*  Clinical Data:  Shortness of breath, abdominal pain, nausea.  CT ANGIOGRAPHY CHEST CT ABDOMEN AND PELVIS WITH CONTRAST  Technique:  Multidetector CT imaging of the chest was performed using the standard protocol during bolus administration of intravenous contrast.  Multiplanar CT image reconstructions including MIPs were obtained to evaluate the vascular anatomy. Multidetector CT imaging of the abdomen and pelvis was performed using the standard protocol during bolus administration of intravenous contrast.  Contrast: OMNIPAQUE IOHEXOL 350 MG/ML SOLN  Comparison:  CT of the pelvis of 02/12/2007.  CTA CHEST  Findings:  Mediastinum: Study is slightly limited by respiratory motion.  With these limitations in mind, there is no evidence of clinically relevant central, lobar or segmental sized pulmonary embolism. Unfortunately, smaller subsegmental sized pulmonary emboli cannot be accurately excluded on this examination. Heart size is borderline enlarged. There is no significant pericardial fluid, thickening or pericardial calcification.  Left-sided pacemaker device in place with lead tips terminating in the right atrial appendage  and the right ventricular apex.  A small hiatal hernia. The No pathologically enlarged mediastinal or hilar lymph nodes. There is atherosclerosis of the thoracic aorta, the great vessels of the mediastinum and the coronary arteries, including calcified atherosclerotic plaque in the left anterior descending and right coronary arteries.  Lungs/Pleura: Trace right and small left pleural effusions are simple in appearance layering dependently.  A small area of consolidative air space disease in the posterior aspect of the right lower lobe.  Multifocal other areas of ground-glass attenuation and extensive interlobular septal thickening noted throughout the lungs bilaterally, as well as peribronchovascular interstitial thickening, favored to represent a manifestation of pulmonary edema.  Laxity of the trachea and mainstem bronchi bilaterally which appears to change with respiration, compatible with tracheobronchial malacia.  Musculoskeletal: There are no aggressive appearing lytic or blastic lesions noted in the visualized portions of the skeleton.   Review of the MIP images confirms the above findings.  IMPRESSION:  1.  Despite the limitations of this examination, there is no evidence to suggest clinically relevant central, lobar or segmental sized pulmonary embolism. 2.  The appearance of the chest is most compatible with mild congestive heart failure. 3.  More confluent consolidative airspace disease in the posterior aspect of the right lower lobe [place] reflects sequelae of mild aspiration or a developing site of infection. 4. Atherosclerosis, including two-vessel coronary artery disease. 5.  Tracheobronchial malacia. 6.  Additional incidental findings, as above.  CT ABDOMEN AND PELVIS  Findings:  Abdomen/Pelvis:  Image 58 of series 8 demonstrates a 7 mm calculus in the distal third of the left ureter shortly before the left ureterovesicular junction.  This is associated with mild to moderate left-sided  hydroureteronephrosis extensive perinephric stranding, compatible with obstruction.  The extrarenal pelvis is noted on the right side (normal anatomical variant).  5.1 x 3.4 x 4.0 cm mass in the right adrenal gland has internal fatty attenuation, most compatible with a large adrenal myelolipoma. There is also a 3.2 x 2.5 x 2.6 cm left adrenal mass which is indeterminate.  This mass measures 94 HU on the portal venous phase imaging, and 58 HU on the 3-minute post contrast delayed imaging, for a relative washout of approximately 39.4% (lesions with relative washout of less than 40% are considered indeterminate, however, that is based on a 15-minute delay).  A subcentimeter low attenuation lesion in segment three of the liver is too small to definitively characterize, but statistically likely a small cyst. Minimal intrahepatic biliary ductal dilatation.  Common bile duct is normal in caliber.  The appearance of the gallbladder and spleen is unremarkable.  A few pancreatic calcifications Strang be related to prior episodes of pancreatitis.  Small volume of perihepatic ascites.  No pneumoperitoneum.  No pathologic distension of small bowel.  Numerous reactive sized retroperitoneal lymph nodes are noted, but no definite pathologically enlarged abdominal or pelvic lymph nodes are identified on today's examination.  Uterus and ovaries are atrophic, but otherwise unremarkable in appearance.  Musculoskeletal: Old healed fractures of the parasymphyseal aspect of the right pelvis involving both superior and inferior pubic rami are noted. There are no aggressive appearing lytic or blastic lesions noted in the visualized portions of the skeleton.  Review of the MIP images confirms the above findings.  IMPRESSION: 1.  7 mm partially obstructive calculus in the distal third of the left ureter with mild to moderate left-sided hydroureteronephrosis and extensive left-sided perinephric stranding.  2.  5.1 x 3.4 x 4.0 cm fatty attenuation  mass in the right adrenal gland is compatible with a large myelolipoma. 3.  In addition, there is a 2.5 x 3.2 x 2.6 cm mass in the left adrenal gland which by washout characteristics almost certainly represents an adenoma. If for some clinical reason there is concern for metastatic lesion or primary adrenal malignancy, correlation with noncontrast CT of the abdomen could be obtained (the patient has a pacemaker device and is not a candidate for MRI examination). 4.  Atherosclerosis. 5.  Minimal intrahepatic biliary ductal dilatation without evidence of common bile duct or pancreatic duct dilatation.  This finding is of uncertain etiology and significance, but Carvalho be chronic in this patient.  Correlation with liver function tests is suggested. 6.  Small volume of perihepatic ascites.   Original Report Authenticated By: Trudie Reed, M.D.   PHYSICAL EXAM General: NAD Neck: JVP 7-8 cm, no thyromegaly or thyroid nodule.  Lungs: Decreased breath sounds right base. CV:  Nondisplaced PMI.  Heart regular S1/S2, no S3/S4, no murmur.  No peripheral edema.  No carotid bruit.  Normal pedal pulses.  Abdomen: Soft, nontender, no hepatosplenomegaly, no distention.  Neurologic: Alert and oriented x 3.  Psych: Normal affect. Extremities: No clubbing or cyanosis.   TELEMETRY: Reviewed telemetry pt in NSR with runs of atrial fibrillation last night.   ASSESSMENT AND PLAN: 79 yo with history of PAF on flecainide, HTN, and diastolic CHF presented with RLL PNA and possible UTI along with atrial fibrillation/RVR and diastolic CHF exacerbation.  1. Acute/chronic diastolic CHF: In setting of atrial fibrillation with RVR.  Volume status improved, now on po Lasix.  Replete K. 2. Atrial fibrillation: Now back in NSR but has frequent runs atrial fibrillation with RVR.  Hopefully will stop having atrial fibrillation bursts as infection is treated. - Increase flecainide to 100 mg bid (no significant renal dysfunction) to try  to maintain NSR.  - Continue Coreg and diltiazem CD - Continue apixaban.  3. ID: UTI, possible RLL PNA.  E coli in urine.  On antibiotics per primary team.    Marca Ancona 07/09/2012 11:17 AM

## 2012-07-09 NOTE — Progress Notes (Signed)
ANTIBIOTIC CONSULT NOTE - INITIAL  Pharmacy Consult for  Indication: E coli in blood culture  Allergies  Allergen Reactions  . Sulfonamide Derivatives Rash    "it was all over my legs"  . Citalopram Hydrobromide     Pt does not remember reaction    Patient Measurements: Height: 5\' 2"  (157.5 cm) Weight: 119 lb 11.4 oz (54.3 kg) IBW/kg (Calculated) : 50.1   Vital Signs: Temp: 97.6 F (36.4 C) (06/16 0500) BP: 122/58 mmHg (06/16 0500) Pulse Rate: 72 (06/16 0500) Intake/Output from previous day: 06/15 0701 - 06/16 0700 In: 950 [P.O.:400; IV Piggyback:550] Out: 2086 [Urine:2085; Stool:1] Intake/Output from this shift:    Labs:  Recent Labs  07/07/12 0340 07/07/12 1535 07/08/12 0003 07/08/12 0418 07/09/12 0410  WBC 19.2*  --  23.9*  --  16.6*  HGB 12.5  --  13.3  --  13.5  PLT 115*  --  130*  --  163  CREATININE 0.67 0.72  --  0.68 0.72   Estimated Creatinine Clearance: 34.7 ml/min (by C-G formula based on Cr of 0.72). No results found for this basename: VANCOTROUGH, Leodis Binet, VANCORANDOM, GENTTROUGH, GENTPEAK, GENTRANDOM, TOBRATROUGH, TOBRAPEAK, TOBRARND, AMIKACINPEAK, AMIKACINTROU, AMIKACIN,  in the last 72 hours   Microbiology: Recent Results (from the past 720 hour(s))  URINE CULTURE     Status: None   Collection Time    07/06/12 11:30 AM      Result Value Range Status   Specimen Description URINE, CATHETERIZED   Final   Special Requests Normal   Final   Culture  Setup Time 07/06/2012 19:19   Final   Colony Count >=100,000 COLONIES/ML   Final   Culture ESCHERICHIA COLI   Final   Report Status PENDING   Incomplete  CULTURE, BLOOD (ROUTINE X 2)     Status: None   Collection Time    07/06/12 11:55 AM      Result Value Range Status   Specimen Description BLOOD HAND RIGHT   Final   Special Requests BOTTLES DRAWN AEROBIC ONLY   Final   Culture  Setup Time 07/06/2012 16:06   Final   Culture     Final   Value: ESCHERICHIA COLI     Note: SUSCEPTIBILITIES  PERFORMED ON PREVIOUS CULTURE WITHIN THE LAST 5 DAYS.     [id number] Note: Gram Stain Report Called to,Read Back By and Verified With: CYRIL SORCHA 06[address] [facility]   Report Status 07/09/2012 FINAL   Final  CULTURE, BLOOD (ROUTINE X 2)     Status: None   Collection Time    07/06/12 12:00 PM      Result Value Range Status   Specimen Description BLOOD ARM LEFT   Final   Special Requests BOTTLES DRAWN AEROBIC AND ANAEROBIC 10CC   Final   Culture  Setup Time 07/06/2012 16:06   Final   Culture     Final   Value: ESCHERICHIA COLI     [id number] Note: Gram Stain Report Called to,Read Back By and Verified With: CYRIL SORCHA 0640 [facility]   Report Status 07/09/2012 FINAL   Final   Organism ID, Bacteria ESCHERICHIA COLI   Final  MRSA PCR SCREENING     Status: None   Collection Time    07/06/12  8:06 PM      Result Value Range Status   MRSA by PCR NEGATIVE  NEGATIVE Final   Comment:            The GeneXpert MRSA Assay (FDA  approved for NASAL specimens     only), is one component of a     comprehensive MRSA colonization     surveillance program. It is not     intended to diagnose MRSA     infection nor to guide or     monitor treatment for     MRSA infections.    Medical History: Past Medical History  Diagnosis Date  . History of aortic insufficiency   . Anxiety   . Hypertension   . Pacemaker   . PAF (paroxysmal atrial fibrillation)     a. s/p pacemaker. b. s/p Medtronic gen change 07/2011.  . SSS (sick sinus syndrome)   . Type II diabetes mellitus     "controlled w/diet"  . Skin cancer ?1960's    "between breasts"  . Arthritis     "hands; between shoulders; back; feet"  . Panic attacks     "since losing husband 1994"  . Depression   . High risk medication use     on Flecainide  . Chronic anticoagulation   . Osteoporosis     Medications:  Scheduled:  . apixaban  2.5 mg Oral BID  . atorvastatin  10 mg Oral Custom  . carvedilol  3.125 mg Oral BID WC  . cefTRIAXone (ROCEPHIN)  IV   1 g Intravenous Q24H  . diltiazem  240 mg Oral Daily  . estradiol  1 Applicatorful Vaginal Q M,W,F  . flecainide  50 mg Oral Q12H  . furosemide  40 mg Oral Daily  . insulin aspart  0-9 Units Subcutaneous Q4H  . potassium chloride  40 mEq Oral Once  . senna-docusate  2 tablet Oral BID  . sertraline  50 mg Oral q morning - 10a  . [START ON 07/11/2012] Vitamin D (Ergocalciferol)  50,000 Units Oral Q Wed   Assessment: 79 yr old female admitted on 6/13 for abdominal pain and nausea, was on Zosyn day 3 for E coli UTI.  Blood culture positive for E coli, now changing therapy to IV Rocephin.    Goal of Therapy:  Eradication of infection  Plan:  Start Rocephin 1gram IV q24hrs. F/u renal func, clinical status.   Wendie Simmer, PharmD, BCPS Clinical Pharmacist  Pager: [phone]    Marylouise Stacks 07/09/2012,11:08 AM

## 2012-07-09 NOTE — Progress Notes (Signed)
PROGRESS NOTE  Jo [name] AVW:[id number] DOB: 05-22-1917 DOA: 07/06/2012 PCP: Ezequiel Kayser, MD  Brief narrative: 79 y/o F with a PMH of HTN, PAF s/p pacemaker on Eliquis, DM (diet controlled) who presented to [facility] on 6/13 with four day history of abdominal pain & nausea. While in ER she was noted to have room air saturations in 80's and tachypnea. CTA Chest negative for PE, suggestive of mild CHF, and RLL consolidative disease concerning for infection. Abd CT demonstrated L renal obstructing calculi with hydronephrosis, extensive perinephric stranding.  Urology Dr. Brunilda Payor was consulted due to appearanc eof [place] but this has since resolved.  Lactic acid 1.6. Later in ER course, she had Afib with RVR and Rx'd with Cardizem / lasix, subsequent hypotension. UA positive for nitrites, many bacteria, 0-2 WBC, greater than 1000k glucose. PCCM consulted for SIRS / Hypotension.  Patient denies shortness of breath, abd pain, nausea, pain with urination, abnormal BM, chest pain, chest pain with inspiration. Last BM 6/12  Past medical history-As per Problem list Chart reviewed as below- Admission 06/04/12 for orthostasis Admission for 07/21/11 for PAF/Tachy-brady Admission 03/16/06 for Medtronic pacer placement  Consultants:  Urology, Nesi  Cardiology  CULTURES:  UA 6/13>>>pos nitrite, 0-2 wbc, many bacteria, >1000k glucose  BCx2 6/13>>> GNR in BOTH BOTTLES  UC 6/13>>> E COLII  ANTIBIOTICS:  Zosyn 6/13 (urinary / obs & asp)>>>  SIGNIFICANT EVENTS / STUDIES:  6/13 - Admit with obstructing renal stone, hydro. AFib with RVR, Rx'd then hypotensive.  07/07/12: Overnight no events. BP stable.Not on pressors. Not intubated. Getting IVF with KCL. On cardizem gtt, and oral flecanide but currently not on her home Coreg    Subjective  Very HOH.  Feels well but weak.  tol po fairly.  No n/v/cp.  No abd pain No other c/o really   Objective    Interim History: nad   Telemetry: 2:1, 3:1 rate  controlled a afi  Objective: Filed Vitals:   07/08/12 1417 07/08/12 2000 07/08/12 2110 07/09/12 0500  BP: 135/51 98/49 155/52 122/58  Pulse: 64 59 65 72  Temp: 97.3 F (36.3 C) 97.5 F (36.4 C) 98.3 F (36.8 C) 97.6 F (36.4 C)  TempSrc: Oral  Oral   Resp: 18 16 18 16   Height: 5\' 2"  (1.575 m)     Weight: 56.6 kg (124 lb 12.5 oz)   54.3 kg (119 lb 11.4 oz)  SpO2: 95% 97% 96% 96%    Intake/Output Summary (Last 24 hours) at 07/09/12 1009 Last data filed at 07/09/12 0500  Gross per 24 hour  Intake    900 ml  Output   1736 ml  Net   -836 ml    Exam:  General: frail pleasant HOH CF Cardiovascular: s1 s2 irreg rr.  No m Respiratory: clear, no added sound Abdomen: soft, NT/ND Skinnad Neuro grossly intact  Data Reviewed: Basic Metabolic Panel:  Recent Labs Lab 07/06/12 2210 07/07/12 0340 07/07/12 0810 07/07/12 1535 07/08/12 0418 07/09/12 0410  NA 136 136  --  139 139 136  K 2.9* 3.0*  --  3.8 3.4* 3.3*  CL 95* 96  --  99 100 96  CO2 27 28  --  28 29 29   GLUCOSE 283* 240*  --  302* 185* 170*  BUN 16 17  --  21 24* 24*  CREATININE 0.71 0.67  --  0.72 0.68 0.72  CALCIUM 9.0 8.8  --  9.3 10.0 9.6  MG  --   --  1.9  --  1.9 1.6  PHOS  --   --   --   --  2.2* 3.1   Liver Function Tests:  Recent Labs Lab 07/06/12 1028  AST 28  ALT 52*  ALKPHOS 95  BILITOT 1.0  PROT 6.9  ALBUMIN 3.5    Recent Labs Lab 07/06/12 1028  LIPASE 31   No results found for this basename: AMMONIA,  in the last 168 hours CBC:  Recent Labs Lab 07/06/12 1028 07/07/12 0340 07/08/12 0003 07/09/12 0410  WBC 13.1* 19.2* 23.9* 16.6*  NEUTROABS 12.1*  --   --   --   HGB 12.4 12.5 13.3 13.5  HCT 35.1* 33.8* 36.0 38.3  MCV 90.5 88.5 89.1 90.3  PLT 103* 115* 130* 163   Cardiac Enzymes:  Recent Labs Lab 07/06/12 1028 07/07/12 0810 07/07/12 1535 07/08/12 0003  TROPONINI <0.30 1.33* 1.45* 0.69*   BNP: No components found with this basename: POCBNP,  CBG:  Recent  Labs Lab 07/08/12 1642 07/08/12 2011 07/09/12 0052 07/09/12 0532 07/09/12 0745  GLUCAP 229* 213* 160* 178* 218*    Recent Results (from the past 240 hour(s))  URINE CULTURE     Status: None   Collection Time    07/06/12 11:30 AM      Result Value Range Status   Specimen Description URINE, CATHETERIZED   Final   Special Requests Normal   Final   Culture  Setup Time 07/06/2012 19:19   Final   Colony Count >=100,000 COLONIES/ML   Final   Culture ESCHERICHIA COLI   Final   Report Status PENDING   Incomplete  CULTURE, BLOOD (ROUTINE X 2)     Status: None   Collection Time    07/06/12 11:55 AM      Result Value Range Status   Specimen Description BLOOD HAND RIGHT   Final   Special Requests BOTTLES DRAWN AEROBIC ONLY   Final   Culture  Setup Time 07/06/2012 16:06   Final   Culture     Final   Value: ESCHERICHIA COLI     Note: SUSCEPTIBILITIES PERFORMED ON PREVIOUS CULTURE WITHIN THE LAST 5 DAYS.     [id number] Note: Gram Stain Report Called to,Read Back By and Verified With: CYRIL SORCHA 0640 [facility]   Report Status 07/09/2012 FINAL   Final  CULTURE, BLOOD (ROUTINE X 2)     Status: None   Collection Time    07/06/12 12:00 PM      Result Value Range Status   Specimen Description BLOOD ARM LEFT   Final   Special Requests BOTTLES DRAWN AEROBIC AND ANAEROBIC 10CC   Final   Culture  Setup Time 07/06/2012 16:06   Final   Culture     Final   Value: ESCHERICHIA COLI     [id number] Note: Gram Stain Report Called to,Read Back By and Verified With: Lowella Dell 1478 [facility]   Report Status 07/09/2012 FINAL   Final   Organism ID, Bacteria ESCHERICHIA COLI   Final  MRSA PCR SCREENING     Status: None   Collection Time    07/06/12  8:06 PM      Result Value Range Status   MRSA by PCR NEGATIVE  NEGATIVE Final   Comment:            The GeneXpert MRSA Assay (FDA     approved for NASAL specimens     only), is one component of a     comprehensive MRSA colonization  surveillance program.  It is not     intended to diagnose MRSA     infection nor to guide or     monitor treatment for     MRSA infections.     Studies:              All Imaging reviewed and is as per above notation   Scheduled Meds: . apixaban  2.5 mg Oral BID  . atorvastatin  10 mg Oral Custom  . carvedilol  3.125 mg Oral BID WC  . diltiazem  240 mg Oral Daily  . estradiol  1 Applicatorful Vaginal Q M,W,F  . flecainide  50 mg Oral Q12H  . furosemide  40 mg Oral Daily  . insulin aspart  0-9 Units Subcutaneous Q4H  . senna-docusate  2 tablet Oral BID  . sertraline  50 mg Oral q morning - 10a  . [START ON 07/11/2012] Vitamin D (Ergocalciferol)  50,000 Units Oral Q Wed   Continuous Infusions:    Assessment/Plan: 1. Pyelonephritis, Ecoli in setting of nephrolithiasis-Urology thinks conservative management is enough.  Changed Zosyn to rocephin IV-potential transition to PO Antibiotics in 1-2 days 2. ?PNa on Ct chest xray-get 2 view CXR in am to determine resolution or worsening 3. Chronic afib, CHad2Vasc2 score=4-continue Flecainide 50 q 12, Cardizem 240 daily, coreg 3.125-continue Apixiban 2.5 bid 4. SSS-s/p pacemaker-stable currently 5. Hypotension-resolved now.  Continue chronic meds 6. Diet controlled DM-continue SSI.  Sugars 160-218.  Likely no meds needed on d/c 7. Depression-Sertraline 50 mg, ativan 0.5 mg bid 8. Aortic insufficiency   Code Status: Code Family Communication: Spoke with son Waynard Edwards on mobile [phone] Disposition Plan: ? Therapy input   Pleas Koch, MD  [facility] Pager [phone] 07/09/2012, 10:09 AM    LOS: 3 days

## 2012-07-10 ENCOUNTER — Encounter (HOSPITAL_COMMUNITY): Payer: Self-pay | Admitting: General Practice

## 2012-07-10 DIAGNOSIS — I5033 Acute on chronic diastolic (congestive) heart failure: Secondary | ICD-10-CM | POA: Diagnosis not present

## 2012-07-10 DIAGNOSIS — J690 Pneumonitis due to inhalation of food and vomit: Secondary | ICD-10-CM | POA: Diagnosis not present

## 2012-07-10 LAB — COMPREHENSIVE METABOLIC PANEL
AST: 8 U/L (ref 0–37)
Albumin: 2.8 g/dL — ABNORMAL LOW (ref 3.5–5.2)
Alkaline Phosphatase: 63 U/L (ref 39–117)
BUN: 24 mg/dL — ABNORMAL HIGH (ref 6–23)
CO2: 30 mEq/L (ref 19–32)
Chloride: 97 mEq/L (ref 96–112)
Creatinine, Ser: 0.75 mg/dL (ref 0.50–1.10)
GFR calc non Af Amer: 71 mL/min — ABNORMAL LOW (ref >90)
Potassium: 3.7 mEq/L (ref 3.5–5.1)
Total Bilirubin: 1.1 mg/dL (ref 0.3–1.2)

## 2012-07-10 LAB — CBC
HCT: 36 % (ref 36.0–46.0)
MCV: 90.7 fL (ref 78.0–100.0)
RBC: 3.97 MIL/uL (ref 3.87–5.11)
RDW: 13.5 % (ref 11.5–15.5)
WBC: 13 10*3/uL — ABNORMAL HIGH (ref 4.0–10.5)

## 2012-07-10 LAB — GLUCOSE, CAPILLARY
Glucose-Capillary: 165 mg/dL — ABNORMAL HIGH (ref 70–99)
Glucose-Capillary: 230 mg/dL — ABNORMAL HIGH (ref 70–99)
Glucose-Capillary: 190 mg/dL — ABNORMAL HIGH (ref 70–99)

## 2012-07-10 MED ORDER — CEPHALEXIN 500 MG PO CAPS
500.0000 mg | ORAL_CAPSULE | Freq: Two times a day (BID) | ORAL | Status: DC
  Administered 2012-07-10: 500 mg via ORAL
  Filled 2012-07-10 (×2): qty 1

## 2012-07-10 MED ORDER — CEPHALEXIN 500 MG PO CAPS: 500.0000 mg | ORAL_CAPSULE | Freq: Two times a day (BID) | ORAL | Status: DC

## 2012-07-10 MED ORDER — FLECAINIDE ACETATE 100 MG PO TABS: 100.0000 mg | ORAL_TABLET | Freq: Two times a day (BID) | ORAL | Status: DC

## 2012-07-10 MED ORDER — CEFUROXIME AXETIL 500 MG PO TABS: 500.0000 mg | ORAL_TABLET | Freq: Two times a day (BID) | ORAL | Status: DC

## 2012-07-10 MED ORDER — CEFUROXIME AXETIL 500 MG PO TABS
500.0000 mg | ORAL_TABLET | Freq: Two times a day (BID) | ORAL | Status: DC
  Administered 2012-07-10 – 2012-07-11 (×2): 500 mg via ORAL
  Filled 2012-07-10 (×5): qty 1

## 2012-07-10 MED ORDER — FUROSEMIDE 40 MG PO TABS: 40.0000 mg | ORAL_TABLET | Freq: Every day | ORAL | Status: DC

## 2012-07-10 NOTE — Progress Notes (Signed)
SUBJECTIVE:  Feels OK.  No acute SOB or pain   PHYSICAL EXAM Filed Vitals:   07/09/12 2002 07/10/12 0431 07/10/12 0905 07/10/12 1030  BP: 141/60 160/73  184/78  Pulse: 73 70  68  Temp: 97.6 F (36.4 C) 98.6 F (37 C)    TempSrc: Oral Oral    Resp: 16 18    Height:      Weight:  122 lb 12.7 oz (55.7 kg)    SpO2: 96% 96% 92%    General:  No distress Lungs:  Clear Heart:  RRR Abdomen:  Positive bowel sounds, no rebound no guarding Extremities:  No edema  LABS:  Results for orders placed during the hospital encounter of 07/06/12 (from the past 24 hour(s))  GLUCOSE, CAPILLARY     Status: Abnormal   Collection Time    07/09/12 12:42 PM      Result Value Range   Glucose-Capillary 230 (*) 70 - 99 mg/dL  GLUCOSE, CAPILLARY     Status: Abnormal   Collection Time    07/09/12  4:37 PM      Result Value Range   Glucose-Capillary 196 (*) 70 - 99 mg/dL  GLUCOSE, CAPILLARY     Status: Abnormal   Collection Time    07/09/12  8:01 PM      Result Value Range   Glucose-Capillary 259 (*) 70 - 99 mg/dL   Comment 1 Notify RN    GLUCOSE, CAPILLARY     Status: Abnormal   Collection Time    07/10/12 12:05 AM      Result Value Range   Glucose-Capillary 122 (*) 70 - 99 mg/dL   Comment 1 Notify RN    GLUCOSE, CAPILLARY     Status: Abnormal   Collection Time    07/10/12  4:35 AM      Result Value Range   Glucose-Capillary 165 (*) 70 - 99 mg/dL  CBC     Status: Abnormal   Collection Time    07/10/12  5:30 AM      Result Value Range   WBC 13.0 (*) 4.0 - 10.5 K/uL   RBC 3.97  3.87 - 5.11 MIL/uL   Hemoglobin 12.5  12.0 - 15.0 g/dL   HCT 62.1  30.8 - 65.7 %   MCV 90.7  78.0 - 100.0 fL   MCH 31.5  26.0 - 34.0 pg   MCHC 34.7  30.0 - 36.0 g/dL   RDW 84.6  96.2 - 95.2 %   Platelets 187  150 - 400 K/uL  COMPREHENSIVE METABOLIC PANEL     Status: Abnormal   Collection Time    07/10/12  5:30 AM      Result Value Range   Sodium 136  135 - 145 mEq/L   Potassium 3.7  3.5 - 5.1 mEq/L   Chloride 97  96 - 112 mEq/L   CO2 30  19 - 32 mEq/L   Glucose, Bld 187 (*) 70 - 99 mg/dL   BUN 24 (*) 6 - 23 mg/dL   Creatinine, Ser 8.41  0.50 - 1.10 mg/dL   Calcium 9.5  8.4 - 32.4 mg/dL   Total Protein 5.9 (*) 6.0 - 8.3 g/dL   Albumin 2.8 (*) 3.5 - 5.2 g/dL   AST 8  0 - 37 U/L   ALT 16  0 - 35 U/L   Alkaline Phosphatase 63  39 - 117 U/L   Total Bilirubin 1.1  0.3 - 1.2 mg/dL   GFR calc  non Af Amer 71 (*) >90 mL/min   GFR calc Af Amer 82 (*) >90 mL/min  GLUCOSE, CAPILLARY     Status: Abnormal   Collection Time    07/10/12  7:38 AM      Result Value Range   Glucose-Capillary 164 (*) 70 - 99 mg/dL    Intake/Output Summary (Last 24 hours) at 07/10/12 1048 Last data filed at 07/10/12 0908  Gross per 24 hour  Intake    120 ml  Output   1100 ml  Net   -980 ml    ASSESSMENT AND PLAN:  Acute on chronic diastolic HF (heart failure):  She is euvolemic.  No change in therapy is needed.  Atrial fibrillation:  Maintaining NSR.  Continue current therapy.  Elevated troponin:  Plan conservative management.  Troponin has come down.  Please call us with further questions.      Fayrene Fearing [facility] 07/10/2012 10:48 AM

## 2012-07-10 NOTE — Progress Notes (Addendum)
Clinical Social Work Department CLINICAL SOCIAL WORK PLACEMENT NOTE 07/10/2012  Patient:  Jo [name], Jo [name]  Account Number:  [id number] Admit date:  07/06/2012  Clinical Social Worker:  [name], [name]  Date/time:  07/10/2012 11:46 AM  Clinical Social Work is seeking post-discharge placement for this patient at the following level of care:   SKILLED NURSING   (*CSW will update this form in Epic as items are completed)   07/10/2012  Patient/family provided with [facility] Department of Clinical Social Work's list of facilities offering this level of care within the geographic area requested by the patient (or if unable, by the patient's family).  07/10/2012  Patient/family informed of their freedom to choose among providers that offer the needed level of care, that participate in Medicare, Medicaid or managed care program needed by the patient, have an available bed and are willing to accept the patient.  07/10/2012  Patient/family informed of [facility]' ownership interest in [facility], as well as of the fact that they are under no obligation to receive care at this facility.  PASARR submitted to EDS on 07/10/2012 PASARR number received from EDS on 07/10/12  FL2 transmitted to all facilities in geographic area requested by pt/family on  07/10/2012 FL2 transmitted to all facilities within larger geographic area on   Patient informed that his/her managed care company has contracts with or will negotiate with  certain facilities, including the following:     Patient/family informed of bed offers received:  07/10/12 Patient chooses bed at  Physician recommends and patient chooses bed at    Patient to be transferred to  on   Patient to be transferred to facility by   The following physician request were entered in Epic:   Additional Comments: [name], MSW, LCSWA [phone]

## 2012-07-10 NOTE — Progress Notes (Signed)
Physical Therapy Treatment Patient Details Name: Jo [name] MRN: [id number] DOB: Feb 18, 1917 Today's Date: 07/10/2012 Time: 4098-1191 PT Time Calculation (min): 30 min  PT Assessment / Plan / Recommendation Comments on Treatment Session  pt. with decreased mobility and increased assistance today.  Recommend SNF as pt. not safe to d/c home independently at this time.    Follow Up Recommendations  SNF;Supervision/Assistance - 24 hour     Does the patient have the potential to tolerate intense rehabilitation     Barriers to Discharge  decreased caregiver support      Equipment Recommendations  Rolling walker with 5" wheels    Recommendations for Other Services OT consult  Frequency Min 3X/week   Plan Discharge plan needs to be updated;Frequency remains appropriate    Precautions / Restrictions Precautions Precautions: Fall Restrictions Weight Bearing Restrictions: No   Pertinent Vitals/Pain Pt. denies    Mobility  Bed Mobility Bed Mobility: Supine to Sit;Sitting - Scoot to Edge of Bed Supine to Sit: 4: Min guard;With rails;HOB elevated Sitting - Scoot to Edge of Bed: 4: Min guard;With rail Transfers Transfers: Sit to Stand;Stand Pivot Transfers;Stand to Sit Sit to Stand: 4: Min assist;From bed;From chair/3-in-1;With upper extremity assist Stand to Sit: 4: Min guard;To chair/3-in-1;Without upper extremity assist Stand Pivot Transfers: 4: Min assist;With armrests Details for Transfer Assistance: Min guard for safety secondary to generalized weakness per pt reports. Ambulation/Gait Ambulation/Gait Assistance: 4: Min guard Ambulation Distance (Feet): 20 Feet Assistive device: Rolling walker Gait Pattern: Step-to pattern;Decreased step length - right;Decreased step length - left Gait velocity: significantly decreased speed with frequent stopping Stairs: No Wheelchair Mobility Wheelchair Mobility: No    Exercises     PT Diagnosis:    PT Problem List:   PT Treatment  Interventions:     PT Goals Acute Rehab PT Goals PT Goal: Supine/Side to Sit - Progress: Not progressing PT Goal: Sit to Supine/Side - Progress: Not progressing PT Goal: Sit to Stand - Progress: Not progressing PT Goal: Stand to Sit - Progress: Not progressing PT Goal: Ambulate - Progress: Not progressing PT Goal: Up/Down Stairs - Progress: Not progressing  Visit Information  Last PT Received On: 07/10/12 Assistance Needed: +1 PT/OT Co-Evaluation/Treatment: Yes    Subjective Data  Subjective: Agreeable to amb; very independent Patient Stated Goal: feel better, back home, independent   Cognition  Cognition Arousal/Alertness: Awake/alert Behavior During Therapy: WFL for tasks assessed/performed Overall Cognitive Status: Within Functional Limits for tasks assessed    Balance  Balance Balance Assessed: Yes Static Sitting Balance Static Sitting - Balance Support: Feet supported;No upper extremity supported Static Standing Balance Static Standing - Balance Support: Bilateral upper extremity supported;During functional activity  End of Session PT - End of Session Equipment Utilized During Treatment: Gait belt;Oxygen Activity Tolerance: Patient limited by fatigue Patient left: in chair;with call bell/phone within reach Nurse Communication: Mobility status   GP     Feltis, Nicki Reaper 07/10/2012, 9:31 AM Nicki Reaper. Feltis, PT, DPT [phone]

## 2012-07-10 NOTE — Progress Notes (Signed)
Clinical Social Work Department BRIEF PSYCHOSOCIAL ASSESSMENT 07/10/2012  Patient:  Jo [name], Jo [name]     Account Number:  [id number]     Admit date:  07/06/2012  Clinical Social Worker:  Lourdes Sledge  Date/Time:  07/10/2012 10:43 AM  Referred by:  Care Management  Date Referred:  07/10/2012 Referred for  SNF Placement   Other Referral:   Interview type:  Other - See comment Other interview type:   CSW informed by [facility] that pt requested SNF placement at [facility].    PSYCHOSOCIAL DATA Living Status:  ALONE Admitted from facility:   Level of care:   Primary support name:  Gerome Sam Primary support relationship to patient:  CHILD, ADULT Degree of support available:   Pt states she does not have 24hr care at home.    CURRENT CONCERNS Current Concerns  Post-Acute Placement   Other Concerns:    SOCIAL WORK ASSESSMENT / PLAN CSW informed pt ready for dc home today however PT now recommending SNF placement.    CSW informed by [facility] that pt agreeable to SNF placement in [facility]. CSW confirmed information with pt who is agreeable to SNF placement.    CSW to do a SNF search.   Assessment/plan status:  Psychosocial Support/Ongoing Assessment of Needs Other assessment/ plan:   Information/referral to community resources:   CSW to provide a SNF list.    PATIENT'S/FAMILY'S RESPONSE TO PLAN OF CARE: Pt presents alert and oriented and agreeable to SNF placement.       [name], MSW, [name] [phone]

## 2012-07-10 NOTE — Discharge Summary (Addendum)
Physician Discharge Summary  Jo [name] ZOX:[id number] DOB: 1917/12/01 DOA: 07/06/2012  PCP: Ezequiel Kayser, MD  Admit date: 07/06/2012 Discharge date: 07/10/2012  Time spent: 40 minutes  Recommendations for Outpatient Follow-up:  1. Patient will need complete course of by mouth Ceftin finishing on 07/19/2012 2. No evidences of pneumonia 3. To continue cardiac meds and dosages recommended as per below-flecainide has been increased this hospital stay 4. Will need home health physical therapy occupational therapy 5. Recommend outpatient followup with  Discharge Diagnoses:  Active Problems:   Acute on chronic diastolic HF (heart failure)   Gram negative septicemia   Discharge Condition: fair  Diet recommendation: heart healthy  Filed Weights   07/08/12 1417 07/09/12 0500 07/10/12 0431  Weight: 56.6 kg (124 lb 12.5 oz) 54.3 kg (119 lb 11.4 oz) 55.7 kg (122 lb 12.7 oz)    History of present illness:  79 y/o F with a PMH of HTN, PAF s/p pacemaker on Eliquis, DM (diet controlled) who presented to [facility] ER on 6/13 with four day history of abdominal pain & nausea. While in ER she was noted to have room air saturations in 80's and tachypnea. CTA Chest negative for PE, suggestive of mild CHF, and RLL consolidative disease concerning for infection. Abd CT demonstrated L renal obstructing calculi with hydronephrosis, extensive perinephric stranding. Urology Dr. Brunilda Payor was consulted due to appearanc eof [place] but this has since resolved. Lactic acid 1.6. Later in ER course, she had Afib with RVR and Rx'd with Cardizem / lasix, subsequent hypotension. UA positive for nitrites, many bacteria, 0-2 WBC, greater than 1000k glucose. PCCM consulted for SIRS / Hypotension.  Transferred to [facility] service 6/16   Hospital Course:  Assessment/Plan:  1. Pyelonephritis, Ecoli in setting of nephrolithiasis-Urology thinks conservative management is enough-she had final ultrasound performed on 6-16 which showed  no hydronephrosis. Changed Zosyn to rocephin IV on 6/16 and then to PO Keflex 6/17 to complete a full course of antibiotics 6/26 2. ?PNa on Ct chest xray-get 2 view CXR in am to determine resolution or worsening 3. Chronic afib, CHad2Vasc2 score=4-continue Flecainide 50 q 12, Cardizem 240 daily, coreg 3.125-continue Apixiban 2.5 bid 4. SSS-s/p pacemaker-stable currently 5. Hypotension-resolved now. Continue chronic meds 6. Diet controlled DM-continue SSI. Sugars 160-218. Likely no meds needed on d/c 7. Depression-Sertraline 50 mg, ativan 0.5 mg bid 8. Aortic insufficiency-stable 9. Debility-PT recommended 24/7 supervision at home and have spoken with family regarding placement which family is amenable to as patient cannot be supervised 24 hours a day   Consultants:  Urology, Nesi  Cardiology CULTURES:  UA 6/13>>>pos nitrite, 0-2 wbc, many bacteria, >1000k glucose  BCx2 6/13>>> GNR in BOTH BOTTLES  UC 6/13>>> E COLI ANTIBIOTICS:  Zosyn 6/13  Ceftriaxone 6/15 To complete course of antibiotics completely of Keflex on 6/20   Discharge Exam: Filed Vitals:   07/08/12 2110 07/09/12 0500 07/09/12 2002 07/10/12 0431  BP: 155/52 122/58 141/60 160/73  Pulse: 65 72 73 70  Temp: 98.3 F (36.8 C) 97.6 F (36.4 C) 97.6 F (36.4 C) 98.6 F (37 C)  TempSrc: Oral  Oral Oral  Resp: 18 16 16 18   Height:      Weight:  54.3 kg (119 lb 11.4 oz)  55.7 kg (122 lb 12.7 oz)  SpO2: 96% 96% 96% 96%   Alert pleasant oriented, nad  General: HOH, no acute distress Cardiovascular:  s1 s2 no m/r/g chronic a fib Respiratory: clear  Discharge Instructions  Discharge Orders   Future  Appointments Provider Department Dept Phone   07/20/2012 11:00 AM Rosalio Macadamia, NP [facility] Main [facility] [place]) [phone]   08/06/2012 10:30 AM Hillis Range, MD [facility] Main [facility] [place]) [phone]   Future Orders Complete By Expires     Diet - low sodium heart healthy  As directed      Discharge instructions  As directed     Comments:      Continue your meds as per the changes on the sciprts given to you See cardiology in about a week and get some labs Finish the antibiotics Rx for you.  You might need a urologist to see you as an outpatient. We will order Physical therapy at home    Increase activity slowly  As directed         Medication List    TAKE these medications       apixaban 2.5 MG Tabs tablet  Commonly known as:  ELIQUIS  Take 1 tablet (2.5 mg total) by mouth 2 (two) times daily.     CALCIUM 600 PO  Take 600 mg by mouth daily.     carvedilol 6.25 MG tablet  Commonly known as:  COREG  Take 3.125 mg by mouth 2 (two) times daily with a meal.     cefUROXime 500 MG tablet  Commonly known as:  CEFTIN  Take 1 tablet (500 mg total) by mouth 2 (two) times daily.     diltiazem 180 MG 24 hr capsule  Commonly known as:  CARDIZEM CD  Take 1 capsule (180 mg total) by mouth daily.     docusate sodium 100 MG capsule  Commonly known as:  COLACE  Take 100 mg by mouth as needed.     estradiol 0.1 MG/GM vaginal cream  Commonly known as:  ESTRACE  Place 2 g vaginally 3 (three) times a week. Monday, Wednesday, friday     flecainide 100 MG tablet  Commonly known as:  TAMBOCOR  Take 1 tablet (100 mg total) by mouth every 12 (twelve) hours.     furosemide 40 MG tablet  Commonly known as:  LASIX  Take 1 tablet (40 mg total) by mouth daily.     LORazepam 0.5 MG tablet  Commonly known as:  ATIVAN  Take 0.5 mg by mouth 2 (two) times daily as needed. For sleep     rosuvastatin 5 MG tablet  Commonly known as:  CRESTOR  Take 2.5 mg by mouth See admin instructions. Monday-Friday     sertraline 50 MG tablet  Commonly known as:  ZOLOFT  Take 50 mg by mouth every morning.     Vitamin D (Ergocalciferol) 50000 UNITS Caps  Commonly known as:  DRISDOL  Take 50,000 Units by mouth every Wednesday.       Allergies  Allergen Reactions  . Sulfonamide Derivatives  Rash    "it was all over my legs"  . Citalopram Hydrobromide     Pt does not remember reaction      The results of significant diagnostics from this hospitalization (including imaging, microbiology, ancillary and laboratory) are listed below for reference.    Significant Diagnostic Studies: Dg Chest 2 View  07/09/2012   *RADIOLOGY REPORT*  Clinical Data: Weakness, shortness of breath, pneumonia  CHEST - 2 VIEW  Comparison: 07/07/2012  Findings: Upper normal heart size. Left subclavian transvenous pacemaker leads project at right atrium and ventricle. Atherosclerotic calcification aorta. Newly prominent central pulmonary arteries unchanged. Underlying COPD and chronic bronchitic changes. Tiny bibasilar  effusions. No segmental infiltrate, pleural effusion or pneumothorax. Bones diffusely demineralized.  IMPRESSION: Post pacemaker. Upper normal heart size with stable slightly prominent central pulmonary arteries. Changes of COPD/chronic bronchitis.   Original Report Authenticated By: Ulyses Southward, M.D.   Ct Angio Chest Pe W/cm &/or Wo Cm  07/06/2012   *RADIOLOGY REPORT*  Clinical Data:  Shortness of breath, abdominal pain, nausea.  CT ANGIOGRAPHY CHEST CT ABDOMEN AND PELVIS WITH CONTRAST  Technique:  Multidetector CT imaging of the chest was performed using the standard protocol during bolus administration of intravenous contrast.  Multiplanar CT image reconstructions including MIPs were obtained to evaluate the vascular anatomy. Multidetector CT imaging of the abdomen and pelvis was performed using the standard protocol during bolus administration of intravenous contrast.  Contrast: OMNIPAQUE IOHEXOL 350 MG/ML SOLN  Comparison:  CT of the pelvis of 02/12/2007.  CTA CHEST  Findings:  Mediastinum: Study is slightly limited by respiratory motion.  With these limitations in mind, there is no evidence of clinically relevant central, lobar or segmental sized pulmonary embolism. Unfortunately, smaller  subsegmental sized pulmonary emboli cannot be accurately excluded on this examination. Heart size is borderline enlarged. There is no significant pericardial fluid, thickening or pericardial calcification.  Left-sided pacemaker device in place with lead tips terminating in the right atrial appendage and the right ventricular apex.  A small hiatal hernia. The No pathologically enlarged mediastinal or hilar lymph nodes. There is atherosclerosis of the thoracic aorta, the great vessels of the mediastinum and the coronary arteries, including calcified atherosclerotic plaque in the left anterior descending and right coronary arteries.  Lungs/Pleura: Trace right and small left pleural effusions are simple in appearance layering dependently.  A small area of consolidative air space disease in the posterior aspect of the right lower lobe.  Multifocal other areas of ground-glass attenuation and extensive interlobular septal thickening noted throughout the lungs bilaterally, as well as peribronchovascular interstitial thickening, favored to represent a manifestation of pulmonary edema.  Laxity of the trachea and mainstem bronchi bilaterally which appears to change with respiration, compatible with tracheobronchial malacia.  Musculoskeletal: There are no aggressive appearing lytic or blastic lesions noted in the visualized portions of the skeleton.   Review of the MIP images confirms the above findings.  IMPRESSION:  1.  Despite the limitations of this examination, there is no evidence to suggest clinically relevant central, lobar or segmental sized pulmonary embolism. 2.  The appearance of the chest is most compatible with mild congestive heart failure. 3.  More confluent consolidative airspace disease in the posterior aspect of the right lower lobe Bures reflects sequelae of mild aspiration or a developing site of infection. 4. Atherosclerosis, including two-vessel coronary artery disease. 5.  Tracheobronchial malacia. 6.   Additional incidental findings, as above.  CT ABDOMEN AND PELVIS  Findings:  Abdomen/Pelvis:  Image 58 of series 8 demonstrates a 7 mm calculus in the distal third of the left ureter shortly before the left ureterovesicular junction.  This is associated with mild to moderate left-sided hydroureteronephrosis extensive perinephric stranding, compatible with obstruction.  The extrarenal pelvis is noted on the right side (normal anatomical variant).  5.1 x 3.4 x 4.0 cm mass in the right adrenal gland has internal fatty attenuation, most compatible with a large adrenal myelolipoma. There is also a 3.2 x 2.5 x 2.6 cm left adrenal mass which is indeterminate.  This mass measures 94 HU on the portal venous phase imaging, and 58 HU on the 3-minute post contrast delayed imaging,  for a relative washout of approximately 39.4% (lesions with relative washout of less than 40% are considered indeterminate, however, that is based on a 15-minute delay).  A subcentimeter low attenuation lesion in segment three of the liver is too small to definitively characterize, but statistically likely a small cyst. Minimal intrahepatic biliary ductal dilatation.  Common bile duct is normal in caliber.  The appearance of the gallbladder and spleen is unremarkable.  A few pancreatic calcifications Ketter be related to prior episodes of pancreatitis.  Small volume of perihepatic ascites.  No pneumoperitoneum.  No pathologic distension of small bowel.  Numerous reactive sized retroperitoneal lymph nodes are noted, but no definite pathologically enlarged abdominal or pelvic lymph nodes are identified on today's examination.  Uterus and ovaries are atrophic, but otherwise unremarkable in appearance.  Musculoskeletal: Old healed fractures of the parasymphyseal aspect of the right pelvis involving both superior and inferior pubic rami are noted. There are no aggressive appearing lytic or blastic lesions noted in the visualized portions of the skeleton.   Review of the MIP images confirms the above findings.  IMPRESSION: 1.  7 mm partially obstructive calculus in the distal third of the left ureter with mild to moderate left-sided hydroureteronephrosis and extensive left-sided perinephric stranding.  2.  5.1 x 3.4 x 4.0 cm fatty attenuation mass in the right adrenal gland is compatible with a large myelolipoma. 3.  In addition, there is a 2.5 x 3.2 x 2.6 cm mass in the left adrenal gland which by washout characteristics almost certainly represents an adenoma. If for some clinical reason there is concern for metastatic lesion or primary adrenal malignancy, correlation with noncontrast CT of the abdomen could be obtained (the patient has a pacemaker device and is not a candidate for MRI examination). 4.  Atherosclerosis. 5.  Minimal intrahepatic biliary ductal dilatation without evidence of common bile duct or pancreatic duct dilatation.  This finding is of uncertain etiology and significance, but Betsch be chronic in this patient.  Correlation with liver function tests is suggested. 6.  Small volume of perihepatic ascites.   Original Report Authenticated By: Trudie Reed, M.D.   Ct Abdomen Pelvis W Contrast  07/06/2012   *RADIOLOGY REPORT*  Clinical Data:  Shortness of breath, abdominal pain, nausea.  CT ANGIOGRAPHY CHEST CT ABDOMEN AND PELVIS WITH CONTRAST  Technique:  Multidetector CT imaging of the chest was performed using the standard protocol during bolus administration of intravenous contrast.  Multiplanar CT image reconstructions including MIPs were obtained to evaluate the vascular anatomy. Multidetector CT imaging of the abdomen and pelvis was performed using the standard protocol during bolus administration of intravenous contrast.  Contrast: OMNIPAQUE IOHEXOL 350 MG/ML SOLN  Comparison:  CT of the pelvis of 02/12/2007.  CTA CHEST  Findings:  Mediastinum: Study is slightly limited by respiratory motion.  With these limitations in mind, there is no  evidence of clinically relevant central, lobar or segmental sized pulmonary embolism. Unfortunately, smaller subsegmental sized pulmonary emboli cannot be accurately excluded on this examination. Heart size is borderline enlarged. There is no significant pericardial fluid, thickening or pericardial calcification.  Left-sided pacemaker device in place with lead tips terminating in the right atrial appendage and the right ventricular apex.  A small hiatal hernia. The No pathologically enlarged mediastinal or hilar lymph nodes. There is atherosclerosis of the thoracic aorta, the great vessels of the mediastinum and the coronary arteries, including calcified atherosclerotic plaque in the left anterior descending and right coronary arteries.  Lungs/Pleura: Trace  right and small left pleural effusions are simple in appearance layering dependently.  A small area of consolidative air space disease in the posterior aspect of the right lower lobe.  Multifocal other areas of ground-glass attenuation and extensive interlobular septal thickening noted throughout the lungs bilaterally, as well as peribronchovascular interstitial thickening, favored to represent a manifestation of pulmonary edema.  Laxity of the trachea and mainstem bronchi bilaterally which appears to change with respiration, compatible with tracheobronchial malacia.  Musculoskeletal: There are no aggressive appearing lytic or blastic lesions noted in the visualized portions of the skeleton.   Review of the MIP images confirms the above findings.  IMPRESSION:  1.  Despite the limitations of this examination, there is no evidence to suggest clinically relevant central, lobar or segmental sized pulmonary embolism. 2.  The appearance of the chest is most compatible with mild congestive heart failure. 3.  More confluent consolidative airspace disease in the posterior aspect of the right lower lobe [place]osse reflects sequelae of mild aspiration or a developing site of  infection. 4. Atherosclerosis, including two-vessel coronary artery disease. 5.  Tracheobronchial malacia. 6.  Additional incidental findings, as above.  CT ABDOMEN AND PELVIS  Findings:  Abdomen/Pelvis:  Image 58 of series 8 demonstrates a 7 mm calculus in the distal third of the left ureter shortly before the left ureterovesicular junction.  This is associated with mild to moderate left-sided hydroureteronephrosis extensive perinephric stranding, compatible with obstruction.  The extrarenal pelvis is noted on the right side (normal anatomical variant).  5.1 x 3.4 x 4.0 cm mass in the right adrenal gland has internal fatty attenuation, most compatible with a large adrenal myelolipoma. There is also a 3.2 x 2.5 x 2.6 cm left adrenal mass which is indeterminate.  This mass measures 94 HU on the portal venous phase imaging, and 58 HU on the 3-minute post contrast delayed imaging, for a relative washout of approximately 39.4% (lesions with relative washout of less than 40% are considered indeterminate, however, that is based on a 15-minute delay).  A subcentimeter low attenuation lesion in segment three of the liver is too small to definitively characterize, but statistically likely a small cyst. Minimal intrahepatic biliary ductal dilatation.  Common bile duct is normal in caliber.  The appearance of the gallbladder and spleen is unremarkable.  A few pancreatic calcifications [place]lawitter be related to prior episodes of pancreatitis.  Small volume of perihepatic ascites.  No pneumoperitoneum.  No pathologic distension of small bowel.  Numerous reactive sized retroperitoneal lymph nodes are noted, but no definite pathologically enlarged abdominal or pelvic lymph nodes are identified on today's examination.  Uterus and ovaries are atrophic, but otherwise unremarkable in appearance.  Musculoskeletal: Old healed fractures of the parasymphyseal aspect of the right pelvis involving both superior and inferior pubic rami are noted.  There are no aggressive appearing lytic or blastic lesions noted in the visualized portions of the skeleton.  Review of the MIP images confirms the above findings.  IMPRESSION: 1.  7 mm partially obstructive calculus in the distal third of the left ureter with mild to moderate left-sided hydroureteronephrosis and extensive left-sided perinephric stranding.  2.  5.1 x 3.4 x 4.0 cm fatty attenuation mass in the right adrenal gland is compatible with a large myelolipoma. 3.  In addition, there is a 2.5 x 3.2 x 2.6 cm mass in the left adrenal gland which by washout characteristics almost certainly represents an adenoma. If for some clinical reason there is concern for metastatic lesion  or primary adrenal malignancy, correlation with noncontrast CT of the abdomen could be obtained (the patient has a pacemaker device and is not a candidate for MRI examination). 4.  Atherosclerosis. 5.  Minimal intrahepatic biliary ductal dilatation without evidence of common bile duct or pancreatic duct dilatation.  This finding is of uncertain etiology and significance, but Tobler be chronic in this patient.  Correlation with liver function tests is suggested. 6.  Small volume of perihepatic ascites.   Original Report Authenticated By: Trudie Reed, M.D.   US Renal  07/08/2012   *RADIOLOGY REPORT*  Clinical Data: Reassess hydronephrosis  RENAL/URINARY TRACT ULTRASOUND COMPLETE  Comparison:  07/07/2011 ultrasound and CT.  Findings:  Right Kidney:  11.0 cm. No hydronephrosis or renal mass.  Left Kidney:  11.6 cm. No hydronephrosis or renal mass.  Bladder:  Decompressed with Foley catheter placed.  Small left-sided pleural effusion.  Gallstones incompletely assessed on present exam.  Right adrenal mass measures up to 4.1 cm.  Please see recent CT report.  IMPRESSION: Resolution of previously noted hydronephrosis.  Please see above discussion.   Original Report Authenticated By: Lacy Duverney, M.D.   US Renal Port  07/06/2012    *RADIOLOGY REPORT*  Clinical Data: Left ureteral calculus causing mild to moderate left hydronephrosis on an abdomen and pelvis CT earlier today.  RENAL/URINARY TRACT ULTRASOUND COMPLETE  Comparison:  Abdomen pelvis CT obtained earlier today.  Findings:  Right Kidney:  Normal, measuring 10.9 cm in length.  Previously demonstrated prominent extrarenal pelvis.  Left Kidney:  Mild to moderate dilatation of the left renal collecting system with possible mild improvement.  Otherwise, normal with fetal lobulations, measuring 11.9 cm in length.  Bladder:  Nondistended with a Foley catheter in place.  Multiple gallstones are noted in the gallbladder measuring up to 1.8 cm in maximum diameter each.  Unable to determine if the patient was focally tender over the gallbladder due to medication. No gallbladder wall thickening or pericholecystic fluid.  Normal caliber common duct measuring 3.9 mm in diameter proximally.  Poorly defined rounded echogenic mass superior to the right kidney, corresponding to the large adrenal myelolipoma seen on the CT.  IMPRESSION:  1.  Mild to moderate left hydronephrosis with possible mild improvement (difficult to directly compare different modalities). 2.  Cholelithiasis without evidence of cholecystitis. 3.  Previously demonstrated large right adrenal myelolipoma.   Original Report Authenticated By: Beckie Salts, M.D.   Dg Chest Port 1 View  07/07/2012   *RADIOLOGY REPORT*  Clinical Data: Evaluate for pulmonary edema  PORTABLE CHEST - 1 VIEW  Comparison: Recent chest CT 07/06/2012; prior chest x-ray 06/04/2012  Findings: Stable position of left subclavian approach cardiac rhythm maintenance device with leads projecting over the right atrium and right ventricle.  Inspiratory volumes are slightly low and there is trace bibasilar subsegmental atelectasis.  Mild pulmonary vascular congestion without overt edema.  Stable cardiac and mediastinal contours with enlargement of the bilateral main  pulmonary arteries.  Atherosclerotic calcifications noted in the transverse aorta.  No acute osseous abnormality.  IMPRESSION:  1.  Decreased inspiratory volumes with mild bibasilar atelectasis. 2.  Mild pulmonary vascular congestion without overt edema   Original Report Authenticated By: Malachy Moan, M.D.   Dg Abd Acute W/chest  07/06/2012   *RADIOLOGY REPORT*  Clinical Data: Abdominal pain  ACUTE ABDOMEN SERIES (ABDOMEN 2 VIEW & CHEST 1 VIEW)  Comparison: Chest 06/04/2012  Findings: Increased lung markings diffusely and bilaterally compared to the prior study.  This Luff be  due to heart failure and vascular congestion.  Acute pulmonary infection diffusely is also possible.  Negative for lumbar infiltrate.  Pacemaker in good position.  No effusion.  Abdomen:  Nonobstructive bowel gas pattern.  No free air.  No dilated bowel loops.  No renal calculi.  IMPRESSION: Prominent lung markings diffusely and bilaterally suggestive of fluid overload however diffuse infection also possible  Nonobstructive bowel gas pattern.   Original Report Authenticated By: Janeece Riggers, M.D.    Microbiology: Recent Results (from the past 240 hour(s))  URINE CULTURE     Status: None   Collection Time    07/06/12 11:30 AM      Result Value Range Status   Specimen Description URINE, CATHETERIZED   Final   Special Requests Normal   Final   Culture  Setup Time 07/06/2012 19:19   Final   Colony Count >=100,000 COLONIES/ML   Final   Culture ESCHERICHIA COLI   Final   Report Status 07/09/2012 FINAL   Final   Organism ID, Bacteria ESCHERICHIA COLI   Final  CULTURE, BLOOD (ROUTINE X 2)     Status: None   Collection Time    07/06/12 11:55 AM      Result Value Range Status   Specimen Description BLOOD HAND RIGHT   Final   Special Requests BOTTLES DRAWN AEROBIC ONLY   Final   Culture  Setup Time 07/06/2012 16:06   Final   Culture     Final   Value: ESCHERICHIA COLI     Note: SUSCEPTIBILITIES PERFORMED ON PREVIOUS  CULTURE WITHIN THE LAST 5 DAYS.     [id number] Note: Gram Stain Report Called to,Read Back By and Verified With: CYRIL SORCHA [id number]   Report Status 07/09/2012 FINAL   Final  CULTURE, BLOOD (ROUTINE X 2)     Status: None   Collection Time    07/06/12 12:00 PM      Result Value Range Status   Specimen Description BLOOD ARM LEFT   Final   Special Requests BOTTLES DRAWN AEROBIC AND ANAEROBIC 10CC   Final   Culture  Setup Time 07/06/2012 16:06   Final   Culture     Final   Value: ESCHERICHIA COLI     [id number] Note: Gram Stain Report Called to,Read Back By and Verified With: Lowella Dell [address] [facility]   Report Status 07/09/2012 FINAL   Final   Organism ID, Bacteria ESCHERICHIA COLI   Final  MRSA PCR SCREENING     Status: None   Collection Time    07/06/12  8:06 PM      Result Value Range Status   MRSA by PCR NEGATIVE  NEGATIVE Final   Comment:            The GeneXpert MRSA Assay (FDA     approved for NASAL specimens     only), is one component of a     comprehensive MRSA colonization     surveillance program. It is not     intended to diagnose MRSA     infection nor to guide or     monitor treatment for     MRSA infections.     Labs: Basic Metabolic Panel:  Recent Labs Lab 07/07/12 0340 07/07/12 0810 07/07/12 1535 07/08/12 0418 07/09/12 0410 07/10/12 0530  NA 136  --  139 139 136 136  K 3.0*  --  3.8 3.4* 3.3* 3.7  CL 96  --  99 100 96 97  CO2 28  --  28 29 29 30   GLUCOSE 240*  --  302* 185* 170* 187*  BUN 17  --  21 24* 24* 24*  CREATININE 0.67  --  0.72 0.68 0.72 0.75  CALCIUM 8.8  --  9.3 10.0 9.6 9.5  MG  --  1.9  --  1.9 1.6  --   PHOS  --   --   --  2.2* 3.1  --    Liver Function Tests:  Recent Labs Lab 07/06/12 1028 07/10/12 0530  AST 28 8  ALT 52* 16  ALKPHOS 95 63  BILITOT 1.0 1.1  PROT 6.9 5.9*  ALBUMIN 3.5 2.8*    Recent Labs Lab 07/06/12 1028  LIPASE 31   No results found for this basename: AMMONIA,  in the last 168 hours CBC:  Recent  Labs Lab 07/06/12 1028 07/07/12 0340 07/08/12 0003 07/09/12 0410 07/10/12 0530  WBC 13.1* 19.2* 23.9* 16.6* 13.0*  NEUTROABS 12.1*  --   --   --   --   HGB 12.4 12.5 13.3 13.5 12.5  HCT 35.1* 33.8* 36.0 38.3 36.0  MCV 90.5 88.5 89.1 90.3 90.7  PLT 103* 115* 130* 163 187   Cardiac Enzymes:  Recent Labs Lab 07/06/12 1028 07/07/12 0810 07/07/12 1535 07/08/12 0003  TROPONINI <0.30 1.33* 1.45* 0.69*   BNP: BNP (last 3 results)  Recent Labs  07/06/12 1155  PROBNP 2584.0*   CBG:  Recent Labs Lab 07/09/12 1637 07/09/12 2001 07/10/12 0005 07/10/12 0435 07/10/12 0738  GLUCAP 196* 259* 122* 165* 164*       Signed:  Rhetta Mura  [facility] 07/10/2012, 8:54 AM

## 2012-07-10 NOTE — Evaluation (Signed)
Occupational Therapy Evaluation Patient Details Name: Jo [name] MRN: [id number] DOB: 06-28-1917 Today's Date: 07/10/2012 Time: 5621-3086 OT Time Calculation (min): 31 min  OT Assessment / Plan / Recommendation Clinical Impression  Pt is 79 y/o female admitted w/ heart failure, she has impaired ADL's and functional mobility transfers at this time. She was incontinent of bladder & unaware, and required Mod assist for hygeine after toileting/BM today. Rec. SNF rehab secondary to pt lives alone. Pt verbalized agreement with this    OT Assessment  Patient needs continued OT Services    Follow Up Recommendations  SNF    Barriers to Discharge Other (comment) (Lives alone, no 24hr assist per her report)    Equipment Recommendations  None recommended by OT    Recommendations for Other Services    Frequency  Min 2X/week    Precautions / Restrictions Precautions Precautions: Fall Restrictions Weight Bearing Restrictions: No   Pertinent Vitals/Pain No c/o except "I'm weak"    ADL  Eating/Feeding: Performed;Modified independent (Pt req increased time for eating) Where Assessed - Eating/Feeding: Chair;Bed level Grooming: Performed;Wash/dry hands;Set up Where Assessed - Grooming: Supported sitting Upper Body Bathing: Simulated;Set up;Min guard Where Assessed - Upper Body Bathing: Supported sitting Lower Body Bathing: Performed;Minimal assistance Where Assessed - Lower Body Bathing: Supported sit to stand;Supported sitting Upper Body Dressing: Performed;Minimal assistance Where Assessed - Upper Body Dressing: Supported sitting Lower Body Dressing: Simulated;Minimal assistance Where Assessed - Lower Body Dressing: Supported sit to stand;Supported sitting Toilet Transfer: Performed;Minimal Dentist Method: Sit to stand;Stand Wellsite geologist: Materials engineer and Hygiene: Performed;Moderate assistance Where Assessed -  Toileting Clothing Manipulation and Hygiene: Sit on 3-in-1 or toilet;Sit to stand from 3-in-1 or toilet Tub/Shower Transfer Method: Not assessed Equipment Used: Gait belt Transfers/Ambulation Related to ADLs: Pt is currently Min guard-Min assist for functional mobility and transfers related to ADL's, she reports increased weaknes and fatigues easily. ADL Comments: Pt is 79 y/o female admitted w/ heart failure, she has impaired ADL's and functional mobility transfers at this time. She was incontinent of bladder & unaware, and required Mod assist for hygeine after toileting/BM today. Rec. SNF rehab secondary to pt lives alone. Pt verbalized agreement with this.    OT Diagnosis: Generalized weakness  OT Problem List: Decreased strength;Decreased activity tolerance;Decreased knowledge of use of DME or AE;Cardiopulmonary status limiting activity OT Treatment Interventions: Self-care/ADL training;Therapeutic exercise;Energy conservation;DME and/or AE instruction;Therapeutic activities;Patient/family education   OT Goals Acute Rehab OT Goals OT Goal Formulation: With patient Time For Goal Achievement: 07/24/12 Potential to Achieve Goals: Good ADL Goals Pt Will Perform Grooming: with modified independence;Sitting, chair;Sitting, edge of bed;Sitting at sink ADL Goal: Grooming - Progress: Goal set today Pt Will Perform Lower Body Dressing: with modified independence;Sit to stand from chair;Sit to stand from bed;with adaptive equipment ADL Goal: Lower Body Dressing - Progress: Goal set today Pt Will Transfer to Toilet: with modified independence;with DME;Ambulation ADL Goal: Toilet Transfer - Progress: Goal set today Pt Will Perform Toileting - Clothing Manipulation: with modified independence;Standing ADL Goal: Toileting - Clothing Manipulation - Progress: Goal set today Pt Will Perform Toileting - Hygiene: with modified independence;Sitting on 3-in-1 or toilet ADL Goal: Toileting - Hygiene -  Progress: Goal set today  Visit Information  Last OT Received On: 07/10/12 Assistance Needed: +1 Reason Eval/Treat Not Completed: Fatigue/lethargy limiting ability to participate    Subjective Data  Subjective: "I'm so weak" Patient Stated Goal: Pt agreeable to SNF rehab prior to home  alone   Prior Functioning     Home Living Lives With: Alone Available Help at Discharge: Family;Available PRN/intermittently (Pt states dtr has had back surgery & agreeable to SNF rehab) Type of Home: House Home Access: Stairs to enter [employer] of Steps: 3 Entrance Stairs-Rails: None Home Layout: One level Bathroom Shower/Tub: Tub/shower unit Home Adaptive Equipment: Tub transfer bench;Straight cane Prior Function Level of Independence: Independent with assistive device(s) Able to Take Stairs?: Yes Driving: Yes Vocation: Retired Musician: HOH    Vision/Perception Vision - History Baseline Vision: Wears glasses all the time Patient Visual Report: No change from baseline   Cognition  Cognition Arousal/Alertness: Awake/alert Behavior During Therapy: WFL for tasks assessed/performed Overall Cognitive Status: Within Functional Limits for tasks assessed    Extremity/Trunk Assessment Right Upper Extremity Assessment RUE ROM/Strength/Tone: [facility] for tasks assessed (General weakness ) Left Upper Extremity Assessment LUE ROM/Strength/Tone: WFL for tasks assessed (General weakness) Right Lower Extremity Assessment RLE ROM/Strength/Tone: Deficits RLE ROM/Strength/Tone Deficits: generally weak Left Lower Extremity Assessment LLE ROM/Strength/Tone: Deficits LLE ROM/Strength/Tone Deficits: Generally weak Trunk Assessment Trunk Assessment: Normal     Mobility Bed Mobility Bed Mobility: Supine to Sit;Sitting - Scoot to Edge of Bed Supine to Sit: 4: Min guard;With rails;HOB elevated Sitting - Scoot to Edge of Bed: 4: Min guard;With rail Transfers Transfers: Sit  to Stand;Stand to Sit Sit to Stand: 4: Min assist;From bed;From chair/3-in-1;With upper extremity assist Stand to Sit: 4: Min guard;To chair/3-in-1;Without upper extremity assist Details for Transfer Assistance: Min guard for safety secondary to generalized weakness per pt reports.        Balance Balance Balance Assessed: Yes Static Sitting Balance Static Sitting - Balance Support: Feet supported;No upper extremity supported Static Standing Balance Static Standing - Balance Support: Bilateral upper extremity supported;During functional activity   End of Session OT - End of Session Equipment Utilized During Treatment: Gait belt;Other (comment) (3:1, 2 L O2) Patient left: in chair;with call bell/phone within reach Nurse Communication: Mobility status;Other (comment) (Rec SNF)  GO     Alm Bustard 07/10/2012, 9:35 AM

## 2012-07-10 NOTE — Progress Notes (Signed)
Rehab Admissions Coordinator Note:  Patient was screened by Brock Ra for appropriateness for an Inpatient Acute Rehab Consult.  Pt w/ decreased activity tolerance.  Txs recommend SNF & pt & her daughter are in agreement w/ SNF as d/c plan.   At this time, we are recommending Skilled Nursing Facility.   Brock Ra 07/10/2012, 9:52 AM  I can be reached at [phone].

## 2012-07-11 DIAGNOSIS — I509 Heart failure, unspecified: Secondary | ICD-10-CM | POA: Diagnosis not present

## 2012-07-11 DIAGNOSIS — R262 Difficulty in walking, not elsewhere classified: Secondary | ICD-10-CM | POA: Diagnosis not present

## 2012-07-11 DIAGNOSIS — M6281 Muscle weakness (generalized): Secondary | ICD-10-CM | POA: Diagnosis not present

## 2012-07-11 DIAGNOSIS — Z79899 Other long term (current) drug therapy: Secondary | ICD-10-CM | POA: Diagnosis not present

## 2012-07-11 DIAGNOSIS — F411 Generalized anxiety disorder: Secondary | ICD-10-CM

## 2012-07-11 DIAGNOSIS — I495 Sick sinus syndrome: Secondary | ICD-10-CM | POA: Diagnosis not present

## 2012-07-11 DIAGNOSIS — I10 Essential (primary) hypertension: Secondary | ICD-10-CM | POA: Diagnosis not present

## 2012-07-11 DIAGNOSIS — J690 Pneumonitis due to inhalation of food and vomit: Secondary | ICD-10-CM | POA: Diagnosis not present

## 2012-07-11 DIAGNOSIS — I5033 Acute on chronic diastolic (congestive) heart failure: Secondary | ICD-10-CM | POA: Diagnosis not present

## 2012-07-11 DIAGNOSIS — E119 Type 2 diabetes mellitus without complications: Secondary | ICD-10-CM | POA: Diagnosis not present

## 2012-07-11 DIAGNOSIS — J189 Pneumonia, unspecified organism: Secondary | ICD-10-CM | POA: Diagnosis not present

## 2012-07-11 DIAGNOSIS — R279 Unspecified lack of coordination: Secondary | ICD-10-CM | POA: Diagnosis not present

## 2012-07-11 DIAGNOSIS — E785 Hyperlipidemia, unspecified: Secondary | ICD-10-CM | POA: Diagnosis not present

## 2012-07-11 DIAGNOSIS — N12 Tubulo-interstitial nephritis, not specified as acute or chronic: Secondary | ICD-10-CM | POA: Diagnosis not present

## 2012-07-11 DIAGNOSIS — I4891 Unspecified atrial fibrillation: Secondary | ICD-10-CM | POA: Diagnosis not present

## 2012-07-11 DIAGNOSIS — R5383 Other fatigue: Secondary | ICD-10-CM | POA: Diagnosis not present

## 2012-07-11 LAB — GLUCOSE, CAPILLARY
Glucose-Capillary: 121 mg/dL — ABNORMAL HIGH (ref 70–99)
Glucose-Capillary: 143 mg/dL — ABNORMAL HIGH (ref 70–99)

## 2012-07-11 LAB — BASIC METABOLIC PANEL
CO2: 29 mEq/L (ref 19–32)
Chloride: 98 mEq/L (ref 96–112)
Glucose, Bld: 138 mg/dL — ABNORMAL HIGH (ref 70–99)
Potassium: 3.4 mEq/L — ABNORMAL LOW (ref 3.5–5.1)
Sodium: 135 mEq/L (ref 135–145)

## 2012-07-11 MED ORDER — LORAZEPAM 0.5 MG PO TABS: 0.5000 mg | ORAL_TABLET | Freq: Two times a day (BID) | ORAL | Status: DC | PRN

## 2012-07-11 MED ORDER — DILTIAZEM HCL ER COATED BEADS 240 MG PO CP24: 240.0000 mg | ORAL_CAPSULE | Freq: Every day | ORAL | Status: DC

## 2012-07-11 MED ORDER — CEPHALEXIN 500 MG PO CAPS: 500.0000 mg | ORAL_CAPSULE | Freq: Three times a day (TID) | ORAL | Status: DC

## 2012-07-11 MED ORDER — CEPHALEXIN 500 MG PO CAPS
500.0000 mg | ORAL_CAPSULE | Freq: Three times a day (TID) | ORAL | Status: DC
  Filled 2012-07-11 (×3): qty 1

## 2012-07-11 NOTE — Discharge Summary (Signed)
Physician Discharge Summary  Patient ID: Jo [name] MRN: [id number] DOB/AGE: 77/09/1917 77 y.o.  Admit date: 07/06/2012 Discharge date: 07/11/2012  Primary Care Physician:  Ezequiel Kayser, MD  Discharge Diagnoses:    . Acute on chronic diastolic HF (heart failure) Gram negative sepsis E coli UTI  Elevated troponin Atrial fibrillation  Hypotension RLL Consolidative Airspace Disease  Anxiety with depression Acute respiratory failure with hypoxia- resolved  Consults:  Critical care                     [facility] cardi[facility]                     Urology                      Inpatient rehabilitation     Recommendations for Outpatient Follow-up:  - please make an appointment with Dr. Brunilda Payor ([facility]) within 2 weeks  Allergies:   Allergies  Allergen Reactions  . Sulfonamide Derivatives Rash    "it was all over my legs"  . Citalopram Hydrobromide     Pt does not remember reaction     Discharge Medications:   Medication List    TAKE these medications       apixaban 2.5 MG Tabs tablet  Commonly known as:  ELIQUIS  Take 1 tablet (2.5 mg total) by mouth 2 (two) times daily.     CALCIUM 600 PO  Take 600 mg by mouth daily.     carvedilol 6.25 MG tablet  Commonly known as:  COREG  Take 3.125 mg by mouth 2 (two) times daily with a meal.     cephALEXin 500 MG capsule  Commonly known as:  KEFLEX  Take 1 capsule (500 mg total) by mouth every 8 (eight) hours. For 2 weeks, stop date 07/19/12     diltiazem 240 MG 24 hr capsule  Commonly known as:  CARDIZEM CD  Take 1 capsule (240 mg total) by mouth daily.     docusate sodium 100 MG capsule  Commonly known as:  COLACE  Take 100 mg by mouth as needed.     estradiol 0.1 MG/GM vaginal cream  Commonly known as:  ESTRACE  Place 2 g vaginally 3 (three) times a week. Monday, Wednesday, friday     flecainide 100 MG tablet  Commonly known as:  TAMBOCOR  Take 1 tablet (100 mg total) by mouth every 12 (twelve) hours.      furosemide 40 MG tablet  Commonly known as:  LASIX  Take 1 tablet (40 mg total) by mouth daily.     LORazepam 0.5 MG tablet  Commonly known as:  ATIVAN  Take 1 tablet (0.5 mg total) by mouth 2 (two) times daily as needed for anxiety. For sleep     rosuvastatin 5 MG tablet  Commonly known as:  CRESTOR  Take 2.5 mg by mouth See admin instructions. Monday-Friday     sertraline 50 MG tablet  Commonly known as:  ZOLOFT  Take 50 mg by mouth every morning.     Vitamin D (Ergocalciferol) 50000 UNITS Caps  Commonly known as:  DRISDOL  Take 50,000 Units by mouth every Wednesday.         Brief H and P: For complete details please refer to admission H and P, but in brief 79 y/o F with a PMH of HTN, PAF s/p pacemaker on Eliquis, DM (diet controlled) who presented to [facility] ER on  6/13 with four day history of abdominal pain & nausea. While in ER she was noted to have room air saturations in 80's and tachypnea. CTA Chest negative for PE, suggestive of mild CHF, and RLL consolidative disease concerning for infection. Abd CT demonstrated L renal obstructing calculi with hydronephrosis, extensive perinephric stranding. Urology Dr. Brunilda Payor was consulted due to appearanc eof [place] but this has since resolved. Lactic acid 1.6. Later in ER course, she had Afib with RVR and Rx'd with Cardizem / lasix, subsequent hypotension. UA positive for nitrites, many bacteria, 0-2 WBC, greater than 1000k glucose. PCCM consulted for SIRS / Hypotension. Transferred to [facility] service 6/16   Hospital Course:  1. Pyelonephritis, Ecoli in setting of nephrolithiasis-Urology thinks conservative management is enough-she had renal ultrasound performed on 6-16 which showed no hydronephrosis. Changed Zosyn to rocephin IV on 6/16 and then to PO Keflex 6/17 to complete a full course of antibiotics 6/26. She will need to followup with urology, Dr. Brunilda Payor in 10days-2 weeks. 2. Acute respiratory failure with hypoxia and right lung  pneumonia on the chest x-ray- currently O2 sats 98% on 2 L, continue antibiotics 3. Chronic afib, CHad2Vasc2 score=4. Patient was followed by cardiology inpatient. Per cardiology recommendation: Flecainide was increased to 100 q 12, Cardizem to 240 daily. Continie coreg 3.125mg  BID and Apixiban 2.5mg  bid 4. SSS-s/p pacemaker-stable currently 5. Hypotension-resolved now. Continue chronic meds 6. Diet controlled DM-continue SSI. Sugars 160-218. Likely no meds needed on d/c. 7. Depression-Sertraline 50 mg, ativan 0.5 mg bid 8. Aortic insufficiency-stable 9. Debility- Physical therapy recommended skilled nursing facility    Day of Discharge BP 107/61  Pulse 73  Temp(Src) 97.7 F (36.5 C) (Oral)  Resp 16  Ht 5\' 2"  (1.575 m)  Wt 55.3 kg (121 lb 14.6 oz)  BMI 22.29 kg/m2  SpO2 99%  Physical Exam: General: Alert and awake oriented x3 not in any acute distress. CVS: S1-S2 clear no murmur rubs or gallops Chest: clear to auscultation bilaterally, no wheezing rales or rhonchi Abdomen: soft nontender, nondistended, normal bowel sounds, no organomegaly Extremities: no cyanosis, clubbing or edema noted bilaterally Neuro: Cranial nerves II-XII intact, no focal neurological deficits   The results of significant diagnostics from this hospitalization (including imaging, microbiology, ancillary and laboratory) are listed below for reference.    LAB RESULTS: Basic Metabolic Panel:  Recent Labs Lab 07/09/12 0410 07/10/12 0530 07/11/12 0500  NA 136 136 135  K 3.3* 3.7 3.4*  CL 96 97 98  CO2 29 30 29   GLUCOSE 170* 187* 138*  BUN 24* 24* 26*  CREATININE 0.72 0.75 0.79  CALCIUM 9.6 9.5 9.9  MG 1.6  --   --   PHOS 3.1  --   --    Liver Function Tests:  Recent Labs Lab 07/06/12 1028 07/10/12 0530  AST 28 8  ALT 52* 16  ALKPHOS 95 63  BILITOT 1.0 1.1  PROT 6.9 5.9*  ALBUMIN 3.5 2.8*    Recent Labs Lab 07/06/12 1028  LIPASE 31   No results found for this basename: AMMONIA,  in  the last 168 hours CBC:  Recent Labs Lab 07/06/12 1028  07/09/12 0410 07/10/12 0530  WBC 13.1*  < > 16.6* 13.0*  NEUTROABS 12.1*  --   --   --   HGB 12.4  < > 13.5 12.5  HCT 35.1*  < > 38.3 36.0  MCV 90.5  < > 90.3 90.7  PLT 103*  < > 163 187  < > = values  in this interval not displayed. Cardiac Enzymes:  Recent Labs Lab 07/07/12 1535 07/08/12 0003  TROPONINI 1.45* 0.69*   BNP: No components found with this basename: POCBNP,  CBG:  Recent Labs Lab 07/11/12 0814 07/11/12 1136  GLUCAP 172* 247*    Significant Diagnostic Studies:  Ct Angio Chest Pe W/cm &/or Wo Cm  07/06/2012   *RADIOLOGY REPORT*  Clinical Data:  Shortness of breath, abdominal pain, nausea.  CT ANGIOGRAPHY CHEST CT ABDOMEN AND PELVIS WITH CONTRAST  Technique:  Multidetector CT imaging of the chest was performed using the standard protocol during bolus administration of intravenous contrast.  Multiplanar CT image reconstructions including MIPs were obtained to evaluate the vascular anatomy. Multidetector CT imaging of the abdomen and pelvis was performed using the standard protocol during bolus administration of intravenous contrast.  Contrast: OMNIPAQUE IOHEXOL 350 MG/ML SOLN  Comparison:  CT of the pelvis of 02/12/2007.  CTA CHEST  Findings:  Mediastinum: Study is slightly limited by respiratory motion.  With these limitations in mind, there is no evidence of clinically relevant central, lobar or segmental sized pulmonary embolism. Unfortunately, smaller subsegmental sized pulmonary emboli cannot be accurately excluded on this examination. Heart size is borderline enlarged. There is no significant pericardial fluid, thickening or pericardial calcification.  Left-sided pacemaker device in place with lead tips terminating in the right atrial appendage and the right ventricular apex.  A small hiatal hernia. The No pathologically enlarged mediastinal or hilar lymph nodes. There is atherosclerosis of the thoracic  aorta, the great vessels of the mediastinum and the coronary arteries, including calcified atherosclerotic plaque in the left anterior descending and right coronary arteries.  Lungs/Pleura: Trace right and small left pleural effusions are simple in appearance layering dependently.  A small area of consolidative air space disease in the posterior aspect of the right lower lobe.  Multifocal other areas of ground-glass attenuation and extensive interlobular septal thickening noted throughout the lungs bilaterally, as well as peribronchovascular interstitial thickening, favored to represent a manifestation of pulmonary edema.  Laxity of the trachea and mainstem bronchi bilaterally which appears to change with respiration, compatible with tracheobronchial malacia.  Musculoskeletal: There are no aggressive appearing lytic or blastic lesions noted in the visualized portions of the skeleton.   Review of the MIP images confirms the above findings.  IMPRESSION:  1.  Despite the limitations of this examination, there is no evidence to suggest clinically relevant central, lobar or segmental sized pulmonary embolism. 2.  The appearance of the chest is most compatible with mild congestive heart failure. 3.  More confluent consolidative airspace disease in the posterior aspect of the right lower lobe Julia reflects sequelae of mild aspiration or a developing site of infection. 4. Atherosclerosis, including two-vessel coronary artery disease. 5.  Tracheobronchial malacia. 6.  Additional incidental findings, as above.  CT ABDOMEN AND PELVIS  Findings:  Abdomen/Pelvis:  Image 58 of series 8 demonstrates a 7 mm calculus in the distal third of the left ureter shortly before the left ureterovesicular junction.  This is associated with mild to moderate left-sided hydroureteronephrosis extensive perinephric stranding, compatible with obstruction.  The extrarenal pelvis is noted on the right side (normal anatomical variant).  5.1 x 3.4 x 4.0  cm mass in the right adrenal gland has internal fatty attenuation, most compatible with a large adrenal myelolipoma. There is also a 3.2 x 2.5 x 2.6 cm left adrenal mass which is indeterminate.  This mass measures 94 HU on the portal venous phase imaging, and  58 HU on the 3-minute post contrast delayed imaging, for a relative washout of approximately 39.4% (lesions with relative washout of less than 40% are considered indeterminate, however, that is based on a 15-minute delay).  A subcentimeter low attenuation lesion in segment three of the liver is too small to definitively characterize, but statistically likely a small cyst. Minimal intrahepatic biliary ductal dilatation.  Common bile duct is normal in caliber.  The appearance of the gallbladder and spleen is unremarkable.  A few pancreatic calcifications Villarin be related to prior episodes of pancreatitis.  Small volume of perihepatic ascites.  No pneumoperitoneum.  No pathologic distension of small bowel.  Numerous reactive sized retroperitoneal lymph nodes are noted, but no definite pathologically enlarged abdominal or pelvic lymph nodes are identified on today's examination.  Uterus and ovaries are atrophic, but otherwise unremarkable in appearance.  Musculoskeletal: Old healed fractures of the parasymphyseal aspect of the right pelvis involving both superior and inferior pubic rami are noted. There are no aggressive appearing lytic or blastic lesions noted in the visualized portions of the skeleton.  Review of the MIP images confirms the above findings.  IMPRESSION: 1.  7 mm partially obstructive calculus in the distal third of the left ureter with mild to moderate left-sided hydroureteronephrosis and extensive left-sided perinephric stranding.  2.  5.1 x 3.4 x 4.0 cm fatty attenuation mass in the right adrenal gland is compatible with a large myelolipoma. 3.  In addition, there is a 2.5 x 3.2 x 2.6 cm mass in the left adrenal gland which by washout  characteristics almost certainly represents an adenoma. If for some clinical reason there is concern for metastatic lesion or primary adrenal malignancy, correlation with noncontrast CT of the abdomen could be obtained (the patient has a pacemaker device and is not a candidate for MRI examination). 4.  Atherosclerosis. 5.  Minimal intrahepatic biliary ductal dilatation without evidence of common bile duct or pancreatic duct dilatation.  This finding is of uncertain etiology and significance, but Reppucci be chronic in this patient.  Correlation with liver function tests is suggested. 6.  Small volume of perihepatic ascites.   Original Report Authenticated By: Trudie Reed, M.D.   Ct Abdomen Pelvis W Contrast  07/06/2012   *RADIOLOGY REPORT*  Clinical Data:  Shortness of breath, abdominal pain, nausea.  CT ANGIOGRAPHY CHEST CT ABDOMEN AND PELVIS WITH CONTRAST  Technique:  Multidetector CT imaging of the chest was performed using the standard protocol during bolus administration of intravenous contrast.  Multiplanar CT image reconstructions including MIPs were obtained to evaluate the vascular anatomy. Multidetector CT imaging of the abdomen and pelvis was performed using the standard protocol during bolus administration of intravenous contrast.  Contrast: OMNIPAQUE IOHEXOL 350 MG/ML SOLN  Comparison:  CT of the pelvis of 02/12/2007.  CTA CHEST  Findings:  Mediastinum: Study is slightly limited by respiratory motion.  With these limitations in mind, there is no evidence of clinically relevant central, lobar or segmental sized pulmonary embolism. Unfortunately, smaller subsegmental sized pulmonary emboli cannot be accurately excluded on this examination. Heart size is borderline enlarged. There is no significant pericardial fluid, thickening or pericardial calcification.  Left-sided pacemaker device in place with lead tips terminating in the right atrial appendage and the right ventricular apex.  A small hiatal  hernia. The No pathologically enlarged mediastinal or hilar lymph nodes. There is atherosclerosis of the thoracic aorta, the great vessels of the mediastinum and the coronary arteries, including calcified atherosclerotic plaque in the left  anterior descending and right coronary arteries.  Lungs/Pleura: Trace right and small left pleural effusions are simple in appearance layering dependently.  A small area of consolidative air space disease in the posterior aspect of the right lower lobe.  Multifocal other areas of ground-glass attenuation and extensive interlobular septal thickening noted throughout the lungs bilaterally, as well as peribronchovascular interstitial thickening, favored to represent a manifestation of pulmonary edema.  Laxity of the trachea and mainstem bronchi bilaterally which appears to change with respiration, compatible with tracheobronchial malacia.  Musculoskeletal: There are no aggressive appearing lytic or blastic lesions noted in the visualized portions of the skeleton.   Review of the MIP images confirms the above findings.  IMPRESSION:  1.  Despite the limitations of this examination, there is no evidence to suggest clinically relevant central, lobar or segmental sized pulmonary embolism. 2.  The appearance of the chest is most compatible with mild congestive heart failure. 3.  More confluent consolidative airspace disease in the posterior aspect of the right lower lobe Skilling reflects sequelae of mild aspiration or a developing site of infection. 4. Atherosclerosis, including two-vessel coronary artery disease. 5.  Tracheobronchial malacia. 6.  Additional incidental findings, as above.  CT ABDOMEN AND PELVIS  Findings:  Abdomen/Pelvis:  Image 58 of series 8 demonstrates a 7 mm calculus in the distal third of the left ureter shortly before the left ureterovesicular junction.  This is associated with mild to moderate left-sided hydroureteronephrosis extensive perinephric stranding, compatible  with obstruction.  The extrarenal pelvis is noted on the right side (normal anatomical variant).  5.1 x 3.4 x 4.0 cm mass in the right adrenal gland has internal fatty attenuation, most compatible with a large adrenal myelolipoma. There is also a 3.2 x 2.5 x 2.6 cm left adrenal mass which is indeterminate.  This mass measures 94 HU on the portal venous phase imaging, and 58 HU on the 3-minute post contrast delayed imaging, for a relative washout of approximately 39.4% (lesions with relative washout of less than 40% are considered indeterminate, however, that is based on a 15-minute delay).  A subcentimeter low attenuation lesion in segment three of the liver is too small to definitively characterize, but statistically likely a small cyst. Minimal intrahepatic biliary ductal dilatation.  Common bile duct is normal in caliber.  The appearance of the gallbladder and spleen is unremarkable.  A few pancreatic calcifications Lastinger be related to prior episodes of pancreatitis.  Small volume of perihepatic ascites.  No pneumoperitoneum.  No pathologic distension of small bowel.  Numerous reactive sized retroperitoneal lymph nodes are noted, but no definite pathologically enlarged abdominal or pelvic lymph nodes are identified on today's examination.  Uterus and ovaries are atrophic, but otherwise unremarkable in appearance.  Musculoskeletal: Old healed fractures of the parasymphyseal aspect of the right pelvis involving both superior and inferior pubic rami are noted. There are no aggressive appearing lytic or blastic lesions noted in the visualized portions of the skeleton.  Review of the MIP images confirms the above findings.  IMPRESSION: 1.  7 mm partially obstructive calculus in the distal third of the left ureter with mild to moderate left-sided hydroureteronephrosis and extensive left-sided perinephric stranding.  2.  5.1 x 3.4 x 4.0 cm fatty attenuation mass in the right adrenal gland is compatible with a large  myelolipoma. 3.  In addition, there is a 2.5 x 3.2 x 2.6 cm mass in the left adrenal gland which by washout characteristics almost certainly represents an adenoma. If for  some clinical reason there is concern for metastatic lesion or primary adrenal malignancy, correlation with noncontrast CT of the abdomen could be obtained (the patient has a pacemaker device and is not a candidate for MRI examination). 4.  Atherosclerosis. 5.  Minimal intrahepatic biliary ductal dilatation without evidence of common bile duct or pancreatic duct dilatation.  This finding is of uncertain etiology and significance, but Sans be chronic in this patient.  Correlation with liver function tests is suggested. 6.  Small volume of perihepatic ascites.   Original Report Authenticated By: Trudie Reed, M.D.   US Renal Port  07/06/2012   *RADIOLOGY REPORT*  Clinical Data: Left ureteral calculus causing mild to moderate left hydronephrosis on an abdomen and pelvis CT earlier today.  RENAL/URINARY TRACT ULTRASOUND COMPLETE  Comparison:  Abdomen pelvis CT obtained earlier today.  Findings:  Right Kidney:  Normal, measuring 10.9 cm in length.  Previously demonstrated prominent extrarenal pelvis.  Left Kidney:  Mild to moderate dilatation of the left renal collecting system with possible mild improvement.  Otherwise, normal with fetal lobulations, measuring 11.9 cm in length.  Bladder:  Nondistended with a Foley catheter in place.  Multiple gallstones are noted in the gallbladder measuring up to 1.8 cm in maximum diameter each.  Unable to determine if the patient was focally tender over the gallbladder due to medication. No gallbladder wall thickening or pericholecystic fluid.  Normal caliber common duct measuring 3.9 mm in diameter proximally.  Poorly defined rounded echogenic mass superior to the right kidney, corresponding to the large adrenal myelolipoma seen on the CT.  IMPRESSION:  1.  Mild to moderate left hydronephrosis with possible  mild improvement (difficult to directly compare different modalities). 2.  Cholelithiasis without evidence of cholecystitis. 3.  Previously demonstrated large right adrenal myelolipoma.   Original Report Authenticated By: Beckie Salts, M.D.   Dg Chest Port 1 View  07/07/2012   *RADIOLOGY REPORT*  Clinical Data: Evaluate for pulmonary edema  PORTABLE CHEST - 1 VIEW  Comparison: Recent chest CT 07/06/2012; prior chest x-ray 06/04/2012  Findings: Stable position of left subclavian approach cardiac rhythm maintenance device with leads projecting over the right atrium and right ventricle.  Inspiratory volumes are slightly low and there is trace bibasilar subsegmental atelectasis.  Mild pulmonary vascular congestion without overt edema.  Stable cardiac and mediastinal contours with enlargement of the bilateral main pulmonary arteries.  Atherosclerotic calcifications noted in the transverse aorta.  No acute osseous abnormality.  IMPRESSION:  1.  Decreased inspiratory volumes with mild bibasilar atelectasis. 2.  Mild pulmonary vascular congestion without overt edema   Original Report Authenticated By: Malachy Moan, M.D.   Dg Abd Acute W/chest  07/06/2012   *RADIOLOGY REPORT*  Clinical Data: Abdominal pain  ACUTE ABDOMEN SERIES (ABDOMEN 2 VIEW & CHEST 1 VIEW)  Comparison: Chest 06/04/2012  Findings: Increased lung markings diffusely and bilaterally compared to the prior study.  This Snader be due to heart failure and vascular congestion.  Acute pulmonary infection diffusely is also possible.  Negative for lumbar infiltrate.  Pacemaker in good position.  No effusion.  Abdomen:  Nonobstructive bowel gas pattern.  No free air.  No dilated bowel loops.  No renal calculi.  IMPRESSION: Prominent lung markings diffusely and bilaterally suggestive of fluid overload however diffuse infection also possible  Nonobstructive bowel gas pattern.   Original Report Authenticated By: Janeece Riggers, M.D.    Disposition and  Follow-up: Discharge Orders   Future Appointments Provider Department Dept Phone   07/20/2012  11:00 AM Rosalio Macadamia, NP [facility] Main Office [place]   08/06/2012 10:30 AM Hillis Range, MD [place] [facility] Main Office [place]) [phone]   Future Orders Complete By Expires     Diet - low sodium heart healthy  As directed     Increase activity slowly  As directed         DISPOSITION: Skilled nursing facility DIET: Heart healthy diet   ACTIVITY: As tolerated   DISCHARGE FOLLOW-UP Follow-up Information   Follow up with PERINI,MARK A, MD. Schedule an appointment as soon as possible for a visit in 10 days.   Contact information:   [address] [place] [address] [phone]       Follow up with Peter Swaziland, MD On 07/20/2012. (at 11: 00 am)    Contact information:   [address] [place] [address] [phone]       Follow up with NESI,MARC-HENRY, MD. Schedule an appointment as soon as possible for a visit in 2 weeks. (for follow-up)    Contact information:   [address] [place] [address] [phone]       Time spent on Discharge: 40 mins  Signed:   RAI,RIPUDEEP M.D. [facility] 07/11/2012, 1:33 PM Pager: [phone]

## 2012-07-11 NOTE — Progress Notes (Signed)
Clinical Child psychotherapist (CSW) informed pt ready for discharge to [facility]. CSW prepared and placed pt dc packet in shadow chart. Pt provided facility contact number for report and contacted [facility] for transport. Pt and daughter aware and agreeable. No additional needs, CSW signing off.  [name], MSW, [name] [phone]

## 2012-07-11 NOTE — Progress Notes (Signed)
I attempted to call [facility] nursing facility to give report to the nurse who would be receiving the patient but was sent to a voicemail. Will attempt to give report once again as well as make the Annabelle Harman, the oncoming nurse aware.

## 2012-07-11 NOTE — Progress Notes (Signed)
Clinical Social Worker (CSW) visited pt room and informed pt that [name] is able to offer placement. Pt remains agreeable to placement there and agreeable to have representative visit room today to complete paperwork. CSW notified pt daughter who is appreciative of update. CSW will facilitate with dc today.  [name], MSW, [name] [phone]

## 2012-07-16 ENCOUNTER — Non-Acute Institutional Stay (SKILLED_NURSING_FACILITY): Payer: Medicare Other | Admitting: Internal Medicine

## 2012-07-16 ENCOUNTER — Encounter: Payer: Self-pay | Admitting: Internal Medicine

## 2012-07-16 DIAGNOSIS — I4891 Unspecified atrial fibrillation: Secondary | ICD-10-CM | POA: Diagnosis not present

## 2012-07-16 DIAGNOSIS — R5381 Other malaise: Secondary | ICD-10-CM

## 2012-07-16 DIAGNOSIS — R531 Weakness: Secondary | ICD-10-CM

## 2012-07-16 DIAGNOSIS — J189 Pneumonia, unspecified organism: Secondary | ICD-10-CM | POA: Diagnosis not present

## 2012-07-16 DIAGNOSIS — R5383 Other fatigue: Secondary | ICD-10-CM | POA: Diagnosis not present

## 2012-07-16 DIAGNOSIS — F329 Major depressive disorder, single episode, unspecified: Secondary | ICD-10-CM

## 2012-07-16 DIAGNOSIS — N12 Tubulo-interstitial nephritis, not specified as acute or chronic: Secondary | ICD-10-CM

## 2012-07-16 NOTE — Progress Notes (Signed)
Patient ID: Jo [name], female   DOB: [birth year], 79 y.o.   MRN: [id number]  [facility]  PCP: Ezequiel Kayser, MD   Allergies  Allergen Reactions  . Sulfonamide Derivatives Rash    "it was all over my legs"  . Citalopram Hydrobromide     Pt does not remember reaction    Chief Complaint: new admit post hospitalization 07/06/12- 07/11/12  HPI:  79 y/o female patient with history of HTN, Afib s/p pacemaker and DM was admitted to hospital with abdominal pain and nausea. She was found to be hypoxic and tachypneic in ED and CTA chest with PE protocol was done which was negative for PE. Mild CHF and RLL consolidative disease concerning for infection were noted. She had afib with RVR in ED. Cardiology was consulted and her flecainide dose was adjusted and cardizem controlled her heart rate. CT abdomen showed left renal obstructing calculi with hydronephrosis, extensive perinephric stranding. Urology was consulted. She was treated for pyelonephritis. She was also treated for pneumonia. Once stable, she was transferred to medical floor. She has been sent to SNF for STR.  She was seen in her room today. She denies any complaints. She has been working with therpay team. Her strength is improving  Review of Systems  Constitutional: Negative for fever and chills.  Eyes: Negative for blurred vision.  Respiratory: Negative for cough, shortness of breath and wheezing.   Cardiovascular: Negative for chest pain, palpitations, orthopnea and leg swelling.  Gastrointestinal: Negative for heartburn, nausea, vomiting, abdominal pain and diarrhea.  Musculoskeletal: Negative for falls.  Skin: Negative for rash.  Neurological: Negative for dizziness, loss of consciousness and headaches.  Psychiatric/Behavioral: Negative for depression.     Past Medical History  Diagnosis Date  . History of aortic insufficiency   . Anxiety   . Hypertension   . Pacemaker   . PAF (paroxysmal atrial fibrillation)      a. s/p pacemaker. b. s/p [employer] gen change 07/2011.  . SSS (sick sinus syndrome)   . Type II diabetes mellitus     "controlled w/diet"  . Skin cancer ?1960's    "between breasts"  . Arthritis     "hands; between shoulders; back; feet"  . Panic attacks     "since losing husband 1994"  . Depression   . High risk medication use     on Flecainide  . Chronic anticoagulation   . Osteoporosis   . CHF (congestive heart failure)    Past Surgical History  Procedure Laterality Date  . Cesarean section  1955  . Cardiovascular stress test  12/09/2004  . Cataract extraction w/ intraocular lens  implant, bilateral  1990's  . Tonsillectomy      "school age"  . Appendectomy  1955  . Dilation and curettage of uterus  1953  . Breast cyst excision  ~ 1950    right  . Insert / replace / remove pacemaker  03/16/2006  . Skin cancer excision  ?1960's    "between my breasts"   Social History:   reports that she has never smoked. She has never used smokeless tobacco. She reports that she does not drink alcohol or use illicit drugs.  Family History  Problem Relation Age of Onset  . Emphysema Father     Medications: Patient's Medications  New Prescriptions   No medications on file  Previous Medications   APIXABAN (ELIQUIS) 2.5 MG TABS TABLET    Take 1 tablet (2.5 mg total) by mouth 2 (  two) times daily.   CALCIUM CARBONATE (CALCIUM 600 PO)    Take 600 mg by mouth daily.     CARVEDILOL (COREG) 6.25 MG TABLET    Take 3.125 mg by mouth 2 (two) times daily with a meal.   CEPHALEXIN (KEFLEX) 500 MG CAPSULE    Take 1 capsule (500 mg total) by mouth every 8 (eight) hours. For 2 weeks, stop date 07/19/12   DILTIAZEM (CARDIZEM CD) 240 MG 24 HR CAPSULE    Take 1 capsule (240 mg total) by mouth daily.   DOCUSATE SODIUM (COLACE) 100 MG CAPSULE    Take 100 mg by mouth as needed.   ESTRADIOL (ESTRACE) 0.1 MG/GM VAGINAL CREAM    Place 2 g vaginally 3 (three) times a week. Monday, Wednesday, friday    FLECAINIDE (TAMBOCOR) 100 MG TABLET    Take 1 tablet (100 mg total) by mouth every 12 (twelve) hours.   FUROSEMIDE (LASIX) 40 MG TABLET    Take 1 tablet (40 mg total) by mouth daily.   LORAZEPAM (ATIVAN) 0.5 MG TABLET    Take 1 tablet (0.5 mg total) by mouth 2 (two) times daily as needed for anxiety. For sleep   ROSUVASTATIN (CRESTOR) 5 MG TABLET    Take 2.5 mg by mouth See admin instructions. Monday-Friday   SERTRALINE (ZOLOFT) 50 MG TABLET    Take 50 mg by mouth every morning.    VITAMIN D, ERGOCALCIFEROL, (DRISDOL) 50000 UNITS CAPS    Take 50,000 Units by mouth every Wednesday.   Modified Medications   No medications on file  Discontinued Medications   No medications on file     Physical Exam: Filed Vitals:   07/16/12 1204  BP: 126/57  Pulse: 73  Temp: 97.2 F (36.2 C)  Resp: 16  SpO2: 98%   General: Alert and awake oriented x3 not in any acute distress. CVS: S1-S2 clear no murmur rubs or gallops Chest: clear to auscultation bilaterally, no wheezing rales or rhonchi Abdomen: soft nontender, nondistended, normal bowel sounds, no organomegaly Extremities: no cyanosis, clubbing or edema noted bilaterally Neuro: Cranial nerves II-XII intact, no focal neurological deficits  Labs reviewed: Basic Metabolic Panel:  Recent Labs  62/13/08 0810 07/07/12 1535 07/08/12 0418 07/09/12 0410 07/10/12 0530 07/11/12 0500  NA  --  139 139 136 136 135  K  --  3.8 3.4* 3.3* 3.7 3.4*  CL  --  99 100 96 97 98  CO2  --  28 29 29 30 29   GLUCOSE  --  302* 185* 170* 187* 138*  BUN  --  21 24* 24* 24* 26*  CREATININE  --  0.72 0.68 0.72 0.75 0.79  CALCIUM  --  9.3 10.0 9.6 9.5 9.9  MG 1.9  --  1.9 1.6  --   --   PHOS  --   --  2.2* 3.1  --   --    Liver Function Tests:  Recent Labs  06/04/12 1244 07/06/12 1028 07/10/12 0530  AST 11 28 8   ALT 10 52* 16  ALKPHOS 58 95 63  BILITOT 0.4 1.0 1.1  PROT 6.7 6.9 5.9*  ALBUMIN 3.7 3.5 2.8*    Recent Labs  07/06/12 1028  LIPASE 31    No results found for this basename: AMMONIA,  in the last 8760 hours CBC:  Recent Labs  07/29/11 1457 06/04/12 1244  07/06/12 1028  07/08/12 0003 07/09/12 0410 07/10/12 0530  WBC 8.8 8.6  < > 13.1*  < >  23.9* 16.6* 13.0*  NEUTROABS 5.9 5.6  --  12.1*  --   --   --   --   HGB 12.5 13.2  < > 12.4  < > 13.3 13.5 12.5  HCT 38.2 38.6  < > 35.1*  < > 36.0 38.3 36.0  MCV 94.5 92.1  < > 90.5  < > 89.1 90.3 90.7  PLT 219.0 192  < > 103*  < > 130* 163 187  < > = values in this interval not displayed. Cardiac Enzymes:  Recent Labs  07/07/12 0810 07/07/12 1535 07/08/12 0003  TROPONINI 1.33* 1.45* 0.69*   CBG:  Recent Labs  07/11/12 0450 07/11/12 0814 07/11/12 1136  GLUCAP 121* 172* 247*    Radiological Exams: Ct Angio Chest Pe W/cm &/or Wo Cm  07/06/2012   *RADIOLOGY REPORT*  Clinical Data:  Shortness of breath, abdominal pain, nausea.  CT ANGIOGRAPHY CHEST CT ABDOMEN AND PELVIS WITH CONTRAST  Technique:  Multidetector CT imaging of the chest was performed using the standard protocol during bolus administration of intravenous contrast.  Multiplanar CT image reconstructions including MIPs were obtained to evaluate the vascular anatomy. Multidetector CT imaging of the abdomen and pelvis was performed using the standard protocol during bolus administration of intravenous contrast.  Contrast: OMNIPAQUE IOHEXOL 350 MG/ML SOLN  Comparison:  CT of the pelvis of 02/12/2007.  CTA CHEST  Findings:  Mediastinum: Study is slightly limited by respiratory motion.  With these limitations in mind, there is no evidence of clinically relevant central, lobar or segmental sized pulmonary embolism. Unfortunately, smaller subsegmental sized pulmonary emboli cannot be accurately excluded on this examination. Heart size is borderline enlarged. There is no significant pericardial fluid, thickening or pericardial calcification.  Left-sided pacemaker device in place with lead tips terminating in the right  atrial appendage and the right ventricular apex.  A small hiatal hernia. The No pathologically enlarged mediastinal or hilar lymph nodes. There is atherosclerosis of the thoracic aorta, the great vessels of the mediastinum and the coronary arteries, including calcified atherosclerotic plaque in the left anterior descending and right coronary arteries. Lungs/Pleura: Trace right and small left pleural effusions are simple in appearance layering dependently.  A small area of consolidative air space disease in the posterior aspect of the right lower lobe.  Multifocal other areas of ground-glass attenuation and extensive interlobular septal thickening noted throughout the lungs bilaterally, as well as peribronchovascular interstitial thickening, favored to represent a manifestation of pulmonary edema.  Laxity of the trachea and mainstem bronchi bilaterally which appears to change with respiration, compatible with tracheobronchial malacia.  Musculoskeletal: There are no aggressive appearing lytic or blastic lesions noted in the visualized portions of the skeleton.   Review of the MIP images confirms the above findings.  IMPRESSION:  1.  Despite the limitations of this examination, there is no evidence to suggest clinically relevant central, lobar or segmental sized pulmonary embolism. 2.  The appearance of the chest is most compatible with mild congestive heart failure. 3.  More confluent consolidative airspace disease in the posterior aspect of the right lower lobe Mell reflects sequelae of mild aspiration or a developing site of infection. 4. Atherosclerosis, including two-vessel coronary artery disease. 5.  Tracheobronchial malacia. 6.  Additional incidental findings, as above.  CT ABDOMEN AND PELVIS  Findings:  Abdomen/Pelvis:  Image 58 of series 8 demonstrates a 7 mm calculus in the distal third of the left ureter shortly before the left ureterovesicular junction.  This is associated with  mild to moderate left-sided  hydroureteronephrosis extensive perinephric stranding, compatible with obstruction.  The extrarenal pelvis is noted on the right side (normal anatomical variant).  5.1 x 3.4 x 4.0 cm mass in the right adrenal gland has internal fatty attenuation, most compatible with a large adrenal myelolipoma. There is also a 3.2 x 2.5 x 2.6 cm left adrenal mass which is indeterminate.  This mass measures 94 HU on the portal venous phase imaging, and 58 HU on the 3-minute post contrast delayed imaging, for a relative washout of approximately 39.4% (lesions with relative washout of less than 40% are considered indeterminate, however, that is based on a 15-minute delay).  A subcentimeter low attenuation lesion in segment three of the liver is too small to definitively characterize, but statistically likely a small cyst. Minimal intrahepatic biliary ductal dilatation.  Common bile duct is normal in caliber.  The appearance of the gallbladder and spleen is unremarkable.  A few pancreatic calcifications Letizia be related to prior episodes of pancreatitis.  Small volume of perihepatic ascites.  No pneumoperitoneum.  No pathologic distension of small bowel.  Numerous reactive sized retroperitoneal lymph nodes are noted, but no definite pathologically enlarged abdominal or pelvic lymph nodes are identified on today's examination.  Uterus and ovaries are atrophic, but otherwise unremarkable in appearance.  Musculoskeletal: Old healed fractures of the parasymphyseal aspect of the right pelvis involving both superior and inferior pubic rami are noted. There are no aggressive appearing lytic or blastic lesions noted in the visualized portions of the skeleton.  Review of the MIP images confirms the above findings.  IMPRESSION: 1.  7 mm partially obstructive calculus in the distal third of the left ureter with mild to moderate left-sided hydroureteronephrosis and extensive left-sided perinephric stranding.  2.  5.1 x 3.4 x 4.0 cm fatty attenuation  mass in the right adrenal gland is compatible with a large myelolipoma. 3.  In addition, there is a 2.5 x 3.2 x 2.6 cm mass in the left adrenal gland which by washout characteristics almost certainly represents an adenoma. If for some clinical reason there is concern for metastatic lesion or primary adrenal malignancy, correlation with noncontrast CT of the abdomen could be obtained (the patient has a pacemaker device and is not a candidate for MRI examination). 4.  Atherosclerosis. 5.  Minimal intrahepatic biliary ductal dilatation without evidence of common bile duct or pancreatic duct dilatation.  This finding is of uncertain etiology and significance, but Searles be chronic in this patient.  Correlation with liver function tests is suggested. 6.  Small volume of perihepatic ascites.   Original Report Authenticated By: Trudie Reed, M.D.   Ct Abdomen Pelvis W Contrast  07/06/2012   *RADIOLOGY REPORT*  Clinical Data:  Shortness of breath, abdominal pain, nausea.  CT ANGIOGRAPHY CHEST CT ABDOMEN AND PELVIS WITH CONTRAST  Technique:  Multidetector CT imaging of the chest was performed using the standard protocol during bolus administration of intravenous contrast.  Multiplanar CT image reconstructions including MIPs were obtained to evaluate the vascular anatomy. Multidetector CT imaging of the abdomen and pelvis was performed using the standard protocol during bolus administration of intravenous contrast.  Contrast: OMNIPAQUE IOHEXOL 350 MG/ML SOLN  Comparison:  CT of the pelvis of 02/12/2007.  CTA CHEST  Findings:  Mediastinum: Study is slightly limited by respiratory motion.  With these limitations in mind, there is no evidence of clinically relevant central, lobar or segmental sized pulmonary embolism. Unfortunately, smaller subsegmental sized pulmonary emboli cannot be accurately  excluded on this examination. Heart size is borderline enlarged. There is no significant pericardial fluid, thickening or  pericardial calcification.  Left-sided pacemaker device in place with lead tips terminating in the right atrial appendage and the right ventricular apex.  A small hiatal hernia. The No pathologically enlarged mediastinal or hilar lymph nodes. There is atherosclerosis of the thoracic aorta, the great vessels of the mediastinum and the coronary arteries, including calcified atherosclerotic plaque in the left anterior descending and right coronary arteries. Lungs/Pleura: Trace right and small left pleural effusions are simple in appearance layering dependently.  A small area of consolidative air space disease in the posterior aspect of the right lower lobe.  Multifocal other areas of ground-glass attenuation and extensive interlobular septal thickening noted throughout the lungs bilaterally, as well as peribronchovascular interstitial thickening, favored to represent a manifestation of pulmonary edema.  Laxity of the trachea and mainstem bronchi bilaterally which appears to change with respiration, compatible with tracheobronchial malacia.  Musculoskeletal: There are no aggressive appearing lytic or blastic lesions noted in the visualized portions of the skeleton.   Review of the MIP images confirms the above findings.  IMPRESSION:  1.  Despite the limitations of this examination, there is no evidence to suggest clinically relevant central, lobar or segmental sized pulmonary embolism. 2.  The appearance of the chest is most compatible with mild congestive heart failure. 3.  More confluent consolidative airspace disease in the posterior aspect of the right lower lobe Voyles reflects sequelae of mild aspiration or a developing site of infection. 4. Atherosclerosis, including two-vessel coronary artery disease. 5.  Tracheobronchial malacia. 6.  Additional incidental findings, as above.  CT ABDOMEN AND PELVIS  Findings:  Abdomen/Pelvis:  Image 58 of series 8 demonstrates a 7 mm calculus in the distal third of the left ureter  shortly before the left ureterovesicular junction.  This is associated with mild to moderate left-sided hydroureteronephrosis extensive perinephric stranding, compatible with obstruction.  The extrarenal pelvis is noted on the right side (normal anatomical variant).  5.1 x 3.4 x 4.0 cm mass in the right adrenal gland has internal fatty attenuation, most compatible with a large adrenal myelolipoma. There is also a 3.2 x 2.5 x 2.6 cm left adrenal mass which is indeterminate.  This mass measures 94 HU on the portal venous phase imaging, and 58 HU on the 3-minute post contrast delayed imaging, for a relative washout of approximately 39.4% (lesions with relative washout of less than 40% are considered indeterminate, however, that is based on a 15-minute delay).  A subcentimeter low attenuation lesion in segment three of the liver is too small to definitively characterize, but statistically likely a small cyst. Minimal intrahepatic biliary ductal dilatation.  Common bile duct is normal in caliber.  The appearance of the gallbladder and spleen is unremarkable.  A few pancreatic calcifications Gagen be related to prior episodes of pancreatitis.  Small volume of perihepatic ascites.  No pneumoperitoneum.  No pathologic distension of small bowel.  Numerous reactive sized retroperitoneal lymph nodes are noted, but no definite pathologically enlarged abdominal or pelvic lymph nodes are identified on today's examination.  Uterus and ovaries are atrophic, but otherwise unremarkable in appearance.  Musculoskeletal: Old healed fractures of the parasymphyseal aspect of the right pelvis involving both superior and inferior pubic rami are noted. There are no aggressive appearing lytic or blastic lesions noted in the visualized portions of the skeleton.  Review of the MIP images confirms the above findings.  IMPRESSION: 1.  7  mm partially obstructive calculus in the distal third of the left ureter with mild to moderate left-sided  hydroureteronephrosis and extensive left-sided perinephric stranding.  2.  5.1 x 3.4 x 4.0 cm fatty attenuation mass in the right adrenal gland is compatible with a large myelolipoma. 3.  In addition, there is a 2.5 x 3.2 x 2.6 cm mass in the left adrenal gland which by washout characteristics almost certainly represents an adenoma. If for some clinical reason there is concern for metastatic lesion or primary adrenal malignancy, correlation with noncontrast CT of the abdomen could be obtained (the patient has a pacemaker device and is not a candidate for MRI examination). 4.  Atherosclerosis. 5.  Minimal intrahepatic biliary ductal dilatation without evidence of common bile duct or pancreatic duct dilatation.  This finding is of uncertain etiology and significance, but Ferryman be chronic in this patient.  Correlation with liver function tests is suggested. 6.  Small volume of perihepatic ascites.   Original Report Authenticated By: Trudie Reed, M.D.   US Renal Port  07/06/2012   *RADIOLOGY REPORT*  Clinical Data: Left ureteral calculus causing mild to moderate left hydronephrosis on an abdomen and pelvis CT earlier today.  RENAL/URINARY TRACT ULTRASOUND COMPLETE  Comparison:  Abdomen pelvis CT obtained earlier today.  Findings:  Right Kidney:  Normal, measuring 10.9 cm in length.  Previously demonstrated prominent extrarenal pelvis.  Left Kidney:  Mild to moderate dilatation of the left renal collecting system with possible mild improvement.  Otherwise, normal with fetal lobulations, measuring 11.9 cm in length.  Bladder:  Nondistended with a Foley catheter in place.  Multiple gallstones are noted in the gallbladder measuring up to 1.8 cm in maximum diameter each.  Unable to determine if the patient was focally tender over the gallbladder due to medication. No gallbladder wall thickening or pericholecystic fluid.  Normal caliber common duct measuring 3.9 mm in diameter proximally.  Poorly defined rounded  echogenic mass superior to the right kidney, corresponding to the large adrenal myelolipoma seen on the CT.  IMPRESSION:  1.  Mild to moderate left hydronephrosis with possible mild improvement (difficult to directly compare different modalities). 2.  Cholelithiasis without evidence of cholecystitis. 3.  Previously demonstrated large right adrenal myelolipoma.   Original Report Authenticated By: Beckie Salts, M.D.   Dg Chest Port 1 View  07/07/2012   *RADIOLOGY REPORT*  Clinical Data: Evaluate for pulmonary edema  PORTABLE CHEST - 1 VIEW  Comparison: Recent chest CT 07/06/2012; prior chest x-ray 06/04/2012  Findings: Stable position of left subclavian approach cardiac rhythm maintenance device with leads projecting over the right atrium and right ventricle.  Inspiratory volumes are slightly low and there is trace bibasilar subsegmental atelectasis.  Mild pulmonary vascular congestion without overt edema.  Stable cardiac and mediastinal contours with enlargement of the bilateral main pulmonary arteries.  Atherosclerotic calcifications noted in the transverse aorta.  No acute osseous abnormality.  IMPRESSION:  1.  Decreased inspiratory volumes with mild bibasilar atelectasis. 2.  Mild pulmonary vascular congestion without overt edema   Original Report Authenticated By: Malachy Moan, M.D.   Dg Abd Acute W/chest  07/06/2012   *RADIOLOGY REPORT*  Clinical Data: Abdominal pain  ACUTE ABDOMEN SERIES (ABDOMEN 2 VIEW & CHEST 1 VIEW)  Comparison: Chest 06/04/2012  Findings: Increased lung markings diffusely and bilaterally compared to the prior study.  This Fiorella be due to heart failure and vascular congestion.  Acute pulmonary infection diffusely is also possible.  Negative for lumbar infiltrate.  Pacemaker  in good position.  No effusion.  Abdomen:  Nonobstructive bowel gas pattern.  No free air.  No dilated bowel loops.  No renal calculi.  IMPRESSION: Prominent lung markings diffusely and bilaterally suggestive of  fluid overload however diffuse infection also possible  Nonobstructive bowel gas pattern.   Original Report Authenticated By: Janeece Riggers, M.D.    Assessment/Plan  Generalized weakness- from debility in setting of recent sepsis. To complete her antibiotic course. Continue rate controlling agent for heart rate. Fall precautions. Encourage po intake and hydration. To work with therapy team- PT/OT/ST with gait training, muscle group strengthening.  Pyelonephritis- to complete course of antibiotics, encouraged hydration. Remains afebrile.  afib- rate currently under control. Is s/p pacemaker. Continue cardizem and carvedilol for rate control. Continue eliquis for stroke prophylaxis. Also continue recently adjusted dose of flecainide and statin  Pneumonia- to complete her course of antibiotics. Breathing stable at present. Monitor temp and wbc curve  Depression- mood remains stable. Continue zoloft and ativan  Family/ staff Communication: reviewed care plan with patient and nursing supervisor   Labs/tests ordered- cbc, cmp

## 2012-07-18 ENCOUNTER — Other Ambulatory Visit: Payer: Self-pay | Admitting: *Deleted

## 2012-07-18 MED ORDER — LORAZEPAM 0.5 MG PO TABS: ORAL_TABLET | ORAL | Status: DC

## 2012-07-19 ENCOUNTER — Encounter: Payer: Self-pay | Admitting: Internal Medicine

## 2012-07-20 ENCOUNTER — Encounter: Payer: Self-pay | Admitting: Nurse Practitioner

## 2012-07-20 ENCOUNTER — Ambulatory Visit (INDEPENDENT_AMBULATORY_CARE_PROVIDER_SITE_OTHER): Payer: Medicare Other | Admitting: Nurse Practitioner

## 2012-07-20 VITALS — BP 100/56 | HR 68 | Ht 64.0 in | Wt 117.0 lb

## 2012-07-20 DIAGNOSIS — Z79899 Other long term (current) drug therapy: Secondary | ICD-10-CM | POA: Diagnosis not present

## 2012-07-20 DIAGNOSIS — I4891 Unspecified atrial fibrillation: Secondary | ICD-10-CM

## 2012-07-20 LAB — CBC WITH DIFFERENTIAL/PLATELET
Basophils Absolute: 0 10*3/uL (ref 0.0–0.1)
Basophils Relative: 0.3 % (ref 0.0–3.0)
Eosinophils Absolute: 0.1 10*3/uL (ref 0.0–0.7)
Eosinophils Relative: 1 % (ref 0.0–5.0)
HCT: 35 % — ABNORMAL LOW (ref 36.0–46.0)
Hemoglobin: 11.9 g/dL — ABNORMAL LOW (ref 12.0–15.0)
Lymphocytes Relative: 16.5 % (ref 12.0–46.0)
Lymphs Abs: 2.3 10*3/uL (ref 0.7–4.0)
MCHC: 34.1 g/dL (ref 30.0–36.0)
MCV: 95.6 fl (ref 78.0–100.0)
Monocytes Absolute: 1 10*3/uL (ref 0.1–1.0)
Monocytes Relative: 7.5 % (ref 3.0–12.0)
Neutro Abs: 10.5 10*3/uL — ABNORMAL HIGH (ref 1.4–7.7)
Neutrophils Relative %: 74.7 % (ref 43.0–77.0)
Platelets: 313 10*3/uL (ref 150.0–400.0)
RBC: 3.66 Mil/uL — ABNORMAL LOW (ref 3.87–5.11)
RDW: 13.4 % (ref 11.5–14.6)
WBC: 14 10*3/uL — ABNORMAL HIGH (ref 4.5–10.5)

## 2012-07-20 LAB — BASIC METABOLIC PANEL
BUN: 17 mg/dL (ref 6–23)
CO2: 35 mEq/L — ABNORMAL HIGH (ref 19–32)
Calcium: 10.8 mg/dL — ABNORMAL HIGH (ref 8.4–10.5)
Chloride: 95 mEq/L — ABNORMAL LOW (ref 96–112)
Creatinine, Ser: 0.8 mg/dL (ref 0.4–1.2)
GFR: 69.13 mL/min (ref >60.00)
Glucose, Bld: 141 mg/dL — ABNORMAL HIGH (ref 70–99)
Potassium: 3.3 mEq/L — ABNORMAL LOW (ref 3.5–5.1)
Sodium: 133 mEq/L — ABNORMAL LOW (ref 135–145)

## 2012-07-20 MED ORDER — POLYETHYLENE GLYCOL 3350 17 G PO PACK: 17.0000 g | PACK | Freq: Every day | ORAL | Status: DC

## 2012-07-20 NOTE — Progress Notes (Signed)
Cabella Kimm Bohnsack Date of Birth: 12-21-1917 Medical Record #[id number]  History of Present Illness: Ms. Seats is seen back today for her 6 month check. Seen for Dr. Swaziland. She is now 79 years of age. She has AI, depression, diet controlled DM, HTN and PAF with a pacemaker in place. Remains on Flecainide and Eliquis. Last generator change was in 2013.   Last seen in December and was doing ok.   She has most recently been in the hospital with urosepsis/pyelonephritis that was associated with respiratory failure, diastolic HF, AF with RVR  - she was to complete her antibiotics as of yesterday. She was quite deconditioned and was sent to SNF. Seeing Dr. Brunilda Payor for GU with conservative management recommended. Her flecainide was increased along with her Diltiazem and Coreg was continued.   Comes in today. She is here with her son. She is in a wheelchair. She is at [facility] for rehab. She did not like it initially but seems to be ok there. Now very constipated. No BM in several days. She was nauseated yesterday. No therapy yesterday. Says her heart is ok. No skips. Not dizzy. BP is low. Has lost over 9 pounds. Has been back to GU - her stone (>61mm) now gone. She is not short of breath. No chest pain.    Current Outpatient Prescriptions  Medication Sig Dispense Refill  . apixaban (ELIQUIS) 2.5 MG TABS tablet Take 1 tablet (2.5 mg total) by mouth 2 (two) times daily.  60 tablet  4  . Calcium Carbonate (CALCIUM 600 PO) Take 600 mg by mouth daily.        . carvedilol (COREG) 6.25 MG tablet Take 3.125 mg by mouth 2 (two) times daily with a meal.      . diltiazem (CARDIZEM CD) 240 MG 24 hr capsule Take 1 capsule (240 mg total) by mouth daily.      Marland Kitchen docusate sodium (COLACE) 100 MG capsule Take 100 mg by mouth as needed.      Marland Kitchen estradiol (ESTRACE) 0.1 MG/GM vaginal cream Place 2 g vaginally 3 (three) times a week. Monday, Wednesday, friday      . flecainide (TAMBOCOR) 100 MG tablet Take 1 tablet (100 mg total)  by mouth every 12 (twelve) hours.  60 tablet  0  . furosemide (LASIX) 40 MG tablet Take 1 tablet (40 mg total) by mouth daily.  30 tablet  0  . LORazepam (ATIVAN) 0.5 MG tablet Take one tablet by mouth at bedtime; Take 1/2 tablet in the morning for anxiety; Take 1/2 tablet by mouth evening for anxiety  60 tablet  5  . rosuvastatin (CRESTOR) 5 MG tablet Take 2.5 mg by mouth See admin instructions. Monday-Friday      . sertraline (ZOLOFT) 50 MG tablet Take 50 mg by mouth every morning.       . Vitamin D, Ergocalciferol, (DRISDOL) 50000 UNITS CAPS Take 50,000 Units by mouth every Wednesday.        No current facility-administered medications for this visit.    Allergies  Allergen Reactions  . Sulfonamide Derivatives Rash    "it was all over my legs"  . Citalopram Hydrobromide     Pt does not remember reaction    Past Medical History  Diagnosis Date  . History of aortic insufficiency   . Anxiety   . Hypertension   . Pacemaker   . PAF (paroxysmal atrial fibrillation)     a. s/p pacemaker. b. s/p Medtronic gen change 07/2011.  Marland Kitchen  SSS (sick sinus syndrome)   . Type II diabetes mellitus     "controlled w/diet"  . Skin cancer ?1960's    "between breasts"  . Arthritis     "hands; between shoulders; back; feet"  . Panic attacks     "since losing husband 1994"  . Depression   . High risk medication use     on Flecainide  . Chronic anticoagulation   . Osteoporosis   . CHF (congestive heart failure)     Past Surgical History  Procedure Laterality Date  . Cesarean section  1955  . Cardiovascular stress test  12/09/2004  . Cataract extraction w/ intraocular lens  implant, bilateral  1990's  . Tonsillectomy      "school age"  . Appendectomy  1955  . Dilation and curettage of uterus  1953  . Breast cyst excision  ~ 1950    right  . Insert / replace / remove pacemaker  03/16/2006  . Skin cancer excision  ?1960's    "between my breasts"    History  Smoking status  . Never Smoker    Smokeless tobacco  . Never Used    History  Alcohol Use No    Family History  Problem Relation Age of Onset  . Emphysema Father     Review of Systems: The review of systems is per the HPI.  All other systems were reviewed and are negative.  Physical Exam: BP 100/56  Pulse 68  Ht 5\' 4"  (1.626 m)  Wt 117 lb (53.071 kg)  BMI 20.07 kg/m2 Patient is very pleasant and in no acute distress. She is in a wheelchair - looks weak. Color is quite pale. Skin is warm and dry.  HEENT is unremarkable. Normocephalic/atraumatic. PERRL. Sclera are nonicteric. Neck is supple. No masses. No JVD. Lungs are clear. Cardiac exam shows a regular rate and rhythm. Abdomen is protruberant - a little firm.  Extremities are without any edema. Gait is not tested. No gross neurologic deficits noted.  LABORATORY DATA:  BMET and CBC pending  Lab Results  Component Value Date   WBC 13.0* 07/10/2012   HGB 12.5 07/10/2012   HCT 36.0 07/10/2012   PLT 187 07/10/2012   GLUCOSE 138* 07/11/2012   ALT 16 07/10/2012   AST 8 07/10/2012   NA 135 07/11/2012   K 3.4* 07/11/2012   CL 98 07/11/2012   CREATININE 0.79 07/11/2012   BUN 26* 07/11/2012   CO2 29 07/11/2012   INR 1.41 07/06/2012     Assessment / Plan: 1. PAF - seems to be in sinus by exam. Rate is ok.   2. HTN - blood pressure is quite low. Weight is down. I am stopping her Lasix.   3. Advanced age - still deconditioned - remains at SNF.   4. Recent admission for urosepsis/pyelonephritis - followed by GU. Apparently has passed the >7 mm stone.   5. Diastolic heart failure - no signs of volume overload. I am stopping her Lasix.  6. Constipation - I think this is why she is nauseated - will add Miralax.   Patient is agreeable to this plan and will call if any problems develop in the interim.   Rosalio Macadamia, RN, ANP-C [facility] [address] [place], [place]  [address]

## 2012-07-20 NOTE — Patient Instructions (Addendum)
Please stop the Lasix  Please add Miralax 17 mg daily for constipation - Pundt change back to prn if needed  We are checking labs today  See Lawson Fiscal and Dr. Swaziland in 6 weeks  Call the [facility] office at [phone] if you have any questions, problems or concerns.

## 2012-07-23 ENCOUNTER — Telehealth: Payer: Self-pay | Admitting: *Deleted

## 2012-07-23 NOTE — Telephone Encounter (Signed)
S/w pts. Son is aware of lab results and repeat lab work, stated pt was still sick on her stomach stated they will treat pt at facility pt's son aware

## 2012-07-23 NOTE — Telephone Encounter (Signed)
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 Thomasene Lot, also stated sorry she was so scattered was doing 10 things at once I will get pts lab results faxed over to [facility] place with an order for a bmet and f/u on thursday

## 2012-07-26 ENCOUNTER — Telehealth: Payer: Self-pay | Admitting: Internal Medicine

## 2012-07-26 ENCOUNTER — Other Ambulatory Visit: Payer: Self-pay | Admitting: *Deleted

## 2012-07-26 ENCOUNTER — Encounter: Payer: Self-pay | Admitting: Internal Medicine

## 2012-07-26 DIAGNOSIS — Z79899 Other long term (current) drug therapy: Secondary | ICD-10-CM

## 2012-07-26 NOTE — Telephone Encounter (Signed)
New Prob      Wanted to notify you that she will call over with results when received.

## 2012-07-26 NOTE — Telephone Encounter (Signed)
S.w Kirstie Mirza Rn  Director of Nursing pt received repeat bmet and verbally received results pt now needs a repeat cbc/w diff and bmet for 7/14 I will put in order under Dr. Thomasene Lot

## 2012-08-06 ENCOUNTER — Ambulatory Visit (INDEPENDENT_AMBULATORY_CARE_PROVIDER_SITE_OTHER): Payer: Medicare Other | Admitting: Internal Medicine

## 2012-08-06 ENCOUNTER — Encounter: Payer: Self-pay | Admitting: Internal Medicine

## 2012-08-06 ENCOUNTER — Other Ambulatory Visit (INDEPENDENT_AMBULATORY_CARE_PROVIDER_SITE_OTHER): Payer: Medicare Other

## 2012-08-06 VITALS — BP 104/62 | HR 83 | Ht 64.0 in | Wt 121.6 lb

## 2012-08-06 DIAGNOSIS — I10 Essential (primary) hypertension: Secondary | ICD-10-CM

## 2012-08-06 DIAGNOSIS — I495 Sick sinus syndrome: Secondary | ICD-10-CM

## 2012-08-06 DIAGNOSIS — I4891 Unspecified atrial fibrillation: Secondary | ICD-10-CM

## 2012-08-06 DIAGNOSIS — Z79899 Other long term (current) drug therapy: Secondary | ICD-10-CM

## 2012-08-06 DIAGNOSIS — I480 Paroxysmal atrial fibrillation: Secondary | ICD-10-CM

## 2012-08-06 LAB — BASIC METABOLIC PANEL
Chloride: 103 mEq/L (ref 96–112)
GFR: 79.05 mL/min (ref >60.00)
Potassium: 3.6 mEq/L (ref 3.5–5.1)
Sodium: 139 mEq/L (ref 135–145)

## 2012-08-06 LAB — PACEMAKER DEVICE OBSERVATION
ATRIAL PACING PM: 85.3
BAMS-0001: 145 {beats}/min
BATTERY VOLTAGE: 2.79 V
RV LEAD THRESHOLD: 1.125 V
VENTRICULAR PACING PM: 0.4

## 2012-08-06 LAB — CBC WITH DIFFERENTIAL/PLATELET
Basophils Relative: 0.3 % (ref 0.0–3.0)
Eosinophils Relative: 1.9 % (ref 0.0–5.0)
HCT: 34.3 % — ABNORMAL LOW (ref 36.0–46.0)
Lymphs Abs: 1.8 10*3/uL (ref 0.7–4.0)
MCV: 95.4 fl (ref 78.0–100.0)
Monocytes Absolute: 0.7 10*3/uL (ref 0.1–1.0)
Monocytes Relative: 7 % (ref 3.0–12.0)
Neutrophils Relative %: 71.2 % (ref 43.0–77.0)
RBC: 3.6 Mil/uL — ABNORMAL LOW (ref 3.87–5.11)
WBC: 9.3 10*3/uL (ref 4.5–10.5)

## 2012-08-06 NOTE — Progress Notes (Signed)
PCP: Ezequiel Kayser, MD Primary Cardiologist: Peter Swaziland, MD  Jo [name] is a 79 y.o. female who presents today for routine electrophysiology followup.  Since last being seen in our clinic, she was hospitalized with urosepsis/pyelonephritis that was associated with respiratory failure, diastolic HF, AF with RVR. She was quite deconditioned and was sent to SNF.  She feels like she has been making progress with rehab.  She is feeling stronger and is hoping to go home in the next month or so.  She has had no further afib since hospital discharge.  Today, she denies symptoms of palpitations, chest pain, shortness of breath,  lower extremity edema, dizziness, presyncope, or syncope.  The patient is otherwise without complaint today.   Past Medical History  Diagnosis Date  . History of aortic insufficiency   . Anxiety   . Hypertension   . Pacemaker   . PAF (paroxysmal atrial fibrillation)     a. s/p pacemaker. b. s/p Medtronic gen change 07/2011.  . SSS (sick sinus syndrome)   . Type II diabetes mellitus     "controlled w/diet"  . Skin cancer ?1960's    "between breasts"  . Arthritis     "hands; between shoulders; back; feet"  . Panic attacks     "since losing husband 1994"  . Depression   . High risk medication use     on Flecainide  . Chronic anticoagulation   . Osteoporosis   . CHF (congestive heart failure)    Past Surgical History  Procedure Laterality Date  . Cesarean section  1955  . Cardiovascular stress test  12/09/2004  . Cataract extraction w/ intraocular lens  implant, bilateral  1990's  . Tonsillectomy      "school age"  . Appendectomy  1955  . Dilation and curettage of uterus  1953  . Breast cyst excision  ~ 1950    right  . Insert / replace / remove pacemaker  03/16/2006  . Skin cancer excision  ?1960's    "between my breasts"    Current Outpatient Prescriptions  Medication Sig Dispense Refill  . apixaban (ELIQUIS) 2.5 MG TABS tablet Take 1 tablet (2.5 mg  total) by mouth 2 (two) times daily.  60 tablet  4  . Calcium Carbonate (CALCIUM 600 PO) Take 600 mg by mouth daily.        . carvedilol (COREG) 6.25 MG tablet Take 3.125 mg by mouth 2 (two) times daily with a meal.      . diltiazem (CARDIZEM CD) 240 MG 24 hr capsule Take 1 capsule (240 mg total) by mouth daily.      Marland Kitchen docusate sodium (COLACE) 100 MG capsule Take 100 mg by mouth as needed.      Marland Kitchen estradiol (ESTRACE) 0.1 MG/GM vaginal cream Place 2 g vaginally 3 (three) times a week. Monday, Wednesday, friday      . flecainide (TAMBOCOR) 100 MG tablet Take 100 mg by mouth every 12 (twelve) hours. PT UNSURE IF SHE IS TAKING THIS      . fludrocortisone (FLORINEF) 0.1 MG tablet Take 0.1 tablets by mouth daily.      Marland Kitchen LORazepam (ATIVAN) 0.5 MG tablet Take one tablet by mouth at bedtime; Take 1/2 tablet in the morning for anxiety; Take 1/2 tablet by mouth evening for anxiety  60 tablet  5  . rosuvastatin (CRESTOR) 5 MG tablet Take 2.5 mg by mouth See admin instructions. Monday-Friday      . sertraline (ZOLOFT) 50 MG tablet Take  50 mg by mouth every morning.       . Vitamin D, Ergocalciferol, (DRISDOL) 50000 UNITS CAPS Take 50,000 Units by mouth every Wednesday.        No current facility-administered medications for this visit.    Physical Exam: Filed Vitals:   08/06/12 1018  BP: 104/62  Pulse: 83  Height: 5\' 4"  (1.626 m)  Weight: 121 lb 9.6 oz (55.157 kg)    GEN- The patient is well appearing, alert and oriented x 3 today.   Head- normocephalic, atraumatic Eyes-  Sclera clear, conjunctiva pink Ears- hearing intact Oropharynx- clear Lungs- Clear to ausculation bilaterally, normal work of breathing Chest- pacemaker pocket is well healed Heart- Regular rate and rhythm, no murmurs, rubs or gallops, PMI not laterally displaced GI- soft, NT, ND, + BS Extremities- no clubbing, cyanosis, or edema  Pacemaker interrogation- reviewed in detail today,  See PACEART report  Assessment and  Plan:  1. Sick sinus syndrome Normal pacemaker function See Pace Art report No changes today  2. Paroxysmal atrial fibrillation Maintaining sinus rhythm Continue eliquis  3. HTN Stable No change required today  Return to see Nehemiah Settle in the device clinic in 1 year

## 2012-08-06 NOTE — Patient Instructions (Addendum)
Remote monitoring is used to monitor your Pacemaker of ICD from home. This monitoring reduces the number of office visits required to check your device to one time per year. It allows us to keep an eye on the functioning of your device to ensure it is working properly. You are scheduled for a device check from home on November 05, 2012. You Sparger send your transmission at any time that day. If you have a wireless device, the transmission will be sent automatically. After your physician reviews your transmission, you will receive a postcard with your next transmission date.  Your physician wants you to follow-up in: 1 year with Dr Allred.  You will receive a reminder letter in the mail two months in advance. If you don't receive a letter, please call our office to schedule the follow-up appointment.  

## 2012-08-13 ENCOUNTER — Encounter: Payer: Self-pay | Admitting: Internal Medicine

## 2012-08-15 ENCOUNTER — Non-Acute Institutional Stay (SKILLED_NURSING_FACILITY): Payer: Medicare Other | Admitting: Adult Health

## 2012-08-15 ENCOUNTER — Encounter: Payer: Self-pay | Admitting: Adult Health

## 2012-08-15 DIAGNOSIS — F410 Panic disorder [episodic paroxysmal anxiety] without agoraphobia: Secondary | ICD-10-CM

## 2012-08-15 DIAGNOSIS — I5033 Acute on chronic diastolic (congestive) heart failure: Secondary | ICD-10-CM | POA: Diagnosis not present

## 2012-08-15 DIAGNOSIS — I4891 Unspecified atrial fibrillation: Secondary | ICD-10-CM | POA: Diagnosis not present

## 2012-08-15 DIAGNOSIS — E785 Hyperlipidemia, unspecified: Secondary | ICD-10-CM | POA: Diagnosis not present

## 2012-08-15 DIAGNOSIS — K5900 Constipation, unspecified: Secondary | ICD-10-CM

## 2012-08-15 DIAGNOSIS — I10 Essential (primary) hypertension: Secondary | ICD-10-CM | POA: Diagnosis not present

## 2012-08-15 DIAGNOSIS — E673 Hypervitaminosis D: Secondary | ICD-10-CM

## 2012-08-15 DIAGNOSIS — N952 Postmenopausal atrophic vaginitis: Secondary | ICD-10-CM

## 2012-08-15 NOTE — Assessment & Plan Note (Signed)
There is no change in her status will continue estrace 2 gm vaginally three times weekly; I am not certain if this medication if effective at her advanced age; however; will stop this medication at this time; as she is short term rehab; will continue to monitor her status and will make further changes in the future.

## 2012-08-15 NOTE — Assessment & Plan Note (Signed)
Her lasix was stopped by cardiology; will not make changes will monitor her status

## 2012-08-15 NOTE — Assessment & Plan Note (Addendum)
Her ldl is 85 and due to her advanced age she is  more  Than likely not receiving benefit from her crestor will stop this medication at this time and will monitor her status

## 2012-08-15 NOTE — Assessment & Plan Note (Signed)
She is presently stable is followed by facility psych services; will continue zoloft 50 mg daily; ativan 0.25 mg twice daily and 0.5 mg nightly and will continue to monitor

## 2012-08-15 NOTE — Assessment & Plan Note (Addendum)
Is stable will continue coreg 3.125 mg twice daily; diltiazem cd 240 mg daily  and will monitor

## 2012-08-15 NOTE — Assessment & Plan Note (Signed)
She is presently stable will continue eliquis 2.5 mg twice daily and coreg 3.125 mg twice daily for rate control will continue flecainide 100 mg twice daily for rate control and will monitor her status

## 2012-08-15 NOTE — Assessment & Plan Note (Signed)
Her vitamin d was stopped; her lat level was 123.88 on 08-03-12. Will continue to monitor her status will check iner level in one week.

## 2012-08-15 NOTE — Progress Notes (Signed)
Patient ID: Jo [name], female   DOB: 1917-09-29, 79 y.o.   MRN: [id number]  [facility]  Allergies  Allergen Reactions  . Sulfonamide Derivatives Rash    "it was all over my legs"  . Citalopram Hydrobromide     Pt does not remember reaction     Chief Complaint  Patient presents with  . Medical Managment of Chronic Issues    HPI: She is being seen for the management of her chronic illnesses; there are no concerns being voiced by the nursing staff; she states she is feeling well and is wanting to go home. She does understand that she will need to work with therapy for awhile before going home.   Past Medical History  Diagnosis Date  . History of aortic insufficiency   . Anxiety   . Hypertension   . Pacemaker   . PAF (paroxysmal atrial fibrillation)     a. s/p pacemaker. b. s/p Medtronic gen change 07/2011.  . SSS (sick sinus syndrome)   . Type II diabetes mellitus     "controlled w/diet"  . Skin cancer ?1960's    "between breasts"  . Arthritis     "hands; between shoulders; back; feet"  . Panic attacks     "since losing husband 1994"  . Depression   . High risk medication use     on Flecainide  . Chronic anticoagulation   . Osteoporosis   . CHF (congestive heart failure)     Past Surgical History  Procedure Laterality Date  . Cesarean section  1955  . Cardiovascular stress test  12/09/2004  . Cataract extraction w/ intraocular lens  implant, bilateral  1990's  . Tonsillectomy      "school age"  . Appendectomy  1955  . Dilation and curettage of uterus  1953  . Breast cyst excision  ~ 1950    right  . Insert / replace / remove pacemaker  03/16/2006  . Skin cancer excision  ?1960's    "between my breasts"    VITAL SIGNS Filed Vitals:   08/15/12 1355  BP: 108/67  Pulse: 70  Height: 5\' 2"  (1.575 m)  Weight: 121 lb (54.885 kg)     Patient's Medications  New Prescriptions   No medications on file  Previous Medications   APIXABAN (ELIQUIS) 2.5 MG TABS  TABLET    Take 1 tablet (2.5 mg total) by mouth 2 (two) times daily.   CALCIUM CARBONATE (CALCIUM 600 PO)    Take 600 mg by mouth daily.     CARVEDILOL (COREG) 6.25 MG TABLET    Take 3.125 mg by mouth 2 (two) times daily with a meal.   DILTIAZEM (CARDIZEM CD) 240 MG 24 HR CAPSULE    Take 1 capsule (240 mg total) by mouth daily.   DOCUSATE SODIUM (COLACE) 100 MG CAPSULE    Take 100 mg by mouth as needed.   ESTRADIOL (ESTRACE) 0.1 MG/GM VAGINAL CREAM    Place 2 g vaginally 3 (three) times a week. Monday, Wednesday, friday   FLECAINIDE (TAMBOCOR) 100 MG TABLET    Take 100 mg by mouth every 12 (twelve) hours. PT UNSURE IF SHE IS TAKING THIS   LORAZEPAM (ATIVAN) 0.5 MG TABLET    Take one tablet by mouth at bedtime; Take 1/2 tablet in the morning for anxiety; Take 1/2 tablet by mouth evening for anxiety   POLYETHYLENE GLYCOL (MIRALAX / GLYCOLAX) PACKET    Take 17 g by mouth daily as needed.  ROSUVASTATIN (CRESTOR) 5 MG TABLET    Take 2.5 mg by mouth See admin instructions. Monday-Friday   SERTRALINE (ZOLOFT) 50 MG TABLET    Take 50 mg by mouth every morning.   Modified Medications   No medications on file  Discontinued Medications   FLUDROCORTISONE (FLORINEF) 0.1 MG TABLET    Take 0.1 tablets by mouth daily.   VITAMIN D, ERGOCALCIFEROL, (DRISDOL) 50000 UNITS CAPS    Take 50,000 Units by mouth every Wednesday.     SIGNIFICANT DIAGNOSTIC EXAMS  07-06-12: acute abdomen:Prominent lung markings diffusely and bilaterally suggestive of fluid overload however diffuse infection also possible Nonobstructive bowel gas pattern.    07-06-12: ct angio of chest:1. Despite the limitations of this examination, there is no evidence to suggest clinically relevant central, lobar or segmental sized pulmonary embolism. 2. The appearance of the chest is most compatible with mild congestive heart failure. 3. More confluent consolidative airspace disease in the posterior aspect of the right lower lobe Goffe reflects sequelae of  mild aspiration or a developing site of infection. 4. Atherosclerosis, including two-vessel coronary artery disease. 5. Tracheobronchial malacia.  07-06-12: ct of abdomen and pelvis: 1. 7 mm partially obstructive calculus in the distal third of the left ureter with mild to moderate left-sided hydroureteronephrosis and extensive left-sided perinephric stranding. 2. 5.1 x 3.4 x 4.0 cm fatty attenuation mass in the right adrenal gland is compatible with a large myelolipoma. 3. In addition, there is a 2.5 x 3.2 x 2.6 cm mass in the left adrenal gland which by washout characteristics almost certainly represents an adenoma. If for some clinical reason there is concern for metastatic lesion or primary adrenal malignancy, correlation with noncontrast CT of the abdomen could be obtained (the patient has a pacemaker device and is not a candidate for MRI examination). 4. Atherosclerosis. 5. Minimal intrahepatic biliary ductal dilatation without evidence  of common bile duct or pancreatic duct dilatation. This finding is of uncertain etiology and significance, but Clodfelter be chronic in this patient. Correlation with liver function tests is suggested. 6. Small volume of perihepatic ascites.  07-06-12: renal ultrasound: 1. Mild to moderate left hydronephrosis with possible mild improvement (difficult to directly compare different modalities). 2. Cholelithiasis without evidence of cholecystitis.  3. Previously demonstrated large right adrenal myelolipoma.    LABS REVIEWED:   07-13-12: wbc 11.7; hgb 11.9; hct 36.6; mcv 96.8. ;plt 298; glucose 159; bun 25; creat 0.8; k+3.8 Na++136; chol 119; ldl 77; trig 87 07-16-12: vit d 218.29 07-26-12: glcuose 129; bun 12; creat 0.7; k+ 3.9; na++ 142 08-02-12: urine culture: e-coli: macrobid 08-03-12: vit d 123.88  08-06-12: wbc 9.3; hgb 11.6; hct 34.3 mcv 95.4 plt 282; glucose 116; bun 13; creat 0.7;k+3.6na++139   Review of Systems  Constitutional: Negative for malaise/fatigue.   Respiratory: Negative for cough and shortness of breath.   Cardiovascular: Negative for chest pain, palpitations and leg swelling.  Gastrointestinal: Negative for heartburn, abdominal pain and constipation.  Genitourinary: Negative for dysuria.  Musculoskeletal: Negative for myalgias and joint pain.  Skin: Negative.   Neurological: Negative for dizziness and headaches.  Psychiatric/Behavioral: Negative for depression. The patient does not have insomnia.     Physical Exam  Constitutional:  frail  Neck: Neck supple. No JVD present. No thyromegaly present.  Cardiovascular: Normal rate, regular rhythm and intact distal pulses.   Respiratory: Effort normal and breath sounds normal.  GI: Soft. Bowel sounds are normal. She exhibits no distension. There is no tenderness.  Musculoskeletal: Normal range of  motion. She exhibits no edema.  Neurological: She is alert.  Skin: Skin is warm and dry.  Psychiatric: She has a normal mood and affect.       ASSESSMENT/ PLAN: Panic attacks She is presently stable is followed by facility psych services; will continue zoloft 50 mg daily; ativan 0.25 mg twice daily and 0.5 mg nightly and will continue to monitor  Atrial fibrillation with RVR She is presently stable will continue eliquis 2.5 mg twice daily and coreg 3.125 mg twice daily for rate control will continue flecainide 100 mg twice daily for rate control and will monitor her status   Hypertension Is stable will continue coreg 3.125 mg twice daily; diltiazem cd 240 mg daily  and will monitor  Acute on chronic diastolic HF (heart failure) Her lasix was stopped by cardiology; will not make changes will monitor her status   Dyslipidemia Her ldl is 85 and due to her advanced age she is  more  Than likely not receiving benefit from her crestor will stop this medication at this time and will monitor her status   Atrophic vaginitis There is no change in her status will continue estrace 2 gm  vaginally three times weekly; I am not certain if this medication if effective at her advanced age; however; will stop this medication at this time; as she is short term rehab; will continue to monitor her status and will make further changes in the future.   Unspecified constipation Will continue miralax and colace daily as needed   Vitamin D intoxication Her vitamin d was stopped; her lat level was 123.88 on 08-03-12. Will continue to monitor her status will check iner level in one week.     Times spent with patient 50  Minutes

## 2012-08-15 NOTE — Assessment & Plan Note (Signed)
Will continue miralax and colace daily as needed

## 2012-08-23 ENCOUNTER — Encounter: Payer: Medicare Other | Admitting: Internal Medicine

## 2012-08-28 ENCOUNTER — Non-Acute Institutional Stay (SKILLED_NURSING_FACILITY): Payer: Medicare Other | Admitting: Adult Health

## 2012-08-28 DIAGNOSIS — I509 Heart failure, unspecified: Secondary | ICD-10-CM | POA: Diagnosis not present

## 2012-08-28 DIAGNOSIS — I4891 Unspecified atrial fibrillation: Secondary | ICD-10-CM | POA: Diagnosis not present

## 2012-08-28 DIAGNOSIS — I10 Essential (primary) hypertension: Secondary | ICD-10-CM

## 2012-08-31 ENCOUNTER — Ambulatory Visit (INDEPENDENT_AMBULATORY_CARE_PROVIDER_SITE_OTHER): Payer: Medicare Other | Admitting: Nurse Practitioner

## 2012-08-31 ENCOUNTER — Encounter: Payer: Self-pay | Admitting: Nurse Practitioner

## 2012-08-31 VITALS — BP 130/74 | HR 80 | Ht 60.0 in | Wt 127.4 lb

## 2012-08-31 DIAGNOSIS — I5032 Chronic diastolic (congestive) heart failure: Secondary | ICD-10-CM

## 2012-08-31 DIAGNOSIS — I4891 Unspecified atrial fibrillation: Secondary | ICD-10-CM

## 2012-08-31 DIAGNOSIS — I10 Essential (primary) hypertension: Secondary | ICD-10-CM

## 2012-08-31 NOTE — Patient Instructions (Addendum)
Stay on your current medicines  I think you are doing well   See Dr.Jordan back in 4 months.  Call the [facility] office at [phone] if you have any questions, problems or concerns.

## 2012-08-31 NOTE — Progress Notes (Signed)
Amedeo Plenty Hamstra Date of Birth: 03-26-1917 Medical Record #[id number]  History of Present Illness: Sherea is seen back today for a 6 week check. Seen for Dr. Swaziland. Has Ai, depression, diet controlled Dm, HTN and PAF with a pacemaker in place. Remains on Flecainide and Eliquis. Last generator change back in 2013.  Seen 6 weeks ago after being hospitalized for urosepsis. This was associated with respiratory failure, diastolic HF, AF with RVR. Ended up passing a pretty large kidney stone (> 7mm). Was sent to SNF due to being very deconditioned. When I saw her she was doing ok from our standpoint. Was quite constipated. BP was low. Weight was down over 9 pounds. We stopped her Lasix and added Miralax.   Seen here last month by Dr. Johney Frame - pacemaker check showed her to be maintaining sinus rhythm. She continues on Eliquis.   Comes back today. Here with her son. She is now back home. Doing ok. Feels ok. Still a little weak but making progress. No chest pain. Not short of breath. Remains off of her Lasix and is off of her Crestor as well. Some minor swelling but it is if she sits with her legs down all day and goes down overnight. No bleeding. Had labs about 3 weeks ago.   Current Outpatient Prescriptions  Medication Sig Dispense Refill  . apixaban (ELIQUIS) 2.5 MG TABS tablet Take 1 tablet (2.5 mg total) by mouth 2 (two) times daily.  60 tablet  4  . Calcium Carbonate (CALCIUM 600 PO) Take 600 mg by mouth daily.        . carvedilol (COREG) 6.25 MG tablet Take 3.125 mg by mouth 2 (two) times daily with a meal.      . diltiazem (CARDIZEM CD) 240 MG 24 hr capsule Take 1 capsule (240 mg total) by mouth daily.      Marland Kitchen docusate sodium (COLACE) 100 MG capsule Take 100 mg by mouth as needed.      Marland Kitchen estradiol (ESTRACE) 0.1 MG/GM vaginal cream Place 2 g vaginally 3 (three) times a week. Monday, Wednesday, friday      . flecainide (TAMBOCOR) 100 MG tablet Take 100 mg by mouth every 12 (twelve) hours. PT UNSURE IF  SHE IS TAKING THIS      . furosemide (LASIX) 40 MG tablet Take 40 mg by mouth.      Marland Kitchen LORazepam (ATIVAN) 0.5 MG tablet Take one tablet by mouth at bedtime; Take 1/2 tablet in the morning for anxiety; Take 1/2 tablet by mouth evening for anxiety  60 tablet  5  . polyethylene glycol (MIRALAX / GLYCOLAX) packet Take 17 g by mouth daily as needed.      . rosuvastatin (CRESTOR) 5 MG tablet Take 2.5 mg by mouth daily. Take Monday through Friday      . sertraline (ZOLOFT) 50 MG tablet Take 50 mg by mouth every morning.        No current facility-administered medications for this visit.    Allergies  Allergen Reactions  . Sulfonamide Derivatives Rash    "it was all over my legs"  . Citalopram Hydrobromide     Pt does not remember reaction    Past Medical History  Diagnosis Date  . History of aortic insufficiency   . Anxiety   . Hypertension   . Pacemaker   . PAF (paroxysmal atrial fibrillation)     a. s/p pacemaker. b. s/p Medtronic gen change 07/2011.  . SSS (sick sinus syndrome)   .  Type II diabetes mellitus     "controlled w/diet"  . Skin cancer ?1960's    "between breasts"  . Arthritis     "hands; between shoulders; back; feet"  . Panic attacks     "since losing husband 1994"  . Depression   . High risk medication use     on Flecainide  . Chronic anticoagulation   . Osteoporosis   . CHF (congestive heart failure)     Past Surgical History  Procedure Laterality Date  . Cesarean section  1955  . Cardiovascular stress test  12/09/2004  . Cataract extraction w/ intraocular lens  implant, bilateral  1990's  . Tonsillectomy      "school age"  . Appendectomy  1955  . Dilation and curettage of uterus  1953  . Breast cyst excision  ~ 1950    right  . Insert / replace / remove pacemaker  03/16/2006  . Skin cancer excision  ?1960's    "between my breasts"    History  Smoking status  . Never Smoker   Smokeless tobacco  . Never Used    History  Alcohol Use No     Family History  Problem Relation Age of Onset  . Emphysema Father     Review of Systems: The review of systems is per the HPI.  All other systems were reviewed and are negative.  Physical Exam: BP 130/74  Pulse 80  Ht 5' (1.524 m)  Wt 127 lb 6.4 oz (57.788 kg)  BMI 24.88 kg/m2 Patient is very pleasant and in no acute distress. Has gained 10 pounds. Skin is warm and dry. Color is normal.  HEENT is unremarkable. Normocephalic/atraumatic. PERRL. Sclera are nonicteric. Neck is supple. No masses. No JVD. Lungs are clear. Cardiac exam shows a regular rate and rhythm. Abdomen is soft. Extremities are without edema. Gait and ROM are intact. No gross neurologic deficits noted.  LABORATORY DATA:  Lab Results  Component Value Date   WBC 9.3 08/06/2012   HGB 11.6* 08/06/2012   HCT 34.3* 08/06/2012   PLT 282.0 08/06/2012   GLUCOSE 116* 08/06/2012   ALT 16 07/10/2012   AST 8 07/10/2012   NA 139 08/06/2012   K 3.6 08/06/2012   CL 103 08/06/2012   CREATININE 0.7 08/06/2012   BUN 13 08/06/2012   CO2 32 08/06/2012   INR 1.41 07/06/2012     Assessment / Plan:  1. PAF - has PPM in place. Remains on Flecainide and Eliquis.   2. HTN - BP is ok. No change in her current regimen.   3. Recent admission for urosepsis/pyelonephritis with multiple complication - making good progress - gaining weight and strength.   4. Diastolic HF - looks fairly compensated today. No change in her current therapy.   Will get her back to see Dr. Swaziland in 4 months.   Patient is agreeable to this plan and will call if any problems develop in the interim.   Rosalio Macadamia, RN, ANP-C [facility] [address] [place], [place]  [address]

## 2012-09-01 DIAGNOSIS — F411 Generalized anxiety disorder: Secondary | ICD-10-CM | POA: Diagnosis not present

## 2012-09-01 DIAGNOSIS — I4891 Unspecified atrial fibrillation: Secondary | ICD-10-CM | POA: Diagnosis not present

## 2012-09-01 DIAGNOSIS — R262 Difficulty in walking, not elsewhere classified: Secondary | ICD-10-CM | POA: Diagnosis not present

## 2012-09-01 DIAGNOSIS — I5033 Acute on chronic diastolic (congestive) heart failure: Secondary | ICD-10-CM | POA: Diagnosis not present

## 2012-09-01 DIAGNOSIS — I10 Essential (primary) hypertension: Secondary | ICD-10-CM | POA: Diagnosis not present

## 2012-09-01 DIAGNOSIS — M6281 Muscle weakness (generalized): Secondary | ICD-10-CM | POA: Diagnosis not present

## 2012-09-01 DIAGNOSIS — Z95810 Presence of automatic (implantable) cardiac defibrillator: Secondary | ICD-10-CM | POA: Diagnosis not present

## 2012-09-01 DIAGNOSIS — Z8701 Personal history of pneumonia (recurrent): Secondary | ICD-10-CM | POA: Diagnosis not present

## 2012-09-01 DIAGNOSIS — Z9181 History of falling: Secondary | ICD-10-CM | POA: Diagnosis not present

## 2012-09-01 DIAGNOSIS — E119 Type 2 diabetes mellitus without complications: Secondary | ICD-10-CM | POA: Diagnosis not present

## 2012-09-01 DIAGNOSIS — J9600 Acute respiratory failure, unspecified whether with hypoxia or hypercapnia: Secondary | ICD-10-CM | POA: Diagnosis not present

## 2012-09-01 DIAGNOSIS — M810 Age-related osteoporosis without current pathological fracture: Secondary | ICD-10-CM | POA: Diagnosis not present

## 2012-09-01 DIAGNOSIS — Z87440 Personal history of urinary (tract) infections: Secondary | ICD-10-CM | POA: Diagnosis not present

## 2012-09-03 DIAGNOSIS — E119 Type 2 diabetes mellitus without complications: Secondary | ICD-10-CM | POA: Diagnosis not present

## 2012-09-03 DIAGNOSIS — I4891 Unspecified atrial fibrillation: Secondary | ICD-10-CM | POA: Diagnosis not present

## 2012-09-03 DIAGNOSIS — I10 Essential (primary) hypertension: Secondary | ICD-10-CM | POA: Diagnosis not present

## 2012-09-03 DIAGNOSIS — M6281 Muscle weakness (generalized): Secondary | ICD-10-CM | POA: Diagnosis not present

## 2012-09-03 DIAGNOSIS — R262 Difficulty in walking, not elsewhere classified: Secondary | ICD-10-CM | POA: Diagnosis not present

## 2012-09-03 DIAGNOSIS — I5033 Acute on chronic diastolic (congestive) heart failure: Secondary | ICD-10-CM | POA: Diagnosis not present

## 2012-09-04 DIAGNOSIS — I10 Essential (primary) hypertension: Secondary | ICD-10-CM | POA: Diagnosis not present

## 2012-09-04 DIAGNOSIS — R262 Difficulty in walking, not elsewhere classified: Secondary | ICD-10-CM | POA: Diagnosis not present

## 2012-09-04 DIAGNOSIS — M6281 Muscle weakness (generalized): Secondary | ICD-10-CM | POA: Diagnosis not present

## 2012-09-04 DIAGNOSIS — I5033 Acute on chronic diastolic (congestive) heart failure: Secondary | ICD-10-CM | POA: Diagnosis not present

## 2012-09-04 DIAGNOSIS — I4891 Unspecified atrial fibrillation: Secondary | ICD-10-CM | POA: Diagnosis not present

## 2012-09-04 DIAGNOSIS — E119 Type 2 diabetes mellitus without complications: Secondary | ICD-10-CM | POA: Diagnosis not present

## 2012-09-06 DIAGNOSIS — I5033 Acute on chronic diastolic (congestive) heart failure: Secondary | ICD-10-CM | POA: Diagnosis not present

## 2012-09-06 DIAGNOSIS — E119 Type 2 diabetes mellitus without complications: Secondary | ICD-10-CM | POA: Diagnosis not present

## 2012-09-06 DIAGNOSIS — R262 Difficulty in walking, not elsewhere classified: Secondary | ICD-10-CM | POA: Diagnosis not present

## 2012-09-06 DIAGNOSIS — I4891 Unspecified atrial fibrillation: Secondary | ICD-10-CM | POA: Diagnosis not present

## 2012-09-06 DIAGNOSIS — M6281 Muscle weakness (generalized): Secondary | ICD-10-CM | POA: Diagnosis not present

## 2012-09-06 DIAGNOSIS — I10 Essential (primary) hypertension: Secondary | ICD-10-CM | POA: Diagnosis not present

## 2012-09-07 DIAGNOSIS — I5033 Acute on chronic diastolic (congestive) heart failure: Secondary | ICD-10-CM | POA: Diagnosis not present

## 2012-09-07 DIAGNOSIS — M6281 Muscle weakness (generalized): Secondary | ICD-10-CM | POA: Diagnosis not present

## 2012-09-07 DIAGNOSIS — I4891 Unspecified atrial fibrillation: Secondary | ICD-10-CM | POA: Diagnosis not present

## 2012-09-07 DIAGNOSIS — E119 Type 2 diabetes mellitus without complications: Secondary | ICD-10-CM | POA: Diagnosis not present

## 2012-09-07 DIAGNOSIS — I10 Essential (primary) hypertension: Secondary | ICD-10-CM | POA: Diagnosis not present

## 2012-09-07 DIAGNOSIS — R262 Difficulty in walking, not elsewhere classified: Secondary | ICD-10-CM | POA: Diagnosis not present

## 2012-09-11 DIAGNOSIS — I5033 Acute on chronic diastolic (congestive) heart failure: Secondary | ICD-10-CM | POA: Diagnosis not present

## 2012-09-11 DIAGNOSIS — M6281 Muscle weakness (generalized): Secondary | ICD-10-CM | POA: Diagnosis not present

## 2012-09-11 DIAGNOSIS — E119 Type 2 diabetes mellitus without complications: Secondary | ICD-10-CM | POA: Diagnosis not present

## 2012-09-11 DIAGNOSIS — R262 Difficulty in walking, not elsewhere classified: Secondary | ICD-10-CM | POA: Diagnosis not present

## 2012-09-11 DIAGNOSIS — I4891 Unspecified atrial fibrillation: Secondary | ICD-10-CM | POA: Diagnosis not present

## 2012-09-11 DIAGNOSIS — I10 Essential (primary) hypertension: Secondary | ICD-10-CM | POA: Diagnosis not present

## 2012-09-12 DIAGNOSIS — R262 Difficulty in walking, not elsewhere classified: Secondary | ICD-10-CM | POA: Diagnosis not present

## 2012-09-12 DIAGNOSIS — I5033 Acute on chronic diastolic (congestive) heart failure: Secondary | ICD-10-CM | POA: Diagnosis not present

## 2012-09-12 DIAGNOSIS — I10 Essential (primary) hypertension: Secondary | ICD-10-CM | POA: Diagnosis not present

## 2012-09-12 DIAGNOSIS — E119 Type 2 diabetes mellitus without complications: Secondary | ICD-10-CM | POA: Diagnosis not present

## 2012-09-12 DIAGNOSIS — I4891 Unspecified atrial fibrillation: Secondary | ICD-10-CM | POA: Diagnosis not present

## 2012-09-12 DIAGNOSIS — M6281 Muscle weakness (generalized): Secondary | ICD-10-CM | POA: Diagnosis not present

## 2012-09-13 DIAGNOSIS — M6281 Muscle weakness (generalized): Secondary | ICD-10-CM | POA: Diagnosis not present

## 2012-09-13 DIAGNOSIS — E119 Type 2 diabetes mellitus without complications: Secondary | ICD-10-CM | POA: Diagnosis not present

## 2012-09-13 DIAGNOSIS — R262 Difficulty in walking, not elsewhere classified: Secondary | ICD-10-CM | POA: Diagnosis not present

## 2012-09-13 DIAGNOSIS — I10 Essential (primary) hypertension: Secondary | ICD-10-CM | POA: Diagnosis not present

## 2012-09-13 DIAGNOSIS — I5033 Acute on chronic diastolic (congestive) heart failure: Secondary | ICD-10-CM | POA: Diagnosis not present

## 2012-09-13 DIAGNOSIS — I4891 Unspecified atrial fibrillation: Secondary | ICD-10-CM | POA: Diagnosis not present

## 2012-09-16 ENCOUNTER — Encounter: Payer: Self-pay | Admitting: Adult Health

## 2012-09-16 NOTE — Progress Notes (Signed)
Patient ID: Jo [name], female   DOB: 08-16-1917, 79 y.o.   MRN: [id number]  [facility]  Allergies  Allergen Reactions  . Sulfonamide Derivatives Rash    "it was all over my legs"  . Citalopram Hydrobromide     Pt does not remember reaction     Chief Complaint  Patient presents with  . Discharge Note    HPI: She is being discharged to home after being hospitalized for chf; pyelonephritis. She has a prolonged rehab at this facility and is ready for discharge home with family. She will need home health for pt/ot/nursing. She will need a rollater in order to maintain her independence with adl's such as grooming and dressing. Will need prescriptions written.   Past Medical History  Diagnosis Date  . History of aortic insufficiency   . Anxiety   . Hypertension   . Pacemaker   . PAF (paroxysmal atrial fibrillation)     a. s/p pacemaker. b. s/p Medtronic gen change 07/2011.  . SSS (sick sinus syndrome)   . Type II diabetes mellitus     "controlled w/diet"  . Skin cancer ?1960's    "between breasts"  . Arthritis     "hands; between shoulders; back; feet"  . Panic attacks     "since losing husband 1994"  . Depression   . High risk medication use     on Flecainide  . Chronic anticoagulation   . Osteoporosis   . CHF (congestive heart failure)     Past Surgical History  Procedure Laterality Date  . Cesarean section  1955  . Cardiovascular stress test  12/09/2004  . Cataract extraction w/ intraocular lens  implant, bilateral  1990's  . Tonsillectomy      "school age"  . Appendectomy  1955  . Dilation and curettage of uterus  1953  . Breast cyst excision  ~ 1950    right  . Insert / replace / remove pacemaker  03/16/2006  . Skin cancer excision  ?1960's    "between my breasts"    VITAL SIGNS BP 117/56  Pulse 68  Ht 5\' 2"  (1.575 m)  Wt 121 lb (54.885 kg)  BMI 22.13 kg/m2   Patient's Medications  New Prescriptions   No medications on file  Previous Medications    APIXABAN (ELIQUIS) 2.5 MG TABS TABLET    Take 1 tablet (2.5 mg total) by mouth 2 (two) times daily.   CALCIUM CARBONATE (CALCIUM 600 PO)    Take 600 mg by mouth daily.     CARVEDILOL (COREG) 6.25 MG TABLET    Take 3.125 mg by mouth 2 (two) times daily with a meal.   DILTIAZEM (CARDIZEM CD) 240 MG 24 HR CAPSULE    Take 1 capsule (240 mg total) by mouth daily.   DOCUSATE SODIUM (COLACE) 100 MG CAPSULE    Take 100 mg by mouth as needed.   ESTRADIOL (ESTRACE) 0.1 MG/GM VAGINAL CREAM    Place 2 g vaginally 3 (three) times a week. Monday, Wednesday, friday   FLECAINIDE (TAMBOCOR) 100 MG TABLET    Take 100 mg by mouth every 12 (twelve) hours. PT UNSURE IF SHE IS TAKING THIS   LORAZEPAM (ATIVAN) 0.5 MG TABLET    Take one tablet by mouth at bedtime; Take 1/2 tablet in the morning for anxiety; Take 1/2 tablet by mouth evening for anxiety   POLYETHYLENE GLYCOL (MIRALAX / GLYCOLAX) PACKET    Take 17 g by mouth daily as needed.  SERTRALINE (ZOLOFT) 50 MG TABLET    Take 50 mg by mouth every morning.   Modified Medications   No medications on file  Discontinued Medications   No medications on file    SIGNIFICANT DIAGNOSTIC EXAMS    07-06-12: acute abdomen:Prominent lung markings diffusely and bilaterally suggestive of fluid overload however diffuse infection also possible Nonobstructive bowel gas pattern.    07-06-12: ct angio of chest:1. Despite the limitations of this examination, there is no evidence to suggest clinically relevant central, lobar or segmental sized pulmonary embolism. 2. The appearance of the chest is most compatible with mild congestive heart failure. 3. More confluent consolidative airspace disease in the posterior aspect of the right lower lobe Agrusa reflects sequelae of mild aspiration or a developing site of infection. 4. Atherosclerosis, including two-vessel coronary artery disease. 5. Tracheobronchial malacia.  07-06-12: ct of abdomen and pelvis: 1. 7 mm partially obstructive  calculus in the distal third of the left ureter with mild to moderate left-sided hydroureteronephrosis and extensive left-sided perinephric stranding. 2. 5.1 x 3.4 x 4.0 cm fatty attenuation mass in the right adrenal gland is compatible with a large myelolipoma. 3. In addition, there is a 2.5 x 3.2 x 2.6 cm mass in the left adrenal gland which by washout characteristics almost certainly represents an adenoma. If for some clinical reason there is concern for metastatic lesion or primary adrenal malignancy, correlation with noncontrast CT of the abdomen could be obtained (the patient has a pacemaker device and is not a candidate for MRI examination). 4. Atherosclerosis. 5. Minimal intrahepatic biliary ductal dilatation without evidence  of common bile duct or pancreatic duct dilatation. This finding is of uncertain etiology and significance, but Voyles be chronic in this patient. Correlation with liver function tests is suggested. 6. Small volume of perihepatic ascites.  07-06-12: renal ultrasound: 1. Mild to moderate left hydronephrosis with possible mild improvement (difficult to directly compare different modalities). 2. Cholelithiasis without evidence of cholecystitis.  3. Previously demonstrated large right adrenal myelolipoma.    LABS REVIEWED:   07-13-12: wbc 11.7; hgb 11.9; hct 36.6; mcv 96.8. ;plt 298; glucose 159; bun 25; creat 0.8; k+3.8 Na++136; chol 119; ldl 77; trig 87 07-16-12: vit d 218.29 07-26-12: glcuose 129; bun 12; creat 0.7; k+ 3.9; na++ 142 08-02-12: urine culture: e-coli: macrobid 08-03-12: vit d 123.88  08-06-12: wbc 9.3; hgb 11.6; hct 34.3 mcv 95.4 plt 282; glucose 116; bun 13; creat 0.7;k+3.6na++139   Review of Systems  Constitutional: Negative for malaise/fatigue.  Respiratory: Negative for cough and shortness of breath.   Cardiovascular: Negative for chest pain, palpitations and leg swelling.  Gastrointestinal: Negative for heartburn, abdominal pain and constipation.   Genitourinary: Negative for dysuria.  Musculoskeletal: Negative for myalgias and joint pain.  Skin: Negative.   Neurological: Negative for dizziness and headaches.  Psychiatric/Behavioral: Negative for depression. The patient does not have insomnia.     Physical Exam  Constitutional:  frail  Neck: Neck supple. No JVD present. No thyromegaly present.  Cardiovascular: Normal rate, regular rhythm and intact distal pulses.   Respiratory: Effort normal and breath sounds normal.  GI: Soft. Bowel sounds are normal. She exhibits no distension. There is no tenderness.  Musculoskeletal: Normal range of motion. She exhibits no edema.  Neurological: She is alert.  Skin: Skin is warm and dry.  Psychiatric: She has a normal mood and affect.       ASSESSMENT/ PLAN:  Will discharge to home with home health for pt/ot/nursing; will need a  rollater for ambulation in order to maintain independence with adl's such as bathing and grooming. Prescriptions written.   Time spent with patient: 40 minutes.

## 2012-09-18 DIAGNOSIS — I10 Essential (primary) hypertension: Secondary | ICD-10-CM | POA: Diagnosis not present

## 2012-09-18 DIAGNOSIS — R262 Difficulty in walking, not elsewhere classified: Secondary | ICD-10-CM | POA: Diagnosis not present

## 2012-09-18 DIAGNOSIS — I4891 Unspecified atrial fibrillation: Secondary | ICD-10-CM | POA: Diagnosis not present

## 2012-09-18 DIAGNOSIS — M6281 Muscle weakness (generalized): Secondary | ICD-10-CM | POA: Diagnosis not present

## 2012-09-18 DIAGNOSIS — E119 Type 2 diabetes mellitus without complications: Secondary | ICD-10-CM | POA: Diagnosis not present

## 2012-09-18 DIAGNOSIS — I5033 Acute on chronic diastolic (congestive) heart failure: Secondary | ICD-10-CM | POA: Diagnosis not present

## 2012-09-19 DIAGNOSIS — I4891 Unspecified atrial fibrillation: Secondary | ICD-10-CM | POA: Diagnosis not present

## 2012-09-19 DIAGNOSIS — R262 Difficulty in walking, not elsewhere classified: Secondary | ICD-10-CM | POA: Diagnosis not present

## 2012-09-19 DIAGNOSIS — I5033 Acute on chronic diastolic (congestive) heart failure: Secondary | ICD-10-CM | POA: Diagnosis not present

## 2012-09-19 DIAGNOSIS — I10 Essential (primary) hypertension: Secondary | ICD-10-CM | POA: Diagnosis not present

## 2012-09-19 DIAGNOSIS — M6281 Muscle weakness (generalized): Secondary | ICD-10-CM | POA: Diagnosis not present

## 2012-09-19 DIAGNOSIS — E119 Type 2 diabetes mellitus without complications: Secondary | ICD-10-CM | POA: Diagnosis not present

## 2012-09-20 DIAGNOSIS — I4891 Unspecified atrial fibrillation: Secondary | ICD-10-CM | POA: Diagnosis not present

## 2012-09-20 DIAGNOSIS — R262 Difficulty in walking, not elsewhere classified: Secondary | ICD-10-CM | POA: Diagnosis not present

## 2012-09-20 DIAGNOSIS — E119 Type 2 diabetes mellitus without complications: Secondary | ICD-10-CM | POA: Diagnosis not present

## 2012-09-20 DIAGNOSIS — M6281 Muscle weakness (generalized): Secondary | ICD-10-CM | POA: Diagnosis not present

## 2012-09-20 DIAGNOSIS — I10 Essential (primary) hypertension: Secondary | ICD-10-CM | POA: Diagnosis not present

## 2012-09-20 DIAGNOSIS — I5033 Acute on chronic diastolic (congestive) heart failure: Secondary | ICD-10-CM | POA: Diagnosis not present

## 2012-09-25 DIAGNOSIS — I5033 Acute on chronic diastolic (congestive) heart failure: Secondary | ICD-10-CM | POA: Diagnosis not present

## 2012-09-25 DIAGNOSIS — E119 Type 2 diabetes mellitus without complications: Secondary | ICD-10-CM | POA: Diagnosis not present

## 2012-09-25 DIAGNOSIS — M6281 Muscle weakness (generalized): Secondary | ICD-10-CM | POA: Diagnosis not present

## 2012-09-25 DIAGNOSIS — R262 Difficulty in walking, not elsewhere classified: Secondary | ICD-10-CM | POA: Diagnosis not present

## 2012-09-25 DIAGNOSIS — I4891 Unspecified atrial fibrillation: Secondary | ICD-10-CM | POA: Diagnosis not present

## 2012-09-25 DIAGNOSIS — I10 Essential (primary) hypertension: Secondary | ICD-10-CM | POA: Diagnosis not present

## 2012-09-26 DIAGNOSIS — E119 Type 2 diabetes mellitus without complications: Secondary | ICD-10-CM | POA: Diagnosis not present

## 2012-09-26 DIAGNOSIS — I10 Essential (primary) hypertension: Secondary | ICD-10-CM | POA: Diagnosis not present

## 2012-09-26 DIAGNOSIS — I5033 Acute on chronic diastolic (congestive) heart failure: Secondary | ICD-10-CM | POA: Diagnosis not present

## 2012-09-26 DIAGNOSIS — M6281 Muscle weakness (generalized): Secondary | ICD-10-CM | POA: Diagnosis not present

## 2012-09-26 DIAGNOSIS — I4891 Unspecified atrial fibrillation: Secondary | ICD-10-CM | POA: Diagnosis not present

## 2012-09-26 DIAGNOSIS — R262 Difficulty in walking, not elsewhere classified: Secondary | ICD-10-CM | POA: Diagnosis not present

## 2012-09-27 ENCOUNTER — Other Ambulatory Visit: Payer: Self-pay | Admitting: Adult Health

## 2012-10-04 ENCOUNTER — Other Ambulatory Visit: Payer: Self-pay | Admitting: Adult Health

## 2012-10-17 DIAGNOSIS — I10 Essential (primary) hypertension: Secondary | ICD-10-CM | POA: Diagnosis not present

## 2012-10-17 DIAGNOSIS — Z23 Encounter for immunization: Secondary | ICD-10-CM | POA: Diagnosis not present

## 2012-10-17 DIAGNOSIS — I4891 Unspecified atrial fibrillation: Secondary | ICD-10-CM | POA: Diagnosis not present

## 2012-10-17 DIAGNOSIS — E119 Type 2 diabetes mellitus without complications: Secondary | ICD-10-CM | POA: Diagnosis not present

## 2012-10-17 DIAGNOSIS — M810 Age-related osteoporosis without current pathological fracture: Secondary | ICD-10-CM | POA: Diagnosis not present

## 2012-10-17 DIAGNOSIS — E785 Hyperlipidemia, unspecified: Secondary | ICD-10-CM | POA: Diagnosis not present

## 2012-10-26 ENCOUNTER — Other Ambulatory Visit: Payer: Self-pay | Admitting: Adult Health

## 2012-11-02 DIAGNOSIS — H2640 Unspecified secondary cataract: Secondary | ICD-10-CM | POA: Diagnosis not present

## 2012-11-05 ENCOUNTER — Ambulatory Visit (INDEPENDENT_AMBULATORY_CARE_PROVIDER_SITE_OTHER): Payer: Medicare Other | Admitting: *Deleted

## 2012-11-05 DIAGNOSIS — I495 Sick sinus syndrome: Secondary | ICD-10-CM

## 2012-11-06 LAB — REMOTE PACEMAKER DEVICE
AL IMPEDENCE PM: 523 Ohm
AL THRESHOLD: 0.5 V
ATRIAL PACING PM: 98
RV LEAD THRESHOLD: 0.875 V

## 2012-11-08 ENCOUNTER — Encounter (HOSPITAL_COMMUNITY): Payer: Self-pay

## 2012-11-08 ENCOUNTER — Encounter (HOSPITAL_COMMUNITY)
Admission: RE | Admit: 2012-11-08 | Discharge: 2012-11-08 | Disposition: A | Discharge status: Home / Self Care | Payer: Medicare Other | Source: Ambulatory Visit | Attending: Internal Medicine | Admitting: Internal Medicine

## 2012-11-08 DIAGNOSIS — M810 Age-related osteoporosis without current pathological fracture: Secondary | ICD-10-CM | POA: Diagnosis not present

## 2012-11-08 HISTORY — DX: Calculus of kidney: N20.0

## 2012-11-08 MED ORDER — ZOLEDRONIC ACID 5 MG/100ML IV SOLN
5.0000 mg | Freq: Once | INTRAVENOUS | Status: AC
  Administered 2012-11-08: 5 mg via INTRAVENOUS
  Filled 2012-11-08: qty 100

## 2012-11-08 MED ORDER — SODIUM CHLORIDE 0.9 % IV SOLN
Freq: Once | INTRAVENOUS | Status: AC
  Administered 2012-11-08: 15:00:00 via INTRAVENOUS

## 2012-11-08 NOTE — Discharge Instructions (Signed)
Zoledronic Acid injection (Paget's Disease, Osteoporosis) What is this medicine? ZOLEDRONIC ACID (ZOE le dron ik AS id) lowers the amount of calcium loss from bone. It is used to treat Paget's disease and osteoporosis in women. This medicine Debow be used for other purposes; ask your health care provider or pharmacist if you have questions. What should I tell my health care provider before I take this medicine? They need to know if you have any of these conditions: -aspirin-sensitive asthma -dental disease -kidney disease -low levels of calcium in the blood -past surgery on the parathyroid gland or intestines -an unusual or allergic reaction to zoledronic acid, other medicines, foods, dyes, or preservatives -pregnant or trying to get pregnant -breast-feeding How should I use this medicine? This medicine is for infusion into a vein. It is given by a health care professional in a hospital or clinic setting. Talk to your pediatrician regarding the use of this medicine in children. This medicine is not approved for use in children. Overdosage: If you think you have taken too much of this medicine contact a poison control center or emergency room at once. NOTE: This medicine is only for you. Do not share this medicine with others. What if I miss a dose? It is important not to miss your dose. Call your doctor or health care professional if you are unable to keep an appointment. What Reichenbach interact with this medicine? -certain antibiotics given by injection -NSAIDs, medicines for pain and inflammation, like ibuprofen or naproxen -some diuretics like bumetanide, furosemide -teriparatide This list Amend not describe all possible interactions. Give your health care provider a list of all the medicines, herbs, non-prescription drugs, or dietary supplements you use. Also tell them if you smoke, drink alcohol, or use illegal drugs. Some items Geske interact with your medicine. What should I watch for while  using this medicine? Visit your doctor or health care professional for regular checkups. It Larimer be some time before you see the benefit from this medicine. Do not stop taking your medicine unless your doctor tells you to. Your doctor Gallentine order blood tests or other tests to see how you are doing. Women should inform their doctor if they wish to become pregnant or think they might be pregnant. There is a potential for serious side effects to an unborn child. Talk to your health care professional or pharmacist for more information. You should make sure that you get enough calcium and vitamin D while you are taking this medicine. Discuss the foods you eat and the vitamins you take with your health care professional. Some people who take this medicine have severe bone, joint, and/or muscle pain. This medicine Vanderweide also increase your risk for a broken thigh bone. Tell your doctor right away if you have pain in your upper leg or groin. Tell your doctor if you have any pain that does not go away or that gets worse. What side effects Gappa I notice from receiving this medicine? Side effects that you should report to your doctor or health care professional as soon as possible: -allergic reactions like skin rash, itching or hives, swelling of the face, lips, or tongue -breathing problems -changes in vision -feeling faint or lightheaded, falls -jaw burning, cramping, or pain -muscle cramps, stiffness, or weakness -trouble passing urine or change in the amount of urine Side effects that usually do not require medical attention (report to your doctor or health care professional if they continue or are bothersome): -bone, joint, or muscle pain -fever -  irritation at site where injected -loss of appetite -nausea, vomiting -stomach upset -tired This list Sutch not describe all possible side effects. Call your doctor for medical advice about side effects. You Sienkiewicz report side effects to FDA at 1-800-FDA-1088. Where  should I keep my medicine? This drug is given in a hospital or clinic and will not be stored at home. NOTE: This sheet is a summary. It Perret not cover all possible information. If you have questions about this medicine, talk to your doctor, pharmacist, or health care provider.  2012, Elsevier/Gold Standard. (07/09/2010 9:08:15 AM) 

## 2012-11-13 ENCOUNTER — Telehealth: Payer: Self-pay | Admitting: Cardiology

## 2012-11-13 NOTE — Telephone Encounter (Signed)
New message   Did you get the device reading from 10-13--(remote)?   Pt said no one called her to let her know

## 2012-11-13 NOTE — Telephone Encounter (Signed)
Transmission received, patient aware. 

## 2012-11-19 ENCOUNTER — Encounter: Payer: Self-pay | Admitting: Internal Medicine

## 2012-11-22 ENCOUNTER — Encounter: Payer: Self-pay | Admitting: Internal Medicine

## 2012-11-30 ENCOUNTER — Other Ambulatory Visit: Payer: Self-pay | Admitting: Internal Medicine

## 2012-12-26 ENCOUNTER — Ambulatory Visit (INDEPENDENT_AMBULATORY_CARE_PROVIDER_SITE_OTHER): Payer: Medicare Other | Admitting: Cardiology

## 2012-12-26 ENCOUNTER — Encounter: Payer: Self-pay | Admitting: Cardiology

## 2012-12-26 VITALS — BP 126/60 | HR 75 | Ht 62.0 in | Wt 121.0 lb

## 2012-12-26 DIAGNOSIS — I10 Essential (primary) hypertension: Secondary | ICD-10-CM | POA: Diagnosis not present

## 2012-12-26 DIAGNOSIS — I4891 Unspecified atrial fibrillation: Secondary | ICD-10-CM

## 2012-12-26 DIAGNOSIS — I495 Sick sinus syndrome: Secondary | ICD-10-CM

## 2012-12-26 DIAGNOSIS — I480 Paroxysmal atrial fibrillation: Secondary | ICD-10-CM

## 2012-12-26 NOTE — Progress Notes (Signed)
Jo [name] Date of Birth: November 16, 1917 Medical Record #[id number]  History of Present Illness: Jo [name] is seen back today for a follow up visit. She has a history of AI, depression, diet controlled Dm, HTN and PAF with a pacemaker in place. Remains on Flecainide and Eliquis. Last generator change back in 2013. Seen in June after being hospitalized for urosepsis. This was associated with respiratory failure, diastolic HF, AF with RVR. Ended up passing a pretty large kidney stone (> 7mm). She reports she is doing well at this time. She is eating well. She denies any palpitations, dyspnea, or chest pain. Last Friday she felt lightheaded and complained of her ears burning. Her blood pressure was quite high but this resolved after she took a half a dose of Ativan.  Current Outpatient Prescriptions  Medication Sig Dispense Refill  . Calcium Carbonate (CALCIUM 600 PO) Take 600 mg by mouth daily.        . carvedilol (COREG) 3.125 MG tablet Take 3.125 mg by mouth 2 (two) times daily with a meal.      . diltiazem (CARDIZEM CD) 240 MG 24 hr capsule Take 1 capsule (240 mg total) by mouth daily.      Marland Kitchen ELIQUIS 2.5 MG TABS tablet TAKE 1 TABLET BY MOUTH TWICE A DAY  60 tablet  1  . estradiol (ESTRACE) 0.1 MG/GM vaginal cream Place 2 g vaginally 3 (three) times a week. Monday, Wednesday, friday      . flecainide (TAMBOCOR) 50 MG tablet Take 50 mg by mouth 2 (two) times daily.      Marland Kitchen LORazepam (ATIVAN) 0.5 MG tablet Take one tablet by mouth at bedtime; Take 1/2 tablet in the morning for anxiety; Take 1/2 tablet by mouth evening for anxiety  60 tablet  5  . rosuvastatin (CRESTOR) 5 MG tablet daily. TAKE 1/2 TAB ON M-W-F      . sertraline (ZOLOFT) 50 MG tablet Take 50 mg by mouth every morning.        No current facility-administered medications for this visit.    Allergies  Allergen Reactions  . Sulfonamide Derivatives Rash    "it was all over my legs"  . Citalopram Hydrobromide     Pt does not remember  reaction    Past Medical History  Diagnosis Date  . History of aortic insufficiency   . Anxiety   . Hypertension   . Pacemaker   . PAF (paroxysmal atrial fibrillation)     a. s/p pacemaker. b. s/p Medtronic gen change 07/2011.  . SSS (sick sinus syndrome)   . Type II diabetes mellitus     "controlled w/diet"  . Skin cancer ?1960's    "between breasts"  . Arthritis     "hands; between shoulders; back; feet"  . Panic attacks     "since losing husband 1994"  . Depression   . High risk medication use     on Flecainide  . Chronic anticoagulation   . Osteoporosis   . CHF (congestive heart failure)   . Kidney stone     pased in [place] 2014    Past Surgical History  Procedure Laterality Date  . Cesarean section  1955  . Cardiovascular stress test  12/09/2004  . Cataract extraction w/ intraocular lens  implant, bilateral  1990's  . Tonsillectomy      "school age"  . Appendectomy  1955  . Dilation and curettage of uterus  1953  . Breast cyst excision  ~ 1950  right  . Insert / replace / remove pacemaker  03/16/2006  . Skin cancer excision  ?1960's    "between my breasts"    History  Smoking status  . Never Smoker   Smokeless tobacco  . Never Used    History  Alcohol Use No    Family History  Problem Relation Age of Onset  . Emphysema Father     Review of Systems: The review of systems is per the HPI.  All other systems were reviewed and are negative.  Physical Exam: BP 126/60  Pulse 75  Ht 5\' 2"  (1.575 m)  Wt 121 lb (54.885 kg)  BMI 22.13 kg/m2 Patient is very pleasant and in no acute distress. She is hard of hearing. Skin is warm and dry. Color is normal.  HEENT is unremarkable. Normocephalic/atraumatic. PERRL. Sclera are nonicteric. Neck is supple. No masses. No JVD. Lungs are clear. Cardiac exam shows a regular rate and rhythm. Abdomen is soft. Extremities are without edema. Gait and ROM are intact. No gross neurologic deficits noted.  LABORATORY  DATA:  Lab Results  Component Value Date   WBC 9.3 08/06/2012   HGB 11.6* 08/06/2012   HCT 34.3* 08/06/2012   PLT 282.0 08/06/2012   GLUCOSE 116* 08/06/2012   ALT 16 07/10/2012   AST 8 07/10/2012   NA 139 08/06/2012   K 3.6 08/06/2012   CL 103 08/06/2012   CREATININE 0.7 08/06/2012   BUN 13 08/06/2012   CO2 32 08/06/2012   INR 1.41 07/06/2012     Assessment / Plan:  1. PAF - has PPM in place. Remains on Flecainide 50 mg twice a day and Eliquis. He is maintaining sinus rhythm well. Last pacemaker check in October was satisfactory.  2. HTN - BP is well controlled.  3. Chronic Diastolic HF - looks fairly compensated today. No change in her current therapy.

## 2012-12-26 NOTE — Patient Instructions (Signed)
Continue your current therapy  I will see you in 6 months with an Ecg   

## 2013-01-24 ENCOUNTER — Other Ambulatory Visit: Payer: Self-pay | Admitting: Cardiology

## 2013-02-06 ENCOUNTER — Encounter: Payer: Medicare Other | Admitting: *Deleted

## 2013-02-11 ENCOUNTER — Encounter: Payer: Self-pay | Admitting: *Deleted

## 2013-02-19 ENCOUNTER — Ambulatory Visit (INDEPENDENT_AMBULATORY_CARE_PROVIDER_SITE_OTHER): Payer: Medicare Other | Admitting: *Deleted

## 2013-02-19 DIAGNOSIS — I495 Sick sinus syndrome: Secondary | ICD-10-CM

## 2013-02-19 DIAGNOSIS — I4891 Unspecified atrial fibrillation: Secondary | ICD-10-CM | POA: Diagnosis not present

## 2013-02-19 LAB — MDC_IDC_ENUM_SESS_TYPE_REMOTE
Battery Impedance: 112 Ohm
Battery Voltage: 2.8 V
Brady Statistic AP VP Percent: 0 %
Brady Statistic AP VS Percent: 98 %
Brady Statistic AS VP Percent: 0 %
Brady Statistic AS VS Percent: 2 %
Date Time Interrogation Session: 20150127160314
Lead Channel Impedance Value: 428 Ohm
Lead Channel Impedance Value: 530 Ohm
Lead Channel Pacing Threshold Amplitude: 0.5 V
Lead Channel Pacing Threshold Pulse Width: 0.4 ms
Lead Channel Setting Pacing Amplitude: 2 V
Lead Channel Setting Pacing Pulse Width: 0.4 ms
MDC IDC MSMT BATTERY REMAINING LONGEVITY: 158 mo
MDC IDC MSMT LEADCHNL RV PACING THRESHOLD AMPLITUDE: 0.875 V
MDC IDC MSMT LEADCHNL RV PACING THRESHOLD PULSEWIDTH: 0.4 ms
MDC IDC MSMT LEADCHNL RV SENSING INTR AMPL: 5.6 mV
MDC IDC SET LEADCHNL RA PACING AMPLITUDE: 1.5 V
MDC IDC SET LEADCHNL RV SENSING SENSITIVITY: 2.8 mV

## 2013-02-25 ENCOUNTER — Encounter: Payer: Self-pay | Admitting: *Deleted

## 2013-03-08 ENCOUNTER — Emergency Department (HOSPITAL_COMMUNITY): Payer: Medicare Other

## 2013-03-08 ENCOUNTER — Other Ambulatory Visit: Payer: Self-pay

## 2013-03-08 ENCOUNTER — Emergency Department (HOSPITAL_COMMUNITY)
Admission: EM | Admit: 2013-03-08 | Discharge: 2013-03-08 | Disposition: A | Discharge status: Home / Self Care | Payer: Medicare Other | Attending: Emergency Medicine | Admitting: Emergency Medicine

## 2013-03-08 ENCOUNTER — Encounter (HOSPITAL_COMMUNITY): Payer: Self-pay | Admitting: Emergency Medicine

## 2013-03-08 DIAGNOSIS — I499 Cardiac arrhythmia, unspecified: Secondary | ICD-10-CM | POA: Diagnosis not present

## 2013-03-08 DIAGNOSIS — Z950 Presence of cardiac pacemaker: Secondary | ICD-10-CM | POA: Diagnosis not present

## 2013-03-08 DIAGNOSIS — I4891 Unspecified atrial fibrillation: Secondary | ICD-10-CM | POA: Diagnosis not present

## 2013-03-08 DIAGNOSIS — M810 Age-related osteoporosis without current pathological fracture: Secondary | ICD-10-CM | POA: Diagnosis not present

## 2013-03-08 DIAGNOSIS — I10 Essential (primary) hypertension: Secondary | ICD-10-CM | POA: Insufficient documentation

## 2013-03-08 DIAGNOSIS — Z79899 Other long term (current) drug therapy: Secondary | ICD-10-CM | POA: Diagnosis not present

## 2013-03-08 DIAGNOSIS — E119 Type 2 diabetes mellitus without complications: Secondary | ICD-10-CM | POA: Insufficient documentation

## 2013-03-08 DIAGNOSIS — Z87442 Personal history of urinary calculi: Secondary | ICD-10-CM | POA: Insufficient documentation

## 2013-03-08 DIAGNOSIS — R5383 Other fatigue: Secondary | ICD-10-CM | POA: Diagnosis not present

## 2013-03-08 DIAGNOSIS — F329 Major depressive disorder, single episode, unspecified: Secondary | ICD-10-CM | POA: Insufficient documentation

## 2013-03-08 DIAGNOSIS — Z7901 Long term (current) use of anticoagulants: Secondary | ICD-10-CM | POA: Diagnosis not present

## 2013-03-08 DIAGNOSIS — F3289 Other specified depressive episodes: Secondary | ICD-10-CM | POA: Insufficient documentation

## 2013-03-08 DIAGNOSIS — R6889 Other general symptoms and signs: Secondary | ICD-10-CM | POA: Diagnosis not present

## 2013-03-08 DIAGNOSIS — R5381 Other malaise: Secondary | ICD-10-CM | POA: Diagnosis not present

## 2013-03-08 DIAGNOSIS — M129 Arthropathy, unspecified: Secondary | ICD-10-CM | POA: Insufficient documentation

## 2013-03-08 DIAGNOSIS — F410 Panic disorder [episodic paroxysmal anxiety] without agoraphobia: Secondary | ICD-10-CM | POA: Insufficient documentation

## 2013-03-08 DIAGNOSIS — R002 Palpitations: Secondary | ICD-10-CM | POA: Insufficient documentation

## 2013-03-08 DIAGNOSIS — I509 Heart failure, unspecified: Secondary | ICD-10-CM | POA: Diagnosis not present

## 2013-03-08 DIAGNOSIS — Z85828 Personal history of other malignant neoplasm of skin: Secondary | ICD-10-CM | POA: Diagnosis not present

## 2013-03-08 LAB — CBC WITH DIFFERENTIAL/PLATELET
BASOS ABS: 0 10*3/uL (ref 0.0–0.1)
Basophils Relative: 0 % (ref 0–1)
EOS PCT: 1 % (ref 0–5)
Eosinophils Absolute: 0.1 10*3/uL (ref 0.0–0.7)
HCT: 38.3 % (ref 36.0–46.0)
Hemoglobin: 13.4 g/dL (ref 12.0–15.0)
Lymphocytes Relative: 24 % (ref 12–46)
Lymphs Abs: 2 10*3/uL (ref 0.7–4.0)
MCH: 32.4 pg (ref 26.0–34.0)
MCHC: 35 g/dL (ref 30.0–36.0)
MCV: 92.5 fL (ref 78.0–100.0)
MONO ABS: 0.3 10*3/uL (ref 0.1–1.0)
MONOS PCT: 4 % (ref 3–12)
Neutro Abs: 6 10*3/uL (ref 1.7–7.7)
Neutrophils Relative %: 71 % (ref 43–77)
Platelets: 204 10*3/uL (ref 150–400)
RBC: 4.14 MIL/uL (ref 3.87–5.11)
RDW: 12.9 % (ref 11.5–15.5)
WBC: 8.5 10*3/uL (ref 4.0–10.5)

## 2013-03-08 LAB — BASIC METABOLIC PANEL
BUN: 21 mg/dL (ref 6–23)
CO2: 28 meq/L (ref 19–32)
CREATININE: 0.65 mg/dL (ref 0.50–1.10)
Calcium: 9.4 mg/dL (ref 8.4–10.5)
Chloride: 106 mEq/L (ref 96–112)
GFR calc Af Amer: 86 mL/min — ABNORMAL LOW (ref >90)
GFR calc non Af Amer: 74 mL/min — ABNORMAL LOW (ref >90)
Glucose, Bld: 142 mg/dL — ABNORMAL HIGH (ref 70–99)
Potassium: 3.8 mEq/L (ref 3.7–5.3)
Sodium: 144 mEq/L (ref 137–147)

## 2013-03-08 LAB — POCT I-STAT TROPONIN I: Troponin i, poc: 0.01 ng/mL (ref 0.00–0.08)

## 2013-03-08 LAB — PRO B NATRIURETIC PEPTIDE: Pro B Natriuretic peptide (BNP): 253.4 pg/mL (ref 0–450)

## 2013-03-08 NOTE — Discharge Instructions (Signed)
Continue taking all prescribed medication as directed. Follow-up with Dr. Martinique next week for re-check. Return to the ED for new or worsening symptoms-- chest pain, shortness of breath, swelling of legs, diaphoresis, nausea, vomiting, etc.

## 2013-03-08 NOTE — ED Provider Notes (Signed)
CSN: [id number]     Arrival date & time 03/08/13  1054 History   First MD Initiated Contact with Patient 03/08/13 1117     Chief Complaint  Patient presents with  . Palpitations   (Consider location/radiation/quality/duration/timing/severity/associated sxs/prior Treatment) Patient is a 80 y.o. female presenting with palpitations. The history is provided by the patient and medical records.  Palpitations  This is a 80 year old female with a past medical history significant for hypertension, diabetes, congestive heart failure, SSS, paroxysmal AFIB with medtronic pacemaker in place, presenting to the ED for palpitations starting at 0400 which awoke pt from sleep.  Pt denies associated chest pain, SOB, diaphoresis, nausea, vomiting, dizziness, or feelings of syncope.  The patient denies any excessive caffeine intake or stress.  Patient is followed by Dr. Peter Martinique, has been doing well up until this point.  States she has been compliant with all her medications, no recent changes.  No noted LE edema or weight gain.  No recent illness, cough, fevers, sweats, or chills.  Past Medical History  Diagnosis Date  . History of aortic insufficiency   . Anxiety   . Hypertension   . Pacemaker   . PAF (paroxysmal atrial fibrillation)     a. s/p pacemaker. b. s/p Medtronic gen change 07/2011.  . SSS (sick sinus syndrome)   . Type II diabetes mellitus     "controlled w/diet"  . Skin cancer ?1960's    "between breasts"  . Arthritis     "hands; between shoulders; back; feet"  . Panic attacks     "since losing husband 1994"  . Depression   . High risk medication use     on Flecainide  . Chronic anticoagulation   . Osteoporosis   . CHF (congestive heart failure)   . Kidney stone     pased in [place] 2014   Past Surgical History  Procedure Laterality Date  . Cesarean section  1955  . Cardiovascular stress test  12/09/2004  . Cataract extraction w/ intraocular lens  implant, bilateral  1990's  .  Tonsillectomy      "school age"  . Appendectomy  1955  . Dilation and curettage of uterus  1953  . Breast cyst excision  ~ 1950    right  . Insert / replace / remove pacemaker  03/16/2006  . Skin cancer excision  ?1960's    "between my breasts"   Family History  Problem Relation Age of Onset  . Emphysema Father    History  Substance Use Topics  . Smoking status: Never Smoker   . Smokeless tobacco: Never Used  . Alcohol Use: No   OB History   Grav Para Term Preterm Abortions TAB SAB Ect Mult Living                 Review of Systems  Cardiovascular: Positive for palpitations.  All other systems reviewed and are negative.    Allergies  Sulfonamide derivatives and Citalopram hydrobromide  Home Medications   Current Outpatient Rx  Name  Route  Sig  Dispense  Refill  . apixaban (ELIQUIS) 2.5 MG TABS tablet   Oral   Take 2.5 mg by mouth 2 (two) times daily.         . Calcium Carbonate (CALCIUM 600 PO)   Oral   Take 600 mg by mouth daily.          . carvedilol (COREG) 3.125 MG tablet   Oral   Take 3.125 mg by mouth 2 (  two) times daily with a meal.         . diltiazem (CARDIZEM CD) 240 MG 24 hr capsule   Oral   Take 1 capsule (240 mg total) by mouth daily.         . flecainide (TAMBOCOR) 50 MG tablet   Oral   Take 50 mg by mouth 2 (two) times daily.         Marland Kitchen LORazepam (ATIVAN) 0.5 MG tablet   Oral   Take 0.25-0.5 mg by mouth See admin instructions. Patient takes 0.5 tablet (0.25 mg) as needed for anxiety during the day. Patient takes one tablet (0.5 mg) every night.         . sertraline (ZOLOFT) 50 MG tablet   Oral   Take 50 mg by mouth every morning.          . Vitamin D, Ergocalciferol, (DRISDOL) 50000 UNITS CAPS capsule   Oral   Take 50,000 Units by mouth every Wednesday.         . estradiol (ESTRACE) 0.1 MG/GM vaginal cream   Vaginal   Place 2 g vaginally 3 (three) times a week. Monday, Wednesday, friday         . rosuvastatin  (CRESTOR) 5 MG tablet   Oral   Take 2.5 mg by mouth every Monday, Wednesday, and Friday.           BP 175/72  Pulse 66  Temp(Src) 97.6 F (36.4 C) (Oral)  Resp 18  SpO2 92%  Physical Exam  Nursing note and vitals reviewed. Constitutional: She is oriented to person, place, and time. She appears well-developed and well-nourished. No distress.  HENT:  Head: Normocephalic and atraumatic.  Mouth/Throat: Oropharynx is clear and moist.  Eyes: Conjunctivae and EOM are normal. Pupils are equal, round, and reactive to light.  Neck: Normal range of motion. Neck supple.  Cardiovascular: Normal rate, regular rhythm and normal heart sounds.   Pulmonary/Chest: Effort normal and breath sounds normal. No respiratory distress. She has no wheezes.  Abdominal: Soft. Bowel sounds are normal. There is no tenderness. There is no guarding.  Musculoskeletal: Normal range of motion. She exhibits no edema.  Neurological: She is alert and oriented to person, place, and time.  Skin: Skin is warm and dry. She is not diaphoretic.  Psychiatric: She has a normal mood and affect.    ED Course  Procedures (including critical care time)   Date: 03/08/2013  Rate: 66  Rhythm: normal sinus rhythm  QRS Axis: normal  Intervals: normal  ST/T Wave abnormalities: normal  Conduction Disutrbances:none  Narrative Interpretation:   Old EKG Reviewed: unchanged   Labs Review Labs Reviewed  BASIC METABOLIC PANEL - Abnormal; Notable for the following:    Glucose, Bld 142 (*)    GFR calc non Af Amer 74 (*)    GFR calc Af Amer 86 (*)    All other components within normal limits  CBC WITH DIFFERENTIAL  PRO B NATRIURETIC PEPTIDE  POCT I-STAT TROPONIN I   Imaging Review Dg Chest 2 View  03/08/2013   CLINICAL DATA:  Heart palpitations, chest pain  EXAM: CHEST  2 VIEW  COMPARISON:  07/09/2012  FINDINGS: Cardiac shadow is stable. A pacing device is again seen. The lungs are well aerated bilaterally. No focal  infiltrate is seen. Degenerative changes of the thoracic spine are noted.  IMPRESSION: No active cardiopulmonary disease.   Electronically Signed   By: Inez Catalina M.D.   On: 03/08/2013 12:29  EKG Interpretation   None       MDM   Final diagnoses:  Palpitations   EKG NSR, no acute ischemic changes.  Trop negative.  CXR clear.  Labs reassuring.  Pacemaker was interrogated without any significant findings.  Pt has remained in NSR while in the ED without episodes of chest pain, SOB, diaphoresis, nausea, vomiting, or weakness.  At this time i have low suspicion for ACS, PE, dissection, or other acute cardiac event.  Case was discussed with Dr. Charlett Blake-- does not recommend further testing or work-up today and she [place] FU in office with Dr. Martinique next week.  Signs/sx that would warrant ED return were discussed with pt and family-- they acknowledged understanding and agreed with plan of care.  VS stable at time of discharge.  Larene Pickett, PA-C 03/08/13 [phone]

## 2013-03-08 NOTE — ED Notes (Signed)
Pt here with c/o palpitations that started at 4 am today. Pt called 911, [facility] transported here after 12 lead and IV started.  Pt awake and alert at present.

## 2013-03-09 NOTE — ED Provider Notes (Signed)
Medical screening examination/treatment/procedure(s) were performed by non-physician practitioner and as supervising physician I was immediately available for consultation/collaboration.  EKG Interpretation   None         Alfonzo Feller, DO 03/09/13 1350

## 2013-03-11 ENCOUNTER — Encounter: Payer: Self-pay | Admitting: Internal Medicine

## 2013-03-25 DIAGNOSIS — I10 Essential (primary) hypertension: Secondary | ICD-10-CM | POA: Diagnosis not present

## 2013-03-25 DIAGNOSIS — E785 Hyperlipidemia, unspecified: Secondary | ICD-10-CM | POA: Diagnosis not present

## 2013-03-25 DIAGNOSIS — E119 Type 2 diabetes mellitus without complications: Secondary | ICD-10-CM | POA: Diagnosis not present

## 2013-03-25 DIAGNOSIS — E559 Vitamin D deficiency, unspecified: Secondary | ICD-10-CM | POA: Diagnosis not present

## 2013-03-26 ENCOUNTER — Other Ambulatory Visit: Payer: Self-pay | Admitting: Cardiology

## 2013-03-28 DIAGNOSIS — R002 Palpitations: Secondary | ICD-10-CM | POA: Diagnosis not present

## 2013-03-28 DIAGNOSIS — I10 Essential (primary) hypertension: Secondary | ICD-10-CM | POA: Diagnosis not present

## 2013-03-28 DIAGNOSIS — M810 Age-related osteoporosis without current pathological fracture: Secondary | ICD-10-CM | POA: Diagnosis not present

## 2013-03-28 DIAGNOSIS — Z23 Encounter for immunization: Secondary | ICD-10-CM | POA: Diagnosis not present

## 2013-03-28 DIAGNOSIS — F410 Panic disorder [episodic paroxysmal anxiety] without agoraphobia: Secondary | ICD-10-CM | POA: Diagnosis not present

## 2013-03-28 DIAGNOSIS — L89309 Pressure ulcer of unspecified buttock, unspecified stage: Secondary | ICD-10-CM | POA: Diagnosis not present

## 2013-03-28 DIAGNOSIS — I4891 Unspecified atrial fibrillation: Secondary | ICD-10-CM | POA: Diagnosis not present

## 2013-03-28 DIAGNOSIS — E785 Hyperlipidemia, unspecified: Secondary | ICD-10-CM | POA: Diagnosis not present

## 2013-03-28 DIAGNOSIS — Z0000 Encounter for general adult medical examination without abnormal findings: Secondary | ICD-10-CM | POA: Diagnosis not present

## 2013-03-29 DIAGNOSIS — L89309 Pressure ulcer of unspecified buttock, unspecified stage: Secondary | ICD-10-CM | POA: Diagnosis not present

## 2013-03-29 DIAGNOSIS — F341 Dysthymic disorder: Secondary | ICD-10-CM | POA: Diagnosis not present

## 2013-03-29 DIAGNOSIS — I10 Essential (primary) hypertension: Secondary | ICD-10-CM | POA: Diagnosis not present

## 2013-03-29 DIAGNOSIS — Z1212 Encounter for screening for malignant neoplasm of rectum: Secondary | ICD-10-CM | POA: Diagnosis not present

## 2013-03-29 DIAGNOSIS — L8992 Pressure ulcer of unspecified site, stage 2: Secondary | ICD-10-CM | POA: Diagnosis not present

## 2013-03-29 DIAGNOSIS — I4891 Unspecified atrial fibrillation: Secondary | ICD-10-CM | POA: Diagnosis not present

## 2013-04-03 DIAGNOSIS — L89309 Pressure ulcer of unspecified buttock, unspecified stage: Secondary | ICD-10-CM | POA: Diagnosis not present

## 2013-04-03 DIAGNOSIS — F341 Dysthymic disorder: Secondary | ICD-10-CM | POA: Diagnosis not present

## 2013-04-03 DIAGNOSIS — I4891 Unspecified atrial fibrillation: Secondary | ICD-10-CM | POA: Diagnosis not present

## 2013-04-03 DIAGNOSIS — I10 Essential (primary) hypertension: Secondary | ICD-10-CM | POA: Diagnosis not present

## 2013-04-03 DIAGNOSIS — L8992 Pressure ulcer of unspecified site, stage 2: Secondary | ICD-10-CM | POA: Diagnosis not present

## 2013-04-05 DIAGNOSIS — L8992 Pressure ulcer of unspecified site, stage 2: Secondary | ICD-10-CM | POA: Diagnosis not present

## 2013-04-05 DIAGNOSIS — L89309 Pressure ulcer of unspecified buttock, unspecified stage: Secondary | ICD-10-CM | POA: Diagnosis not present

## 2013-04-05 DIAGNOSIS — I4891 Unspecified atrial fibrillation: Secondary | ICD-10-CM | POA: Diagnosis not present

## 2013-04-05 DIAGNOSIS — F341 Dysthymic disorder: Secondary | ICD-10-CM | POA: Diagnosis not present

## 2013-04-05 DIAGNOSIS — I10 Essential (primary) hypertension: Secondary | ICD-10-CM | POA: Diagnosis not present

## 2013-04-07 ENCOUNTER — Telehealth: Payer: Self-pay | Admitting: Physician Assistant

## 2013-04-07 NOTE — Telephone Encounter (Signed)
[name] called the answering service c/o chest tenderness to area overlying her pacemaker site. The discomfort occurred yesterday evening and has been intermittent. No substernal or exertional chest pressure. "Just a twinge here and there." Denies redness, swelling, discharge, fevers or chills. No palpitations, lightheadedness or syncope. Pacemaker interrogation in February WNL. Advised to try OTC Tylenol or NSAIDs PRN. Continue to monitor. If worsens, advised to present to the nearest ED or call back for follow-up in the wound care/device clinic. She understood and agreed.    Jacquelynn Cree, PA-C 04/07/2013 12:18 PM

## 2013-04-08 ENCOUNTER — Telehealth: Payer: Self-pay | Admitting: Cardiology

## 2013-04-08 DIAGNOSIS — L89309 Pressure ulcer of unspecified buttock, unspecified stage: Secondary | ICD-10-CM | POA: Diagnosis not present

## 2013-04-08 DIAGNOSIS — I4891 Unspecified atrial fibrillation: Secondary | ICD-10-CM | POA: Diagnosis not present

## 2013-04-08 DIAGNOSIS — F341 Dysthymic disorder: Secondary | ICD-10-CM | POA: Diagnosis not present

## 2013-04-08 DIAGNOSIS — I10 Essential (primary) hypertension: Secondary | ICD-10-CM | POA: Diagnosis not present

## 2013-04-08 DIAGNOSIS — L8992 Pressure ulcer of unspecified site, stage 2: Secondary | ICD-10-CM | POA: Diagnosis not present

## 2013-04-08 NOTE — Telephone Encounter (Signed)
I spoke with [name] in [place] clinic who advised that "needle-stick" or "bee-sting" pains in the area of the pacemaker generally come from scar tissue in the pocket.  He advised that when patient feels these pains to change positions.  I called patient and discussed Jo [name]'s advice.  Patient states her pains do feel like "needle sticks" and that she is usually laying down when the pains occur.  Patient states she has not had any pain since last night/early this morning when she was in the bed.  I advised patient that if and when she feels these pains to change positions and to note whether or not the pain is resolved.  I advised patient that she should call back if pain continues or worsens.  Patient verbalized understanding and agreement.

## 2013-04-08 NOTE — Telephone Encounter (Signed)
New problem   Pt is having pain in her chest that goes and comes. Pt has a pacer. Please call pt concerning this matter.

## 2013-04-08 NOTE — Telephone Encounter (Signed)
Spoke with patient who states she has been having pain around her heart that started late Saturday night and occurs intermittently.  Patient states she has not had any pain in center of chest and denies SOB, nausea, diaphoresis, swelling or any additional symptoms.  Patient states she has been doing various activities when pain occurs.  Patient states when pain first occurred she was sitting up on her bed.  Patient states she has not had pain today.  Patient called the on-call provider yesterday and Danella Sensing, PA-C advised her to take Tylenol for pain, which patient states has helped.  Patient would like to see Dr. Martinique.  I advised patient that Dr. Martinique is out of the office until later this week and that his schedule is full.  There are no PA/NP appointments until Thursday.  I am going to discuss with device clinic staff to determine if pacer check is needed and will call patient back to discuss.

## 2013-04-15 DIAGNOSIS — F341 Dysthymic disorder: Secondary | ICD-10-CM | POA: Diagnosis not present

## 2013-04-15 DIAGNOSIS — L89309 Pressure ulcer of unspecified buttock, unspecified stage: Secondary | ICD-10-CM | POA: Diagnosis not present

## 2013-04-15 DIAGNOSIS — L8992 Pressure ulcer of unspecified site, stage 2: Secondary | ICD-10-CM | POA: Diagnosis not present

## 2013-04-15 DIAGNOSIS — I10 Essential (primary) hypertension: Secondary | ICD-10-CM | POA: Diagnosis not present

## 2013-04-15 DIAGNOSIS — I4891 Unspecified atrial fibrillation: Secondary | ICD-10-CM | POA: Diagnosis not present

## 2013-04-20 DIAGNOSIS — L8992 Pressure ulcer of unspecified site, stage 2: Secondary | ICD-10-CM | POA: Diagnosis not present

## 2013-04-20 DIAGNOSIS — I4891 Unspecified atrial fibrillation: Secondary | ICD-10-CM | POA: Diagnosis not present

## 2013-04-20 DIAGNOSIS — I10 Essential (primary) hypertension: Secondary | ICD-10-CM | POA: Diagnosis not present

## 2013-04-20 DIAGNOSIS — L89309 Pressure ulcer of unspecified buttock, unspecified stage: Secondary | ICD-10-CM | POA: Diagnosis not present

## 2013-04-20 DIAGNOSIS — F341 Dysthymic disorder: Secondary | ICD-10-CM | POA: Diagnosis not present

## 2013-04-24 DIAGNOSIS — L8992 Pressure ulcer of unspecified site, stage 2: Secondary | ICD-10-CM | POA: Diagnosis not present

## 2013-04-24 DIAGNOSIS — I4891 Unspecified atrial fibrillation: Secondary | ICD-10-CM | POA: Diagnosis not present

## 2013-04-24 DIAGNOSIS — L89309 Pressure ulcer of unspecified buttock, unspecified stage: Secondary | ICD-10-CM | POA: Diagnosis not present

## 2013-04-24 DIAGNOSIS — F341 Dysthymic disorder: Secondary | ICD-10-CM | POA: Diagnosis not present

## 2013-04-24 DIAGNOSIS — I10 Essential (primary) hypertension: Secondary | ICD-10-CM | POA: Diagnosis not present

## 2013-04-27 DIAGNOSIS — L8992 Pressure ulcer of unspecified site, stage 2: Secondary | ICD-10-CM | POA: Diagnosis not present

## 2013-04-27 DIAGNOSIS — I10 Essential (primary) hypertension: Secondary | ICD-10-CM | POA: Diagnosis not present

## 2013-04-27 DIAGNOSIS — F341 Dysthymic disorder: Secondary | ICD-10-CM | POA: Diagnosis not present

## 2013-04-27 DIAGNOSIS — I4891 Unspecified atrial fibrillation: Secondary | ICD-10-CM | POA: Diagnosis not present

## 2013-04-27 DIAGNOSIS — L89309 Pressure ulcer of unspecified buttock, unspecified stage: Secondary | ICD-10-CM | POA: Diagnosis not present

## 2013-05-01 DIAGNOSIS — F341 Dysthymic disorder: Secondary | ICD-10-CM | POA: Diagnosis not present

## 2013-05-01 DIAGNOSIS — I4891 Unspecified atrial fibrillation: Secondary | ICD-10-CM | POA: Diagnosis not present

## 2013-05-01 DIAGNOSIS — L89309 Pressure ulcer of unspecified buttock, unspecified stage: Secondary | ICD-10-CM | POA: Diagnosis not present

## 2013-05-01 DIAGNOSIS — I10 Essential (primary) hypertension: Secondary | ICD-10-CM | POA: Diagnosis not present

## 2013-05-01 DIAGNOSIS — L8992 Pressure ulcer of unspecified site, stage 2: Secondary | ICD-10-CM | POA: Diagnosis not present

## 2013-05-03 DIAGNOSIS — L89309 Pressure ulcer of unspecified buttock, unspecified stage: Secondary | ICD-10-CM | POA: Diagnosis not present

## 2013-05-03 DIAGNOSIS — F341 Dysthymic disorder: Secondary | ICD-10-CM | POA: Diagnosis not present

## 2013-05-03 DIAGNOSIS — I10 Essential (primary) hypertension: Secondary | ICD-10-CM | POA: Diagnosis not present

## 2013-05-03 DIAGNOSIS — L8992 Pressure ulcer of unspecified site, stage 2: Secondary | ICD-10-CM | POA: Diagnosis not present

## 2013-05-03 DIAGNOSIS — I4891 Unspecified atrial fibrillation: Secondary | ICD-10-CM | POA: Diagnosis not present

## 2013-05-07 DIAGNOSIS — F341 Dysthymic disorder: Secondary | ICD-10-CM | POA: Diagnosis not present

## 2013-05-07 DIAGNOSIS — L89309 Pressure ulcer of unspecified buttock, unspecified stage: Secondary | ICD-10-CM | POA: Diagnosis not present

## 2013-05-07 DIAGNOSIS — L8992 Pressure ulcer of unspecified site, stage 2: Secondary | ICD-10-CM | POA: Diagnosis not present

## 2013-05-07 DIAGNOSIS — I4891 Unspecified atrial fibrillation: Secondary | ICD-10-CM | POA: Diagnosis not present

## 2013-05-07 DIAGNOSIS — I10 Essential (primary) hypertension: Secondary | ICD-10-CM | POA: Diagnosis not present

## 2013-05-15 DIAGNOSIS — F341 Dysthymic disorder: Secondary | ICD-10-CM | POA: Diagnosis not present

## 2013-05-15 DIAGNOSIS — I4891 Unspecified atrial fibrillation: Secondary | ICD-10-CM | POA: Diagnosis not present

## 2013-05-15 DIAGNOSIS — L89309 Pressure ulcer of unspecified buttock, unspecified stage: Secondary | ICD-10-CM | POA: Diagnosis not present

## 2013-05-15 DIAGNOSIS — I10 Essential (primary) hypertension: Secondary | ICD-10-CM | POA: Diagnosis not present

## 2013-05-15 DIAGNOSIS — L8992 Pressure ulcer of unspecified site, stage 2: Secondary | ICD-10-CM | POA: Diagnosis not present

## 2013-05-17 DIAGNOSIS — I4891 Unspecified atrial fibrillation: Secondary | ICD-10-CM | POA: Diagnosis not present

## 2013-05-17 DIAGNOSIS — L8992 Pressure ulcer of unspecified site, stage 2: Secondary | ICD-10-CM | POA: Diagnosis not present

## 2013-05-17 DIAGNOSIS — F341 Dysthymic disorder: Secondary | ICD-10-CM | POA: Diagnosis not present

## 2013-05-17 DIAGNOSIS — I10 Essential (primary) hypertension: Secondary | ICD-10-CM | POA: Diagnosis not present

## 2013-05-17 DIAGNOSIS — L89309 Pressure ulcer of unspecified buttock, unspecified stage: Secondary | ICD-10-CM | POA: Diagnosis not present

## 2013-05-21 ENCOUNTER — Other Ambulatory Visit: Payer: Self-pay | Admitting: Cardiology

## 2013-05-23 ENCOUNTER — Ambulatory Visit (INDEPENDENT_AMBULATORY_CARE_PROVIDER_SITE_OTHER): Payer: Medicare Other | Admitting: *Deleted

## 2013-05-23 DIAGNOSIS — I495 Sick sinus syndrome: Secondary | ICD-10-CM | POA: Diagnosis not present

## 2013-05-29 LAB — MDC_IDC_ENUM_SESS_TYPE_REMOTE
Battery Remaining Longevity: 158 mo
Battery Voltage: 2.8 V
Brady Statistic AP VP Percent: 0 %
Brady Statistic AS VP Percent: 0 %
Brady Statistic AS VS Percent: 2 %
Lead Channel Impedance Value: 522 Ohm
Lead Channel Pacing Threshold Amplitude: 0.5 V
Lead Channel Setting Pacing Amplitude: 1.5 V
Lead Channel Setting Pacing Pulse Width: 0.4 ms
MDC IDC MSMT BATTERY IMPEDANCE: 112 Ohm
MDC IDC MSMT LEADCHNL RA PACING THRESHOLD PULSEWIDTH: 0.4 ms
MDC IDC MSMT LEADCHNL RV IMPEDANCE VALUE: 428 Ohm
MDC IDC MSMT LEADCHNL RV PACING THRESHOLD AMPLITUDE: 0.875 V
MDC IDC MSMT LEADCHNL RV PACING THRESHOLD PULSEWIDTH: 0.4 ms
MDC IDC MSMT LEADCHNL RV SENSING INTR AMPL: 16 mV
MDC IDC SESS DTM: 20150430175841
MDC IDC SET LEADCHNL RV PACING AMPLITUDE: 2 V
MDC IDC SET LEADCHNL RV SENSING SENSITIVITY: 2.8 mV
MDC IDC STAT BRADY AP VS PERCENT: 98 %

## 2013-05-30 DIAGNOSIS — D485 Neoplasm of uncertain behavior of skin: Secondary | ICD-10-CM | POA: Diagnosis not present

## 2013-05-30 DIAGNOSIS — D1801 Hemangioma of skin and subcutaneous tissue: Secondary | ICD-10-CM | POA: Diagnosis not present

## 2013-05-30 DIAGNOSIS — D239 Other benign neoplasm of skin, unspecified: Secondary | ICD-10-CM | POA: Diagnosis not present

## 2013-06-05 ENCOUNTER — Telehealth: Payer: Self-pay | Admitting: Cardiology

## 2013-06-05 NOTE — Telephone Encounter (Signed)
New message          Pt would like for cheryl to call her back

## 2013-06-05 NOTE — Telephone Encounter (Signed)
Returned call to patient she stated since last Sunday 05/26/13 she has been having weak spells when she gets up in mornings.Stated weak spells last all morning.Stated she starts feeling better after lunch.No chest pain.No sob.Stated she has noticed when she is having a weak spell her heart beats hard.Dr.Jordan out of office this afternoon,will check with him tomorrow 06/06/13 and call her back.

## 2013-06-10 NOTE — Telephone Encounter (Signed)
Returned call to patient 06/07/13 she stated she was feeling better.Spoke to [place] he advised needs to see PCP if weak spells continue.

## 2013-06-13 ENCOUNTER — Encounter: Payer: Self-pay | Admitting: Cardiology

## 2013-06-22 ENCOUNTER — Other Ambulatory Visit: Payer: Self-pay | Admitting: Cardiology

## 2013-06-27 ENCOUNTER — Ambulatory Visit (INDEPENDENT_AMBULATORY_CARE_PROVIDER_SITE_OTHER): Payer: Medicare Other | Admitting: Cardiology

## 2013-06-27 ENCOUNTER — Encounter: Payer: Self-pay | Admitting: Cardiology

## 2013-06-27 VITALS — BP 116/60 | HR 84 | Ht 62.0 in | Wt 131.0 lb

## 2013-06-27 DIAGNOSIS — I495 Sick sinus syndrome: Secondary | ICD-10-CM | POA: Diagnosis not present

## 2013-06-27 DIAGNOSIS — I480 Paroxysmal atrial fibrillation: Secondary | ICD-10-CM

## 2013-06-27 DIAGNOSIS — I4891 Unspecified atrial fibrillation: Secondary | ICD-10-CM

## 2013-06-27 DIAGNOSIS — I10 Essential (primary) hypertension: Secondary | ICD-10-CM | POA: Diagnosis not present

## 2013-06-27 DIAGNOSIS — E119 Type 2 diabetes mellitus without complications: Secondary | ICD-10-CM

## 2013-06-27 NOTE — Patient Instructions (Signed)
Continue your current therapy  I will see you in 6 months.   

## 2013-06-27 NOTE — Progress Notes (Signed)
Jo [name] Date of Birth: 01/03/1918 Medical Record #[id number]  History of Present Illness: Jo [name] is seen back today for a follow up visit. She has a history of AI, depression, DM, HTN and PAF with a pacemaker in place. Remains on Flecainide and Eliquis. Last generator change back in 2013. She has done very well since last seen in Nov. Denies any chest pain or SOB. Occasionally she gets a surge in her BP and feels her heart pounding. She takes an Ativan and rests and this resolves. Overall she feels she is doing very well for 89.   Current Outpatient Prescriptions  Medication Sig Dispense Refill  . Calcium Carbonate (CALCIUM 600 PO) Take 600 mg by mouth daily.       . carvedilol (COREG) 3.125 MG tablet Take 3.125 mg by mouth 2 (two) times daily with a meal.      . diltiazem (CARDIZEM CD) 240 MG 24 hr capsule Take 1 capsule (240 mg total) by mouth daily.      Marland Kitchen ELIQUIS 2.5 MG TABS tablet TAKE ONE TABLET BY MOUTH TWICE DAILY  60 tablet  0  . estradiol (ESTRACE) 0.1 MG/GM vaginal cream Place 2 g vaginally 3 (three) times a week. Monday, Wednesday, friday      . flecainide (TAMBOCOR) 50 MG tablet Take 50 mg by mouth 2 (two) times daily.      Marland Kitchen LORazepam (ATIVAN) 0.5 MG tablet Take 0.25-0.5 mg by mouth See admin instructions. Patient takes 0.5 tablet (0.25 mg) as needed for anxiety during the day. Patient takes one tablet (0.5 mg) every night.      . rosuvastatin (CRESTOR) 5 MG tablet Take 2.5 mg by mouth every Monday, Wednesday, and Friday.       . sertraline (ZOLOFT) 50 MG tablet Take 50 mg by mouth every morning.       . Vitamin D, Ergocalciferol, (DRISDOL) 50000 UNITS CAPS capsule Take 50,000 Units by mouth every Wednesday.       No current facility-administered medications for this visit.    Allergies  Allergen Reactions  . Sulfonamide Derivatives Rash    "it was all over my legs"  . Citalopram Hydrobromide     Pt does not remember reaction    Past Medical History  Diagnosis Date   . History of aortic insufficiency   . Anxiety   . Hypertension   . Pacemaker   . PAF (paroxysmal atrial fibrillation)     a. s/p pacemaker. b. s/p Medtronic gen change 07/2011.  . SSS (sick sinus syndrome)   . Type II diabetes mellitus     "controlled w/diet"  . Skin cancer ?1960's    "between breasts"  . Arthritis     "hands; between shoulders; back; feet"  . Panic attacks     "since losing husband 1994"  . Depression   . High risk medication use     on Flecainide  . Chronic anticoagulation   . Osteoporosis   . CHF (congestive heart failure)   . Kidney stone     pased in [place] 2014    Past Surgical History  Procedure Laterality Date  . Cesarean section  1955  . Cardiovascular stress test  12/09/2004  . Cataract extraction w/ intraocular lens  implant, bilateral  1990's  . Tonsillectomy      "school age"  . Appendectomy  1955  . Dilation and curettage of uterus  1953  . Breast cyst excision  ~ 1950    right  .  Insert / replace / remove pacemaker  03/16/2006  . Skin cancer excision  ?1960's    "between my breasts"    History  Smoking status  . Never Smoker   Smokeless tobacco  . Never Used    History  Alcohol Use No    Family History  Problem Relation Age of Onset  . Emphysema Father     Review of Systems: The review of systems is per the HPI.  All other systems were reviewed and are negative.  Physical Exam: BP 116/60  Pulse 84  Ht 5\' 2"  (1.575 m)  Wt 131 lb (59.421 kg)  BMI 23.95 kg/m2 Patient is very pleasant and in no acute distress. She is hard of hearing. Skin is warm and dry. Color is normal.  HEENT is unremarkable. Normocephalic/atraumatic. PERRL. Sclera are nonicteric. Neck is supple. No masses. No JVD. Lungs are clear. Cardiac exam shows a regular rate and rhythm. Abdomen is soft. Extremities are without edema. Gait and ROM are intact. No gross neurologic deficits noted.  LABORATORY DATA:  Lab Results  Component Value Date   WBC 8.5  03/08/2013   HGB 13.4 03/08/2013   HCT 38.3 03/08/2013   PLT 204 03/08/2013   GLUCOSE 142* 03/08/2013   ALT 16 07/10/2012   AST 8 07/10/2012   NA 144 03/08/2013   K 3.8 03/08/2013   CL 106 03/08/2013   CREATININE 0.65 03/08/2013   BUN 21 03/08/2013   CO2 28 03/08/2013   INR 1.41 07/06/2012   Labs reviewed from Dr. Joylene Draft in [place]. Glucose 148. All other chemistries are normal. CBC normal. Cholesterol 204, Trig-62, HDL- 57, LDL 135. A1c 6.4 %  Assessment / Plan:  1. PAF - has PPM in place. Remains on Flecainide 50 mg twice a day and Eliquis. He is maintaining sinus rhythm well. Last pacemaker check in [place] showed no mode switches.  2. HTN - BP is well controlled.  3. Chronic Diastolic HF - well compensated today. No change in her current therapy.   I will follow up in 6 months.

## 2013-07-05 ENCOUNTER — Other Ambulatory Visit: Payer: Self-pay | Admitting: Adult Health

## 2013-07-11 ENCOUNTER — Encounter: Payer: Self-pay | Admitting: Internal Medicine

## 2013-07-17 ENCOUNTER — Encounter: Payer: Self-pay | Admitting: Internal Medicine

## 2013-07-23 ENCOUNTER — Other Ambulatory Visit: Payer: Self-pay | Admitting: Cardiology

## 2013-08-07 ENCOUNTER — Encounter: Payer: Medicare Other | Admitting: Internal Medicine

## 2013-08-14 ENCOUNTER — Emergency Department (HOSPITAL_COMMUNITY): Payer: Medicare Other

## 2013-08-14 ENCOUNTER — Encounter (HOSPITAL_COMMUNITY): Payer: Self-pay | Admitting: Emergency Medicine

## 2013-08-14 ENCOUNTER — Emergency Department (HOSPITAL_COMMUNITY)
Admission: EM | Admit: 2013-08-14 | Discharge: 2013-08-14 | Disposition: A | Discharge status: Home / Self Care | Payer: Medicare Other | Attending: Emergency Medicine | Admitting: Emergency Medicine

## 2013-08-14 DIAGNOSIS — F3289 Other specified depressive episodes: Secondary | ICD-10-CM | POA: Insufficient documentation

## 2013-08-14 DIAGNOSIS — IMO0002 Reserved for concepts with insufficient information to code with codable children: Secondary | ICD-10-CM | POA: Diagnosis not present

## 2013-08-14 DIAGNOSIS — M810 Age-related osteoporosis without current pathological fracture: Secondary | ICD-10-CM | POA: Diagnosis not present

## 2013-08-14 DIAGNOSIS — E119 Type 2 diabetes mellitus without complications: Secondary | ICD-10-CM | POA: Diagnosis not present

## 2013-08-14 DIAGNOSIS — Z79899 Other long term (current) drug therapy: Secondary | ICD-10-CM | POA: Insufficient documentation

## 2013-08-14 DIAGNOSIS — Z87442 Personal history of urinary calculi: Secondary | ICD-10-CM | POA: Diagnosis not present

## 2013-08-14 DIAGNOSIS — Z950 Presence of cardiac pacemaker: Secondary | ICD-10-CM | POA: Insufficient documentation

## 2013-08-14 DIAGNOSIS — R0602 Shortness of breath: Secondary | ICD-10-CM | POA: Insufficient documentation

## 2013-08-14 DIAGNOSIS — I10 Essential (primary) hypertension: Secondary | ICD-10-CM | POA: Diagnosis not present

## 2013-08-14 DIAGNOSIS — R002 Palpitations: Secondary | ICD-10-CM | POA: Diagnosis not present

## 2013-08-14 DIAGNOSIS — I4891 Unspecified atrial fibrillation: Secondary | ICD-10-CM | POA: Diagnosis not present

## 2013-08-14 DIAGNOSIS — R5381 Other malaise: Secondary | ICD-10-CM | POA: Diagnosis not present

## 2013-08-14 DIAGNOSIS — F329 Major depressive disorder, single episode, unspecified: Secondary | ICD-10-CM | POA: Diagnosis not present

## 2013-08-14 DIAGNOSIS — R5383 Other fatigue: Secondary | ICD-10-CM | POA: Diagnosis not present

## 2013-08-14 DIAGNOSIS — F411 Generalized anxiety disorder: Secondary | ICD-10-CM | POA: Insufficient documentation

## 2013-08-14 DIAGNOSIS — I509 Heart failure, unspecified: Secondary | ICD-10-CM | POA: Diagnosis not present

## 2013-08-14 DIAGNOSIS — R0789 Other chest pain: Secondary | ICD-10-CM | POA: Diagnosis not present

## 2013-08-14 DIAGNOSIS — Z85828 Personal history of other malignant neoplasm of skin: Secondary | ICD-10-CM | POA: Diagnosis not present

## 2013-08-14 LAB — BASIC METABOLIC PANEL
Anion gap: 13 (ref 5–15)
BUN: 20 mg/dL (ref 6–23)
CHLORIDE: 103 meq/L (ref 96–112)
CO2: 24 mEq/L (ref 19–32)
CREATININE: 0.62 mg/dL (ref 0.50–1.10)
Calcium: 9.7 mg/dL (ref 8.4–10.5)
GFR, EST AFRICAN AMERICAN: 87 mL/min — AB (ref >90)
GFR, EST NON AFRICAN AMERICAN: 75 mL/min — AB (ref >90)
Glucose, Bld: 117 mg/dL — ABNORMAL HIGH (ref 70–99)
POTASSIUM: 4.3 meq/L (ref 3.7–5.3)
Sodium: 140 mEq/L (ref 137–147)

## 2013-08-14 LAB — I-STAT TROPONIN, ED: Troponin i, poc: 0 ng/mL (ref 0.00–0.08)

## 2013-08-14 LAB — CBC
HCT: 39.4 % (ref 36.0–46.0)
HEMOGLOBIN: 13.3 g/dL (ref 12.0–15.0)
MCH: 31.4 pg (ref 26.0–34.0)
MCHC: 33.8 g/dL (ref 30.0–36.0)
MCV: 93.1 fL (ref 78.0–100.0)
Platelets: 212 10*3/uL (ref 150–400)
RBC: 4.23 MIL/uL (ref 3.87–5.11)
RDW: 13.2 % (ref 11.5–15.5)
WBC: 12.5 10*3/uL — AB (ref 4.0–10.5)

## 2013-08-14 LAB — PRO B NATRIURETIC PEPTIDE: PRO B NATRI PEPTIDE: 252.4 pg/mL (ref 0–450)

## 2013-08-14 MED ORDER — LORAZEPAM 1 MG PO TABS
0.5000 mg | ORAL_TABLET | Freq: Once | ORAL | Status: AC
  Administered 2013-08-14: 0.5 mg via ORAL
  Filled 2013-08-14: qty 1

## 2013-08-14 NOTE — ED Provider Notes (Signed)
CSN: [id number]     Arrival date & time 08/14/13  1337 History   First MD Initiated Contact with Patient 08/14/13 1341     Chief Complaint  Patient presents with  . Palpitations  . Chest Pain     (Consider location/radiation/quality/duration/timing/severity/associated sxs/prior Treatment) HPI Comments: Patient presents to the ER for evaluation of palpitations. Patient reports onset of palpitations associated with a heaviness in the chest and shortness of breath earlier today. These symptoms have resolved. Patient reports generalized weakness upon arrival to the ER. Patient does have a history of atrial fibrillation. She reports that prior to today, she has not had any palpitations in a very long time.  Patient is a 80 y.o. female presenting with palpitations and chest pain.  Palpitations Associated symptoms: chest pain and shortness of breath   Chest Pain Associated symptoms: palpitations and shortness of breath     Past Medical History  Diagnosis Date  . History of aortic insufficiency   . Anxiety   . Hypertension   . Pacemaker   . PAF (paroxysmal atrial fibrillation)     a. s/p pacemaker. b. s/p Medtronic gen change 07/2011.  . SSS (sick sinus syndrome)   . Type II diabetes mellitus     "controlled w/diet"  . Skin cancer ?1960's    "between breasts"  . Arthritis     "hands; between shoulders; back; feet"  . Panic attacks     "since losing husband 1994"  . Depression   . High risk medication use     on Flecainide  . Chronic anticoagulation   . Osteoporosis   . CHF (congestive heart failure)   . Kidney stone     pased in [place] 2014   Past Surgical History  Procedure Laterality Date  . Cesarean section  1955  . Cardiovascular stress test  12/09/2004  . Cataract extraction w/ intraocular lens  implant, bilateral  1990's  . Tonsillectomy      "school age"  . Appendectomy  1955  . Dilation and curettage of uterus  1953  . Breast cyst excision  ~ 1950    right  .  Insert / replace / remove pacemaker  03/16/2006  . Skin cancer excision  ?1960's    "between my breasts"   Family History  Problem Relation Age of Onset  . Emphysema Father    History  Substance Use Topics  . Smoking status: Never Smoker   . Smokeless tobacco: Never Used  . Alcohol Use: No   OB History   Grav Para Term Preterm Abortions TAB SAB Ect Mult Living                 Review of Systems  Respiratory: Positive for shortness of breath.   Cardiovascular: Positive for chest pain and palpitations.  All other systems reviewed and are negative.     Allergies  Sulfonamide derivatives and Citalopram hydrobromide  Home Medications   Prior to Admission medications   Medication Sig Start Date End Date Taking? Authorizing Provider  Calcium Carbonate (CALCIUM 600 PO) Take 600 mg by mouth daily.     Historical Provider, MD  carvedilol (COREG) 3.125 MG tablet Take 3.125 mg by mouth 2 (two) times daily with a meal.    Historical Provider, MD  diltiazem (CARDIZEM CD) 240 MG 24 hr capsule Take 1 capsule (240 mg total) by mouth daily. 07/11/12   Ripudeep Krystal Eaton, MD  ELIQUIS 2.5 MG TABS tablet TAKE ONE TABLET BY MOUTH TWICE DAILY  07/23/13   Peter M Martinique, MD  estradiol (ESTRACE) 0.1 MG/GM vaginal cream Place 2 g vaginally 3 (three) times a week. Monday, Wednesday, friday    Historical Provider, MD  flecainide (TAMBOCOR) 50 MG tablet Take 50 mg by mouth 2 (two) times daily.    Historical Provider, MD  flecainide (TAMBOCOR) 50 MG tablet TAKE 1 TABLET BY MOUTH EVERY 12 HOURS 07/05/13   Peter M Martinique, MD  LORazepam (ATIVAN) 0.5 MG tablet Take 0.25-0.5 mg by mouth See admin instructions. Patient takes 0.5 tablet (0.25 mg) as needed for anxiety during the day. Patient takes one tablet (0.5 mg) every night.    Historical Provider, MD  rosuvastatin (CRESTOR) 5 MG tablet Take 2.5 mg by mouth every Monday, Wednesday, and Friday.     Historical Provider, MD  sertraline (ZOLOFT) 50 MG tablet Take 50 mg  by mouth every morning.     Historical Provider, MD  Vitamin D, Ergocalciferol, (DRISDOL) 50000 UNITS CAPS capsule Take 50,000 Units by mouth every Wednesday.    Historical Provider, MD   BP 127/57  Pulse 63  Temp(Src) 98.2 F (36.8 C) (Oral)  Resp 29  SpO2 98% Physical Exam  Constitutional: She is oriented to person, place, and time. She appears well-developed and well-nourished. No distress.  HENT:  Head: Normocephalic and atraumatic.  Right Ear: Hearing normal.  Left Ear: Hearing normal.  Nose: Nose normal.  Mouth/Throat: Oropharynx is clear and moist and mucous membranes are normal.  Eyes: Conjunctivae and EOM are normal. Pupils are equal, round, and reactive to light.  Neck: Normal range of motion. Neck supple.  Cardiovascular: Regular rhythm, S1 normal and S2 normal.  Exam reveals no gallop and no friction rub.   No murmur heard. Pulmonary/Chest: Effort normal and breath sounds normal. No respiratory distress. She exhibits no tenderness.  Abdominal: Soft. Normal appearance and bowel sounds are normal. There is no hepatosplenomegaly. There is no tenderness. There is no rebound, no guarding, no tenderness at McBurney's point and negative Murphy's sign. No hernia.  Musculoskeletal: Normal range of motion.  Neurological: She is alert and oriented to person, place, and time. She has normal strength. No cranial nerve deficit or sensory deficit. Coordination normal. GCS eye subscore is 4. GCS verbal subscore is 5. GCS motor subscore is 6.  Skin: Skin is warm, dry and intact. No rash noted. No cyanosis.  Psychiatric: She has a normal mood and affect. Her speech is normal and behavior is normal. Thought content normal.    ED Course  Procedures (including critical care time) Labs Review Labs Reviewed  CBC - Abnormal; Notable for the following:    WBC 12.5 (*)    All other components within normal limits  BASIC METABOLIC PANEL - Abnormal; Notable for the following:    Glucose, Bld 117  (*)    GFR calc non Af Amer 75 (*)    GFR calc Af Amer 87 (*)    All other components within normal limits  PRO B NATRIURETIC PEPTIDE  I-STAT TROPOININ, ED    Imaging Review Dg Chest 2 View  08/14/2013   CLINICAL DATA:  80 year old female shortness of breath and palpitations. Initial encounter.  EXAM: CHEST  2 VIEW  COMPARISON:  03/08/2013 and earlier.  FINDINGS: Stable left chest cardiac pacemaker. Stable cardiac size and mediastinal contours. Stable lung volumes. No pneumothorax, pulmonary edema, pleural effusion or acute pulmonary opacity. Stable visualized osseous structures.  IMPRESSION: No acute cardiopulmonary abnormality.   Electronically Signed   By:  Lars Pinks M.D.   On: 08/14/2013 15:07     EKG Interpretation   Date/Time:  Wednesday August 14 2013 13:50:32 EDT Ventricular Rate:  64 PR Interval:  155 QRS Duration: 104 QT Interval:  423 QTC Calculation: 436 R Axis:   4 Text Interpretation:  Sinus rhythm Normal ECG Confirmed by Hady Niemczyk  MD,  Kaira Stringfield [id number]) on 08/14/2013 2:00:17 PM      MDM   Final diagnoses:  Palpitations   Presents to the ER for evaluation of palpitations. She does have a history of atrial fibrillation, but is currently in sinus rhythm. Patient's workup has been negative here in the ER. There has not been any noted arrhythmia on the monitor. Patient is anxious. She informs me that she has missed 2 doses of her Ativan today, was given Ativan here in the ER. Patient began to feel much better. She is appropriate for discharge, followup with her primary doctor. Return if symptoms worsen.    Orpah Greek, MD 08/15/13 1259

## 2013-08-14 NOTE — ED Notes (Signed)
Patient transported to X-ray 

## 2013-08-14 NOTE — ED Notes (Signed)
Daughter states patient takes ativan 3 times and does not "do well" without it.

## 2013-08-14 NOTE — ED Notes (Signed)
Pt states palpations, chest heaviness, and generalized weakness since 1030. Pt denies palpations at this time continues to c/o generalized weakness and "heaviness in my heart." alertx4 respirations easy non labored

## 2013-08-14 NOTE — Discharge Instructions (Signed)
Palpitations °A palpitation is the feeling that your heartbeat is irregular or is faster than normal. It Lazalde feel like your heart is fluttering or skipping a beat. Palpitations are usually not a serious problem. However, in some cases, you Arganbright need further medical evaluation. °CAUSES  °Palpitations can be caused by: °· Smoking. °· Caffeine or other stimulants, such as diet pills or energy drinks. °· Alcohol. °· Stress and anxiety. °· Strenuous physical activity. °· Fatigue. °· Certain medicines. °· Heart disease, especially if you have a history of irregular heart rhythms (arrhythmias), such as atrial fibrillation, atrial flutter, or supraventricular tachycardia. °· An improperly working pacemaker or defibrillator. °DIAGNOSIS  °To find the cause of your palpitations, your health care provider will take your medical history and perform a physical exam. Your health care provider Tester also have you take a test called an ambulatory electrocardiogram (ECG). An ECG records your heartbeat patterns over a 24-hour period. You Hornaday also have other tests, such as: °· Transthoracic echocardiogram (TTE). During echocardiography, sound waves are used to evaluate how blood flows through your heart. °· Transesophageal echocardiogram (TEE). °· Cardiac monitoring. This allows your health care provider to monitor your heart rate and rhythm in real time. °· Holter monitor. This is a portable device that records your heartbeat and can help diagnose heart arrhythmias. It allows your health care provider to track your heart activity for several days, if needed. °· Stress tests by exercise or by giving medicine that makes the heart beat faster. °TREATMENT  °Treatment of palpitations depends on the cause of your symptoms and can vary greatly. Most cases of palpitations do not require any treatment other than time, relaxation, and monitoring your symptoms. Other causes, such as atrial fibrillation, atrial flutter, or supraventricular  tachycardia, usually require further treatment. °HOME CARE INSTRUCTIONS  °· Avoid: °¨ Caffeinated coffee, tea, soft drinks, diet pills, and energy drinks. °¨ Chocolate. °¨ Alcohol. °· Stop smoking if you smoke. °· Reduce your stress and anxiety. Things that can help you relax include: °¨ A method of controlling things in your body, such as your heartbeats, with your mind (biofeedback). °¨ Yoga. °¨ Meditation. °¨ Physical activity such as swimming, jogging, or walking. °· Get plenty of rest and sleep. °SEEK MEDICAL CARE IF:  °· You continue to have a fast or irregular heartbeat beyond 24 hours. °· Your palpitations occur more often. °SEEK IMMEDIATE MEDICAL CARE IF: °· You have chest pain or shortness of breath. °· You have a severe headache. °· You feel dizzy or you faint. °MAKE SURE YOU: °· Understand these instructions. °· Will watch your condition. °· Will get help right away if you are not doing well or get worse. °Document Released: 01/08/2000 Document Revised: 01/15/2013 Document Reviewed: 03/11/2011 °ExitCare® Patient Information ©2015 [employer]. This information is not intended to replace advice given to you by your health care provider. Make sure you discuss any questions you have with your health care provider. ° °

## 2013-09-16 ENCOUNTER — Ambulatory Visit (INDEPENDENT_AMBULATORY_CARE_PROVIDER_SITE_OTHER): Payer: Medicare Other | Admitting: Internal Medicine

## 2013-09-16 ENCOUNTER — Encounter: Payer: Self-pay | Admitting: Internal Medicine

## 2013-09-16 VITALS — BP 105/58 | HR 67 | Ht 62.0 in | Wt 130.4 lb

## 2013-09-16 DIAGNOSIS — I4891 Unspecified atrial fibrillation: Secondary | ICD-10-CM

## 2013-09-16 DIAGNOSIS — I10 Essential (primary) hypertension: Secondary | ICD-10-CM | POA: Diagnosis not present

## 2013-09-16 DIAGNOSIS — I495 Sick sinus syndrome: Secondary | ICD-10-CM

## 2013-09-16 DIAGNOSIS — I480 Paroxysmal atrial fibrillation: Secondary | ICD-10-CM

## 2013-09-16 LAB — MDC_IDC_ENUM_SESS_TYPE_INCLINIC
Battery Impedance: 112 Ohm
Battery Voltage: 2.8 V
Brady Statistic AP VP Percent: 0 %
Brady Statistic AS VP Percent: 0 %
Brady Statistic AS VS Percent: 3 %
Date Time Interrogation Session: 20150824150531
Lead Channel Impedance Value: 405 Ohm
Lead Channel Impedance Value: 508 Ohm
Lead Channel Pacing Threshold Amplitude: 1 V
Lead Channel Pacing Threshold Pulse Width: 0.4 ms
Lead Channel Pacing Threshold Pulse Width: 0.4 ms
Lead Channel Sensing Intrinsic Amplitude: 8 mV
Lead Channel Setting Pacing Amplitude: 2.5 V
Lead Channel Setting Pacing Pulse Width: 0.4 ms
MDC IDC MSMT BATTERY REMAINING LONGEVITY: 158 mo
MDC IDC MSMT LEADCHNL RA PACING THRESHOLD AMPLITUDE: 0.75 V
MDC IDC SET LEADCHNL RA PACING AMPLITUDE: 2 V
MDC IDC SET LEADCHNL RV SENSING SENSITIVITY: 2.8 mV
MDC IDC STAT BRADY AP VS PERCENT: 97 %

## 2013-09-16 NOTE — Progress Notes (Signed)
PCP: Jerlyn Ly, MD Primary Cardiologist: Peter Martinique, MD  Jo [name] is a 80 y.o. female who presents today for routine electrophysiology followup.  She has done well since her last visit.  She has rare palpitations but no sustained afib.  She has difficulty with vision but is otherwise without complaint today. Today, she denies symptoms of chest pain, shortness of breath,  lower extremity edema, dizziness, presyncope, or syncope.  The patient is otherwise without complaint today.   Past Medical History  Diagnosis Date  . History of aortic insufficiency   . Anxiety   . Hypertension   . Pacemaker   . PAF (paroxysmal atrial fibrillation)     a. s/p pacemaker. b. s/p Medtronic gen change 07/2011.  . SSS (sick sinus syndrome)   . Type II diabetes mellitus     "controlled w/diet"  . Skin cancer ?1960's    "between breasts"  . Arthritis     "hands; between shoulders; back; feet"  . Panic attacks     "since losing husband 1994"  . Depression   . High risk medication use     on Flecainide  . Chronic anticoagulation   . Osteoporosis   . CHF (congestive heart failure)   . Kidney stone     pased in [place] 2014   Past Surgical History  Procedure Laterality Date  . Cesarean section  1955  . Cardiovascular stress test  12/09/2004  . Cataract extraction w/ intraocular lens  implant, bilateral  1990's  . Tonsillectomy      "school age"  . Appendectomy  1955  . Dilation and curettage of uterus  1953  . Breast cyst excision  ~ 1950    right  . Insert / replace / remove pacemaker  03/16/2006  . Skin cancer excision  ?1960's    "between my breasts"    Current Outpatient Prescriptions  Medication Sig Dispense Refill  . Calcium Carbonate (CALCIUM 600 PO) Take 600 mg by mouth daily.       . carvedilol (COREG) 3.125 MG tablet Take 3.125 mg by mouth 2 (two) times daily with a meal.      . diltiazem (CARDIZEM CD) 240 MG 24 hr capsule Take 240 mg by mouth every morning.      Marland Kitchen ELIQUIS 2.5  MG TABS tablet TAKE ONE TABLET BY MOUTH TWICE DAILY  60 tablet  5  . estradiol (ESTRACE) 0.1 MG/GM vaginal cream Place 2 g vaginally 3 (three) times a week. Monday, Wednesday, friday      . flecainide (TAMBOCOR) 50 MG tablet Take 50 mg by mouth 2 (two) times daily.      Marland Kitchen LORazepam (ATIVAN) 0.5 MG tablet Take 0.25-0.5 mg by mouth See admin instructions. Patient takes 0.5 tablet (0.25 mg) during the day and in the afternoon then take one tablet (0.5 mg) every night.      . rosuvastatin (CRESTOR) 5 MG tablet Take 2.5 mg by mouth every Monday, Wednesday, and Friday.       . sertraline (ZOLOFT) 50 MG tablet Take 50 mg by mouth every morning.       . Vitamin D, Ergocalciferol, (DRISDOL) 50000 UNITS CAPS capsule Take 50,000 Units by mouth every Wednesday.       No current facility-administered medications for this visit.   ROS- all systems are reviewed and negative except as per HPI above  Physical Exam: Filed Vitals:   09/16/13 1452  BP: 105/58  Pulse: 67  Height: 5\' 2"  (1.575 m)  Weight: 130 lb 6.4 oz (59.149 kg)    GEN- The patient is elderly appearing, alert and oriented x 3 today.   Head- normocephalic, atraumatic Eyes-  Sclera clear, conjunctiva pink Ears- hearing intact Oropharynx- clear Lungs- Clear to ausculation bilaterally, normal work of breathing Chest- pacemaker pocket is well healed Heart- Regular rate and rhythm, no murmurs, rubs or gallops, PMI not laterally displaced GI- soft, NT, ND, + BS Extremities- no clubbing, cyanosis, or edema  Pacemaker interrogation- reviewed in detail today,  See PACEART report ekg today reveals atrial pacing, PR 244, otherwise normal ekg  Assessment and Plan:  1. Sick sinus syndrome Normal pacemaker function See Pace Art report No changes today  2. Paroxysmal atrial fibrillation Maintaining sinus rhythm Continue eliquis No afib in last 12 months by PPM interrogation today  3. HTN Stable No change required today  Return to the  device clinic in 1 year

## 2013-09-16 NOTE — Patient Instructions (Signed)
Your physician wants you to follow-up in: 12 months with Dr Allred.  You will receive a reminder letter in the mail two months in advance. If you don't receive a letter, please call our office to schedule the follow-up appointment.   Remote monitoring is used to monitor your Pacemaker or ICD from home. This monitoring reduces the number of office visits required to check your device to one time per year. It allows us to keep an eye on the functioning of your device to ensure it is working properly. You are scheduled for a device check from home on 12/18/13. You Roepke send your transmission at any time that day. If you have a wireless device, the transmission will be sent automatically. After your physician reviews your transmission, you will receive a postcard with your next transmission date.    

## 2013-09-19 ENCOUNTER — Other Ambulatory Visit: Payer: Self-pay

## 2013-09-19 MED ORDER — FLECAINIDE ACETATE 50 MG PO TABS: 50.0000 mg | ORAL_TABLET | Freq: Two times a day (BID) | ORAL | Status: DC

## 2013-09-20 ENCOUNTER — Encounter: Payer: Self-pay | Admitting: Internal Medicine

## 2013-10-16 DIAGNOSIS — Z23 Encounter for immunization: Secondary | ICD-10-CM | POA: Diagnosis not present

## 2013-10-16 DIAGNOSIS — Z1331 Encounter for screening for depression: Secondary | ICD-10-CM | POA: Diagnosis not present

## 2013-10-16 DIAGNOSIS — I4891 Unspecified atrial fibrillation: Secondary | ICD-10-CM | POA: Diagnosis not present

## 2013-10-16 DIAGNOSIS — M1990 Unspecified osteoarthritis, unspecified site: Secondary | ICD-10-CM | POA: Diagnosis not present

## 2013-10-16 DIAGNOSIS — H3530 Unspecified macular degeneration: Secondary | ICD-10-CM | POA: Diagnosis not present

## 2013-10-16 DIAGNOSIS — M810 Age-related osteoporosis without current pathological fracture: Secondary | ICD-10-CM | POA: Diagnosis not present

## 2013-10-16 DIAGNOSIS — F341 Dysthymic disorder: Secondary | ICD-10-CM | POA: Diagnosis not present

## 2013-10-16 DIAGNOSIS — I10 Essential (primary) hypertension: Secondary | ICD-10-CM | POA: Diagnosis not present

## 2013-10-16 DIAGNOSIS — E559 Vitamin D deficiency, unspecified: Secondary | ICD-10-CM | POA: Diagnosis not present

## 2013-10-16 DIAGNOSIS — E1139 Type 2 diabetes mellitus with other diabetic ophthalmic complication: Secondary | ICD-10-CM | POA: Diagnosis not present

## 2013-12-09 ENCOUNTER — Other Ambulatory Visit: Payer: Self-pay | Admitting: Internal Medicine

## 2013-12-09 DIAGNOSIS — Z872 Personal history of diseases of the skin and subcutaneous tissue: Secondary | ICD-10-CM

## 2013-12-09 DIAGNOSIS — R234 Changes in skin texture: Secondary | ICD-10-CM

## 2013-12-09 DIAGNOSIS — N649 Disorder of breast, unspecified: Secondary | ICD-10-CM | POA: Diagnosis not present

## 2013-12-09 DIAGNOSIS — Z6823 Body mass index (BMI) 23.0-23.9, adult: Secondary | ICD-10-CM | POA: Diagnosis not present

## 2013-12-18 ENCOUNTER — Other Ambulatory Visit: Payer: Self-pay | Admitting: Internal Medicine

## 2013-12-18 ENCOUNTER — Ambulatory Visit (INDEPENDENT_AMBULATORY_CARE_PROVIDER_SITE_OTHER): Payer: Medicare Other | Admitting: *Deleted

## 2013-12-18 ENCOUNTER — Ambulatory Visit
Admission: RE | Admit: 2013-12-18 | Discharge: 2013-12-18 | Disposition: A | Discharge status: Home / Self Care | Payer: Medicare Other | Source: Ambulatory Visit | Attending: Internal Medicine | Admitting: Internal Medicine

## 2013-12-18 DIAGNOSIS — N6320 Unspecified lump in the left breast, unspecified quadrant: Secondary | ICD-10-CM

## 2013-12-18 DIAGNOSIS — Z872 Personal history of diseases of the skin and subcutaneous tissue: Secondary | ICD-10-CM

## 2013-12-18 DIAGNOSIS — R234 Changes in skin texture: Secondary | ICD-10-CM

## 2013-12-18 DIAGNOSIS — N6310 Unspecified lump in the right breast, unspecified quadrant: Secondary | ICD-10-CM

## 2013-12-18 DIAGNOSIS — N63 Unspecified lump in breast: Secondary | ICD-10-CM | POA: Diagnosis not present

## 2013-12-18 DIAGNOSIS — I495 Sick sinus syndrome: Secondary | ICD-10-CM

## 2013-12-18 NOTE — Progress Notes (Signed)
Remote pacemaker transmission.   

## 2013-12-21 LAB — MDC_IDC_ENUM_SESS_TYPE_REMOTE
Battery Impedance: 135 Ohm
Battery Remaining Longevity: 135 mo
Battery Voltage: 2.8 V
Brady Statistic AS VP Percent: 0 %
Lead Channel Pacing Threshold Amplitude: 1 V
Lead Channel Pacing Threshold Pulse Width: 0.4 ms
Lead Channel Sensing Intrinsic Amplitude: 8 mV
Lead Channel Setting Pacing Amplitude: 2 V
Lead Channel Setting Pacing Amplitude: 2.5 V
Lead Channel Setting Pacing Pulse Width: 0.4 ms
Lead Channel Setting Sensing Sensitivity: 2.8 mV
MDC IDC MSMT LEADCHNL RA IMPEDANCE VALUE: 523 Ohm
MDC IDC MSMT LEADCHNL RA PACING THRESHOLD AMPLITUDE: 0.625 V
MDC IDC MSMT LEADCHNL RA PACING THRESHOLD PULSEWIDTH: 0.4 ms
MDC IDC MSMT LEADCHNL RV IMPEDANCE VALUE: 426 Ohm
MDC IDC SESS DTM: 20151125154218
MDC IDC STAT BRADY AP VP PERCENT: 0 %
MDC IDC STAT BRADY AP VS PERCENT: 98 %
MDC IDC STAT BRADY AS VS PERCENT: 2 %

## 2013-12-31 ENCOUNTER — Encounter: Payer: Self-pay | Admitting: Cardiology

## 2014-01-02 ENCOUNTER — Encounter (HOSPITAL_COMMUNITY): Payer: Self-pay | Admitting: Internal Medicine

## 2014-01-06 ENCOUNTER — Ambulatory Visit
Admission: RE | Admit: 2014-01-06 | Discharge: 2014-01-06 | Disposition: A | Discharge status: Home / Self Care | Payer: Medicare Other | Source: Ambulatory Visit | Attending: Internal Medicine | Admitting: Internal Medicine

## 2014-01-06 ENCOUNTER — Other Ambulatory Visit: Payer: Self-pay | Admitting: Internal Medicine

## 2014-01-06 ENCOUNTER — Encounter (INDEPENDENT_AMBULATORY_CARE_PROVIDER_SITE_OTHER): Payer: Self-pay

## 2014-01-06 DIAGNOSIS — N6310 Unspecified lump in the right breast, unspecified quadrant: Secondary | ICD-10-CM

## 2014-01-06 DIAGNOSIS — Z872 Personal history of diseases of the skin and subcutaneous tissue: Secondary | ICD-10-CM

## 2014-01-06 DIAGNOSIS — R234 Changes in skin texture: Secondary | ICD-10-CM

## 2014-01-06 DIAGNOSIS — N63 Unspecified lump in breast: Secondary | ICD-10-CM | POA: Diagnosis not present

## 2014-01-06 DIAGNOSIS — N6320 Unspecified lump in the left breast, unspecified quadrant: Secondary | ICD-10-CM

## 2014-01-06 DIAGNOSIS — C50212 Malignant neoplasm of upper-inner quadrant of left female breast: Secondary | ICD-10-CM | POA: Diagnosis not present

## 2014-01-06 DIAGNOSIS — C50311 Malignant neoplasm of lower-inner quadrant of right female breast: Secondary | ICD-10-CM | POA: Diagnosis not present

## 2014-01-08 ENCOUNTER — Encounter: Payer: Self-pay | Admitting: Internal Medicine

## 2014-01-10 DIAGNOSIS — C50919 Malignant neoplasm of unspecified site of unspecified female breast: Secondary | ICD-10-CM | POA: Diagnosis not present

## 2014-01-20 ENCOUNTER — Other Ambulatory Visit (INDEPENDENT_AMBULATORY_CARE_PROVIDER_SITE_OTHER): Payer: Self-pay | Admitting: General Surgery

## 2014-01-22 ENCOUNTER — Other Ambulatory Visit (INDEPENDENT_AMBULATORY_CARE_PROVIDER_SITE_OTHER): Payer: Self-pay | Admitting: General Surgery

## 2014-01-22 DIAGNOSIS — C50919 Malignant neoplasm of unspecified site of unspecified female breast: Secondary | ICD-10-CM

## 2014-01-28 ENCOUNTER — Other Ambulatory Visit: Payer: Self-pay | Admitting: *Deleted

## 2014-01-28 MED ORDER — APIXABAN 2.5 MG PO TABS: 2.5000 mg | ORAL_TABLET | Freq: Two times a day (BID) | ORAL | Status: DC

## 2014-01-30 ENCOUNTER — Telehealth: Payer: Self-pay

## 2014-01-30 NOTE — Telephone Encounter (Signed)
Received a message from patient's granddaughter.Patient scheduled to have a lumpectomy Monday 02/03/14.Dr.Jordan advised will need to hold Eliquis 72 hours before surgery.Spoke to [place] at [facility] advised patient will need to hold Eliquis 72 hours before surgery.

## 2014-01-31 ENCOUNTER — Encounter (HOSPITAL_COMMUNITY)
Admission: RE | Admit: 2014-01-31 | Discharge: 2014-01-31 | Disposition: A | Discharge status: Home / Self Care | Payer: Medicare Other | Source: Ambulatory Visit | Attending: General Surgery | Admitting: General Surgery

## 2014-01-31 ENCOUNTER — Encounter (HOSPITAL_COMMUNITY): Payer: Self-pay

## 2014-01-31 DIAGNOSIS — I10 Essential (primary) hypertension: Secondary | ICD-10-CM | POA: Diagnosis not present

## 2014-01-31 DIAGNOSIS — I4891 Unspecified atrial fibrillation: Secondary | ICD-10-CM | POA: Diagnosis not present

## 2014-01-31 DIAGNOSIS — E780 Pure hypercholesterolemia: Secondary | ICD-10-CM | POA: Diagnosis not present

## 2014-01-31 DIAGNOSIS — C50911 Malignant neoplasm of unspecified site of right female breast: Secondary | ICD-10-CM | POA: Diagnosis not present

## 2014-01-31 DIAGNOSIS — C50912 Malignant neoplasm of unspecified site of left female breast: Secondary | ICD-10-CM | POA: Diagnosis not present

## 2014-01-31 DIAGNOSIS — E119 Type 2 diabetes mellitus without complications: Secondary | ICD-10-CM | POA: Diagnosis not present

## 2014-01-31 DIAGNOSIS — F329 Major depressive disorder, single episode, unspecified: Secondary | ICD-10-CM | POA: Diagnosis not present

## 2014-01-31 DIAGNOSIS — F419 Anxiety disorder, unspecified: Secondary | ICD-10-CM | POA: Diagnosis not present

## 2014-01-31 DIAGNOSIS — I509 Heart failure, unspecified: Secondary | ICD-10-CM | POA: Diagnosis not present

## 2014-01-31 DIAGNOSIS — N289 Disorder of kidney and ureter, unspecified: Secondary | ICD-10-CM | POA: Diagnosis not present

## 2014-01-31 DIAGNOSIS — M1990 Unspecified osteoarthritis, unspecified site: Secondary | ICD-10-CM | POA: Diagnosis not present

## 2014-01-31 DIAGNOSIS — Z7901 Long term (current) use of anticoagulants: Secondary | ICD-10-CM | POA: Diagnosis not present

## 2014-01-31 DIAGNOSIS — Z882 Allergy status to sulfonamides status: Secondary | ICD-10-CM | POA: Diagnosis not present

## 2014-01-31 LAB — BASIC METABOLIC PANEL
ANION GAP: 9 (ref 5–15)
BUN: 16 mg/dL (ref 6–23)
CHLORIDE: 107 meq/L (ref 96–112)
CO2: 25 mmol/L (ref 19–32)
Calcium: 9.9 mg/dL (ref 8.4–10.5)
Creatinine, Ser: 0.73 mg/dL (ref 0.50–1.10)
GFR calc Af Amer: 82 mL/min — ABNORMAL LOW (ref >90)
GFR calc non Af Amer: 71 mL/min — ABNORMAL LOW (ref >90)
GLUCOSE: 133 mg/dL — AB (ref 70–99)
POTASSIUM: 4.4 mmol/L (ref 3.5–5.1)
Sodium: 141 mmol/L (ref 135–145)

## 2014-01-31 LAB — CBC
HEMATOCRIT: 40.7 % (ref 36.0–46.0)
Hemoglobin: 13.7 g/dL (ref 12.0–15.0)
MCH: 31.9 pg (ref 26.0–34.0)
MCHC: 33.7 g/dL (ref 30.0–36.0)
MCV: 94.9 fL (ref 78.0–100.0)
Platelets: 199 10*3/uL (ref 150–400)
RBC: 4.29 MIL/uL (ref 3.87–5.11)
RDW: 13 % (ref 11.5–15.5)
WBC: 9.4 10*3/uL (ref 4.0–10.5)

## 2014-01-31 NOTE — Progress Notes (Signed)
Anesthesia PAT Evaluation: Patient is a 81 year old female scheduled for bilateral breast lumpectomy with seeds or wire localization on the left on 02/03/14 by Dr. Excell Seltzer.  History includes non-smoker, HTN, SSS s/p Medtronic PPM (generator change '13), PAF, moderate AI by 2013 echo, chronic diastolic CHF, diet controlled DM2, anxiety with panic attacks, arthritis. PCP is listed as Dr. Crist Infante.  Cardiologist is Dr. Martinique who is aware of plans for surgery and recommended holding Eliquis for 72 hours prior to surgery (last dose 01/31/14 AM). EP cardiologist is Dr. Rayann Heman.   Meds include calcium, Ativan, Vitamin D, Eliquis, Coreg, Cardizem, Estrace, flecainide, Crestor, Zoloft.  07/23/11 Echo: - Left ventricle: The cavity size was normal. Wall thickness was increased in a pattern of mild LVH. Systolic functionwas normal. The estimated ejection fraction was in therange of 60% to 65%. Wall motion was normal; there were noregional wall motion abnormalities. Left ventriculardiastolic function parameters were normal. - Aortic valve: Moderate regurgitation. - Mitral valve: Calcified annulus. Mildly thickened leaflets. - Atrial septum: No defect or patent foramen ovale was identified. - Inferior vena cava: The vessel was normal in size; therespirophasic diameter changes were in the normal range (=50%); findings are consistent with normal central venous pressure. - Pericardium, extracardiac: There was no pericardial effusion. Impressions: The right ventricular systolic pressure was increased consistent with mild pulmonary hypertension.  09/16/13 EKG: a-paced rhythm with first degree AVB.  08/14/13 CXR: No acute cardiopulmonary abnormality.  Preoperative labs noted.  Patient presents with her daughter.  She is using a rolling walker today, but overall appear to be a little younger than her known age.  She answers questions appropriately but is hard of hearing. Heart RRR, no murmur noted.  Lungs  clear. No pretibial edema. Good mouth opening (history of crowns but no partials). She feels at her baseline from a CV standpoint.  No chest pain, SOB, significant palpitations, or edema.  PPM is located on her left chest wall. She is aware that case is posted for GA and that GA is anticipated. She has only had prior anesthesia 60 years ago or greater (for child birth and tonsillectomy).  No known anesthesia complications. She says that Dr. Excell Seltzer plans to admit her for 23 hour observation.  I have updated anesthesiologist Dr. Tamala Julian regarding her history. She will be further evaluated by her assigned anesthesiologist on the day of surgery.  PPM RX form still pending from Dr. Jackalyn Lombard office.   George Hugh [facility] Short Stay Center/Anesthesiology Phone [phone] 01/31/2014 4:03 PM

## 2014-01-31 NOTE — Progress Notes (Signed)
NOTIFIED ALLISON ZELENAK OF PATIENT HAVING ORDER FOR ANESTHESIA CONSULT.

## 2014-01-31 NOTE — Progress Notes (Signed)
Call to [facility] with [employer] & reported the pt.'s need for pacemaker asynchronous pacing  for during surgery, relative to order rec'd from device clinic today.

## 2014-01-31 NOTE — Pre-Procedure Instructions (Signed)
Jo [name]  01/31/2014   Your procedure is scheduled on:  Monday   02/03/14  Report to [facility] Admitting at 1230 PM.  Call this number if you have problems the morning of surgery: [phone]   Remember:   Do not eat food or drink liquids after midnight.   Take these medicines the morning of surgery with A SIP OF WATER:   CARVEDILOL(COREG), DILTIAZEM(CARDIZEM), ESTRADIOL(ESTRACE), FLECAINIDE(TAMBOCOR), LORAZEPAM, SERTRALINE(ZOLOFT)     (HOLD ELIQUIS 72 HOURS BEFORE SURGERY)   Do not wear jewelry, make-up or nail polish.  Do not wear lotions, powders, or perfumes. You Kainz wear deodorant.  Do not shave 48 hours prior to surgery. [facility] shave face and neck.  Do not bring valuables to the hospital.  [facility] is not responsible                  for any belongings or valuables.               Contacts, dentures or bridgework Louderback not be worn into surgery.  Leave suitcase in the car. After surgery it Jobe be brought to your room.  For patients admitted to the hospital, discharge time is determined by your                treatment team.               Patients discharged the day of surgery will not be allowed to drive  home.  Name and phone number of your driver:   Special Instructions: Shower using CHG 2 nights before surgery and the night before surgery.  If you shower the day of surgery use CHG.  Use special wash - you have one bottle of CHG for all showers.  You should use approximately 1/3 of the bottle for each shower.   Please read over the following fact sheets that you were given: Pain Booklet, Coughing and Deep Breathing, Blood Transfusion Information, Total Joint Packet, MRSA Information and Surgical Site Infection Prevention

## 2014-02-02 MED ORDER — CEFAZOLIN SODIUM-DEXTROSE 2-3 GM-% IV SOLR
2.0000 g | INTRAVENOUS | Status: AC
  Administered 2014-02-03: 2 g via INTRAVENOUS
  Filled 2014-02-02: qty 50

## 2014-02-03 ENCOUNTER — Observation Stay (HOSPITAL_COMMUNITY)
Admission: RE | Admit: 2014-02-03 | Discharge: 2014-02-04 | Disposition: A | Discharge status: Home / Self Care | Payer: Medicare Other | Source: Ambulatory Visit | Attending: General Surgery | Admitting: General Surgery

## 2014-02-03 ENCOUNTER — Encounter (HOSPITAL_COMMUNITY)
Admission: RE | Disposition: A | Discharge status: Home / Self Care | Payer: Self-pay | Source: Ambulatory Visit | Attending: General Surgery

## 2014-02-03 ENCOUNTER — Encounter (HOSPITAL_COMMUNITY): Payer: Self-pay | Admitting: *Deleted

## 2014-02-03 ENCOUNTER — Ambulatory Visit
Admission: RE | Admit: 2014-02-03 | Discharge: 2014-02-03 | Disposition: A | Discharge status: Home / Self Care | Payer: Medicare Other | Source: Ambulatory Visit | Attending: General Surgery | Admitting: General Surgery

## 2014-02-03 ENCOUNTER — Ambulatory Visit (HOSPITAL_COMMUNITY): Payer: Medicare Other | Admitting: Anesthesiology

## 2014-02-03 ENCOUNTER — Ambulatory Visit: Payer: Medicare Other | Admitting: Cardiology

## 2014-02-03 ENCOUNTER — Ambulatory Visit (HOSPITAL_COMMUNITY): Payer: Medicare Other | Admitting: Vascular Surgery

## 2014-02-03 DIAGNOSIS — Z7901 Long term (current) use of anticoagulants: Secondary | ICD-10-CM | POA: Insufficient documentation

## 2014-02-03 DIAGNOSIS — C50912 Malignant neoplasm of unspecified site of left female breast: Secondary | ICD-10-CM | POA: Insufficient documentation

## 2014-02-03 DIAGNOSIS — F419 Anxiety disorder, unspecified: Secondary | ICD-10-CM | POA: Insufficient documentation

## 2014-02-03 DIAGNOSIS — N63 Unspecified lump in breast: Secondary | ICD-10-CM | POA: Diagnosis not present

## 2014-02-03 DIAGNOSIS — C50919 Malignant neoplasm of unspecified site of unspecified female breast: Secondary | ICD-10-CM | POA: Diagnosis present

## 2014-02-03 DIAGNOSIS — E780 Pure hypercholesterolemia: Secondary | ICD-10-CM | POA: Insufficient documentation

## 2014-02-03 DIAGNOSIS — I10 Essential (primary) hypertension: Secondary | ICD-10-CM | POA: Insufficient documentation

## 2014-02-03 DIAGNOSIS — I509 Heart failure, unspecified: Secondary | ICD-10-CM | POA: Insufficient documentation

## 2014-02-03 DIAGNOSIS — I4891 Unspecified atrial fibrillation: Secondary | ICD-10-CM | POA: Insufficient documentation

## 2014-02-03 DIAGNOSIS — C50911 Malignant neoplasm of unspecified site of right female breast: Principal | ICD-10-CM | POA: Insufficient documentation

## 2014-02-03 DIAGNOSIS — M1990 Unspecified osteoarthritis, unspecified site: Secondary | ICD-10-CM | POA: Insufficient documentation

## 2014-02-03 DIAGNOSIS — N289 Disorder of kidney and ureter, unspecified: Secondary | ICD-10-CM | POA: Insufficient documentation

## 2014-02-03 DIAGNOSIS — E119 Type 2 diabetes mellitus without complications: Secondary | ICD-10-CM | POA: Insufficient documentation

## 2014-02-03 DIAGNOSIS — F329 Major depressive disorder, single episode, unspecified: Secondary | ICD-10-CM | POA: Insufficient documentation

## 2014-02-03 DIAGNOSIS — Z882 Allergy status to sulfonamides status: Secondary | ICD-10-CM | POA: Insufficient documentation

## 2014-02-03 HISTORY — PX: BREAST LUMPECTOMY WITH NEEDLE LOCALIZATION: SHX5759

## 2014-02-03 LAB — GLUCOSE, CAPILLARY
Glucose-Capillary: 126 mg/dL — ABNORMAL HIGH (ref 70–99)
Glucose-Capillary: 127 mg/dL — ABNORMAL HIGH (ref 70–99)
Glucose-Capillary: 161 mg/dL — ABNORMAL HIGH (ref 70–99)

## 2014-02-03 SURGERY — BREAST LUMPECTOMY WITH NEEDLE LOCALIZATION
Anesthesia: General | Site: Breast | Laterality: Bilateral

## 2014-02-03 MED ORDER — DILTIAZEM HCL ER COATED BEADS 240 MG PO CP24
240.0000 mg | ORAL_CAPSULE | Freq: Every day | ORAL | Status: DC
  Administered 2014-02-04: 240 mg via ORAL
  Filled 2014-02-03: qty 1

## 2014-02-03 MED ORDER — FLECAINIDE ACETATE 50 MG PO TABS
50.0000 mg | ORAL_TABLET | Freq: Two times a day (BID) | ORAL | Status: DC
  Administered 2014-02-03 – 2014-02-04 (×2): 50 mg via ORAL
  Filled 2014-02-03 (×4): qty 1

## 2014-02-03 MED ORDER — BUPIVACAINE-EPINEPHRINE 0.5% -1:200000 IJ SOLN
INTRAMUSCULAR | Status: DC | PRN
  Administered 2014-02-03: 30 mL

## 2014-02-03 MED ORDER — ONDANSETRON HCL 4 MG/2ML IJ SOLN
INTRAMUSCULAR | Status: AC
  Filled 2014-02-03: qty 2

## 2014-02-03 MED ORDER — ONDANSETRON HCL 4 MG/2ML IJ SOLN
INTRAMUSCULAR | Status: DC | PRN
  Administered 2014-02-03: 4 mg via INTRAVENOUS

## 2014-02-03 MED ORDER — LORAZEPAM 0.5 MG PO TABS
0.2500 mg | ORAL_TABLET | Freq: Two times a day (BID) | ORAL | Status: DC
  Administered 2014-02-04: 0.25 mg via ORAL
  Filled 2014-02-03: qty 1

## 2014-02-03 MED ORDER — HYDROCODONE-ACETAMINOPHEN 5-325 MG PO TABS: 1.0000 | ORAL_TABLET | ORAL | Status: DC | PRN

## 2014-02-03 MED ORDER — LIDOCAINE HCL (CARDIAC) 20 MG/ML IV SOLN
INTRAVENOUS | Status: DC | PRN
  Administered 2014-02-03: 40 mg via INTRAVENOUS

## 2014-02-03 MED ORDER — SERTRALINE HCL 50 MG PO TABS
50.0000 mg | ORAL_TABLET | Freq: Every morning | ORAL | Status: DC
  Administered 2014-02-04: 50 mg via ORAL
  Filled 2014-02-03: qty 1

## 2014-02-03 MED ORDER — 0.9 % SODIUM CHLORIDE (POUR BTL) OPTIME
TOPICAL | Status: DC | PRN
  Administered 2014-02-03: 1000 mL

## 2014-02-03 MED ORDER — FENTANYL CITRATE 0.05 MG/ML IJ SOLN: 25.0000 ug | INTRAMUSCULAR | Status: DC | PRN

## 2014-02-03 MED ORDER — ROSUVASTATIN CALCIUM 5 MG PO TABS
2.5000 mg | ORAL_TABLET | ORAL | Status: DC
Start: 2014-02-05 — End: 2014-02-04

## 2014-02-03 MED ORDER — ACETAMINOPHEN 325 MG PO TABS: 325.0000 mg | ORAL_TABLET | Freq: Four times a day (QID) | ORAL | Status: DC | PRN

## 2014-02-03 MED ORDER — ONDANSETRON HCL 4 MG PO TABS: 4.0000 mg | ORAL_TABLET | Freq: Four times a day (QID) | ORAL | Status: DC | PRN

## 2014-02-03 MED ORDER — PROPOFOL 10 MG/ML IV BOLUS
INTRAVENOUS | Status: DC | PRN
  Administered 2014-02-03: 80 mg via INTRAVENOUS

## 2014-02-03 MED ORDER — FENTANYL CITRATE 0.05 MG/ML IJ SOLN
INTRAMUSCULAR | Status: DC | PRN
  Administered 2014-02-03: 50 ug via INTRAVENOUS

## 2014-02-03 MED ORDER — LIDOCAINE HCL (CARDIAC) 20 MG/ML IV SOLN
INTRAVENOUS | Status: AC
  Filled 2014-02-03: qty 5

## 2014-02-03 MED ORDER — FENTANYL CITRATE 0.05 MG/ML IJ SOLN
INTRAMUSCULAR | Status: AC
Start: 2014-02-03 — End: 2014-02-03
  Filled 2014-02-03: qty 5

## 2014-02-03 MED ORDER — EPHEDRINE SULFATE 50 MG/ML IJ SOLN
INTRAMUSCULAR | Status: DC | PRN
  Administered 2014-02-03 (×3): 10 mg via INTRAVENOUS
  Administered 2014-02-03: 15 mg via INTRAVENOUS
  Administered 2014-02-03: 5 mg via INTRAVENOUS

## 2014-02-03 MED ORDER — LACTATED RINGERS IV SOLN
INTRAVENOUS | Status: DC | PRN
  Administered 2014-02-03: 14:00:00 via INTRAVENOUS

## 2014-02-03 MED ORDER — CARVEDILOL 3.125 MG PO TABS
3.1250 mg | ORAL_TABLET | Freq: Two times a day (BID) | ORAL | Status: DC
  Administered 2014-02-03 – 2014-02-04 (×2): 3.125 mg via ORAL
  Filled 2014-02-03 (×4): qty 1

## 2014-02-03 MED ORDER — POTASSIUM CHLORIDE IN NACL 20-0.9 MEQ/L-% IV SOLN
INTRAVENOUS | Status: DC
  Administered 2014-02-03: 21:00:00 via INTRAVENOUS
  Filled 2014-02-03 (×2): qty 1000

## 2014-02-03 MED ORDER — LORAZEPAM 0.5 MG PO TABS
0.5000 mg | ORAL_TABLET | Freq: Every day | ORAL | Status: DC
  Administered 2014-02-03: 0.5 mg via ORAL
  Filled 2014-02-03: qty 1

## 2014-02-03 MED ORDER — LACTATED RINGERS IV SOLN
INTRAVENOUS | Status: DC
  Administered 2014-02-03: 14:00:00 via INTRAVENOUS

## 2014-02-03 MED ORDER — ONDANSETRON HCL 4 MG/2ML IJ SOLN: 4.0000 mg | Freq: Four times a day (QID) | INTRAMUSCULAR | Status: DC | PRN

## 2014-02-03 SURGICAL SUPPLY — 44 items
BINDER BREAST LRG (GAUZE/BANDAGES/DRESSINGS) ×3 IMPLANT
CANISTER SUCTION 2500CC (MISCELLANEOUS) ×3 IMPLANT
CHLORAPREP W/TINT 26ML (MISCELLANEOUS) ×3 IMPLANT
CLIP TI MEDIUM 6 (CLIP) ×3 IMPLANT
COVER SURGICAL LIGHT HANDLE (MISCELLANEOUS) ×3 IMPLANT
DEVICE DUBIN SPECIMEN MAMMOGRA (MISCELLANEOUS) ×3 IMPLANT
DRAPE CHEST BREAST 15X10 FENES (DRAPES) ×3 IMPLANT
DRAPE UTILITY XL STRL (DRAPES) ×6 IMPLANT
ELECT COATED BLADE 2.86 ST (ELECTRODE) ×3 IMPLANT
ELECT REM PT RETURN 9FT ADLT (ELECTROSURGICAL) ×3
ELECTRODE REM PT RTRN 9FT ADLT (ELECTROSURGICAL) ×1 IMPLANT
GLOVE BIOGEL PI IND STRL 6.5 (GLOVE) ×1 IMPLANT
GLOVE BIOGEL PI IND STRL 7.0 (GLOVE) ×2 IMPLANT
GLOVE BIOGEL PI IND STRL 8 (GLOVE) ×1 IMPLANT
GLOVE BIOGEL PI INDICATOR 6.5 (GLOVE) ×2
GLOVE BIOGEL PI INDICATOR 7.0 (GLOVE) ×4
GLOVE BIOGEL PI INDICATOR 8 (GLOVE) ×2
GLOVE SS BIOGEL STRL SZ 7.5 (GLOVE) ×2 IMPLANT
GLOVE SUPERSENSE BIOGEL SZ 7.5 (GLOVE) ×4
GLOVE SURG SS PI 7.0 STRL IVOR (GLOVE) ×3 IMPLANT
GOWN STRL REUS W/ TWL LRG LVL3 (GOWN DISPOSABLE) ×1 IMPLANT
GOWN STRL REUS W/ TWL XL LVL3 (GOWN DISPOSABLE) ×1 IMPLANT
GOWN STRL REUS W/TWL LRG LVL3 (GOWN DISPOSABLE) ×2
GOWN STRL REUS W/TWL XL LVL3 (GOWN DISPOSABLE) ×2
KIT BASIN OR (CUSTOM PROCEDURE TRAY) ×3 IMPLANT
KIT MARKER MARGIN INK (KITS) ×3 IMPLANT
KIT ROOM TURNOVER OR (KITS) ×3 IMPLANT
LIQUID BAND (GAUZE/BANDAGES/DRESSINGS) ×3 IMPLANT
NEEDLE HYPO 25X1 1.5 SAFETY (NEEDLE) ×3 IMPLANT
NS IRRIG 1000ML POUR BTL (IV SOLUTION) ×3 IMPLANT
PACK SURGICAL SETUP 50X90 (CUSTOM PROCEDURE TRAY) ×3 IMPLANT
PAD ARMBOARD 7.5X6 YLW CONV (MISCELLANEOUS) ×3 IMPLANT
PENCIL BUTTON HOLSTER BLD 10FT (ELECTRODE) ×3 IMPLANT
SPECIMEN JAR MEDIUM (MISCELLANEOUS) ×3 IMPLANT
SPONGE LAP 18X18 X RAY DECT (DISPOSABLE) ×3 IMPLANT
SUT MON AB 5-0 PS2 18 (SUTURE) ×3 IMPLANT
SUT VIC AB 3-0 SH 18 (SUTURE) ×3 IMPLANT
SYR BULB 3OZ (MISCELLANEOUS) ×3 IMPLANT
SYR CONTROL 10ML LL (SYRINGE) ×3 IMPLANT
TOWEL OR 17X24 6PK STRL BLUE (TOWEL DISPOSABLE) ×3 IMPLANT
TOWEL OR 17X26 10 PK STRL BLUE (TOWEL DISPOSABLE) ×3 IMPLANT
TUBE CONNECTING 12'X1/4 (SUCTIONS) ×1
TUBE CONNECTING 12X1/4 (SUCTIONS) ×2 IMPLANT
YANKAUER SUCT BULB TIP NO VENT (SUCTIONS) ×3 IMPLANT

## 2014-02-03 NOTE — Op Note (Signed)
Preoperative Diagnosis: bilateral breast cancer  Postoprative Diagnosis: bilateral breast cancer  Procedure: Procedure(s): BILATERAL BREAST LUMPECTOMY WITH NEEDLE LOCALIZATION ON LEFT   Surgeon: Excell Seltzer T   Assistants: None  Anesthesia:  General LMA anesthesia  Indications: Patient is a 81 year old female who recently presented with new onset of a left breast mass. Subsequent imaging and ultrasound guided biopsy has revealed bilateral invasive ductal carcinoma with the tumor measuring about 2.4 cm on the right side and easily palpable in the inferior breast with skin dimpling and approximately 1.5 cm on the left which was not palpable. After extensive preoperative discussion regarding treatment options with the patient and her family we have elected to proceed with bilateral lumpectomy with needle localization on the left side. Following accurate needle localization at the breast center the patient is brought to the operating room for these procedures.   Procedure Detail:  Patient was brought to the operating room, placed in the supine position on the operating table and laryngeal mask general anesthesia induced.  The entire chest was widely sterilely prepped and draped. She received preoperative IV antibiotics. PAS were in place. Patient timeout was performed and correct procedures verified. The left side was approached initially. A curvilinear incision was made in the upper left breast at the estimated site of the tumor and dissection carried down into the subcutaneous tissue.  The dissection was widened out in all directions and the wire brought into the incision. At this point there was a palpable mass along the wire tract.  Using cautery a generous lumpectomy was performed around the shaft and the tip of the wire keeping the palpable area in the center of the specimen. This was excised and oriented with ink. Specimen mammography showed the clip and the mass and the intact wire within  the center of the specimen. This was sent for permanent pathology. Soft tissue was infiltrated with Marcaine and the wound irrigated and complete hemostasis obtained. The deep subcutaneous tissue was closed with interrupted 3-0 Vicryl. Attention was then turned to the right side. The palpable mass was at the 6:00 position with skin dimpling. I elliptically excised a portion of skin over the dimpling and dissection was then widened out in all directions and using cautery generous lumpectomy was performed around the palpable area which was carried down to the chest wall and fascia excised. This was oriented and sent for permanent pathology. Complete hemostasis was obtained after infiltrating the soft tissue with local anesthesia. The deep and subcutaneous tissue was again closed with interrupted 3-0 Vicryl. Both skin incisions were closed with subcuticular 4-0 Monocryl and Dermabond. Sponge and needle counts were correct.    Findings: As above  Estimated Blood Loss:  Minimal         Drains: none  Blood Given: none          Specimens: #1 left breast lumpectomy oriented with ink       #2 right breast lumpectomy with skin and oriented with sutures        Complications:  * No complications entered in OR log *         Disposition: PACU - hemodynamically stable.         Condition: stable

## 2014-02-03 NOTE — Anesthesia Postprocedure Evaluation (Signed)
  Anesthesia Post-op Note  Patient: Jo [name]  Procedure(s) Performed: Procedure(s): BILATERAL BREAST LUMPECTOMY WITH NEEDLE LOCALIZATION ON LEFT (Bilateral)  Patient Location: PACU  Anesthesia Type:General  Level of Consciousness: awake and alert   Airway and Oxygen Therapy: Patient Spontanous Breathing  Post-op Pain: mild  Post-op Assessment: Post-op Vital signs reviewed, Patient's Cardiovascular Status Stable and Respiratory Function Stable  Post-op Vital Signs: Reviewed  Filed Vitals:   02/03/14 1715  BP: 165/64  Pulse: 59  Temp:   Resp: 13    Complications: No apparent anesthesia complications

## 2014-02-03 NOTE — Interval H&P Note (Signed)
History and Physical Interval Note:  02/03/2014 2:19 PM  Jo [name]  has presented today for surgery, with the diagnosis of bilateral breast cancer  The various methods of treatment have been discussed with the patient and family. After consideration of risks, benefits and other options for treatment, the patient has consented to  Procedure(s): BILATERAL BREAST LUMPECTOMY WITH NEEDLE LOCALIZATION ON LEFT (Bilateral) as a surgical intervention .  The patient's history has been reviewed, patient examined, no change in status, stable for surgery.  I have reviewed the patient's chart and labs.  Questions were answered to the patient's satisfaction.     Eber Ferrufino T

## 2014-02-03 NOTE — Progress Notes (Signed)
Dr. Fitzgerald notified of BP 

## 2014-02-03 NOTE — Transfer of Care (Signed)
Immediate Anesthesia Transfer of Care Note  Patient: Jo [name]  Procedure(s) Performed: Procedure(s): BILATERAL BREAST LUMPECTOMY WITH NEEDLE LOCALIZATION ON LEFT (Bilateral)  Patient Location: PACU  Anesthesia Type:General  Level of Consciousness: awake, alert , oriented and patient cooperative  Airway & Oxygen Therapy: Patient Spontanous Breathing and Patient connected to nasal cannula oxygen  Post-op Assessment: Report given to PACU RN and Post -op Vital signs reviewed and stable  Post vital signs: Reviewed and stable  Complications: No apparent anesthesia complications

## 2014-02-03 NOTE — H&P (Signed)
History of Present Illness Jo [name] T. Therron Sells MD; 01/10/2014 3:17 PM) Patient words: breast eval.  The patient is a 81 year old female who presents with breast cancer. Patient is a 81 year old post menopausal female referred by Dr. Conchita Paris for evaluation of recently diagnosed bilateral carcinoma of the breast. She was able to feel a lump in the inferior right breast approximately 1 month ago and was referred to the breast center.. Subsequent imaging included diagnostic mamogram showing a spiculated mass in the inferior right breast at the 5:30 position measuring 2.3 cm with overlying skin retraction. Additionally found was an irregular mass within the left breast at the 11:30 position measuring 1.5 cm. Ultrasound showed correlating masses measuring 2 cm on the right and 1.5 cm on the left normal axillary contents. An ultrasound guided breast biopsy was performed on January 06, 2014 with pathology revealing bilateral invasive ductal carcinoma of the breast. She is seen now in the office for initial treatment planning. She has experienced a lump in the right breast but no other symptoms.. She does not have a personal history of any previous breast problems other than a cyst removed from the right breast many years ago. She is accompanied today by her daughter. The patient lives alone with family assistance but is mobile and independent. Findings at that time were the following: Tumor size: 2.4 cm on the right and 1.5 cm on the left Tumor grade: 1-2 Estrogen Receptor: Positive Progesterone Receptor: Positive Her-2 neu: Negative Lymph node status: Negative   Other Problems Davy Pique Bynum, CMA; 01/10/2014 2:34 PM) Anxiety Disorder Arthritis Atrial Fibrillation Diabetes Mellitus Hypercholesterolemia  Past Surgical History Marjean Donna, [place]; 01/10/2014 2:34 PM) Breast Biopsy Left. Cataract Surgery Bilateral. Cesarean Section - 1  Diagnostic Studies History Marjean Donna, CMA;  01/10/2014 2:34 PM) Colonoscopy never Mammogram 1-3 years ago Pap Smear >5 years ago  Allergies Marjean Donna, CMA; 01/10/2014 2:11 PM) Sulfa Antibiotics  Medication History (Sonya Bynum, CMA; 01/10/2014 2:12 PM) LORazepam (0.5MG Tablet, Oral) Active. Carvedilol (3.125MG Tablet, Oral) Active. Crestor (5MG Tablet, Oral) Active. Diltiazem HCl ER Coated Beads (240MG Capsule ER 24HR, Oral) Active. Sertraline HCl (50MG Tablet, Oral) Active. Vitamin D (Ergocalciferol) (50000UNIT Capsule, Oral) Active. Flecainide Acetate (50MG Tablet, Oral) Active. Eliquis (2.5MG Tablet, Oral) Active.  Social History ([place]; 01/10/2014 2:34 PM) No alcohol use No caffeine use No drug use Tobacco use Never smoker.  Family History Marjean Donna, [place]; 01/10/2014 2:34 PM) Alcohol Abuse Brother. Respiratory Condition Father.  Pregnancy / Birth History Marjean Donna, [place]; 01/10/2014 2:34 PM) Age at menarche 62 years. Gravida 3 Maternal age 70-25 Para 2  Review of Systems ([place]; 01/10/2014 2:34 PM) General Not Present- Appetite Loss, Chills, Fatigue, Fever, Night Sweats, Weight Gain and Weight Loss. Skin Not Present- Change in Wart/Mole, Dryness, Hives, Jaundice, New Lesions, Non-Healing Wounds, Rash and Ulcer. HEENT Present- Hearing Loss and Wears glasses/contact lenses. Not Present- Earache, Hoarseness, Nose Bleed, Oral Ulcers, Ringing in the Ears, Seasonal Allergies, Sinus Pain, Sore Throat, Visual Disturbances and Yellow Eyes. Respiratory Present- Chronic Cough. Not Present- Bloody sputum, Difficulty Breathing, Snoring and Wheezing. Breast Present- Breast Mass. Not Present- Breast Pain, Nipple Discharge and Skin Changes. Cardiovascular Present- Palpitations. Not Present- Chest Pain, Difficulty Breathing Lying Down, Leg Cramps, Rapid Heart Rate, Shortness of Breath and Swelling of Extremities. Gastrointestinal Not Present- Abdominal Pain, Bloating, Bloody Stool,  Change in Bowel Habits, Chronic diarrhea, Constipation, Difficulty Swallowing, Excessive gas, Gets full quickly at meals, Hemorrhoids, Indigestion, Nausea, Rectal Pain and  Vomiting. Female Genitourinary Present- Nocturia and Pelvic Pain. Not Present- Frequency, Painful Urination and Urgency. Musculoskeletal Not Present- Back Pain, Joint Pain, Joint Stiffness, Muscle Pain, Muscle Weakness and Swelling of Extremities. Neurological Not Present- Decreased Memory, Fainting, Headaches, Numbness, Seizures, Tingling, Tremor, Trouble walking and Weakness. Psychiatric Present- Anxiety. Not Present- Bipolar, Change in Sleep Pattern, Depression, Fearful and Frequent crying. Endocrine Not Present- Cold Intolerance, Excessive Hunger, Hair Changes, Heat Intolerance, Hot flashes and New Diabetes. Hematology Present- Easy Bruising. Not Present- Excessive bleeding, Gland problems, HIV and Persistent Infections.   Vitals (Sonya Bynum CMA; 01/10/2014 2:34 PM) 01/10/2014 2:34 PM Weight: 130 lb Height: 62in Body Surface Area: 1.61 m Body Mass Index: 23.78 kg/m Temp.: 97.16F(Temporal)  Pulse: 71 (Regular)  BP: 128/72 (Sitting, Left Arm, Standard)    Physical Exam Jo [name] T. Doyl Bitting MD; 01/10/2014 3:18 PM) The physical exam findings are as follows: Note:General: Alert, elderly Caucasian female appearing somewhat younger than her stated age, ambulatory with a walker and able to get on the exam table without difficulty, in no distress Skin: Warm and dry without rash or infection. HEENT: No palpable masses or thyromegaly. Sclera nonicteric. Pupils equal round and reactive. Oropharynx clear. Lymph nodes: No cervical, supraclavicular, or inguinal nodes palpable. Breasts: Approximately 2.5 cm mass palpable in the inferior right breast with an area of skin dimpling. There is bruising and some swelling in the upper left breast. No other abnormalities Lungs: Breath sounds clear and equal. No wheezing or  increased work of breathing. Cardiovascular: Regular rate and irregular rhythm without murmer. No JVD or edema. Pacemaker present left chest wall Abdomen: Nondistended. Soft and nontender. No masses palpable. No organomegaly. No palpable hernias. Extremities: No edema or joint swelling or deformity. No chronic venous stasis changes. Neurologic: Alert and fully oriented. Gait normal. No focal weakness. Somewhat hard of hearing Psychiatric: Normal mood and affect. Thought content appropriate with normal judgement and insight    Assessment & Plan Jo [name] T. Jarett Dralle MD; 01/10/2014 3:22 PM) BREAST CANCER, FEMALE (174.9  C50.919) Impression: 81 year old female with new diagnosis of stage IA low-grade, ER/PR positive HER-2/neu negative bilateral breast cancer. I discussed treatment options in detail with the patient and her daughter today. We discussed that obviously at age 58 we do not typically pursue the most aggressive forms of treatment. She is in quite good shape for her age and could live a number of years. I think that no treatment is not a good choice. I discussed options of bilateral lumpectomy either alone or in combination with adjuvant hormonal treatment or hormonal treatment alone if she does not want surgery. I discussed that lumpectomy would produce some significant deformity in the right breast due to the location and size of the tumor but this is not an issue for her. I think this would be effective and somewhat safer than bilateral mastectomy in light of her chronic anticoagulation. We discussed in general terms indications for hormone treatment and potential side effects. All their questions were answered and she would like to think this over for a few days and she will get back to me regarding her decision. If she does not want surgery we will refer to the cancer center to speak to a medical oncologist about hormonal therapy Current Plans   We discussed options of bilateral  lumpectomy or hormone treatment. Patient has subsequently decided on bilateral lumpectomy  Edward Jolly MD, FACS  02/03/2014, 10:42 AM

## 2014-02-03 NOTE — Anesthesia Preprocedure Evaluation (Signed)
Anesthesia Evaluation  Patient identified by MRN, date of birth, ID band Patient awake    Reviewed: Allergy & Precautions, H&P , NPO status , Patient's Chart, lab work & pertinent test results, reviewed documented beta blocker date and time   Airway Mallampati: II  TM Distance: >3 FB Neck ROM: Full    Dental no notable dental hx. (+) Teeth Intact, Dental Advisory Given   Pulmonary neg pulmonary ROS,  breath sounds clear to auscultation  Pulmonary exam normal       Cardiovascular hypertension, Pt. on medications and Pt. on home beta blockers +CHF + dysrhythmias Atrial Fibrillation + pacemaker Rhythm:Regular Rate:Normal     Neuro/Psych Anxiety Depression negative neurological ROS     GI/Hepatic negative GI ROS, Neg liver ROS,   Endo/Other  diabetes  Renal/GU Renal InsufficiencyRenal disease  negative genitourinary   Musculoskeletal  (+) Arthritis -, Osteoarthritis,    Abdominal   Peds  Hematology negative hematology ROS (+)   Anesthesia Other Findings   Reproductive/Obstetrics negative OB ROS                             Anesthesia Physical Anesthesia Plan  ASA: III  Anesthesia Plan: General   Post-op Pain Management:    Induction: Intravenous  Airway Management Planned: LMA  Additional Equipment:   Intra-op Plan:   Post-operative Plan: Extubation in OR  Informed Consent: I have reviewed the patients History and Physical, chart, labs and discussed the procedure including the risks, benefits and alternatives for the proposed anesthesia with the patient or authorized representative who has indicated his/her understanding and acceptance.   Dental advisory given  Plan Discussed with: CRNA  Anesthesia Plan Comments:         Anesthesia Quick Evaluation

## 2014-02-04 ENCOUNTER — Encounter (HOSPITAL_COMMUNITY): Payer: Self-pay | Admitting: General Surgery

## 2014-02-04 DIAGNOSIS — E119 Type 2 diabetes mellitus without complications: Secondary | ICD-10-CM | POA: Diagnosis not present

## 2014-02-04 DIAGNOSIS — F419 Anxiety disorder, unspecified: Secondary | ICD-10-CM | POA: Diagnosis not present

## 2014-02-04 DIAGNOSIS — C50911 Malignant neoplasm of unspecified site of right female breast: Secondary | ICD-10-CM | POA: Diagnosis not present

## 2014-02-04 DIAGNOSIS — M1990 Unspecified osteoarthritis, unspecified site: Secondary | ICD-10-CM | POA: Diagnosis not present

## 2014-02-04 DIAGNOSIS — C50912 Malignant neoplasm of unspecified site of left female breast: Secondary | ICD-10-CM | POA: Diagnosis not present

## 2014-02-04 DIAGNOSIS — I4891 Unspecified atrial fibrillation: Secondary | ICD-10-CM | POA: Diagnosis not present

## 2014-02-04 LAB — CBC
HCT: 39.4 % (ref 36.0–46.0)
Hemoglobin: 13.3 g/dL (ref 12.0–15.0)
MCH: 31.8 pg (ref 26.0–34.0)
MCHC: 33.8 g/dL (ref 30.0–36.0)
MCV: 94.3 fL (ref 78.0–100.0)
Platelets: 182 10*3/uL (ref 150–400)
RBC: 4.18 MIL/uL (ref 3.87–5.11)
RDW: 12.9 % (ref 11.5–15.5)
WBC: 13.8 10*3/uL — ABNORMAL HIGH (ref 4.0–10.5)

## 2014-02-04 LAB — BASIC METABOLIC PANEL
Anion gap: 3 — ABNORMAL LOW (ref 5–15)
BUN: 15 mg/dL (ref 6–23)
CHLORIDE: 109 meq/L (ref 96–112)
CO2: 23 mmol/L (ref 19–32)
Calcium: 8.1 mg/dL — ABNORMAL LOW (ref 8.4–10.5)
Creatinine, Ser: 0.66 mg/dL (ref 0.50–1.10)
GFR, EST AFRICAN AMERICAN: 85 mL/min — AB (ref >90)
GFR, EST NON AFRICAN AMERICAN: 73 mL/min — AB (ref >90)
Glucose, Bld: 287 mg/dL — ABNORMAL HIGH (ref 70–99)
POTASSIUM: 4.5 mmol/L (ref 3.5–5.1)
SODIUM: 135 mmol/L (ref 135–145)

## 2014-02-04 MED ORDER — HYDROCODONE-ACETAMINOPHEN 5-325 MG PO TABS: 1.0000 | ORAL_TABLET | ORAL | Status: DC | PRN

## 2014-02-04 NOTE — Discharge Summary (Signed)
   Patient ID: Jo [name] [id number] 81 y.o. 1917-05-08  02/03/2014  Discharge date and time: 02/04/2014   Admitting Physician: Excell Seltzer T  Discharge Physician: Excell Seltzer T  Admission Diagnoses: bilateral breast cancer  Discharge Diagnoses: Same  Operations: Procedure(s): BILATERAL BREAST LUMPECTOMY WITH NEEDLE LOCALIZATION ON LEFT  Admission Condition: good  Discharged Condition: good  Indication for Admission: Observation p bilateral lumpectomy for Ca breast  Hospital Course: Uneventful surgery and no problems post op.  Wounds clean and dry, pt without C/O  Disposition: Home  Patient Instructions:    Medication List    STOP taking these medications        acetaminophen 325 MG tablet  Commonly known as:  TYLENOL     carvedilol 3.125 MG tablet  Commonly known as:  COREG     diltiazem 240 MG 24 hr capsule  Commonly known as:  CARDIZEM CD     estradiol 0.1 MG/GM vaginal cream  Commonly known as:  ESTRACE     flecainide 50 MG tablet  Commonly known as:  TAMBOCOR     LORazepam 0.5 MG tablet  Commonly known as:  ATIVAN     rosuvastatin 5 MG tablet  Commonly known as:  CRESTOR     sertraline 50 MG tablet  Commonly known as:  ZOLOFT      TAKE these medications        apixaban 2.5 MG Tabs tablet  Commonly known as:  ELIQUIS  Take 1 tablet (2.5 mg total) by mouth 2 (two) times daily.     CALCIUM 600 PO  Take 600 mg by mouth daily.     HYDROcodone-acetaminophen 5-325 MG per tablet  Commonly known as:  NORCO/VICODIN  Take 1-2 tablets by mouth every 4 (four) hours as needed for moderate pain.     Vitamin D (Ergocalciferol) 50000 UNITS Caps capsule  Commonly known as:  DRISDOL  Take 50,000 Units by mouth every Wednesday.        Activity: activity as tolerated Diet: regular diet Wound Care: none needed  Follow-up:  With Dr Excell Seltzer in 3 weeks.  Signed: Edward Jolly MD, FACS  02/04/2014, 7:46 AM

## 2014-02-04 NOTE — Progress Notes (Signed)
Discharge information reviewed with patient and pt's son. Reviewed all medications and follow up appointment information along with when to call the doctor. Appropriate questions were asked. Pt will be going home after lunch.

## 2014-02-04 NOTE — Discharge Instructions (Signed)
[facility] °Office Phone Number [phone] ° °BREAST BIOPSY/ PARTIAL MASTECTOMY: POST OP INSTRUCTIONS ° °Always review your discharge instruction sheet given to you by the facility where your surgery was performed. ° °IF YOU HAVE DISABILITY OR FAMILY LEAVE FORMS, YOU MUST BRING THEM TO THE OFFICE FOR PROCESSING.  DO NOT GIVE THEM TO YOUR DOCTOR. ° °1. A prescription for pain medication Hart[place]orne be given to you upon discharge.  Take your pain medication as prescribed, if needed.  If narcotic pain medicine is not needed, then you Doukas take acetaminophen (Tylenol) or ibuprofen (Advil) as needed. °2. Take your usually prescribed medications unless otherwise directed °3. If you need a refill on your pain medication, please contact your pharmacy.  They will contact our office to request authorization.  Prescriptions will not be filled after 5pm or on week-ends. °4. You should eat very light the first 24 hours after surgery, such as soup, crackers, pudding, etc.  Resume your normal diet the day after surgery. °5. Most patients will experience some swelling and bruising in the breast.  Ice packs and a good support bra will help.  Swelling and bruising can take several days to resolve.  °6. It is common to experience some constipation if taking pain medication after surgery.  Increasing fluid intake and taking a stool softener will usually help or prevent this problem from occurring.  A mild laxative (Milk of Magnesia or Miralax) should be taken according to package directions if there are no bowel movements after 48 hours. °7. Unless discharge instructions indicate otherwise, you Kroft remove your bandages 24-48 hours after surgery, and you Stejskal shower at that time.  You [place] have steri-strips (small skin tapes) in place directly over the incision.  These strips should be left on the skin for 7-10 days.  If your surgeon used skin glue on the incision, you Cooks shower in 24 hours.  The glue will flake off over the  next 2-3 weeks.  Any sutures or staples will be removed at the office during your follow-up visit. °8. ACTIVITIES:  You [place] resume regular daily activities (gradually increasing) beginning the next day.  Wearing a good support bra or sports bra minimizes pain and swelling.  You Coluccio have sexual intercourse when it is comfortable. °a. You Londo drive when you no longer are taking prescription pain medication, you can comfortably wear a seatbelt, and you can safely maneuver your car and apply brakes. °b. RETURN TO WORK:  ______________________________________________________________________________________ °9. You should see your doctor in the office for a follow-up appointment approximately two weeks after your surgery.  Your doctor’s nurse will typically make your follow-up appointment when she calls you with your pathology report.  Expect your pathology report 2-3 business days after your surgery.  You Magallon call to check if you do not hear from us after three days. °10. OTHER INSTRUCTIONS: _______________________________________________________________________________________________ _____________________________________________________________________________________________________________________________________ °_____________________________________________________________________________________________________________________________________ °_____________________________________________________________________________________________________________________________________ ° °WHEN TO CALL YOUR DOCTOR: °1. Fever over 101.0 °2. Nausea and/or vomiting. °3. Extreme swelling or bruising. °4. Continued bleeding from incision. °5. Increased pain, redness, or drainage from the incision. ° °The clinic staff is available to answer your questions during regular business hours.  Please don’t hesitate to call and ask to speak to one of the nurses for clinical concerns.  If you have a medical emergency, go to the nearest  emergency room or call 911.  A surgeon from [facility] is always on call at the hospital. ° °For further questions, please visit [email or web address]  °

## 2014-03-24 ENCOUNTER — Ambulatory Visit (INDEPENDENT_AMBULATORY_CARE_PROVIDER_SITE_OTHER): Payer: Medicare Other | Admitting: *Deleted

## 2014-03-24 DIAGNOSIS — I495 Sick sinus syndrome: Secondary | ICD-10-CM

## 2014-03-24 LAB — MDC_IDC_ENUM_SESS_TYPE_REMOTE
Brady Statistic AP VP Percent: 0 %
Brady Statistic AP VS Percent: 96 %
Brady Statistic AS VS Percent: 4 %
Lead Channel Impedance Value: 426 Ohm
Lead Channel Pacing Threshold Amplitude: 0.5 V
Lead Channel Pacing Threshold Amplitude: 1 V
Lead Channel Pacing Threshold Pulse Width: 0.4 ms
Lead Channel Pacing Threshold Pulse Width: 0.4 ms
Lead Channel Sensing Intrinsic Amplitude: 5.6 mV
Lead Channel Setting Pacing Amplitude: 2 V
Lead Channel Setting Pacing Amplitude: 2.5 V
Lead Channel Setting Pacing Pulse Width: 0.4 ms
MDC IDC MSMT BATTERY IMPEDANCE: 135 Ohm
MDC IDC MSMT BATTERY REMAINING LONGEVITY: 135 mo
MDC IDC MSMT BATTERY VOLTAGE: 2.8 V
MDC IDC MSMT LEADCHNL RA IMPEDANCE VALUE: 508 Ohm
MDC IDC SESS DTM: 20160229130014
MDC IDC SET LEADCHNL RV SENSING SENSITIVITY: 2.8 mV
MDC IDC STAT BRADY AS VP PERCENT: 0 %

## 2014-03-24 NOTE — Progress Notes (Signed)
Remote pacemaker transmission.   

## 2014-04-01 DIAGNOSIS — E559 Vitamin D deficiency, unspecified: Secondary | ICD-10-CM | POA: Diagnosis not present

## 2014-04-01 DIAGNOSIS — E785 Hyperlipidemia, unspecified: Secondary | ICD-10-CM | POA: Diagnosis not present

## 2014-04-01 DIAGNOSIS — Z008 Encounter for other general examination: Secondary | ICD-10-CM | POA: Diagnosis not present

## 2014-04-01 DIAGNOSIS — N390 Urinary tract infection, site not specified: Secondary | ICD-10-CM | POA: Diagnosis not present

## 2014-04-01 DIAGNOSIS — E119 Type 2 diabetes mellitus without complications: Secondary | ICD-10-CM | POA: Diagnosis not present

## 2014-04-07 ENCOUNTER — Encounter: Payer: Self-pay | Admitting: Cardiology

## 2014-04-08 DIAGNOSIS — L89309 Pressure ulcer of unspecified buttock, unspecified stage: Secondary | ICD-10-CM | POA: Diagnosis not present

## 2014-04-08 DIAGNOSIS — H3530 Unspecified macular degeneration: Secondary | ICD-10-CM | POA: Diagnosis not present

## 2014-04-08 DIAGNOSIS — Z1389 Encounter for screening for other disorder: Secondary | ICD-10-CM | POA: Diagnosis not present

## 2014-04-08 DIAGNOSIS — I10 Essential (primary) hypertension: Secondary | ICD-10-CM | POA: Diagnosis not present

## 2014-04-08 DIAGNOSIS — R8299 Other abnormal findings in urine: Secondary | ICD-10-CM | POA: Diagnosis not present

## 2014-04-08 DIAGNOSIS — Z1231 Encounter for screening mammogram for malignant neoplasm of breast: Secondary | ICD-10-CM | POA: Diagnosis not present

## 2014-04-08 DIAGNOSIS — I480 Paroxysmal atrial fibrillation: Secondary | ICD-10-CM | POA: Diagnosis not present

## 2014-04-08 DIAGNOSIS — E785 Hyperlipidemia, unspecified: Secondary | ICD-10-CM | POA: Diagnosis not present

## 2014-04-08 DIAGNOSIS — M1990 Unspecified osteoarthritis, unspecified site: Secondary | ICD-10-CM | POA: Diagnosis not present

## 2014-04-08 DIAGNOSIS — Z6823 Body mass index (BMI) 23.0-23.9, adult: Secondary | ICD-10-CM | POA: Diagnosis not present

## 2014-04-08 DIAGNOSIS — R809 Proteinuria, unspecified: Secondary | ICD-10-CM | POA: Diagnosis not present

## 2014-04-08 DIAGNOSIS — Z0000 Encounter for general adult medical examination without abnormal findings: Secondary | ICD-10-CM | POA: Diagnosis not present

## 2014-04-09 ENCOUNTER — Encounter: Payer: Self-pay | Admitting: Cardiology

## 2014-04-10 ENCOUNTER — Encounter: Payer: Self-pay | Admitting: Internal Medicine

## 2014-04-24 DIAGNOSIS — Z1212 Encounter for screening for malignant neoplasm of rectum: Secondary | ICD-10-CM | POA: Diagnosis not present

## 2014-06-24 ENCOUNTER — Ambulatory Visit (INDEPENDENT_AMBULATORY_CARE_PROVIDER_SITE_OTHER): Payer: Medicare Other | Admitting: *Deleted

## 2014-06-24 DIAGNOSIS — I495 Sick sinus syndrome: Secondary | ICD-10-CM | POA: Diagnosis not present

## 2014-06-24 NOTE — Progress Notes (Signed)
Remote pacemaker transmission.   

## 2014-06-29 LAB — CUP PACEART REMOTE DEVICE CHECK
Battery Impedance: 135 Ohm
Battery Remaining Longevity: 135 mo
Brady Statistic AP VP Percent: 0 %
Brady Statistic AP VS Percent: 95 %
Brady Statistic AS VP Percent: 0 %
Brady Statistic AS VS Percent: 5 %
Lead Channel Impedance Value: 418 Ohm
Lead Channel Impedance Value: 500 Ohm
Lead Channel Pacing Threshold Amplitude: 0.5 V
Lead Channel Pacing Threshold Pulse Width: 0.4 ms
Lead Channel Pacing Threshold Pulse Width: 0.4 ms
Lead Channel Setting Pacing Amplitude: 2 V
Lead Channel Setting Pacing Pulse Width: 0.4 ms
MDC IDC MSMT BATTERY VOLTAGE: 2.8 V
MDC IDC MSMT LEADCHNL RV PACING THRESHOLD AMPLITUDE: 1 V
MDC IDC MSMT LEADCHNL RV SENSING INTR AMPL: 5.6 mV — AB
MDC IDC SESS DTM: 20160531145155
MDC IDC SET LEADCHNL RV PACING AMPLITUDE: 2.5 V
MDC IDC SET LEADCHNL RV SENSING SENSITIVITY: 2.8 mV

## 2014-07-14 ENCOUNTER — Encounter: Payer: Self-pay | Admitting: Cardiology

## 2014-07-16 ENCOUNTER — Encounter: Payer: Self-pay | Admitting: Internal Medicine

## 2014-07-18 DIAGNOSIS — M6281 Muscle weakness (generalized): Secondary | ICD-10-CM | POA: Diagnosis not present

## 2014-07-18 DIAGNOSIS — Z7901 Long term (current) use of anticoagulants: Secondary | ICD-10-CM | POA: Diagnosis not present

## 2014-07-18 DIAGNOSIS — E119 Type 2 diabetes mellitus without complications: Secondary | ICD-10-CM | POA: Diagnosis not present

## 2014-07-18 DIAGNOSIS — Z9181 History of falling: Secondary | ICD-10-CM | POA: Diagnosis not present

## 2014-07-18 DIAGNOSIS — Z853 Personal history of malignant neoplasm of breast: Secondary | ICD-10-CM | POA: Diagnosis not present

## 2014-07-18 DIAGNOSIS — I480 Paroxysmal atrial fibrillation: Secondary | ICD-10-CM | POA: Diagnosis not present

## 2014-07-18 DIAGNOSIS — I10 Essential (primary) hypertension: Secondary | ICD-10-CM | POA: Diagnosis not present

## 2014-07-18 DIAGNOSIS — Z950 Presence of cardiac pacemaker: Secondary | ICD-10-CM | POA: Diagnosis not present

## 2014-07-18 DIAGNOSIS — Z87440 Personal history of urinary (tract) infections: Secondary | ICD-10-CM | POA: Diagnosis not present

## 2014-07-18 DIAGNOSIS — M1990 Unspecified osteoarthritis, unspecified site: Secondary | ICD-10-CM | POA: Diagnosis not present

## 2014-07-18 DIAGNOSIS — F410 Panic disorder [episodic paroxysmal anxiety] without agoraphobia: Secondary | ICD-10-CM | POA: Diagnosis not present

## 2014-07-18 DIAGNOSIS — H3530 Unspecified macular degeneration: Secondary | ICD-10-CM | POA: Diagnosis not present

## 2014-07-18 DIAGNOSIS — F418 Other specified anxiety disorders: Secondary | ICD-10-CM | POA: Diagnosis not present

## 2014-07-22 DIAGNOSIS — F418 Other specified anxiety disorders: Secondary | ICD-10-CM | POA: Diagnosis not present

## 2014-07-22 DIAGNOSIS — M1990 Unspecified osteoarthritis, unspecified site: Secondary | ICD-10-CM | POA: Diagnosis not present

## 2014-07-22 DIAGNOSIS — E119 Type 2 diabetes mellitus without complications: Secondary | ICD-10-CM | POA: Diagnosis not present

## 2014-07-22 DIAGNOSIS — I10 Essential (primary) hypertension: Secondary | ICD-10-CM | POA: Diagnosis not present

## 2014-07-22 DIAGNOSIS — M6281 Muscle weakness (generalized): Secondary | ICD-10-CM | POA: Diagnosis not present

## 2014-07-22 DIAGNOSIS — I480 Paroxysmal atrial fibrillation: Secondary | ICD-10-CM | POA: Diagnosis not present

## 2014-07-24 DIAGNOSIS — M1990 Unspecified osteoarthritis, unspecified site: Secondary | ICD-10-CM | POA: Diagnosis not present

## 2014-07-24 DIAGNOSIS — F418 Other specified anxiety disorders: Secondary | ICD-10-CM | POA: Diagnosis not present

## 2014-07-24 DIAGNOSIS — I10 Essential (primary) hypertension: Secondary | ICD-10-CM | POA: Diagnosis not present

## 2014-07-24 DIAGNOSIS — E119 Type 2 diabetes mellitus without complications: Secondary | ICD-10-CM | POA: Diagnosis not present

## 2014-07-24 DIAGNOSIS — M6281 Muscle weakness (generalized): Secondary | ICD-10-CM | POA: Diagnosis not present

## 2014-07-24 DIAGNOSIS — I480 Paroxysmal atrial fibrillation: Secondary | ICD-10-CM | POA: Diagnosis not present

## 2014-07-25 DIAGNOSIS — E119 Type 2 diabetes mellitus without complications: Secondary | ICD-10-CM | POA: Diagnosis not present

## 2014-07-25 DIAGNOSIS — I10 Essential (primary) hypertension: Secondary | ICD-10-CM | POA: Diagnosis not present

## 2014-07-25 DIAGNOSIS — M1990 Unspecified osteoarthritis, unspecified site: Secondary | ICD-10-CM | POA: Diagnosis not present

## 2014-07-25 DIAGNOSIS — F418 Other specified anxiety disorders: Secondary | ICD-10-CM | POA: Diagnosis not present

## 2014-07-25 DIAGNOSIS — I480 Paroxysmal atrial fibrillation: Secondary | ICD-10-CM | POA: Diagnosis not present

## 2014-07-25 DIAGNOSIS — M6281 Muscle weakness (generalized): Secondary | ICD-10-CM | POA: Diagnosis not present

## 2014-07-29 DIAGNOSIS — E119 Type 2 diabetes mellitus without complications: Secondary | ICD-10-CM | POA: Diagnosis not present

## 2014-07-29 DIAGNOSIS — F418 Other specified anxiety disorders: Secondary | ICD-10-CM | POA: Diagnosis not present

## 2014-07-29 DIAGNOSIS — M6281 Muscle weakness (generalized): Secondary | ICD-10-CM | POA: Diagnosis not present

## 2014-07-29 DIAGNOSIS — M1990 Unspecified osteoarthritis, unspecified site: Secondary | ICD-10-CM | POA: Diagnosis not present

## 2014-07-29 DIAGNOSIS — I480 Paroxysmal atrial fibrillation: Secondary | ICD-10-CM | POA: Diagnosis not present

## 2014-07-29 DIAGNOSIS — I10 Essential (primary) hypertension: Secondary | ICD-10-CM | POA: Diagnosis not present

## 2014-07-31 DIAGNOSIS — M1990 Unspecified osteoarthritis, unspecified site: Secondary | ICD-10-CM | POA: Diagnosis not present

## 2014-07-31 DIAGNOSIS — I10 Essential (primary) hypertension: Secondary | ICD-10-CM | POA: Diagnosis not present

## 2014-07-31 DIAGNOSIS — I480 Paroxysmal atrial fibrillation: Secondary | ICD-10-CM | POA: Diagnosis not present

## 2014-07-31 DIAGNOSIS — F418 Other specified anxiety disorders: Secondary | ICD-10-CM | POA: Diagnosis not present

## 2014-07-31 DIAGNOSIS — E119 Type 2 diabetes mellitus without complications: Secondary | ICD-10-CM | POA: Diagnosis not present

## 2014-07-31 DIAGNOSIS — M6281 Muscle weakness (generalized): Secondary | ICD-10-CM | POA: Diagnosis not present

## 2014-08-01 DIAGNOSIS — H02834 Dermatochalasis of left upper eyelid: Secondary | ICD-10-CM | POA: Diagnosis not present

## 2014-08-01 DIAGNOSIS — H02831 Dermatochalasis of right upper eyelid: Secondary | ICD-10-CM | POA: Diagnosis not present

## 2014-08-01 DIAGNOSIS — Z961 Presence of intraocular lens: Secondary | ICD-10-CM | POA: Diagnosis not present

## 2014-08-01 DIAGNOSIS — H3531 Nonexudative age-related macular degeneration: Secondary | ICD-10-CM | POA: Diagnosis not present

## 2014-08-04 DIAGNOSIS — F418 Other specified anxiety disorders: Secondary | ICD-10-CM | POA: Diagnosis not present

## 2014-08-04 DIAGNOSIS — I480 Paroxysmal atrial fibrillation: Secondary | ICD-10-CM | POA: Diagnosis not present

## 2014-08-04 DIAGNOSIS — M1990 Unspecified osteoarthritis, unspecified site: Secondary | ICD-10-CM | POA: Diagnosis not present

## 2014-08-04 DIAGNOSIS — I10 Essential (primary) hypertension: Secondary | ICD-10-CM | POA: Diagnosis not present

## 2014-08-04 DIAGNOSIS — M6281 Muscle weakness (generalized): Secondary | ICD-10-CM | POA: Diagnosis not present

## 2014-08-04 DIAGNOSIS — E119 Type 2 diabetes mellitus without complications: Secondary | ICD-10-CM | POA: Diagnosis not present

## 2014-08-07 DIAGNOSIS — F418 Other specified anxiety disorders: Secondary | ICD-10-CM | POA: Diagnosis not present

## 2014-08-07 DIAGNOSIS — M1990 Unspecified osteoarthritis, unspecified site: Secondary | ICD-10-CM | POA: Diagnosis not present

## 2014-08-07 DIAGNOSIS — E119 Type 2 diabetes mellitus without complications: Secondary | ICD-10-CM | POA: Diagnosis not present

## 2014-08-07 DIAGNOSIS — I480 Paroxysmal atrial fibrillation: Secondary | ICD-10-CM | POA: Diagnosis not present

## 2014-08-07 DIAGNOSIS — I10 Essential (primary) hypertension: Secondary | ICD-10-CM | POA: Diagnosis not present

## 2014-08-07 DIAGNOSIS — M6281 Muscle weakness (generalized): Secondary | ICD-10-CM | POA: Diagnosis not present

## 2014-08-08 DIAGNOSIS — F418 Other specified anxiety disorders: Secondary | ICD-10-CM | POA: Diagnosis not present

## 2014-08-08 DIAGNOSIS — I10 Essential (primary) hypertension: Secondary | ICD-10-CM | POA: Diagnosis not present

## 2014-08-08 DIAGNOSIS — I480 Paroxysmal atrial fibrillation: Secondary | ICD-10-CM | POA: Diagnosis not present

## 2014-08-08 DIAGNOSIS — M1990 Unspecified osteoarthritis, unspecified site: Secondary | ICD-10-CM | POA: Diagnosis not present

## 2014-08-08 DIAGNOSIS — M6281 Muscle weakness (generalized): Secondary | ICD-10-CM | POA: Diagnosis not present

## 2014-08-08 DIAGNOSIS — E119 Type 2 diabetes mellitus without complications: Secondary | ICD-10-CM | POA: Diagnosis not present

## 2014-08-12 DIAGNOSIS — I480 Paroxysmal atrial fibrillation: Secondary | ICD-10-CM | POA: Diagnosis not present

## 2014-08-12 DIAGNOSIS — E119 Type 2 diabetes mellitus without complications: Secondary | ICD-10-CM | POA: Diagnosis not present

## 2014-08-12 DIAGNOSIS — I10 Essential (primary) hypertension: Secondary | ICD-10-CM | POA: Diagnosis not present

## 2014-08-12 DIAGNOSIS — M6281 Muscle weakness (generalized): Secondary | ICD-10-CM | POA: Diagnosis not present

## 2014-08-12 DIAGNOSIS — F418 Other specified anxiety disorders: Secondary | ICD-10-CM | POA: Diagnosis not present

## 2014-08-12 DIAGNOSIS — M1990 Unspecified osteoarthritis, unspecified site: Secondary | ICD-10-CM | POA: Diagnosis not present

## 2014-08-13 DIAGNOSIS — I10 Essential (primary) hypertension: Secondary | ICD-10-CM | POA: Diagnosis not present

## 2014-08-13 DIAGNOSIS — F418 Other specified anxiety disorders: Secondary | ICD-10-CM | POA: Diagnosis not present

## 2014-08-13 DIAGNOSIS — M6281 Muscle weakness (generalized): Secondary | ICD-10-CM | POA: Diagnosis not present

## 2014-08-13 DIAGNOSIS — E119 Type 2 diabetes mellitus without complications: Secondary | ICD-10-CM | POA: Diagnosis not present

## 2014-08-13 DIAGNOSIS — M1990 Unspecified osteoarthritis, unspecified site: Secondary | ICD-10-CM | POA: Diagnosis not present

## 2014-08-13 DIAGNOSIS — I480 Paroxysmal atrial fibrillation: Secondary | ICD-10-CM | POA: Diagnosis not present

## 2014-08-14 DIAGNOSIS — I10 Essential (primary) hypertension: Secondary | ICD-10-CM | POA: Diagnosis not present

## 2014-08-14 DIAGNOSIS — M6281 Muscle weakness (generalized): Secondary | ICD-10-CM | POA: Diagnosis not present

## 2014-08-14 DIAGNOSIS — I480 Paroxysmal atrial fibrillation: Secondary | ICD-10-CM | POA: Diagnosis not present

## 2014-08-14 DIAGNOSIS — F418 Other specified anxiety disorders: Secondary | ICD-10-CM | POA: Diagnosis not present

## 2014-08-14 DIAGNOSIS — M1990 Unspecified osteoarthritis, unspecified site: Secondary | ICD-10-CM | POA: Diagnosis not present

## 2014-08-14 DIAGNOSIS — E119 Type 2 diabetes mellitus without complications: Secondary | ICD-10-CM | POA: Diagnosis not present

## 2014-08-22 DIAGNOSIS — C50919 Malignant neoplasm of unspecified site of unspecified female breast: Secondary | ICD-10-CM | POA: Diagnosis not present

## 2014-09-08 DIAGNOSIS — Z6822 Body mass index (BMI) 22.0-22.9, adult: Secondary | ICD-10-CM | POA: Diagnosis not present

## 2014-09-08 DIAGNOSIS — M810 Age-related osteoporosis without current pathological fracture: Secondary | ICD-10-CM | POA: Diagnosis not present

## 2014-09-08 DIAGNOSIS — I480 Paroxysmal atrial fibrillation: Secondary | ICD-10-CM | POA: Diagnosis not present

## 2014-09-08 DIAGNOSIS — E119 Type 2 diabetes mellitus without complications: Secondary | ICD-10-CM | POA: Diagnosis not present

## 2014-09-08 DIAGNOSIS — Z950 Presence of cardiac pacemaker: Secondary | ICD-10-CM | POA: Diagnosis not present

## 2014-09-08 DIAGNOSIS — I10 Essential (primary) hypertension: Secondary | ICD-10-CM | POA: Diagnosis not present

## 2014-09-16 ENCOUNTER — Other Ambulatory Visit: Payer: Self-pay | Admitting: *Deleted

## 2014-09-17 ENCOUNTER — Telehealth: Payer: Self-pay | Admitting: Cardiology

## 2014-09-17 ENCOUNTER — Telehealth: Payer: Self-pay | Admitting: Internal Medicine

## 2014-09-17 NOTE — Telephone Encounter (Signed)
Call the pt's daughter, [name] to inform her, per Claiborne Billings, RN, Dr. Jackalyn Lombard nurse, to continue the pt on Flecainide 50 mg, until the pt comes to see Dr. Rayann Heman on 09/22/14 and that the doctor would discuss the issue than. Pt's daughter verbalized understanding.

## 2014-09-17 NOTE — Telephone Encounter (Signed)
Called pt's daughter Jo [name] to discuss pt's medication Flecainide 50 mg tablet. I explained to the daughter that when that pt was in the hospital for surgery on 02/04/14, the discharge summary stated that the pt stop taking some medications and Flecainide 50 mg was one of them, but the pharmacy [facility] is requesting a refill on this medication. Jo [name], the pt's daughter stated that the pt has an appointment with Dr. Rayann Heman on 09/22/14 and that she would advise the pt to stop talking the medication until the pt sees Dr. Rayann Heman on 09/22/14. I advised the daughter that if she or the pt have any other questions or concerns to call the office. Daughter verbalized understanding.

## 2014-09-22 ENCOUNTER — Encounter: Payer: Self-pay | Admitting: Internal Medicine

## 2014-09-22 ENCOUNTER — Ambulatory Visit (INDEPENDENT_AMBULATORY_CARE_PROVIDER_SITE_OTHER): Payer: Medicare Other | Admitting: Internal Medicine

## 2014-09-22 VITALS — BP 170/74 | HR 83 | Ht 62.0 in | Wt 126.6 lb

## 2014-09-22 DIAGNOSIS — I480 Paroxysmal atrial fibrillation: Secondary | ICD-10-CM | POA: Diagnosis not present

## 2014-09-22 DIAGNOSIS — I10 Essential (primary) hypertension: Secondary | ICD-10-CM | POA: Diagnosis not present

## 2014-09-22 DIAGNOSIS — I4891 Unspecified atrial fibrillation: Secondary | ICD-10-CM

## 2014-09-22 DIAGNOSIS — I495 Sick sinus syndrome: Secondary | ICD-10-CM | POA: Diagnosis not present

## 2014-09-22 LAB — CUP PACEART INCLINIC DEVICE CHECK
Battery Remaining Longevity: 129 mo
Brady Statistic AP VP Percent: 0 %
Brady Statistic AP VS Percent: 96 %
Brady Statistic AS VP Percent: 0 %
Brady Statistic AS VS Percent: 4 %
Date Time Interrogation Session: 20160829113810
Lead Channel Impedance Value: 429 Ohm
Lead Channel Pacing Threshold Amplitude: 0.5 V
Lead Channel Pacing Threshold Amplitude: 1 V
Lead Channel Pacing Threshold Pulse Width: 0.4 ms
Lead Channel Pacing Threshold Pulse Width: 0.4 ms
Lead Channel Sensing Intrinsic Amplitude: 8 mV
Lead Channel Setting Pacing Amplitude: 2.5 V
Lead Channel Setting Pacing Pulse Width: 0.4 ms
Lead Channel Setting Sensing Sensitivity: 2.8 mV
MDC IDC MSMT BATTERY IMPEDANCE: 159 Ohm
MDC IDC MSMT BATTERY VOLTAGE: 2.8 V
MDC IDC MSMT LEADCHNL RA IMPEDANCE VALUE: 508 Ohm
MDC IDC MSMT LEADCHNL RA SENSING INTR AMPL: 4 mV
MDC IDC SET LEADCHNL RA PACING AMPLITUDE: 2 V

## 2014-09-22 MED ORDER — FLECAINIDE ACETATE 50 MG PO TABS: 25.0000 mg | ORAL_TABLET | Freq: Two times a day (BID) | ORAL | Status: DC

## 2014-09-22 MED ORDER — CARVEDILOL 6.25 MG PO TABS: 6.2500 mg | ORAL_TABLET | Freq: Two times a day (BID) | ORAL | Status: DC

## 2014-09-22 NOTE — Progress Notes (Signed)
PCP: Jerlyn Ly, MD Primary Cardiologist: Peter Martinique, MD  Jo [name] is a 81 y.o. female who presents today for routine electrophysiology followup.  She has done well since her last visit.  She has rare palpitations but no sustained afib.  Today, she denies symptoms of chest pain, shortness of breath,  lower extremity edema, dizziness, presyncope, or syncope.  The patient is otherwise without complaint today.   Past Medical History  Diagnosis Date  . History of aortic insufficiency   . Anxiety   . Hypertension   . Pacemaker   . PAF (paroxysmal atrial fibrillation)     a. s/p pacemaker. b. s/p Medtronic gen change 07/2011.  . SSS (sick sinus syndrome)   . Type II diabetes mellitus     "controlled w/diet"  . Skin cancer ?1960's    "between breasts"  . Arthritis     "hands; between shoulders; back; feet"  . Panic attacks     "since losing husband 1994"  . Depression   . High risk medication use     on Flecainide  . Chronic anticoagulation   . Osteoporosis   . CHF (congestive heart failure)   . Kidney stone     pased in [place] 2014   Past Surgical History  Procedure Laterality Date  . Cesarean section  1955  . Cardiovascular stress test  12/09/2004  . Cataract extraction w/ intraocular lens  implant, bilateral  1990's  . Tonsillectomy      "school age"  . Appendectomy  1955  . Dilation and curettage of uterus  1953  . Breast cyst excision  ~ 1950    right  . Insert / replace / remove pacemaker  03/16/2006  . Skin cancer excision  ?1960's    "between my breasts"  . Permanent pacemaker generator change  08/03/2011    Procedure: PERMANENT PACEMAKER GENERATOR CHANGE;  Surgeon: Thompson Grayer, MD;  Location: [facility] CATH LAB;  Service: Cardiovascular;;  . Breast lumpectomy with needle localization Bilateral 02/03/2014    Procedure: BILATERAL BREAST LUMPECTOMY WITH NEEDLE LOCALIZATION ON LEFT;  Surgeon: Excell Seltzer, MD;  Location: [facility];  Service: General;  Laterality: Bilateral;     Current Outpatient Prescriptions  Medication Sig Dispense Refill  . apixaban (ELIQUIS) 2.5 MG TABS tablet Take 1 tablet (2.5 mg total) by mouth 2 (two) times daily. 60 tablet 11  . Calcium Carbonate (CALCIUM 600 PO) Take 600 mg by mouth daily as needed (calcium).     . carvedilol (COREG) 3.125 MG tablet Take 3.125 mg by mouth 2 (two) times daily.  3  . CRESTOR 5 MG tablet Take 2.5 mg by mouth 3 (three) times a week. Monday, Wednesday and Friday  7  . diltiazem (CARDIZEM CD) 240 MG 24 hr capsule Take 1 capsule by mouth daily.    . flecainide (TAMBOCOR) 50 MG tablet Take 1 tablet by mouth 2 (two) times daily.  3  . HYDROcodone-acetaminophen (NORCO/VICODIN) 5-325 MG per tablet Take 1-2 tablets by mouth every 4 (four) hours as needed for moderate pain. 30 tablet 0  . LORazepam (ATIVAN) 0.5 MG tablet Take 0.5-1 tablets by mouth 2 (two) times daily as needed. anxiety  5  . sertraline (ZOLOFT) 50 MG tablet Take 50 mg by mouth daily.  3  . Vitamin D, Ergocalciferol, (DRISDOL) 50000 UNITS CAPS capsule Take 50,000 Units by mouth every Wednesday.    . zoledronic acid (RECLAST) 5 MG/100ML SOLN injection as directed.     No current facility-administered medications  for this visit.   ROS- all systems are reviewed and negative except as per HPI above  Physical Exam: Filed Vitals:   09/22/14 1056  BP: 170/74  Pulse: 83  Height: 5\' 2"  (1.575 m)  Weight: 57.425 kg (126 lb 9.6 oz)    GEN- The patient is elderly appearing, alert and oriented x 3 today.   Head- normocephalic, atraumatic Eyes-  Sclera clear, conjunctiva pink Ears- hearing intact Oropharynx- clear Lungs- Clear to ausculation bilaterally, normal work of breathing Chest- pacemaker pocket is well healed Heart- Regular rate and rhythm, no murmurs, rubs or gallops, PMI not laterally displaced GI- soft, NT, ND, + BS Extremities- no clubbing, cyanosis, or edema  Pacemaker interrogation- reviewed in detail today,  See PACEART  report  Assessment and Plan:  1. Sick sinus syndrome Normal pacemaker function See Pace Art report No changes today  2. Paroxysmal atrial fibrillation Maintaining sinus rhythm Continue eliquis No prolonged afib in last 12 months by PPM interrogation today Reduce flecainide to 25mg  BID  3. HTN Elevated Followed by Dr Joylene Draft Will increase coreg to 6.25mg  BID today Consider adding low dose diuretic or ace inhibitor if BP remains elevated  carelink Follow-up with Dr Martinique as scheduled Return to see EP NP in 1 year

## 2014-09-22 NOTE — Patient Instructions (Addendum)
Medication Instructions:  Your physician has recommended you make the following change in your medication:  1) Increase Carvedilol to 6.25 mg twice daily 2) Decrease Flecainide to 25 mg twice daily    Labwork: None ordered  Testing/Procedures: None ordered  Follow-Up:  Your physician recommends that you schedule a follow-up appointment next available with Dr Martinique  Your physician wants you to follow-up in: 12 months with Chanetta Marshall, NP You will receive a reminder letter in the mail two months in advance. If you don't receive a letter, please call our office to schedule the follow-up appointment.  Remote monitoring is used to monitor your Pacemaker  from home. This monitoring reduces the number of office visits required to check your device to one time per year. It allows [place] to keep an eye on the functioning of your device to ensure it is working properly. You are scheduled for a device check from home on 12/22/14. You Vanegas send your transmission at any time that day. If you have a wireless device, the transmission will be sent automatically. After your physician reviews your transmission, you will receive a postcard with your next transmission date.    Any Other Special Instructions Will Be Listed Below (If Applicable).

## 2014-09-30 ENCOUNTER — Telehealth: Payer: Self-pay | Admitting: Cardiology

## 2014-09-30 ENCOUNTER — Other Ambulatory Visit: Payer: Self-pay | Admitting: *Deleted

## 2014-09-30 MED ORDER — FLECAINIDE ACETATE 50 MG PO TABS: 25.0000 mg | ORAL_TABLET | Freq: Two times a day (BID) | ORAL | Status: DC

## 2014-09-30 NOTE — Telephone Encounter (Signed)
Spoke with patient and explained which medications changed after OV with Dr. Rayann Heman and what the new doses are. Explained that Rx for carvedilol was sent to pharmacy 8/29 and Rx for flecainide was sent in today.

## 2014-09-30 NOTE — Telephone Encounter (Signed)
New Message   Pt calling to see if Dr.Jordan wants her to stay on her medication FLECAINARDE 50 MG  She states he is cut down to a half and and she dont know what to do next  Pt is hard of hearing

## 2014-10-07 ENCOUNTER — Ambulatory Visit (HOSPITAL_COMMUNITY)
Admission: RE | Admit: 2014-10-07 | Discharge: 2014-10-07 | Disposition: A | Discharge status: Home / Self Care | Payer: Medicare Other | Source: Ambulatory Visit | Attending: Internal Medicine | Admitting: Internal Medicine

## 2014-10-07 ENCOUNTER — Other Ambulatory Visit (HOSPITAL_COMMUNITY): Payer: Self-pay | Admitting: Internal Medicine

## 2014-10-07 ENCOUNTER — Encounter (HOSPITAL_COMMUNITY): Payer: Self-pay

## 2014-10-07 DIAGNOSIS — M810 Age-related osteoporosis without current pathological fracture: Secondary | ICD-10-CM | POA: Insufficient documentation

## 2014-10-07 MED ORDER — ZOLEDRONIC ACID 5 MG/100ML IV SOLN
5.0000 mg | Freq: Once | INTRAVENOUS | Status: AC
  Administered 2014-10-07: 5 mg via INTRAVENOUS
  Filled 2014-10-07: qty 100

## 2014-10-07 MED ORDER — SODIUM CHLORIDE 0.9 % IV SOLN
Freq: Once | INTRAVENOUS | Status: AC
  Administered 2014-10-07: 250 mL via INTRAVENOUS

## 2014-10-07 NOTE — Discharge Instructions (Signed)
RECLAST °Zoledronic Acid injection (Paget's Disease, Osteoporosis) °What is this medicine? °ZOLEDRONIC ACID (ZOE le dron ik AS id) lowers the amount of calcium loss from bone. It is used to treat Paget's disease and osteoporosis in women. °This medicine Spargo be used for other purposes; ask your health care provider or pharmacist if you have questions. °COMMON BRAND NAME(S): Reclast, Zometa °What should I tell my health care provider before I take this medicine? °They need to know if you have any of these conditions: °-aspirin-sensitive asthma °-cancer, especially if you are receiving medicines used to treat cancer °-dental disease or wear dentures °-infection °-kidney disease °-low levels of calcium in the blood °-past surgery on the parathyroid gland or intestines °-receiving corticosteroids like dexamethasone or prednisone °-an unusual or allergic reaction to zoledronic acid, other medicines, foods, dyes, or preservatives °-pregnant or trying to get pregnant °-breast-feeding °How should I use this medicine? °This medicine is for infusion into a vein. It is given by a health care professional in a hospital or clinic setting. °Talk to your pediatrician regarding the use of this medicine in children. This medicine is not approved for use in children. °Overdosage: If you think you have taken too much of this medicine contact a poison control center or emergency room at once. °NOTE: This medicine is only for you. Do not share this medicine with others. °What if I miss a dose? °It is important not to miss your dose. Call your doctor or health care professional if you are unable to keep an appointment. °What Alarid interact with this medicine? °-certain antibiotics given by injection °-NSAIDs, medicines for pain and inflammation, like ibuprofen or naproxen °-some diuretics like bumetanide, furosemide °-teriparatide °This list Kovar not describe all possible interactions. Give your health care provider a list of all the  medicines, herbs, non-prescription drugs, or dietary supplements you use. Also tell them if you smoke, drink alcohol, or use illegal drugs. Some items Mandigo interact with your medicine. °What should I watch for while using this medicine? °Visit your doctor or health care professional for regular checkups. It Guidry be some time before you see the benefit from this medicine. Do not stop taking your medicine unless your doctor tells you to. Your doctor Holford order blood tests or other tests to see how you are doing. °Women should inform their doctor if they wish to become pregnant or think they might be pregnant. There is a potential for serious side effects to an unborn child. Talk to your health care professional or pharmacist for more information. °You should make sure that you get enough calcium and vitamin D while you are taking this medicine. Discuss the foods you eat and the vitamins you take with your health care professional. °Some people who take this medicine have severe bone, joint, and/or muscle pain. This medicine Chauvin also increase your risk for jaw problems or a broken thigh bone. Tell your doctor right away if you have severe pain in your jaw, bones, joints, or muscles. Tell your doctor if you have any pain that does not go away or that gets worse. °Tell your dentist and dental surgeon that you are taking this medicine. You should not have major dental surgery while on this medicine. See your dentist to have a dental exam and fix any dental problems before starting this medicine. Take good care of your teeth while on this medicine. Make sure you see your dentist for regular follow-up appointments. °What side effects Orzel I notice from receiving this   medicine? °Side effects that you should report to your doctor or health care professional as soon as possible: °-allergic reactions like skin rash, itching or hives, swelling of the face, lips, or tongue °-anxiety, confusion, or depression °-breathing  problems °-changes in vision °-eye pain °-feeling faint or lightheaded, falls °-jaw pain, especially after dental work °-mouth sores °-muscle cramps, stiffness, or weakness °-trouble passing urine or change in the amount of urine °Side effects that usually do not require medical attention (report to your doctor or health care professional if they continue or are bothersome): °-bone, joint, or muscle pain °-constipation °-diarrhea °-fever °-hair loss °-irritation at site where injected °-loss of appetite °-nausea, vomiting °-stomach upset °-trouble sleeping °-trouble swallowing °-weak or tired °This list Ferrick not describe all possible side effects. Call your doctor for medical advice about side effects. You Schranz report side effects to FDA at 1-800-FDA-1088. °Where should I keep my medicine? °This drug is given in a hospital or clinic and will not be stored at home. °NOTE: This sheet is a summary. It Everman not cover all possible information. If you have questions about this medicine, talk to your doctor, pharmacist, or health care provider. °© 2015, Elsevier/Gold Standard. (2012-06-25 10:03:48) °Osteoporosis °Throughout your life, your body breaks down old bone and replaces it with new bone. As you get older, your body does not replace bone as quickly as it breaks it down. By the age of 30 years, most people begin to gradually lose bone because of the imbalance between bone loss and replacement. Some people lose more bone than others. Bone loss beyond a specified normal degree is considered osteoporosis.  °Osteoporosis affects the strength and durability of your bones. The inside of the ends of your bones and your flat bones, like the bones of your pelvis, look like honeycomb, filled with tiny open spaces. As bone loss occurs, your bones become less dense. This means that the open spaces inside your bones become bigger and the walls between these spaces become thinner. This makes your bones weaker. Bones of a person with  osteoporosis can become so weak that they can break (fracture) during minor accidents, such as a simple fall. °CAUSES  °The following factors have been associated with the development of osteoporosis: °· Smoking. °· Drinking more than 2 alcoholic drinks several days per week. °· Long-term use of certain medicines: °¨ Corticosteroids. °¨ Chemotherapy medicines. °¨ Thyroid medicines. °¨ Antiepileptic medicines. °¨ Gonadal hormone suppression medicine. °¨ Immunosuppression medicine. °· Being underweight. °· Lack of physical activity. °· Lack of exposure to the sun. This can lead to vitamin D deficiency. °· Certain medical conditions: °¨ Certain inflammatory bowel diseases, such as Crohn disease and ulcerative colitis. °¨ Diabetes. °¨ Hyperthyroidism. °¨ Hyperparathyroidism. °RISK FACTORS °Anyone can develop osteoporosis. However, the following factors can increase your risk of developing osteoporosis: °· Gender--Women are at higher risk than men. °· Age--Being older than 50 years increases your risk. °· Ethnicity--White and Asian people have an increased risk. °· Weight --Being extremely underweight can increase your risk of osteoporosis. °· Family history of osteoporosis--Having a family member who has developed osteoporosis can increase your risk. °SYMPTOMS  °Usually, people with osteoporosis have no symptoms.  °DIAGNOSIS  °Signs during a physical exam that Curt prompt your caregiver to suspect osteoporosis include: °· Decreased height. This is usually caused by the compression of the bones that form your spine (vertebrae) because they have weakened and become fractured. °· A curving or rounding of the upper back (kyphosis). °  To confirm signs of osteoporosis, your caregiver Anding request a procedure that uses 2 low-dose X-ray beams with different levels of energy to measure your bone mineral density (dual-energy X-ray absorptiometry [DXA]). Also, your caregiver Mash check your level of vitamin D. °TREATMENT  °The goal of  osteoporosis treatment is to strengthen bones in order to decrease the risk of bone fractures. There are different types of medicines available to help achieve this goal. Some of these medicines work by slowing the processes of bone loss. Some medicines work by increasing bone density. Treatment also involves making sure that your levels of calcium and vitamin D are adequate. °PREVENTION  °There are things you can do to help prevent osteoporosis. Adequate intake of calcium and vitamin D can help you achieve optimal bone mineral density. Regular exercise can also help, especially resistance and weight-bearing activities. If you smoke, quitting smoking is an important part of osteoporosis prevention. °MAKE SURE YOU: °· Understand these instructions. °· Will watch your condition. °· Will get help right away if you are not doing well or get worse. °FOR MORE INFORMATION °www.osteo.org and www.nof.org °Document Released: 10/20/2004 Document Revised: 05/07/2012 Document Reviewed: 12/25/2010 °ExitCare® Patient Information ©2015 [employer]. This information is not intended to replace advice given to you by your health care provider. Make sure you discuss any questions you have with your health care provider. ° ° °

## 2014-10-07 NOTE — Progress Notes (Signed)
Pt states she" feels fine" and has had an uneventful infusion of RECLAST

## 2014-10-07 NOTE — Progress Notes (Signed)
Pt arrived today for RECLAST infusion. States she had a change in her medicationf with Dr Rayann Heman and" wasn't sure it was agreeing" with her. Upon further discussion states her Carvedilol was increased to 2 pills 2 x daily. She states she has some dizziness and was wondering if this change in medication would effect her RECLAST infusion. VSS ( see flowchart) pt is warm and dry. Her daughter and patient both stated that she was having some dizziness before the medication increase by Dr Rayann Heman. After sitting in recliner for 15 minutes states she feels better and is more relaxed and and would like to proceed with infusion. She states she is checking her BP at home and reporting it to Dr Joylene Draft. I encouraged her to call Dr Joylene Draft or Dr Rayann Heman to discuss any concerns and they both verbalized understanding

## 2014-12-22 ENCOUNTER — Telehealth: Payer: Self-pay | Admitting: Internal Medicine

## 2014-12-22 ENCOUNTER — Ambulatory Visit (INDEPENDENT_AMBULATORY_CARE_PROVIDER_SITE_OTHER): Payer: Medicare Other | Admitting: *Deleted

## 2014-12-22 DIAGNOSIS — I495 Sick sinus syndrome: Secondary | ICD-10-CM

## 2014-12-22 NOTE — Telephone Encounter (Signed)
Informed pt that her transmission was received.  

## 2014-12-22 NOTE — Progress Notes (Signed)
Remote pacemaker transmission.   

## 2014-12-22 NOTE — Telephone Encounter (Signed)
°  1. Has your device fired? no ° °2. Is you device beeping? no ° °3. Are you experiencing draining or swelling at device site? no ° °4. Are you calling to see if we received your device transmission? yes ° °5. Have you passed out? no ° °

## 2014-12-30 ENCOUNTER — Encounter: Payer: Self-pay | Admitting: Cardiology

## 2014-12-30 LAB — CUP PACEART REMOTE DEVICE CHECK
Battery Impedance: 159 Ohm
Battery Remaining Longevity: 129 mo
Brady Statistic AP VP Percent: 0 %
Brady Statistic AP VS Percent: 98 %
Brady Statistic AS VP Percent: 0 %
Brady Statistic AS VS Percent: 2 %
Implantable Lead Implant Date: 20080221
Implantable Lead Location: 753859
Implantable Lead Location: 753860
Implantable Lead Model: 5076
Lead Channel Impedance Value: 423 Ohm
Lead Channel Impedance Value: 506 Ohm
Lead Channel Pacing Threshold Amplitude: 0.5 V
Lead Channel Pacing Threshold Pulse Width: 0.4 ms
Lead Channel Sensing Intrinsic Amplitude: 5.6 mV
MDC IDC LEAD IMPLANT DT: 20080221
MDC IDC MSMT BATTERY VOLTAGE: 2.8 V
MDC IDC MSMT LEADCHNL RV PACING THRESHOLD AMPLITUDE: 0.875 V
MDC IDC MSMT LEADCHNL RV PACING THRESHOLD PULSEWIDTH: 0.4 ms
MDC IDC SESS DTM: 20161128193631
MDC IDC SET LEADCHNL RA PACING AMPLITUDE: 2 V
MDC IDC SET LEADCHNL RV PACING AMPLITUDE: 2.5 V
MDC IDC SET LEADCHNL RV PACING PULSEWIDTH: 0.4 ms
MDC IDC SET LEADCHNL RV SENSING SENSITIVITY: 2.8 mV

## 2015-01-06 DIAGNOSIS — I480 Paroxysmal atrial fibrillation: Secondary | ICD-10-CM | POA: Diagnosis not present

## 2015-01-06 DIAGNOSIS — Z950 Presence of cardiac pacemaker: Secondary | ICD-10-CM | POA: Diagnosis not present

## 2015-01-06 DIAGNOSIS — E119 Type 2 diabetes mellitus without complications: Secondary | ICD-10-CM | POA: Diagnosis not present

## 2015-01-06 DIAGNOSIS — Z6823 Body mass index (BMI) 23.0-23.9, adult: Secondary | ICD-10-CM | POA: Diagnosis not present

## 2015-01-06 DIAGNOSIS — I10 Essential (primary) hypertension: Secondary | ICD-10-CM | POA: Diagnosis not present

## 2015-01-06 DIAGNOSIS — Z23 Encounter for immunization: Secondary | ICD-10-CM | POA: Diagnosis not present

## 2015-01-07 ENCOUNTER — Encounter: Payer: Self-pay | Admitting: Cardiology

## 2015-01-07 ENCOUNTER — Ambulatory Visit (INDEPENDENT_AMBULATORY_CARE_PROVIDER_SITE_OTHER): Payer: Medicare Other | Admitting: Cardiology

## 2015-01-07 VITALS — BP 140/60 | HR 67 | Ht 62.0 in | Wt 128.0 lb

## 2015-01-07 DIAGNOSIS — I495 Sick sinus syndrome: Secondary | ICD-10-CM

## 2015-01-07 DIAGNOSIS — I10 Essential (primary) hypertension: Secondary | ICD-10-CM

## 2015-01-07 DIAGNOSIS — Z8679 Personal history of other diseases of the circulatory system: Secondary | ICD-10-CM

## 2015-01-07 DIAGNOSIS — I480 Paroxysmal atrial fibrillation: Secondary | ICD-10-CM

## 2015-01-07 NOTE — Patient Instructions (Signed)
Continue your current therapy  I will see you in 6 months.   

## 2015-01-07 NOTE — Progress Notes (Signed)
Jo [name] Date of Birth: [birth year] Medical Record [id number]  History of Present Illness: Jo [name] is seen back today for follow up Afib. She has a history of AI, depression, DM, HTN and PAF with a pacemaker in place. Has been on chronic Flecainide and Eliquis. Last generator change back in 2013. On follow up she is doing well. On her last visit with Dr. Rayann Heman her BP was elevated and he increased her Coreg dose. BP has improved. He also reduced her Flecainide to 25 mg bid to minimize potential toxicity at her age.  Denies any chest pain or SOB. No palpitations. She notes some visual change since medication changes but also just got some new glasses and reads extensively.  Current Outpatient Prescriptions  Medication Sig Dispense Refill  . apixaban (ELIQUIS) 2.5 MG TABS tablet Take 1 tablet (2.5 mg total) by mouth 2 (two) times daily. 60 tablet 11  . Calcium Carbonate (CALCIUM 600 PO) Take 600 mg by mouth daily as needed (calcium).     . carvedilol (COREG) 6.25 MG tablet Take 1 tablet (6.25 mg total) by mouth 2 (two) times daily. 180 tablet 3  . CRESTOR 5 MG tablet Take 2.5 mg by mouth 3 (three) times a week. Monday, Wednesday and Friday  7  . diltiazem (CARDIZEM CD) 240 MG 24 hr capsule Take 1 capsule by mouth daily.    . flecainide (TAMBOCOR) 50 MG tablet Take 0.5 tablets (25 mg total) by mouth 2 (two) times daily. 90 tablet 0  . HYDROcodone-acetaminophen (NORCO/VICODIN) 5-325 MG per tablet Take 1-2 tablets by mouth every 4 (four) hours as needed for moderate pain. 30 tablet 0  . LORazepam (ATIVAN) 0.5 MG tablet Take 0.5-1 tablets by mouth 2 (two) times daily as needed. anxiety  5  . sertraline (ZOLOFT) 50 MG tablet Take 50 mg by mouth daily.  3  . Vitamin D, Ergocalciferol, (DRISDOL) 50000 UNITS CAPS capsule Take 50,000 Units by mouth every Wednesday.    . zoledronic acid (RECLAST) 5 MG/100ML SOLN injection as directed.     No current facility-administered medications for this visit.     Allergies  Allergen Reactions  . Sulfonamide Derivatives Rash    "it was all over my legs"  . Citalopram Hydrobromide     Pt does not remember reaction    Past Medical History  Diagnosis Date  . History of aortic insufficiency   . Anxiety   . Hypertension   . Pacemaker   . PAF (paroxysmal atrial fibrillation) ([facility])     a. s/p pacemaker. b. s/p Medtronic gen change 07/2011.  . SSS (sick sinus syndrome) ([place])   . Type II diabetes mellitus ([place])     "controlled w/diet"  . Skin cancer ?1960's    "between breasts"  . Arthritis     "hands; between shoulders; back; feet"  . Panic attacks     "since losing husband 1994"  . Depression   . High risk medication use     on Flecainide  . Chronic anticoagulation   . Osteoporosis   . CHF (congestive heart failure) ([place])   . Kidney stone     pased in [place] 2014  . Osteoporosis     Past Surgical History  Procedure Laterality Date  . Cesarean section  1955  . Cardiovascular stress test  12/09/2004  . Cataract extraction w/ intraocular lens  implant, bilateral  1990's  . Tonsillectomy      "school age"  . Appendectomy  1955  .  Dilation and curettage of uterus  1953  . Breast cyst excision  ~ 1950    right  . Insert / replace / remove pacemaker  03/16/2006  . Skin cancer excision  ?1960's    "between my breasts"  . Permanent pacemaker generator change  08/03/2011    Procedure: PERMANENT PACEMAKER GENERATOR CHANGE;  Surgeon: Thompson Grayer, MD;  Location: [facility] C[facility] LAB;  Service: Cardiovascular;;  . Breast lumpectomy with needle localization Bilateral 02/03/2014    Procedure: BILATERAL BREAST LUMPECTOMY WITH NEEDLE LOCALIZATION ON LEFT;  Surgeon: Excell Seltzer, MD;  Location: [facility];  Service: General;  Laterality: Bilateral;    History  Smoking status  . Never Smoker   Smokeless tobacco  . Never Used    History  Alcohol Use No    Family History  Problem Relation Age of Onset  . Emphysema Father     Review of  Systems: The review of systems is per the HPI.  All other systems were reviewed and are negative.  Physical Exam: BP 140/60 mmHg  Pulse 67  Ht 5\' 2"  (1.575 m)  Wt 58.06 kg (128 lb)  BMI 23.41 kg/m2 Patient is very pleasant and in no acute distress. She is hard of hearing. Skin is warm and dry. Color is normal.  HEENT is unremarkable. Normocephalic/atraumatic. PERRL. Sclera are nonicteric. Neck is supple. No masses. No JVD. Lungs are clear. Cardiac exam shows a regular rate and rhythm. Abdomen is soft. Extremities are without edema. Gait and ROM are intact. No gross neurologic deficits noted.  LABORATORY DATA:  Lab Results  Component Value Date   WBC 13.8* 02/04/2014   HGB 13.3 02/04/2014   HCT 39.4 02/04/2014   PLT 182 02/04/2014   GLUCOSE 287* 02/04/2014   ALT 16 07/10/2012   AST 8 07/10/2012   NA 135 02/04/2014   K 4.5 02/04/2014   CL 109 02/04/2014   CREATININE 0.66 02/04/2014   BUN 15 02/04/2014   CO2 23 02/04/2014   INR 1.41 07/06/2012   Ecg today shows atrial paced rhythm with rate 67. PR interval 226 msec. Otherwise normal. I have personally reviewed and interpreted this study.   Assessment / Plan:  1. PAF - has PPM in place. Remains on Flecainide at reduced dose on 25 mg twice a day and Eliquis. She is maintaining sinus rhythm well. Will monitor for recurrence with her pacemaker checks.   2. HTN - BP improved with increase in Coreg dose.   3. Chronic Diastolic HF - well compensated today. No change in her current therapy.   I will follow up in 6 months.

## 2015-01-14 ENCOUNTER — Other Ambulatory Visit: Payer: Self-pay | Admitting: Internal Medicine

## 2015-03-01 IMAGING — US US RENAL PORT
1 series · 13 of 25 positions shown · non-contrast
Comparison: Abdomen pelvis CT obtained earlier today.

CLINICAL DATA: Left ureteral calculus causing mild to moderate left
hydronephrosis on an abdomen and pelvis CT earlier today.

RENAL/URINARY TRACT ULTRASOUND COMPLETE

[Series 1: us renal port · 0.26mm/px · 13 of 89 slices shown]
[im 1/89]
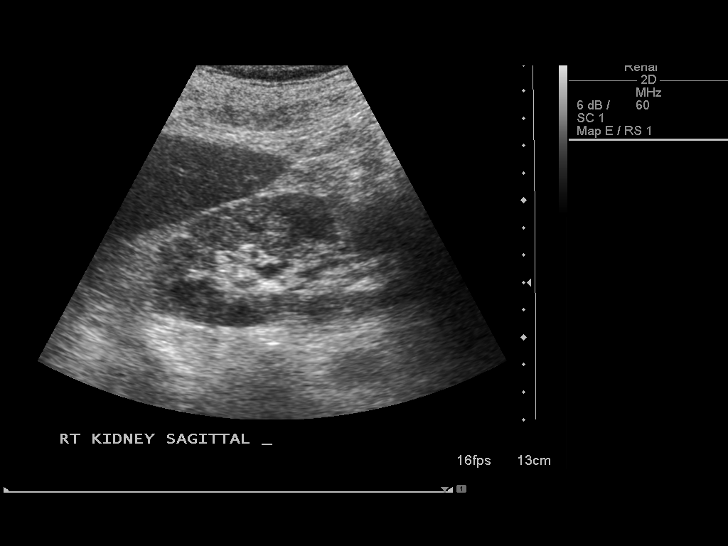
[im 8/89]
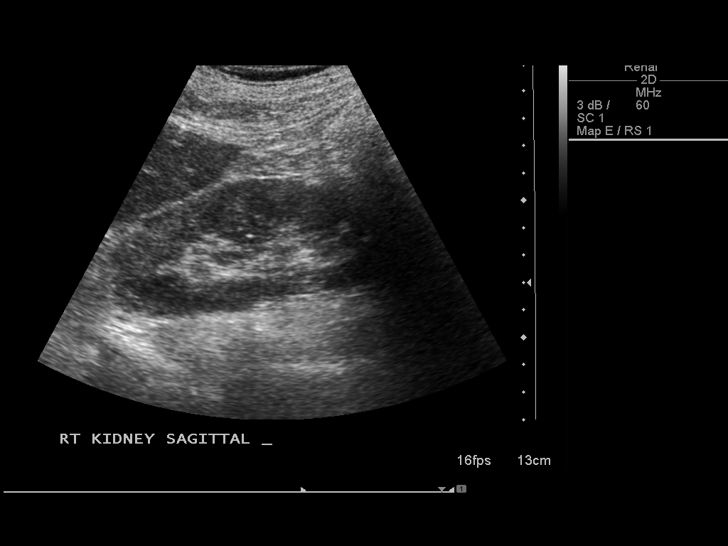
[im 15/89]
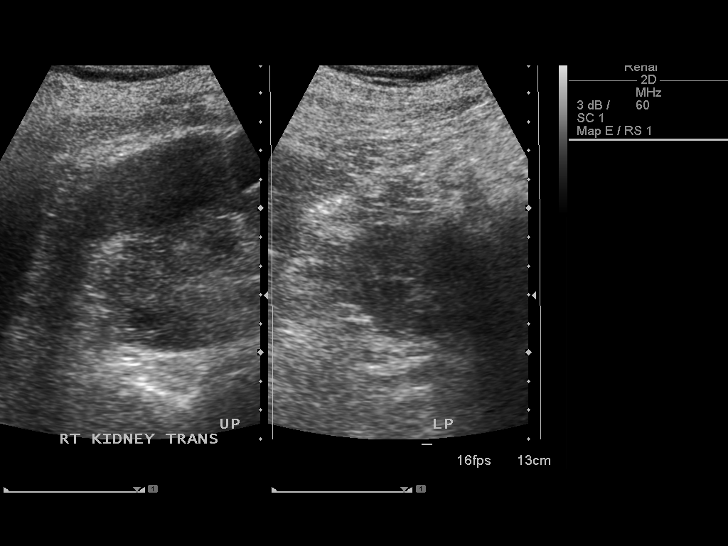
[im 23/89]
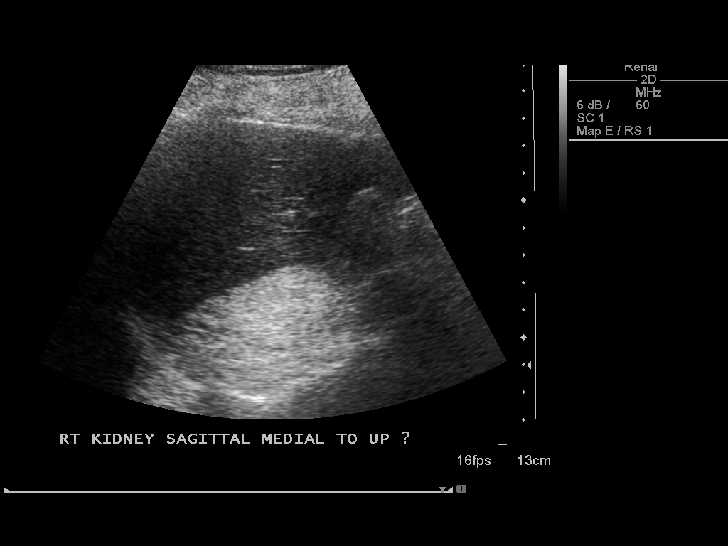
[im 30/89]
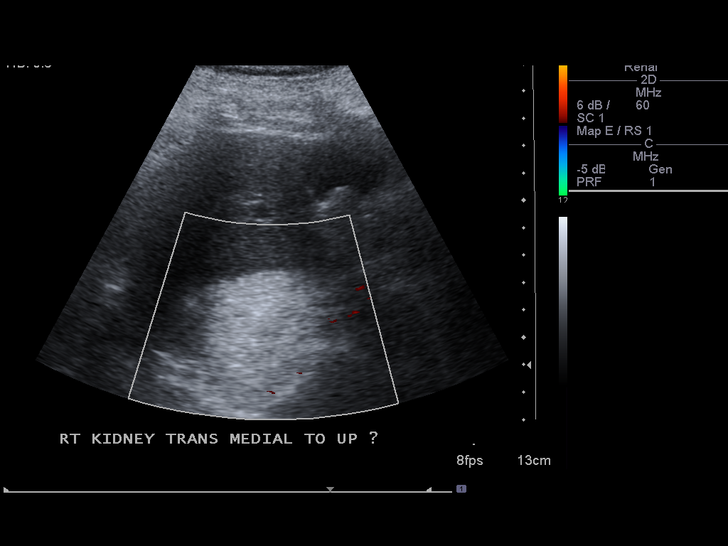
[im 37/89]
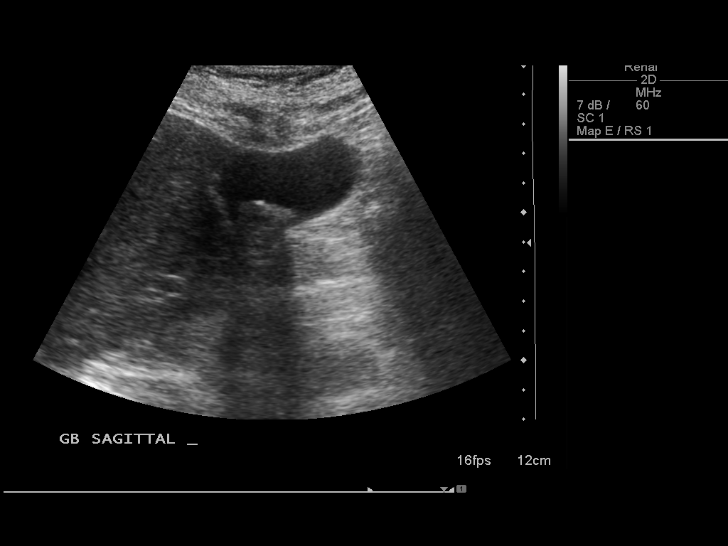
[im 45/89]
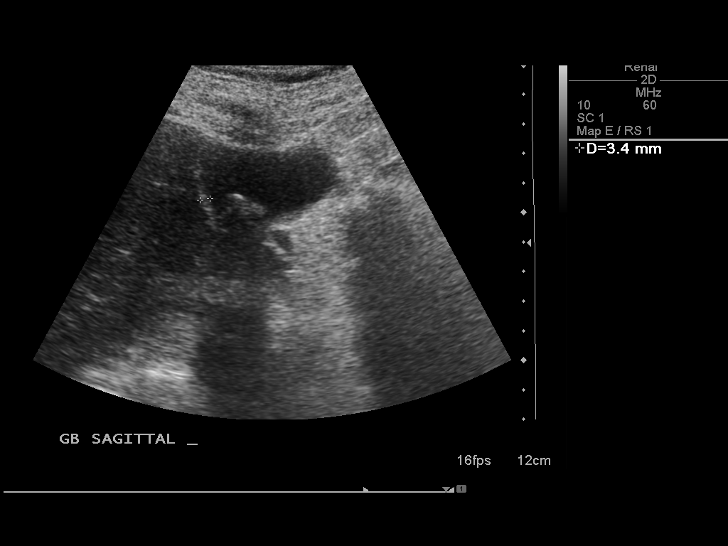
[im 52/89]
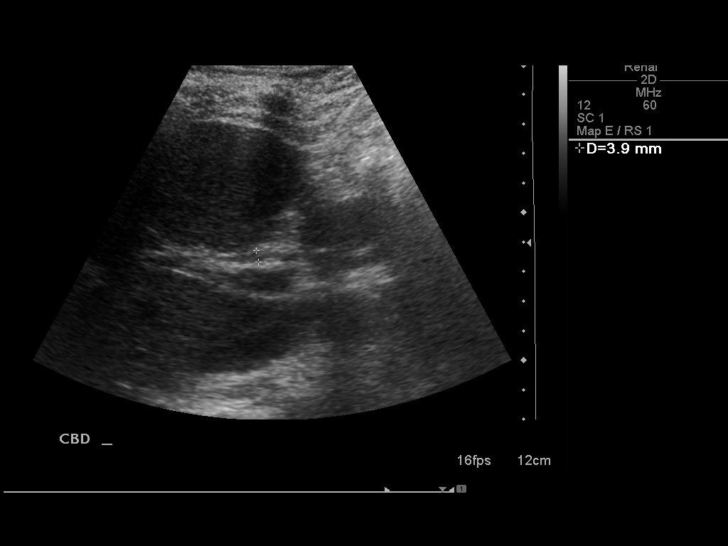
[im 59/89]
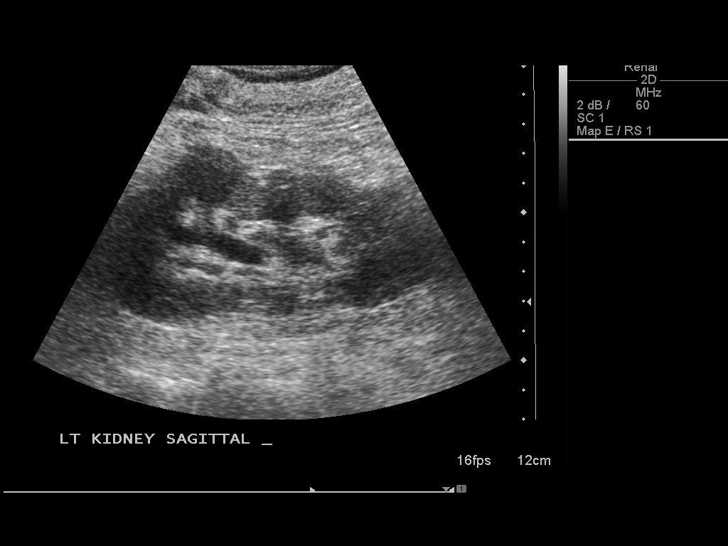
[im 67/89]
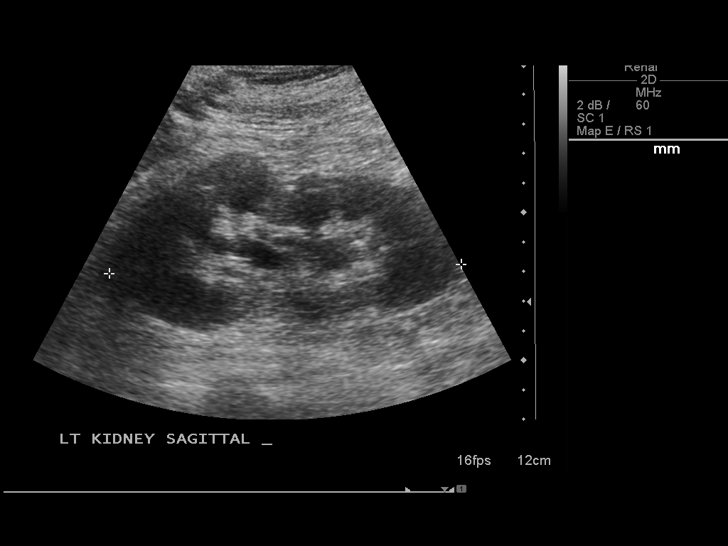
[im 74/89]
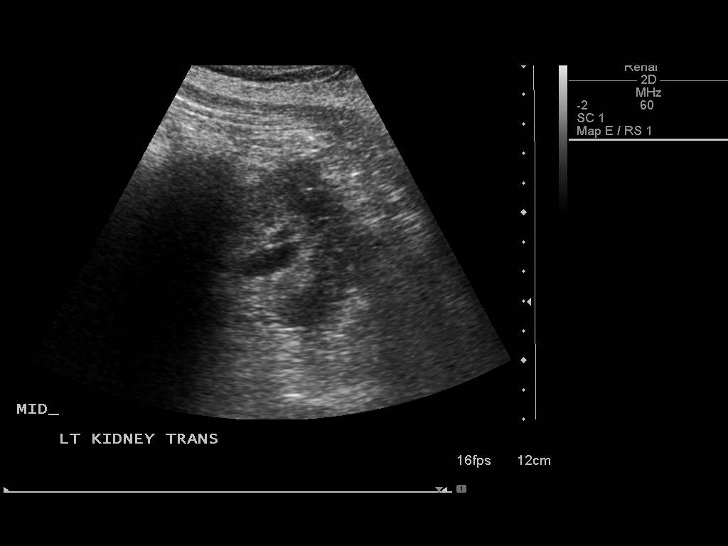
[im 81/89]
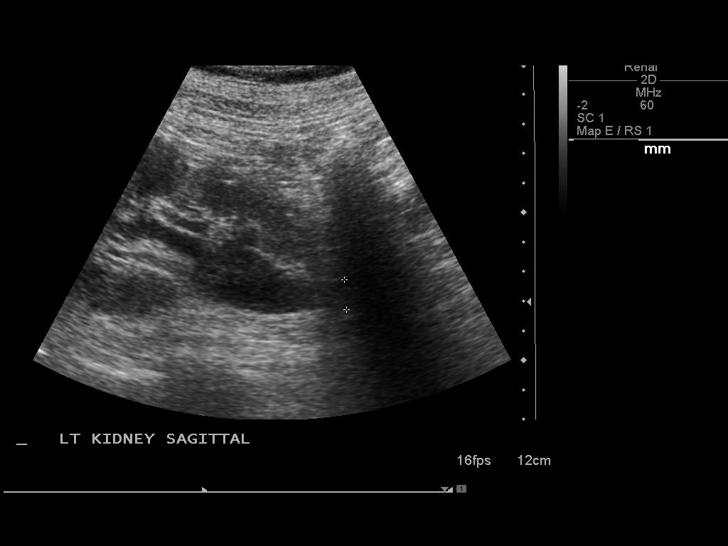
[im 89/89]
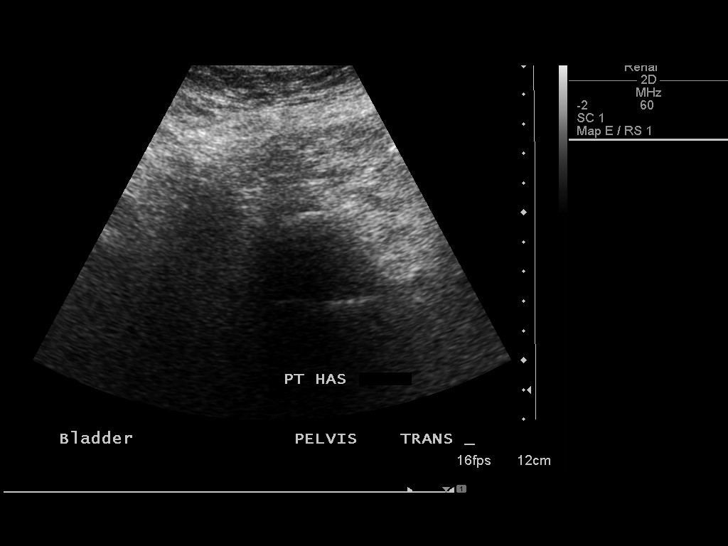

[13 of 25 positions shown; findings below may reference images not displayed]

FINDINGS: Right Kidney:  Normal, measuring 10.9 cm in length.  Previously
demonstrated prominent extrarenal pelvis.

Left Kidney:  Mild to moderate dilatation of the left renal
collecting system with possible mild improvement.  Otherwise,
normal with fetal lobulations, measuring 11.9 cm in length.

Bladder:  Nondistended with a Foley catheter in place.

Multiple gallstones are noted in the gallbladder measuring up to
1.8 cm in maximum diameter each.  Unable to determine if the
patient was focally tender over the gallbladder due to medication.
No gallbladder wall thickening or pericholecystic fluid.

Normal caliber common duct measuring 3.9 mm in diameter proximally.

Poorly defined rounded echogenic mass superior to the right kidney,
corresponding to the large adrenal myelolipoma seen on the CT.
IMPRESSION: 1.  Mild to moderate left hydronephrosis with possible mild
improvement (difficult to directly compare different modalities).
2.  Cholelithiasis without evidence of cholecystitis.
3.  Previously demonstrated large right adrenal myelolipoma.

## 2015-03-01 IMAGING — CR DG ABDOMEN ACUTE W/ 1V CHEST
3 series · 3 of 3 positions shown · non-contrast
Comparison: Chest 06/04/2012

CLINICAL DATA: Abdominal pain

ACUTE ABDOMEN SERIES (ABDOMEN 2 VIEW & CHEST 1 VIEW)

[w abdomen decub]
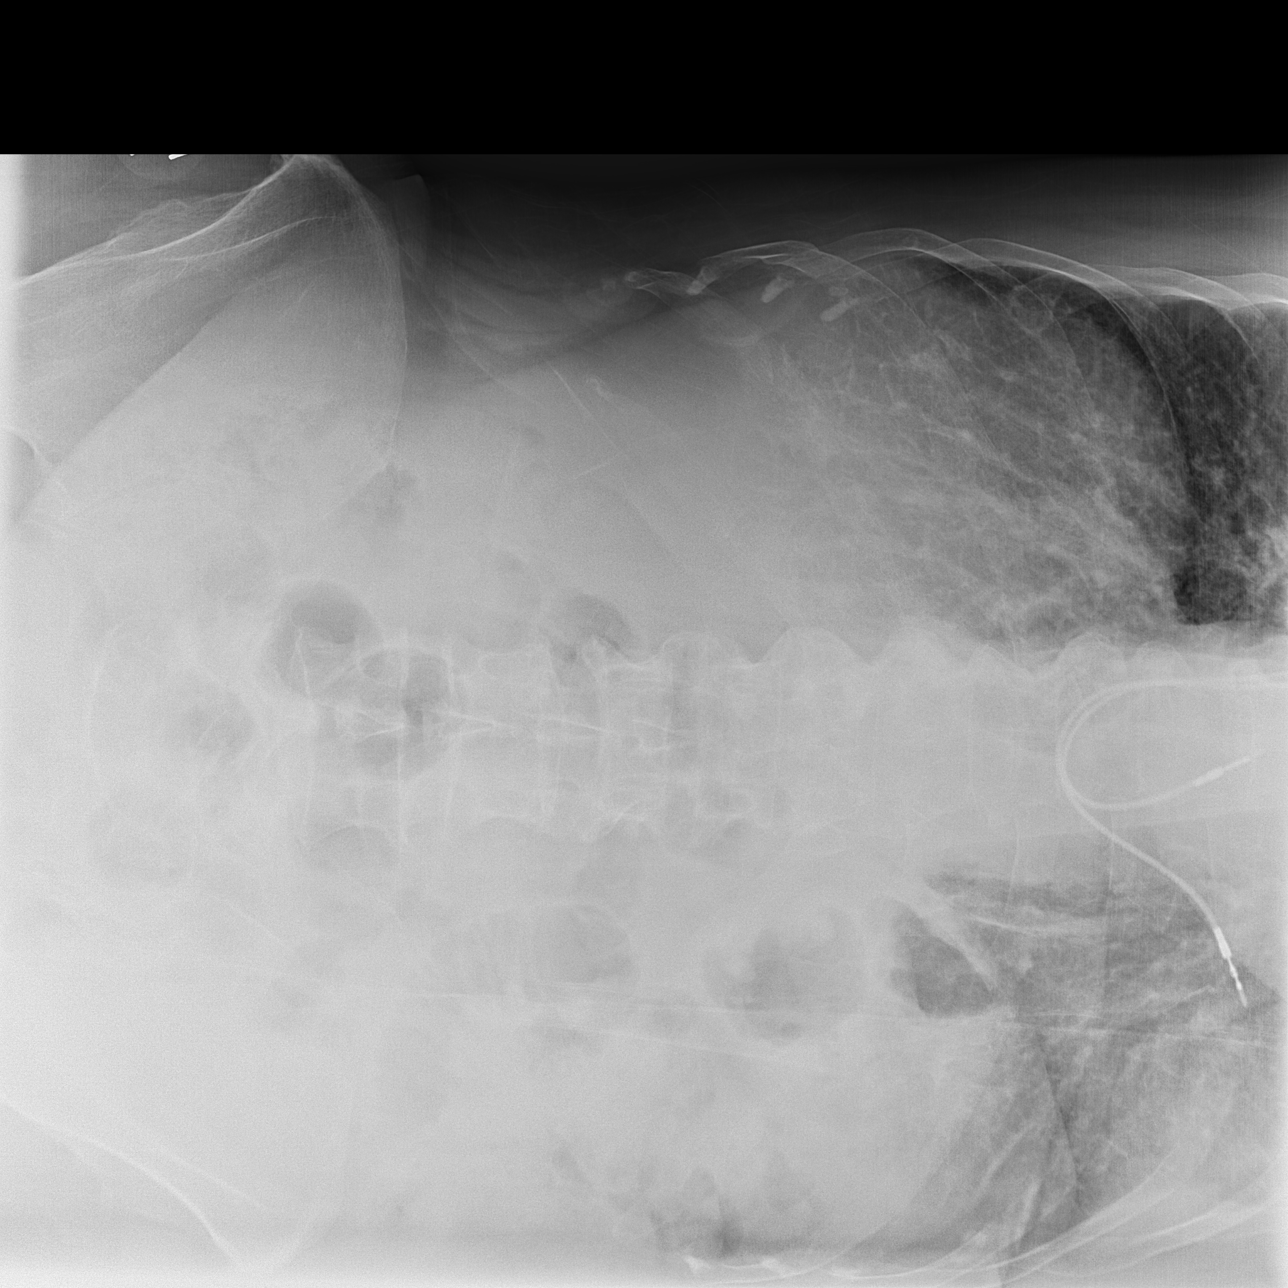

[t abdomen supine]
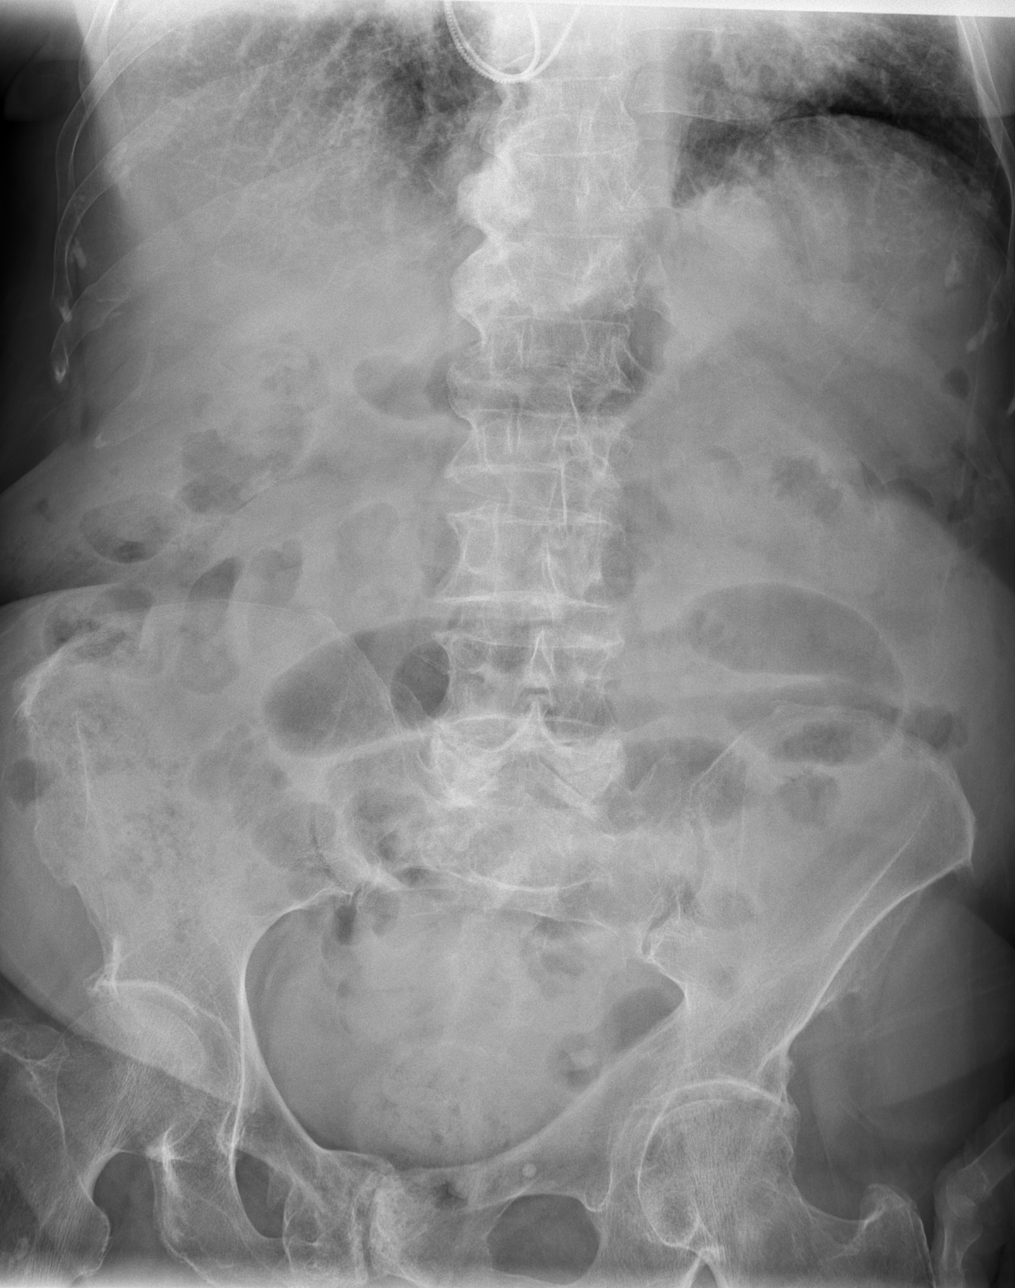

[t chest supine]
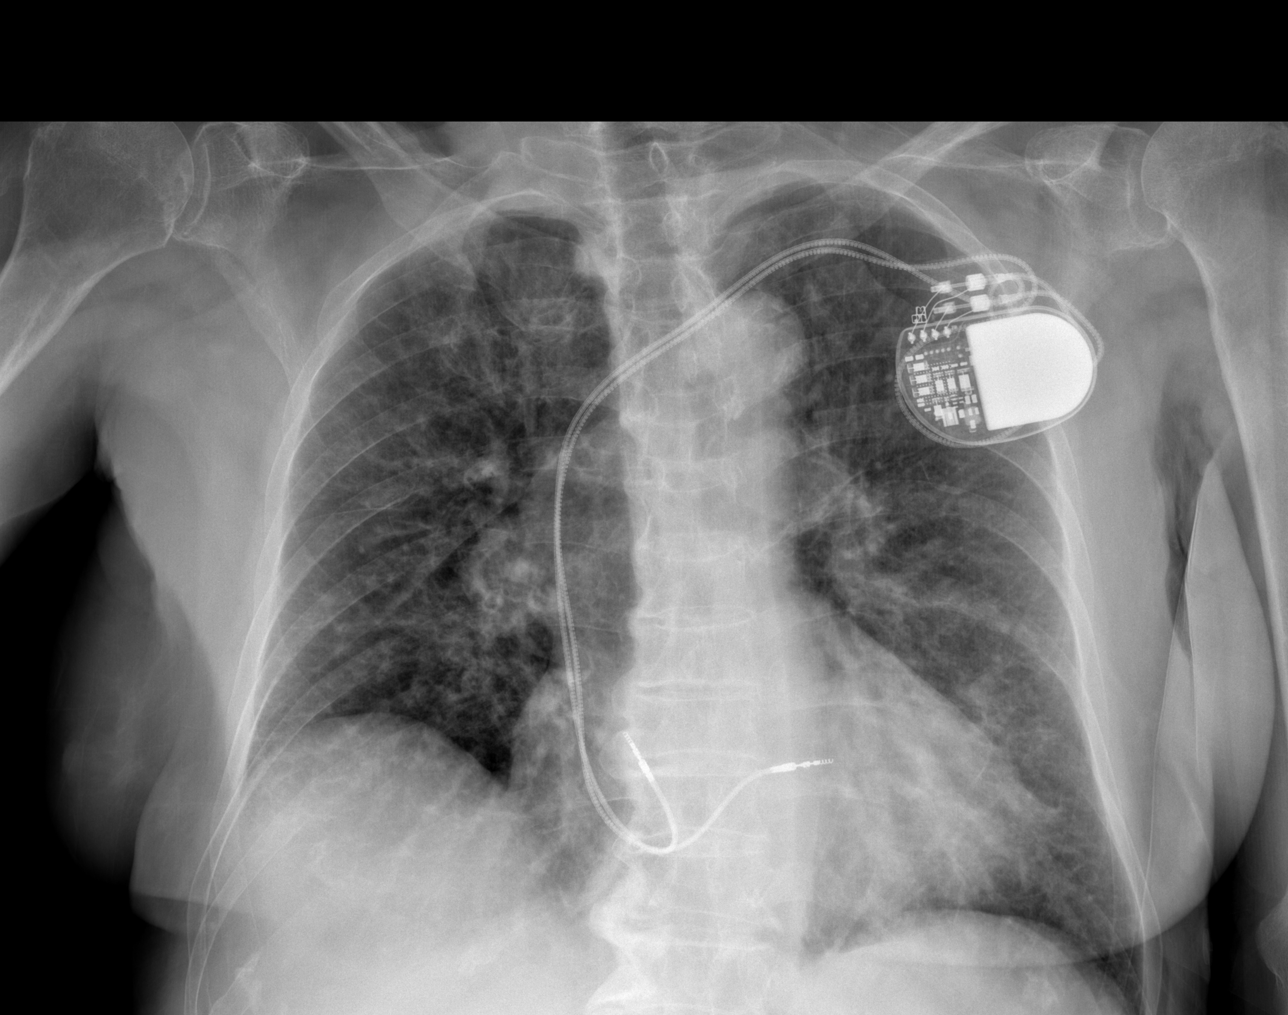

[3 of 3 positions shown; findings below may reference images not displayed]

FINDINGS: Increased lung markings diffusely and bilaterally
compared to the prior study.  This may be due to heart failure and
vascular congestion.  Acute pulmonary infection diffusely is also
possible.  Negative for lumbar infiltrate.  Pacemaker in good
position.  No effusion.

Abdomen:  Nonobstructive bowel gas pattern.  No free air.  No
dilated bowel loops.  No renal calculi.
IMPRESSION: Prominent lung markings diffusely and bilaterally suggestive of
fluid overload however diffuse infection also possible

Nonobstructive bowel gas pattern.

## 2015-03-02 IMAGING — CR DG CHEST 1V PORT
1 series · 1 of 1 positions shown · non-contrast
Comparison: Recent chest CT 07/06/2012; prior chest x-ray
06/04/2012

CLINICAL DATA: Evaluate for pulmonary edema

PORTABLE CHEST - 1 VIEW

[AP]
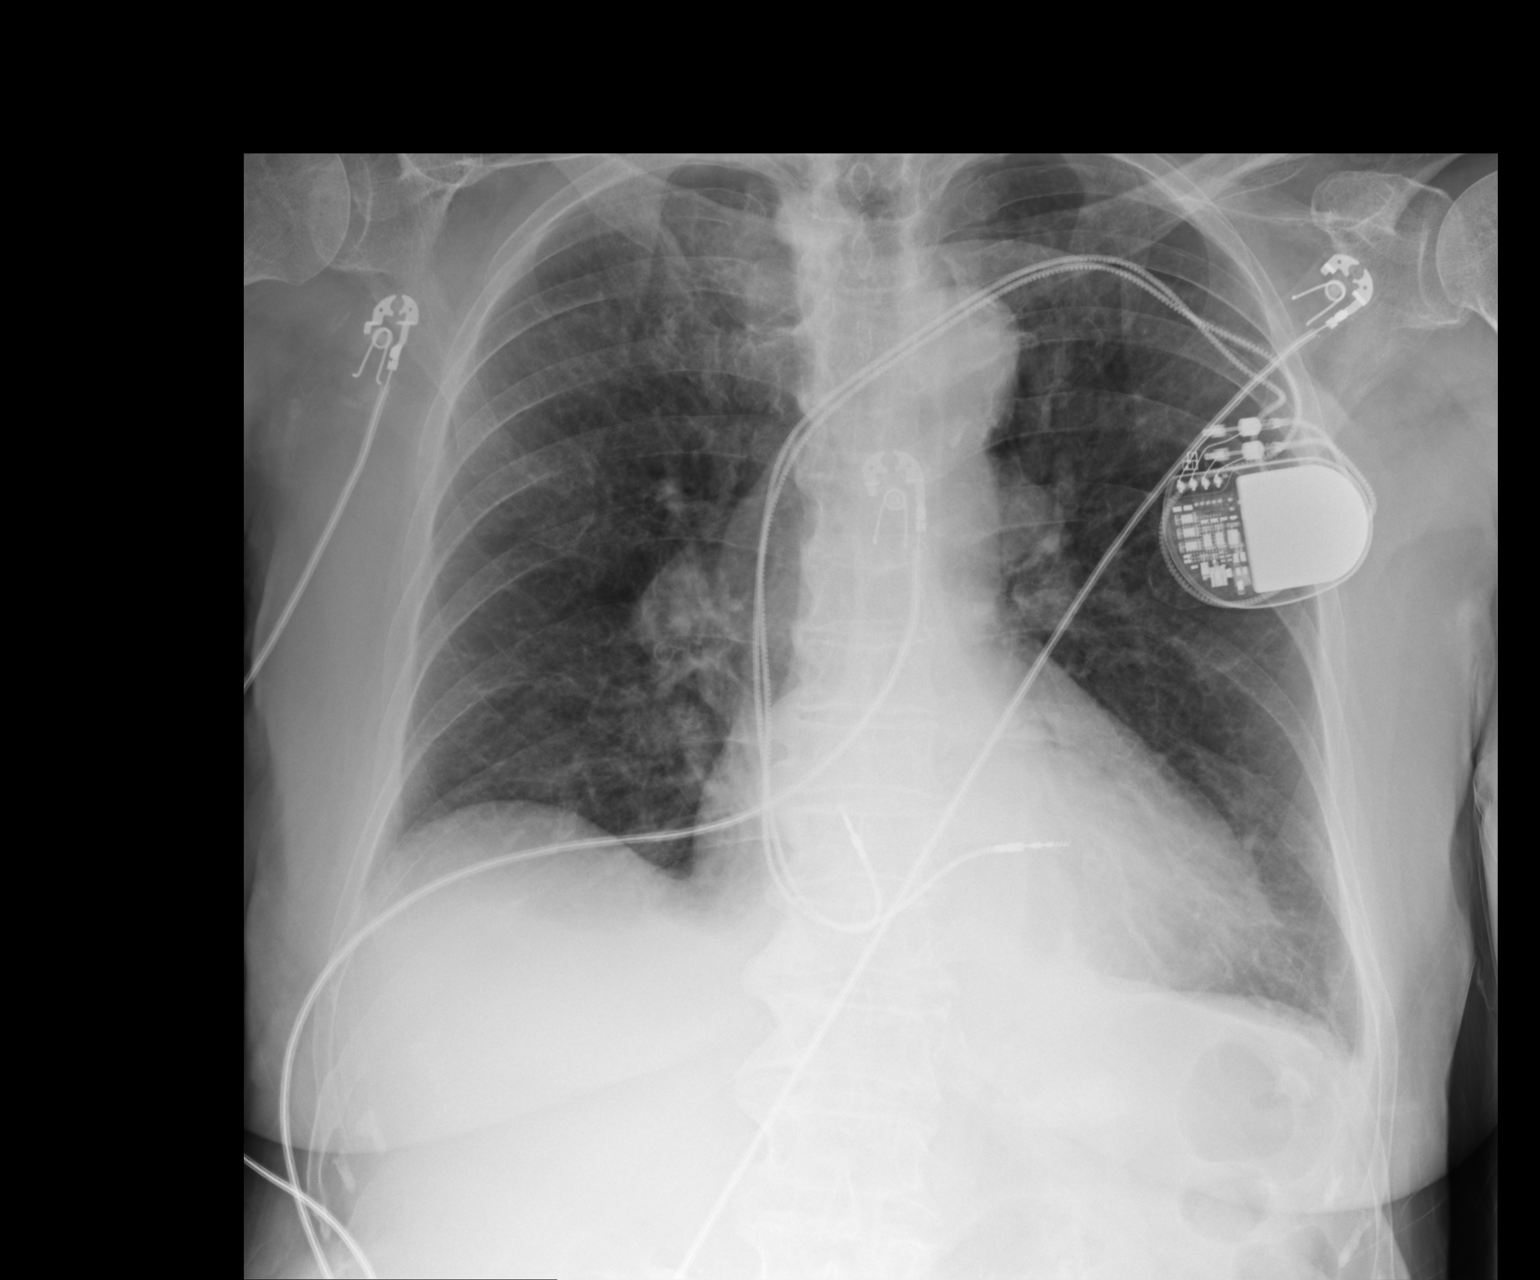

[1 of 1 positions shown; findings below may reference images not displayed]

FINDINGS: Stable position of left subclavian approach cardiac
rhythm maintenance device with leads projecting over the right
atrium and right ventricle.  Inspiratory volumes are slightly low
and there is trace bibasilar subsegmental atelectasis.  Mild
pulmonary vascular congestion without overt edema.  Stable cardiac
and mediastinal contours with enlargement of the bilateral main
pulmonary arteries.  Atherosclerotic calcifications noted in the
transverse aorta.  No acute osseous abnormality.
IMPRESSION: 1.  Decreased inspiratory volumes with mild bibasilar atelectasis.
2.  Mild pulmonary vascular congestion without overt edema

## 2015-03-23 ENCOUNTER — Ambulatory Visit (INDEPENDENT_AMBULATORY_CARE_PROVIDER_SITE_OTHER): Payer: Medicare Other | Admitting: *Deleted

## 2015-03-23 ENCOUNTER — Telehealth: Payer: Self-pay | Admitting: Cardiology

## 2015-03-23 DIAGNOSIS — I495 Sick sinus syndrome: Secondary | ICD-10-CM | POA: Diagnosis not present

## 2015-03-23 NOTE — Telephone Encounter (Signed)
Confirmed remote transmission w/ pt daguhter.   

## 2015-03-23 NOTE — Progress Notes (Signed)
Remote pacemaker transmission.   

## 2015-04-10 ENCOUNTER — Encounter: Payer: Self-pay | Admitting: Cardiology

## 2015-04-10 LAB — CUP PACEART REMOTE DEVICE CHECK
Battery Remaining Longevity: 125 mo
Battery Voltage: 2.8 V
Brady Statistic AS VP Percent: 0 %
Date Time Interrogation Session: 20170227153002
Implantable Lead Implant Date: 20080221
Implantable Lead Location: 753859
Implantable Lead Location: 753860
Implantable Lead Model: 4076
Implantable Lead Model: 5076
Lead Channel Pacing Threshold Amplitude: 0.875 V
Lead Channel Pacing Threshold Pulse Width: 0.4 ms
Lead Channel Setting Pacing Amplitude: 2 V
Lead Channel Setting Pacing Amplitude: 2.5 V
Lead Channel Setting Pacing Pulse Width: 0.4 ms
Lead Channel Setting Sensing Sensitivity: 2.8 mV
MDC IDC LEAD IMPLANT DT: 20080221
MDC IDC MSMT BATTERY IMPEDANCE: 183 Ohm
MDC IDC MSMT LEADCHNL RA IMPEDANCE VALUE: 531 Ohm
MDC IDC MSMT LEADCHNL RA PACING THRESHOLD AMPLITUDE: 0.5 V
MDC IDC MSMT LEADCHNL RA PACING THRESHOLD PULSEWIDTH: 0.4 ms
MDC IDC MSMT LEADCHNL RV IMPEDANCE VALUE: 441 Ohm
MDC IDC MSMT LEADCHNL RV SENSING INTR AMPL: 5.6 mV
MDC IDC STAT BRADY AP VP PERCENT: 0 %
MDC IDC STAT BRADY AP VS PERCENT: 98 %
MDC IDC STAT BRADY AS VS PERCENT: 1 %

## 2015-05-12 ENCOUNTER — Telehealth: Payer: Self-pay | Admitting: Cardiology

## 2015-05-12 NOTE — Telephone Encounter (Signed)
NEW MESSAGE   Pt daughter is calling stated pt has gotten weaker  Patient c/o Palpitations:  High priority if patient c/o lightheadedness and shortness of breath.  1. How long have you been having palpitations? Last week 2. Are you currently experiencing lightheadedness and shortness of breath? no  3. Have you checked your BP and heart rate? (document readings) it was fine the last time she had it checked  4. Are you experiencing any other symptoms? No  On pt nose and her lip it is bluish color, she noticed it in the past, she did not tell her mothers PCP

## 2015-05-12 NOTE — Telephone Encounter (Signed)
Spoke with daughter-in-law this am, patient gave me verbal permission to speak with her Patient has been having chest discomfort, decreased energy, increased palpitations, and "hard" heart beat She has been feeling this way over the last week and felt so bad one afternoon almost called EMS At times daughter-in-law has noticed at the base of nose and her upper lip having a "blue tinge"  Patient does have some difficulty with her vision not being focussed at times and concerned coming from Flecainide, current on eye doctor follow ups Did schedule appointment for patient to see Allie Bossier NP on Thursday, date and time given to daughter-in-law

## 2015-05-14 ENCOUNTER — Encounter: Payer: Self-pay | Admitting: Nurse Practitioner

## 2015-05-14 ENCOUNTER — Ambulatory Visit (INDEPENDENT_AMBULATORY_CARE_PROVIDER_SITE_OTHER): Payer: Medicare Other | Admitting: Nurse Practitioner

## 2015-05-14 VITALS — BP 136/76 | HR 83 | Ht 61.0 in | Wt 123.4 lb

## 2015-05-14 DIAGNOSIS — I351 Nonrheumatic aortic (valve) insufficiency: Secondary | ICD-10-CM

## 2015-05-14 DIAGNOSIS — I5032 Chronic diastolic (congestive) heart failure: Secondary | ICD-10-CM | POA: Insufficient documentation

## 2015-05-14 DIAGNOSIS — I480 Paroxysmal atrial fibrillation: Secondary | ICD-10-CM

## 2015-05-14 DIAGNOSIS — I10 Essential (primary) hypertension: Secondary | ICD-10-CM

## 2015-05-14 DIAGNOSIS — I951 Orthostatic hypotension: Secondary | ICD-10-CM

## 2015-05-14 DIAGNOSIS — H539 Unspecified visual disturbance: Secondary | ICD-10-CM

## 2015-05-14 NOTE — Patient Instructions (Signed)
Ignacia Bayley, NP, has recommended making the following medication changes: 1. STOP Flecainide  Your physician recommends that you schedule a follow-up appointment in 2 months with Dr Rayann Heman. Please keep your previously scheduled appointment with Dr Martinique.  If you need a refill on your cardiac medications before your next appointment, please call your pharmacy.

## 2015-05-14 NOTE — Progress Notes (Signed)
Office Visit    Patient Name: Temiloluwa Lacefield Frieling Date of Encounter: 05/14/2015  Primary Care Provider:  Jerlyn Ly, MD Primary Cardiologist:  P. Martinique, MD / Lenna Sciara. Allred, MD   Chief Complaint    82 year old female with a history of paroxysmal atrial fibrillation, hypertension, sick sinus syndrome status post pacemaker placement, aortic insufficiency, and diet-controlled diabetes, who presents for follow-up related to visual disturbance.  Past Medical History    Past Medical History  Diagnosis Date  . Moderate aortic insufficiency     a. 06/2011 Echo: EF 60-65%, mild LVH, no rwma, mod AI.  Marland Kitchen Anxiety   . Essential hypertension   . PAF (paroxysmal atrial fibrillation) (HCC)     a. chronic flecainide->reduced to 25 bid 08/2014.  . SSS (sick sinus syndrome) (HCC)     a. s/p pacemaker. b. s/p Medtronic gen change 07/2011; c.   . Type II diabetes mellitus ([place])     "controlled w/diet"  . Skin cancer ?1960's    "between breasts"  . Arthritis     "hands; between shoulders; back; feet"  . Panic attacks     "since losing husband 1994"  . Depression   . High risk medication use     on Flecainide  . Chronic anticoagulation     -->Eliquis  . Osteoporosis   . Chronic diastolic CHF (congestive heart failure) ([place])     a. 06/2011 Echo: EF 60-65%.  . Kidney stone     passed in [place] 2014  . Osteoporosis    Past Surgical History  Procedure Laterality Date  . Cesarean section  1955  . Cardiovascular stress test  12/09/2004  . Cataract extraction w/ intraocular lens  implant, bilateral  1990's  . Tonsillectomy      "school age"  . Appendectomy  1955  . Dilation and curettage of uterus  1953  . Breast cyst excision  ~ 1950    right  . Insert / replace / remove pacemaker  03/16/2006  . Skin cancer excision  ?1960's    "between my breasts"  . Permanent pacemaker generator change  08/03/2011    Procedure: PERMANENT PACEMAKER GENERATOR CHANGE;  Surgeon: Thompson Grayer, MD;  Location: [facility] CATH  LAB;  Service: Cardiovascular;;  . Breast lumpectomy with needle localization Bilateral 02/03/2014    Procedure: BILATERAL BREAST LUMPECTOMY WITH NEEDLE LOCALIZATION ON LEFT;  Surgeon: Excell Seltzer, MD;  Location: [facility];  Service: General;  Laterality: Bilateral;    Allergies  Allergies  Allergen Reactions  . Sulfonamide Derivatives Rash    "it was all over my legs"  . Citalopram Hydrobromide     Pt does not remember reaction    History of Present Illness    82 year old female with the above, plus past medical history. She has a history of paroxysmal atrial fibrillation and has been maintained on flecainide and also eliquis. Her flecainide dose was reduced to 25 mg twice a day last summer given advanced age and also no evidence of A. fib by pacemaker interrogation for the prior year. At the same time, her carvedilol dose was titrated to 6.25 mg twice a day in the setting of hypertension. She notes that ever since then, she has been experiencing intermittent episodes of visual disturbance, described as a fogginess of the vision in both of her eyes that can last several hours at a time. This [place] occur several days per week without rhyme or reason. She denies any loss of vision or double vision. She  is concerned that flecainide [place] be causing this.  She sometimes experiences associated lightheadedness but not every time. Separately, she has also noticed that when she gets out of bed in the morning, sometime she feels weak and unsteady. She has never lost consciousness.  She has not been having any palpitations and further denies any chest pain or dyspnea. She tries to remain as active as possible and uses a walker to get around. She denies PND, orthopnea, syncope, edema, or early satiety.   Home Medications    Prior to Admission medications   Medication Sig Start Date End Date Taking? Authorizing Provider  apixaban (ELIQUIS) 2.5 MG TABS tablet Take 1 tablet (2.5 mg total) by mouth 2 (two)  times daily. 01/28/14  Yes Peter M Martinique, MD  carvedilol (COREG) 6.25 MG tablet Take 1 tablet (6.25 mg total) by mouth 2 (two) times daily. 09/22/14  Yes Thompson Grayer, MD  CRESTOR 5 MG tablet Take 2.5 mg by mouth 3 (three) times a week. Monday, Wednesday and Friday 07/25/14  Yes Historical Provider, MD  diltiazem (CARDIZEM CD) 240 MG 24 hr capsule Take 1 capsule by mouth daily. 09/16/14  Yes Historical Provider, MD  flecainide (TAMBOCOR) 50 MG tablet TAKE 0.5 TABLETS (25 MG TOTAL) BY MOUTH 2 (TWO) TIMES DAILY. 01/14/15  Yes Thompson Grayer, MD  LORazepam (ATIVAN) 0.5 MG tablet Take 0.5-1 tablets by mouth 2 (two) times daily as needed. anxiety 08/28/14  Yes Historical Provider, MD  sertraline (ZOLOFT) 50 MG tablet Take 50 mg by mouth daily. 07/20/14  Yes Historical Provider, MD  Vitamin D, Ergocalciferol, (DRISDOL) 50000 UNITS CAPS capsule Take 50,000 Units by mouth every Wednesday.   Yes Historical Provider, MD  zoledronic acid (RECLAST) 5 MG/100ML SOLN injection as directed.   Yes Historical Provider, MD    Review of Systems    As above, she has been having intermittent visual, Which can last most of the day at a time, a few times a week.  She has also noticed some unsteadiness when getting up from a lying down or seated position. She has not been having chest pain, palpitations, PND, orthopnea, edema, or early satiety.  All other systems reviewed and are otherwise negative except as noted above.  Physical Exam    VS:  BP 136/76 mmHg  Pulse 83  Ht 5\' 1"  (1.549 m)  Wt 123 lb 6.4 oz (55.974 kg)  BMI 23.33 kg/m2  SpO2 94% , BMI Body mass index is 23.33 kg/(m^2).  Blood pressure lying 150/68, heart rate 68; sitting 122/66 heart rate 71; standing 118/63, heart rate 71 GEN: Well nourished, well developed, in no acute distress. HEENT: normal. Neck: Supple, no JVD, carotid bruits, or masses. Cardiac: RRR, 2/6 diastolic murmur at the upper sternal border, no rubs, or gallops. No clubbing, cyanosis, edema.   Radials/DP/PT 2+ and equal bilaterally.  Respiratory:  Respirations regular and unlabored, clear to auscultation bilaterally. GI: Soft, nontender, nondistended, BS + x 4. MS: no deformity or atrophy. Skin: warm and dry, no rash. Neuro:  Strength and sensation are intact. Psych: Normal affect.  Accessory Clinical Findings    ECG - Atrial paced, 68, first-degree AV block, no acute ST or T changes.  Assessment & Plan    1.  Visual disturbance: Patient reports a 6-8 month history of occasional blurring and fuzziness of her vision. This occurs at least once a week and can last several hours at a time and sometimes an entire day. It is generally not associated with  presyncope or other symptoms. She has not had any double vision, or complete loss of vision in any of her visual fields. She has been on flecainide for many years and her dose was reduced to 25 mg twice a day last August. She is concerned that flecainide [place] be causing her symptoms. I discussed her case with Dr. Rayann Heman. She has not had any recent palpitations and last interrogation in August 2016 did not show any A. fib for the entire prior year. In that setting, along with visual disturbance, we will plan to hold her flecainide to see if her vision changes improve. If not, I have recommended that she follow-up with her ophthalmologist.  2. Paroxysmal atrial fibrillation: Patient is in sinus rhythm today. As noted above, in the setting of a visual disturbance that has been occurring for several months, we will hold her flecainide to see if the symptoms improve. If symptoms do not improve, we will consider reinitiation of low-dose flecainide. She remains on low-dose beta blocker and diltiazem, and anticoagulated on eliquis therapy. CHA2DS2VASc equals 5.   3.  Hypertensive heart disease/orthostatic hypotension: Since her beta blocker was titrated last August, she has had intermittent spells of lightheadedness when moving from a lying or seated  to standing position. She is orthostatic by blood pressure today. I am reluctant to cut back on her beta blocker as we are having her hold her flecainide in the setting of above and I prefer not to make too many changes at once. Further, she has baseline hypertension. Instead, I recommended that she intentionally get up more slowly, allowing her feet dangle and then sitting up for a period of time at the side of the bed prior to standing. She does use a walker and recommended that she bring that closer to her bedside.  4. Moderate aortic insufficiency: This was noted on echo in 2013. She has not been having any dyspnea. Conservative management.  5. Disposition: Patient will follow up with Dr. Rayann Heman in approximately 2 months and she has a follow-up appointment scheduled with Dr. Martinique in August.   Murray Hodgkins, NP 05/14/2015, 2:46 PM

## 2015-05-26 NOTE — Addendum Note (Signed)
Addended by: Therisa Doyne on: 05/26/2015 05:16 PM   Modules accepted: Orders

## 2015-05-27 DIAGNOSIS — N390 Urinary tract infection, site not specified: Secondary | ICD-10-CM | POA: Diagnosis not present

## 2015-05-27 DIAGNOSIS — E559 Vitamin D deficiency, unspecified: Secondary | ICD-10-CM | POA: Diagnosis not present

## 2015-05-27 DIAGNOSIS — E1139 Type 2 diabetes mellitus with other diabetic ophthalmic complication: Secondary | ICD-10-CM | POA: Diagnosis not present

## 2015-05-27 DIAGNOSIS — E784 Other hyperlipidemia: Secondary | ICD-10-CM | POA: Diagnosis not present

## 2015-05-27 DIAGNOSIS — I10 Essential (primary) hypertension: Secondary | ICD-10-CM | POA: Diagnosis not present

## 2015-05-27 DIAGNOSIS — R8299 Other abnormal findings in urine: Secondary | ICD-10-CM | POA: Diagnosis not present

## 2015-06-03 ENCOUNTER — Telehealth: Payer: Self-pay | Admitting: Cardiology

## 2015-06-03 DIAGNOSIS — E11311 Type 2 diabetes mellitus with unspecified diabetic retinopathy with macular edema: Secondary | ICD-10-CM | POA: Diagnosis not present

## 2015-06-03 DIAGNOSIS — Z6822 Body mass index (BMI) 22.0-22.9, adult: Secondary | ICD-10-CM | POA: Diagnosis not present

## 2015-06-03 DIAGNOSIS — Z1231 Encounter for screening mammogram for malignant neoplasm of breast: Secondary | ICD-10-CM | POA: Diagnosis not present

## 2015-06-03 DIAGNOSIS — L89309 Pressure ulcer of unspecified buttock, unspecified stage: Secondary | ICD-10-CM | POA: Diagnosis not present

## 2015-06-03 DIAGNOSIS — R002 Palpitations: Secondary | ICD-10-CM | POA: Diagnosis not present

## 2015-06-03 DIAGNOSIS — Z0000 Encounter for general adult medical examination without abnormal findings: Secondary | ICD-10-CM | POA: Diagnosis not present

## 2015-06-03 DIAGNOSIS — I480 Paroxysmal atrial fibrillation: Secondary | ICD-10-CM | POA: Diagnosis not present

## 2015-06-03 DIAGNOSIS — E784 Other hyperlipidemia: Secondary | ICD-10-CM | POA: Diagnosis not present

## 2015-06-03 DIAGNOSIS — I10 Essential (primary) hypertension: Secondary | ICD-10-CM | POA: Diagnosis not present

## 2015-06-03 DIAGNOSIS — H3530 Unspecified macular degeneration: Secondary | ICD-10-CM | POA: Diagnosis not present

## 2015-06-03 DIAGNOSIS — Z950 Presence of cardiac pacemaker: Secondary | ICD-10-CM | POA: Diagnosis not present

## 2015-06-03 DIAGNOSIS — Z1389 Encounter for screening for other disorder: Secondary | ICD-10-CM | POA: Diagnosis not present

## 2015-06-03 NOTE — Telephone Encounter (Signed)
Received records from [facility] for appointment on 08/25/15 with Dr Martinique.  Records given to [facility] (medical records) for Dr Doug Sou schedule on 08/25/15. lp

## 2015-06-05 ENCOUNTER — Encounter: Payer: Self-pay | Admitting: Internal Medicine

## 2015-06-23 ENCOUNTER — Ambulatory Visit (INDEPENDENT_AMBULATORY_CARE_PROVIDER_SITE_OTHER): Payer: Medicare Other | Admitting: *Deleted

## 2015-06-23 DIAGNOSIS — I495 Sick sinus syndrome: Secondary | ICD-10-CM

## 2015-06-24 NOTE — Progress Notes (Signed)
Remote pacemaker transmission.   

## 2015-06-27 ENCOUNTER — Inpatient Hospital Stay (HOSPITAL_COMMUNITY): Payer: Medicare Other

## 2015-06-27 ENCOUNTER — Emergency Department (HOSPITAL_COMMUNITY): Payer: Medicare Other

## 2015-06-27 ENCOUNTER — Inpatient Hospital Stay (HOSPITAL_COMMUNITY)
Admission: EM | Admit: 2015-06-27 | Discharge: 2015-07-01 | DRG: 193 | Disposition: A | Discharge status: Skilled Nursing Facility | Payer: Medicare Other | Attending: Internal Medicine | Admitting: Internal Medicine

## 2015-06-27 ENCOUNTER — Encounter (HOSPITAL_COMMUNITY): Payer: Self-pay | Admitting: Emergency Medicine

## 2015-06-27 DIAGNOSIS — F410 Panic disorder [episodic paroxysmal anxiety] without agoraphobia: Secondary | ICD-10-CM | POA: Diagnosis present

## 2015-06-27 DIAGNOSIS — Z7901 Long term (current) use of anticoagulants: Secondary | ICD-10-CM

## 2015-06-27 DIAGNOSIS — A419 Sepsis, unspecified organism: Secondary | ICD-10-CM

## 2015-06-27 DIAGNOSIS — J9601 Acute respiratory failure with hypoxia: Secondary | ICD-10-CM | POA: Diagnosis present

## 2015-06-27 DIAGNOSIS — J189 Pneumonia, unspecified organism: Secondary | ICD-10-CM | POA: Diagnosis present

## 2015-06-27 DIAGNOSIS — R0600 Dyspnea, unspecified: Secondary | ICD-10-CM | POA: Diagnosis not present

## 2015-06-27 DIAGNOSIS — E785 Hyperlipidemia, unspecified: Secondary | ICD-10-CM | POA: Diagnosis present

## 2015-06-27 DIAGNOSIS — Z79899 Other long term (current) drug therapy: Secondary | ICD-10-CM

## 2015-06-27 DIAGNOSIS — I272 Other secondary pulmonary hypertension: Secondary | ICD-10-CM | POA: Diagnosis present

## 2015-06-27 DIAGNOSIS — F419 Anxiety disorder, unspecified: Secondary | ICD-10-CM | POA: Diagnosis present

## 2015-06-27 DIAGNOSIS — K5900 Constipation, unspecified: Secondary | ICD-10-CM | POA: Diagnosis present

## 2015-06-27 DIAGNOSIS — I5032 Chronic diastolic (congestive) heart failure: Secondary | ICD-10-CM | POA: Diagnosis present

## 2015-06-27 DIAGNOSIS — I10 Essential (primary) hypertension: Secondary | ICD-10-CM | POA: Diagnosis present

## 2015-06-27 DIAGNOSIS — I480 Paroxysmal atrial fibrillation: Secondary | ICD-10-CM | POA: Diagnosis present

## 2015-06-27 DIAGNOSIS — M6281 Muscle weakness (generalized): Secondary | ICD-10-CM | POA: Diagnosis not present

## 2015-06-27 DIAGNOSIS — R140 Abdominal distension (gaseous): Secondary | ICD-10-CM | POA: Insufficient documentation

## 2015-06-27 DIAGNOSIS — Z85828 Personal history of other malignant neoplasm of skin: Secondary | ICD-10-CM | POA: Diagnosis not present

## 2015-06-27 DIAGNOSIS — M810 Age-related osteoporosis without current pathological fracture: Secondary | ICD-10-CM | POA: Diagnosis present

## 2015-06-27 DIAGNOSIS — R278 Other lack of coordination: Secondary | ICD-10-CM | POA: Diagnosis not present

## 2015-06-27 DIAGNOSIS — F329 Major depressive disorder, single episode, unspecified: Secondary | ICD-10-CM | POA: Diagnosis present

## 2015-06-27 DIAGNOSIS — E119 Type 2 diabetes mellitus without complications: Secondary | ICD-10-CM

## 2015-06-27 DIAGNOSIS — Z882 Allergy status to sulfonamides status: Secondary | ICD-10-CM

## 2015-06-27 DIAGNOSIS — Z888 Allergy status to other drugs, medicaments and biological substances status: Secondary | ICD-10-CM

## 2015-06-27 DIAGNOSIS — R0602 Shortness of breath: Secondary | ICD-10-CM | POA: Diagnosis not present

## 2015-06-27 DIAGNOSIS — Z8679 Personal history of other diseases of the circulatory system: Secondary | ICD-10-CM

## 2015-06-27 DIAGNOSIS — J9690 Respiratory failure, unspecified, unspecified whether with hypoxia or hypercapnia: Secondary | ICD-10-CM | POA: Diagnosis not present

## 2015-06-27 DIAGNOSIS — I110 Hypertensive heart disease with heart failure: Secondary | ICD-10-CM | POA: Diagnosis present

## 2015-06-27 DIAGNOSIS — Z950 Presence of cardiac pacemaker: Secondary | ICD-10-CM | POA: Diagnosis not present

## 2015-06-27 DIAGNOSIS — E876 Hypokalemia: Secondary | ICD-10-CM | POA: Diagnosis present

## 2015-06-27 DIAGNOSIS — I351 Nonrheumatic aortic (valve) insufficiency: Secondary | ICD-10-CM | POA: Diagnosis present

## 2015-06-27 DIAGNOSIS — R2681 Unsteadiness on feet: Secondary | ICD-10-CM | POA: Diagnosis not present

## 2015-06-27 DIAGNOSIS — J9691 Respiratory failure, unspecified with hypoxia: Secondary | ICD-10-CM | POA: Diagnosis not present

## 2015-06-27 LAB — CBC WITH DIFFERENTIAL/PLATELET
Basophils Absolute: 0 10*3/uL (ref 0.0–0.1)
Basophils Relative: 0 %
EOS PCT: 0 %
Eosinophils Absolute: 0 10*3/uL (ref 0.0–0.7)
HCT: 39.1 % (ref 36.0–46.0)
Hemoglobin: 13.1 g/dL (ref 12.0–15.0)
LYMPHS ABS: 1.1 10*3/uL (ref 0.7–4.0)
LYMPHS PCT: 14 %
MCH: 30.6 pg (ref 26.0–34.0)
MCHC: 33.5 g/dL (ref 30.0–36.0)
MCV: 91.4 fL (ref 78.0–100.0)
MONO ABS: 0.2 10*3/uL (ref 0.1–1.0)
MONOS PCT: 2 %
Neutro Abs: 6.8 10*3/uL (ref 1.7–7.7)
Neutrophils Relative %: 84 %
PLATELETS: 164 10*3/uL (ref 150–400)
RBC: 4.28 MIL/uL (ref 3.87–5.11)
RDW: 13.1 % (ref 11.5–15.5)
WBC: 8 10*3/uL (ref 4.0–10.5)

## 2015-06-27 LAB — GLUCOSE, CAPILLARY: Glucose-Capillary: 314 mg/dL — ABNORMAL HIGH (ref 65–99)

## 2015-06-27 LAB — COMPREHENSIVE METABOLIC PANEL
ALT: 14 U/L (ref 14–54)
AST: 17 U/L (ref 15–41)
Albumin: 3.6 g/dL (ref 3.5–5.0)
Alkaline Phosphatase: 63 U/L (ref 38–126)
Anion gap: 8 (ref 5–15)
BUN: 17 mg/dL (ref 6–20)
CHLORIDE: 100 mmol/L — AB (ref 101–111)
CO2: 25 mmol/L (ref 22–32)
CREATININE: 0.75 mg/dL (ref 0.44–1.00)
Calcium: 9.8 mg/dL (ref 8.9–10.3)
Glucose, Bld: 264 mg/dL — ABNORMAL HIGH (ref 65–99)
Potassium: 3.6 mmol/L (ref 3.5–5.1)
Sodium: 133 mmol/L — ABNORMAL LOW (ref 135–145)
Total Bilirubin: 0.6 mg/dL (ref 0.3–1.2)
Total Protein: 6.6 g/dL (ref 6.5–8.1)

## 2015-06-27 LAB — I-STAT CG4 LACTIC ACID, ED: Lactic Acid, Venous: 0.89 mmol/L (ref 0.5–2.0)

## 2015-06-27 LAB — I-STAT TROPONIN, ED: TROPONIN I, POC: 0.02 ng/mL (ref 0.00–0.08)

## 2015-06-27 LAB — BRAIN NATRIURETIC PEPTIDE: B NATRIURETIC PEPTIDE 5: 259.2 pg/mL — AB (ref 0.0–100.0)

## 2015-06-27 MED ORDER — GUAIFENESIN ER 600 MG PO TB12
600.0000 mg | ORAL_TABLET | Freq: Two times a day (BID) | ORAL | Status: DC
  Administered 2015-06-27 – 2015-07-01 (×8): 600 mg via ORAL
  Filled 2015-06-27 (×8): qty 1

## 2015-06-27 MED ORDER — APIXABAN 2.5 MG PO TABS
2.5000 mg | ORAL_TABLET | Freq: Two times a day (BID) | ORAL | Status: DC
  Administered 2015-06-27 – 2015-07-01 (×8): 2.5 mg via ORAL
  Filled 2015-06-27 (×8): qty 1

## 2015-06-27 MED ORDER — SODIUM CHLORIDE 0.9 % IV BOLUS (SEPSIS)
500.0000 mL | Freq: Once | INTRAVENOUS | Status: AC
  Administered 2015-06-27: 500 mL via INTRAVENOUS

## 2015-06-27 MED ORDER — LORAZEPAM 0.5 MG PO TABS
0.5000 mg | ORAL_TABLET | Freq: Every day | ORAL | Status: DC
  Administered 2015-06-27 – 2015-06-30 (×4): 0.5 mg via ORAL
  Filled 2015-06-27 (×4): qty 1

## 2015-06-27 MED ORDER — MAGNESIUM HYDROXIDE 400 MG/5ML PO SUSP
15.0000 mL | Freq: Every day | ORAL | Status: DC | PRN
  Administered 2015-06-30 – 2015-07-01 (×2): 15 mL via ORAL
  Filled 2015-06-27 (×2): qty 30

## 2015-06-27 MED ORDER — ACETAMINOPHEN 325 MG PO TABS: 650.0000 mg | ORAL_TABLET | Freq: Four times a day (QID) | ORAL | Status: DC | PRN

## 2015-06-27 MED ORDER — INSULIN ASPART 100 UNIT/ML ~~LOC~~ SOLN
0.0000 [IU] | Freq: Three times a day (TID) | SUBCUTANEOUS | Status: DC
  Administered 2015-06-28: 8 [IU] via SUBCUTANEOUS
  Administered 2015-06-28: 11 [IU] via SUBCUTANEOUS

## 2015-06-27 MED ORDER — DEXTROSE 5 % IV SOLN
1.0000 g | Freq: Once | INTRAVENOUS | Status: AC
  Administered 2015-06-27: 1 g via INTRAVENOUS
  Filled 2015-06-27: qty 10

## 2015-06-27 MED ORDER — SODIUM CHLORIDE 0.9% FLUSH: 3.0000 mL | INTRAVENOUS | Status: DC | PRN

## 2015-06-27 MED ORDER — SODIUM CHLORIDE 0.9% FLUSH
3.0000 mL | Freq: Two times a day (BID) | INTRAVENOUS | Status: DC
  Administered 2015-06-28 – 2015-07-01 (×6): 3 mL via INTRAVENOUS

## 2015-06-27 MED ORDER — PREDNISONE 20 MG PO TABS
40.0000 mg | ORAL_TABLET | Freq: Every day | ORAL | Status: DC
  Administered 2015-06-28 – 2015-07-01 (×4): 40 mg via ORAL
  Filled 2015-06-27 (×4): qty 2

## 2015-06-27 MED ORDER — SODIUM CHLORIDE 0.9 % IV SOLN: 250.0000 mL | INTRAVENOUS | Status: DC | PRN

## 2015-06-27 MED ORDER — ALBUTEROL SULFATE (2.5 MG/3ML) 0.083% IN NEBU: 5.0000 mg | INHALATION_SOLUTION | Freq: Once | RESPIRATORY_TRACT | Status: DC

## 2015-06-27 MED ORDER — IPRATROPIUM-ALBUTEROL 0.5-2.5 (3) MG/3ML IN SOLN
3.0000 mL | RESPIRATORY_TRACT | Status: DC
  Administered 2015-06-27 – 2015-06-29 (×8): 3 mL via RESPIRATORY_TRACT
  Filled 2015-06-27 (×7): qty 3

## 2015-06-27 MED ORDER — IPRATROPIUM-ALBUTEROL 0.5-2.5 (3) MG/3ML IN SOLN
RESPIRATORY_TRACT | Status: AC
  Administered 2015-06-27: 3 mL
  Filled 2015-06-27: qty 3

## 2015-06-27 MED ORDER — ROSUVASTATIN CALCIUM 5 MG PO TABS
2.5000 mg | ORAL_TABLET | ORAL | Status: DC
  Administered 2015-06-29 – 2015-07-01 (×2): 2.5 mg via ORAL
  Filled 2015-06-27 (×2): qty 0.5

## 2015-06-27 MED ORDER — ONDANSETRON HCL 4 MG/2ML IJ SOLN: 4.0000 mg | Freq: Four times a day (QID) | INTRAMUSCULAR | Status: DC | PRN

## 2015-06-27 MED ORDER — DEXTROSE 5 % IV SOLN
1.0000 g | INTRAVENOUS | Status: DC
  Administered 2015-06-28 – 2015-06-30 (×3): 1 g via INTRAVENOUS
  Filled 2015-06-27 (×4): qty 10

## 2015-06-27 MED ORDER — AZITHROMYCIN 500 MG PO TABS
500.0000 mg | ORAL_TABLET | ORAL | Status: DC
  Administered 2015-06-28 – 2015-07-01 (×4): 500 mg via ORAL
  Filled 2015-06-27 (×4): qty 1

## 2015-06-27 MED ORDER — SERTRALINE HCL 50 MG PO TABS
50.0000 mg | ORAL_TABLET | Freq: Every day | ORAL | Status: DC
  Administered 2015-06-28 – 2015-07-01 (×4): 50 mg via ORAL
  Filled 2015-06-27 (×4): qty 1

## 2015-06-27 MED ORDER — DILTIAZEM HCL ER COATED BEADS 120 MG PO CP24
240.0000 mg | ORAL_CAPSULE | Freq: Every day | ORAL | Status: DC
  Administered 2015-06-28 – 2015-07-01 (×4): 240 mg via ORAL
  Filled 2015-06-27 (×4): qty 2

## 2015-06-27 MED ORDER — ACETAMINOPHEN 325 MG PO TABS
650.0000 mg | ORAL_TABLET | Freq: Once | ORAL | Status: AC
  Administered 2015-06-27: 650 mg via ORAL
  Filled 2015-06-27: qty 2

## 2015-06-27 MED ORDER — DOCUSATE SODIUM 100 MG PO CAPS
100.0000 mg | ORAL_CAPSULE | Freq: Two times a day (BID) | ORAL | Status: DC
  Administered 2015-06-27: 100 mg via ORAL
  Filled 2015-06-27: qty 1

## 2015-06-27 MED ORDER — CARVEDILOL 6.25 MG PO TABS
6.2500 mg | ORAL_TABLET | Freq: Two times a day (BID) | ORAL | Status: DC
  Administered 2015-06-28 – 2015-07-01 (×8): 6.25 mg via ORAL
  Filled 2015-06-27 (×9): qty 1

## 2015-06-27 MED ORDER — DEXTROSE 5 % IV SOLN
500.0000 mg | Freq: Once | INTRAVENOUS | Status: AC
  Administered 2015-06-27: 500 mg via INTRAVENOUS
  Filled 2015-06-27: qty 500

## 2015-06-27 MED ORDER — LORAZEPAM 0.5 MG PO TABS
0.2500 mg | ORAL_TABLET | ORAL | Status: DC
  Administered 2015-06-28 – 2015-07-01 (×6): 0.25 mg via ORAL
  Filled 2015-06-27 (×6): qty 1

## 2015-06-27 MED ORDER — INSULIN ASPART 100 UNIT/ML ~~LOC~~ SOLN
0.0000 [IU] | Freq: Every day | SUBCUTANEOUS | Status: DC
  Administered 2015-06-27: 4 [IU] via SUBCUTANEOUS

## 2015-06-27 NOTE — ED Notes (Signed)
[facility] contacted to call Code Sepsis 

## 2015-06-27 NOTE — H&P (Addendum)
History and Physical    Jo [name] MRN:[id number] DOB: 1917/07/31 DOA: 06/27/2015  PCP: Jerlyn Ly, MD   Patient coming from: Home  Chief Complaint: Cough with increased sputum production and shortness of breath  HPI: Jo [name] is a 82 y.o. woman with a history of chronic diastolic heart failure, PPM for history of sick sinus syndrome, paroxysmal atrial fibrillation anticoagulated with Eliquis, Types 2 DM, and aortic insufficiency who was brought in by EMS personnel for evaluation of hypoxia (O2 sat 86% in the field; she is not on home oxygen at baseline).  She is accompanied by her son.  She reports 3-4 days of increased cough with increased thick, white sputum production.  She had not documented fever at home, but rectal temperature in the ED was 101.  She has had progressive shortness of breath with increased wheezing.  She has had intermittent headache.  No syncope or loss of consciousness.  She has a history of constipation.  No significant nausea or vomiting but her appetite has been decreased.  No chest pain.  ED Course: ED evaluation concerning for fever and acute hypoxia.  Chest xray shows a right sided infiltrate.  Code sepsis was called.  Lactic acid is normal.  Blood pressure is stable.  She has received a 500cc NS bolus, IV Rocephin, and azithromycin.  Blood culture pending.  Hospitalist asked to admit.  Review of Systems: As per HPI otherwise 10 point review of systems negative.   Past Medical History  Diagnosis Date  . Moderate aortic insufficiency     a. 06/2011 Echo: EF 60-65%, mild LVH, no rwma, mod AI.  Marland Kitchen Anxiety   . Essential hypertension   . PAF (paroxysmal atrial fibrillation) (HCC)     a. chronic flecainide->reduced to 25 bid 08/2014.  . SSS (sick sinus syndrome) (HCC)     a. s/p pacemaker. b. s/p Medtronic gen change 07/2011; c.   . Type II diabetes mellitus ([place])     "controlled w/diet"  . Skin cancer ?1960's    "between breasts"  . Arthritis     "hands;  between shoulders; back; feet"  . Panic attacks     "since losing husband 1994"  . Depression   . High risk medication use     on Flecainide  . Chronic anticoagulation     -->Eliquis  . Osteoporosis   . Chronic diastolic CHF (congestive heart failure) ([place])     a. 06/2011 Echo: EF 60-65%.  . Kidney stone     passed in [place] 2014  . Osteoporosis     Past Surgical History  Procedure Laterality Date  . Cesarean section  1955  . Cardiovascular stress test  12/09/2004  . Cataract extraction w/ intraocular lens  implant, bilateral  1990's  . Tonsillectomy      "school age"  . Appendectomy  1955  . Dilation and curettage of uterus  1953  . Breast cyst excision  ~ 1950    right  . Insert / replace / remove pacemaker  03/16/2006  . Skin cancer excision  ?1960's    "between my breasts"  . Permanent pacemaker generator change  08/03/2011    Procedure: PERMANENT PACEMAKER GENERATOR CHANGE;  Surgeon: Thompson Grayer, MD;  Location: [facility] CATH LAB;  Service: Cardiovascular;;  . Breast lumpectomy with needle localization Bilateral 02/03/2014    Procedure: BILATERAL BREAST LUMPECTOMY WITH NEEDLE LOCALIZATION ON LEFT;  Surgeon: Excell Seltzer, MD;  Location: [facility];  Service: General;  Laterality: Bilateral;  reports that she has never smoked. She has never used smokeless tobacco. She reports that she does not drink alcohol or use illicit drugs.  She is a widow.  She still lives independently.  She ambulates with a walker at baseline.  She has two adult children.  No designated POA.  Allergies  Allergen Reactions  . Sulfonamide Derivatives Rash    "it was all over my legs"  . Citalopram Hydrobromide     Pt does not remember reaction    Family History  Problem Relation Age of Onset  . Emphysema Father     Prior to Admission medications   Medication Sig Start Date End Date Taking? Authorizing Provider  acetaminophen (TYLENOL) 325 MG tablet Take 325 mg by mouth at bedtime as needed for  moderate pain (back pain).   Yes Historical Provider, MD  apixaban (ELIQUIS) 2.5 MG TABS tablet Take 1 tablet (2.5 mg total) by mouth 2 (two) times daily. 01/28/14  Yes Peter M Martinique, MD  azelastine (ASTELIN) 0.1 % nasal spray Place 1 spray into both nostrils daily as needed for rhinitis. Use in each nostril as directed   Yes Historical Provider, MD  carvedilol (COREG) 6.25 MG tablet Take 1 tablet (6.25 mg total) by mouth 2 (two) times daily. 09/22/14  Yes Thompson Grayer, MD  CRESTOR 5 MG tablet Take 2.5 mg by mouth 3 (three) times a week. Monday, Wednesday and Friday 07/25/14  Yes Historical Provider, MD  diltiazem (CARDIZEM CD) 240 MG 24 hr capsule Take 240 mg by mouth daily.  09/16/14  Yes Historical Provider, MD  LORazepam (ATIVAN) 0.5 MG tablet Take 0.5 mg by mouth 3 (three) times daily. 0.25 mg in the morning, 0.25 mg in the afternoon, and 0.5 mg in the evening 08/28/14  Yes Historical Provider, MD  sertraline (ZOLOFT) 50 MG tablet Take 50 mg by mouth daily. 07/20/14  Yes Historical Provider, MD  Vitamin D, Ergocalciferol, (DRISDOL) 50000 UNITS CAPS capsule Take 50,000 Units by mouth every Wednesday.   Yes Historical Provider, MD  zoledronic acid (RECLAST) 5 MG/100ML SOLN injection Inject 5 mg into the muscle as directed.    Yes Historical Provider, MD    Physical Exam: Filed Vitals:   06/27/15 2100 06/27/15 2115 06/27/15 2130 06/27/15 2145  BP: 144/68 158/74 153/68 157/63  Pulse: 63 65 69 67  Temp:      TempSrc:      Resp: 11 18 18 20   SpO2: 97% 97% 97% 98%      Constitutional: NAD, calm but ill appearing; Currently on 4L [place] Filed Vitals:   06/27/15 2100 06/27/15 2115 06/27/15 2130 06/27/15 2145  BP: 144/68 158/74 153/68 157/63  Pulse: 63 65 69 67  Temp:      TempSrc:      Resp: 11 18 18 20   SpO2: 97% 97% 97% 98%   Eyes: PERRL, lids and conjunctivae normal ENMT: Mucous membranes are moist. Posterior pharynx clear of any exudate or lesions.Normal dentition.  Neck: normal,  supple Respiratory: Diffuse wheezing with bilateral crackles.  Normal respiratory effort. No accessory muscle use.  Cardiovascular: Regular rate and rhythm.  No extremity edema. 2+ pedal pulses.  Abdomen: Distended but soft and compressible.  Bowel sounds are hypoactive.    Musculoskeletal: No joint deformity upper and lower extremities. Good ROM, no contractures. Normal muscle tone.  Skin: no rashes, pale, cool, dry Neurologic: CN 2-12 grossly intact. Sensation intact, Strength 5/5 in all 4.  Psychiatric: Normal judgment and insight. Alert and oriented  x 3. Normal mood.   Labs on Admission: I have personally reviewed following labs and imaging studies  CBC:  Recent Labs Lab 06/27/15 1902  WBC 8.0  NEUTROABS 6.8  HGB 13.1  HCT 39.1  MCV 91.4  PLT 123456   Basic Metabolic Panel:  Recent Labs Lab 06/27/15 1902  NA 133*  K 3.6  CL 100*  CO2 25  GLUCOSE 264*  BUN 17  CREATININE 0.75  CALCIUM 9.8   GFR: CrCl cannot be calculated (Unknown ideal weight.). Liver Function Tests:  Recent Labs Lab 06/27/15 1902  AST 17  ALT 14  ALKPHOS 63  BILITOT 0.6  PROT 6.6  ALBUMIN 3.6   Sepsis Labs:  Lactic acid 0.89 Troponin 0.02  Radiological Exams on Admission: Dg Chest 2 View  06/27/2015  CLINICAL DATA:  Shortness of breath EXAM: CHEST  2 VIEW COMPARISON:  August 14, 2013 FINDINGS: There is new infiltrate in the right mid lung. The heart, hila, mediastinum, lungs, and pleura are otherwise unremarkable. No pneumothorax. IMPRESSION: New infiltrate in the right mid lung. Recommend follow-up to resolution. Electronically Signed   By: Dorise Bullion III M.D   On: 06/27/2015 18:15    EKG: Independently reviewed. Atrial paced.  Assessment/Plan Principal Problem:   CAP (community acquired pneumonia) Active Problems:   PAF (paroxysmal atrial fibrillation) (HCC)   History of aortic insufficiency   Hypertension   Type II diabetes mellitus (HCC)   Chronic diastolic CHF (congestive  heart failure) (HCC)   Sepsis ([place])   Pacemaker   CAP with early sepsis (fever, hypoxia) --IV rocephin and azithromycin per pharmacy --Blood culture drawn --Will check U/A due to sepsis --Will check urine legionella and streptococcal antigens --Will not give aggressive volume resuscitation since lactic acid normal and blood pressure is stable --IV solumedrol given in the ED; patient still has significant wheezing.  Will continue oral predisone and scheduled duonebs. --Supplemental oxygen at 4L [place], wean as tolerated.  The patient was not on home O2 prior to admission. --Incentive spirometer, flutter valve, mucinex BID  History of PAF --Continue home doses of coreg and cardizem --Anticoagulated with Eliquis  Abdominal distention with history of constipation --Will check KUB now --Will likely need a bowel regimen; [place] need disimpaction  Chronic diastolic heart failure --Appears compensated at this time --Telemetry monitoring  HTN --Continue Coreg, cardizem  Dyslipidemia --Continue statin  Depression --Continue Zoloft, ativan   DVT prophylaxis: Anticoagulated with Eliquis Code Status: FULL Family Communication: Son Gene at bedside in the ED Disposition Plan: Home at discharge if she recovers quickly Consults called: NONE Admission status: Inpatient, telemetry   Eber Jones MD [facility]  If 7PM-7AM, please contact night-coverage [email or web address] Password [email or web address]  06/27/2015, 10:02 PM    History of DM, hyperglycemia noted, reportedly "diet controlled" --Check A1c --SSI coverage AC/HS for now --Carb controlled diet

## 2015-06-27 NOTE — ED Provider Notes (Signed)
CSN: [id number]     Arrival date & time 06/27/15  1732 History   First MD Initiated Contact with Patient 06/27/15 1739     Chief Complaint  Patient presents with  . Shortness of Breath    Patient is a 82 y.o. female presenting with shortness of breath. The history is provided by the patient.  Shortness of Breath Severity:  Severe Onset quality:  Gradual Duration:  4 days Timing:  Constant Progression:  Worsening Chronicity:  New Relieved by:  Nothing Worsened by:  Nothing tried Ineffective treatments:  None tried Associated symptoms: cough   Associated symptoms: no abdominal pain, no chest pain, no fever, no rash, no syncope and no vomiting   Cough:    Cough characteristics:  Productive   Sputum characteristics:  White   Severity:  Severe   Onset quality:  Gradual   Duration:  4 days   Timing:  Constant   Progression:  Worsening   Chronicity:  New   Past Medical History  Diagnosis Date  . Moderate aortic insufficiency     a. 06/2011 Echo: EF 60-65%, mild LVH, no rwma, mod AI.  Marland Kitchen Anxiety   . Essential hypertension   . PAF (paroxysmal atrial fibrillation) (HCC)     a. chronic flecainide->reduced to 25 bid 08/2014.  . SSS (sick sinus syndrome) (HCC)     a. s/p pacemaker. b. s/p Medtronic gen change 07/2011; c.   . Type II diabetes mellitus ([place])     "controlled w/diet"  . Skin cancer ?1960's    "between breasts"  . Arthritis     "hands; between shoulders; back; feet"  . Panic attacks     "since losing husband 1994"  . Depression   . High risk medication use     on Flecainide  . Chronic anticoagulation     -->Eliquis  . Osteoporosis   . Chronic diastolic CHF (congestive heart failure) ([place])     a. 06/2011 Echo: EF 60-65%.  . Kidney stone     passed in [place] 2014  . Osteoporosis    Past Surgical History  Procedure Laterality Date  . Cesarean section  1955  . Cardiovascular stress test  12/09/2004  . Cataract extraction w/ intraocular lens  implant, bilateral   1990's  . Tonsillectomy      "school age"  . Appendectomy  1955  . Dilation and curettage of uterus  1953  . Breast cyst excision  ~ 1950    right  . Insert / replace / remove pacemaker  03/16/2006  . Skin cancer excision  ?1960's    "between my breasts"  . Permanent pacemaker generator change  08/03/2011    Procedure: PERMANENT PACEMAKER GENERATOR CHANGE;  Surgeon: Thompson Grayer, MD;  Location: [facility] CATH LAB;  Service: Cardiovascular;;  . Breast lumpectomy with needle localization Bilateral 02/03/2014    Procedure: BILATERAL BREAST LUMPECTOMY WITH NEEDLE LOCALIZATION ON LEFT;  Surgeon: Excell Seltzer, MD;  Location: [facility] OR;  Service: General;  Laterality: Bilateral;   Family History  Problem Relation Age of Onset  . Emphysema Father    Social History  Substance Use Topics  . Smoking status: Never Smoker   . Smokeless tobacco: Never Used  . Alcohol Use: No   OB History    No data available     Review of Systems  Constitutional: Negative for fever and chills.  HENT: Negative for congestion.   Eyes: Negative for redness.  Respiratory: Positive for cough and shortness of breath.  Cardiovascular: Negative for chest pain and syncope.  Gastrointestinal: Negative for vomiting and abdominal pain.  Genitourinary: Negative for flank pain.  Musculoskeletal: Negative for neck stiffness.  Skin: Negative for rash.  Neurological: Negative for speech difficulty.  Psychiatric/Behavioral: Negative for confusion.      Allergies  Sulfonamide derivatives and Citalopram hydrobromide  Home Medications   Prior to Admission medications   Medication Sig Start Date End Date Taking? Authorizing Provider  apixaban (ELIQUIS) 2.5 MG TABS tablet Take 1 tablet (2.5 mg total) by mouth 2 (two) times daily. 01/28/14   Peter M Martinique, MD  carvedilol (COREG) 6.25 MG tablet Take 1 tablet (6.25 mg total) by mouth 2 (two) times daily. 09/22/14   Thompson Grayer, MD  CRESTOR 5 MG tablet Take 2.5 mg by mouth 3  (three) times a week. Monday, Wednesday and Friday 07/25/14   Historical Provider, MD  diltiazem (CARDIZEM CD) 240 MG 24 hr capsule Take 1 capsule by mouth daily. 09/16/14   Historical Provider, MD  LORazepam (ATIVAN) 0.5 MG tablet Take 0.5-1 tablets by mouth 2 (two) times daily as needed. anxiety 08/28/14   Historical Provider, MD  sertraline (ZOLOFT) 50 MG tablet Take 50 mg by mouth daily. 07/20/14   Historical Provider, MD  Vitamin D, Ergocalciferol, (DRISDOL) 50000 UNITS CAPS capsule Take 50,000 Units by mouth every Wednesday.    Historical Provider, MD  zoledronic acid (RECLAST) 5 MG/100ML SOLN injection as directed.    Historical Provider, MD   BP 145/59 mmHg  Pulse 72  Resp 21  SpO2 99% Physical Exam  Constitutional: She is oriented to person, place, and time. She appears well-developed and well-nourished. No distress.  HENT:  Head: Normocephalic and atraumatic.  Right Ear: External ear normal.  Left Ear: External ear normal.  Neck: Normal range of motion.  Cardiovascular: Normal rate and regular rhythm.   Pulmonary/Chest: Tachypnea noted. She has decreased breath sounds. She has wheezes.  Requiring supplemental oxygen (2L)  Abdominal: Soft. She exhibits no distension. There is no tenderness.  Neurological: She is alert and oriented to person, place, and time.  Skin: Skin is warm and dry. She is not diaphoretic. No pallor.  Psychiatric: She has a normal mood and affect.    ED Course  Procedures (including critical care time) Labs Review Labs Reviewed  COMPREHENSIVE METABOLIC PANEL - Abnormal; Notable for the following:    Sodium 133 (*)    Chloride 100 (*)    Glucose, Bld 264 (*)    All other components within normal limits  BRAIN NATRIURETIC PEPTIDE - Abnormal; Notable for the following:    B Natriuretic Peptide 259.2 (*)    All other components within normal limits  GLUCOSE, CAPILLARY - Abnormal; Notable for the following:    Glucose-Capillary 314 (*)    All other  components within normal limits  CULTURE, BLOOD (ROUTINE X 2)  CULTURE, BLOOD (ROUTINE X 2)  CULTURE, EXPECTORATED SPUTUM-ASSESSMENT  GRAM STAIN  CBC WITH DIFFERENTIAL/PLATELET  STREP PNEUMONIAE URINARY ANTIGEN  LEGIONELLA PNEUMOPHILA SEROGP 1 UR AG  CBC  BASIC METABOLIC PANEL  HEMOGLOBIN A1C  URINALYSIS, ROUTINE W REFLEX MICROSCOPIC (NOT AT [facility])  I-STAT TROPOININ, ED  I-STAT CG4 LACTIC ACID, ED  I-STAT CG4 LACTIC ACID, ED    Imaging Review Dg Chest 2 View  06/27/2015  CLINICAL DATA:  Shortness of breath EXAM: CHEST  2 VIEW COMPARISON:  August 14, 2013 FINDINGS: There is new infiltrate in the right mid lung. The heart, hila, mediastinum, lungs, and pleura are  otherwise unremarkable. No pneumothorax. IMPRESSION: New infiltrate in the right mid lung. Recommend follow-up to resolution. Electronically Signed   By: Dorise Bullion III M.D   On: 06/27/2015 18:15   Dg Abd Portable 1v  06/27/2015  CLINICAL DATA:  Abdominal distention EXAM: PORTABLE ABDOMEN - 1 VIEW COMPARISON:  07/06/2012 abdominal CT FINDINGS: Motion degraded study. Gas scattered throughout nondilated colon and small bowel. No abnormal stool retention. No concerning intra-abdominal mass effect or calcification. Grossly clear basilar lungs. IMPRESSION: Nonobstructive bowel gas pattern. Electronically Signed   By: Monte Fantasia M.D.   On: 06/27/2015 23:51   I have personally reviewed and evaluated these images and lab results as part of my medical decision-making.   EKG Interpretation   Date/Time:  Saturday June 27 2015 17:38:36 EDT Ventricular Rate:  92 PR Interval:  180 QRS Duration: 109 QT Interval:  411 QTC Calculation: 508 R Axis:   34 Text Interpretation:  Atrial-paced rhythm New since previous tracing  Ventricular premature complex Incomplete left bundle branch block  Otherwise no significant change Confirmed by KNOTT MD, Quillian Quince [id number]) on  06/27/2015 5:50:09 PM      MDM   Final diagnoses:  Community Acquired  Pneumonia   71 showed female past medical history of sick sinus syndrome, diabetes, chronic anticoagulation on eliquis, chronic diastolic CHF, hypertension presents to the ED with 4 days of worsening shortness of breath. She reports she has overall felt bad for 4 days and has been coughing. Her shortness of breath has been progressively getting worse. Remainder of history of present illness, review of systems, exam as above.  Chest x-ray was obtained which shows an opacity concerning for pneumonia. I personally reviewed her chest x-ray. A code sepsis was called given that she needs surgical criteria and has a source (pneumonia). Broad-spectrum antibiotics were ordered to cover for community acquired pneumonia. Cultures were ordered prior to this. Remainder of her labs are essentially unremarkable.  I discussed her case with the hospitalist who admitted her to their service for further evaluation and management of her shortness of breath, further treatment of her pneumonia.  This case is managing conjunctivae attending, Dr. Laneta Simmers.    Berenice Primas, MD 06/28/15 [id number]  Leo Grosser, MD 06/28/15 [phone]

## 2015-06-27 NOTE — ED Notes (Signed)
Dr. Carter at bedside.

## 2015-06-27 NOTE — ED Notes (Signed)
Patient returned from X-ray 

## 2015-06-27 NOTE — ED Notes (Signed)
Received pt from home with c/o shortness of breath x 2-3 days, mainly at night, increasing today. Pt initial pulse ox was 86% on room air. Pt placed on NRB then had 2 duonebs and 125 of solumedrol by Ems. Pt pulse ox increased to 97% on neb treatments. Pt also c/o having loose stools. Pt denies chest pain, abdominal pain or N/V.

## 2015-06-27 NOTE — ED Notes (Signed)
[facility] contacted to page Code Stroke @ 2000

## 2015-06-27 NOTE — ED Notes (Signed)
Attempted report 

## 2015-06-27 NOTE — ED Notes (Signed)
Patient transported to X-ray 

## 2015-06-28 DIAGNOSIS — J9601 Acute respiratory failure with hypoxia: Secondary | ICD-10-CM

## 2015-06-28 LAB — BASIC METABOLIC PANEL
ANION GAP: 9 (ref 5–15)
BUN: 14 mg/dL (ref 6–20)
CHLORIDE: 106 mmol/L (ref 101–111)
CO2: 24 mmol/L (ref 22–32)
Calcium: 9.2 mg/dL (ref 8.9–10.3)
Creatinine, Ser: 0.65 mg/dL (ref 0.44–1.00)
Glucose, Bld: 337 mg/dL — ABNORMAL HIGH (ref 65–99)
POTASSIUM: 3.7 mmol/L (ref 3.5–5.1)
SODIUM: 139 mmol/L (ref 135–145)

## 2015-06-28 LAB — CBC
HCT: 38 % (ref 36.0–46.0)
HEMOGLOBIN: 12.8 g/dL (ref 12.0–15.0)
MCH: 30.6 pg (ref 26.0–34.0)
MCHC: 33.7 g/dL (ref 30.0–36.0)
MCV: 90.9 fL (ref 78.0–100.0)
PLATELETS: 138 10*3/uL — AB (ref 150–400)
RBC: 4.18 MIL/uL (ref 3.87–5.11)
RDW: 12.9 % (ref 11.5–15.5)
WBC: 4.7 10*3/uL (ref 4.0–10.5)

## 2015-06-28 LAB — GLUCOSE, CAPILLARY
GLUCOSE-CAPILLARY: 299 mg/dL — AB (ref 65–99)
GLUCOSE-CAPILLARY: 326 mg/dL — AB (ref 65–99)
Glucose-Capillary: 201 mg/dL — ABNORMAL HIGH (ref 65–99)
Glucose-Capillary: 182 mg/dL — ABNORMAL HIGH (ref 65–99)

## 2015-06-28 MED ORDER — SIMETHICONE 80 MG PO CHEW
160.0000 mg | CHEWABLE_TABLET | Freq: Once | ORAL | Status: AC
  Administered 2015-06-28: 160 mg via ORAL
  Filled 2015-06-28: qty 2

## 2015-06-28 MED ORDER — INSULIN DETEMIR 100 UNIT/ML ~~LOC~~ SOLN
10.0000 [IU] | Freq: Every day | SUBCUTANEOUS | Status: DC
  Administered 2015-06-28 – 2015-06-30 (×3): 10 [IU] via SUBCUTANEOUS
  Filled 2015-06-28 (×4): qty 0.1

## 2015-06-28 MED ORDER — HYDRALAZINE HCL 10 MG PO TABS
10.0000 mg | ORAL_TABLET | Freq: Three times a day (TID) | ORAL | Status: DC
  Administered 2015-06-28 – 2015-07-01 (×10): 10 mg via ORAL
  Filled 2015-06-28 (×10): qty 1

## 2015-06-28 MED ORDER — INSULIN ASPART 100 UNIT/ML ~~LOC~~ SOLN: 0.0000 [IU] | Freq: Every day | SUBCUTANEOUS | Status: DC

## 2015-06-28 MED ORDER — INSULIN ASPART 100 UNIT/ML ~~LOC~~ SOLN
0.0000 [IU] | Freq: Three times a day (TID) | SUBCUTANEOUS | Status: DC
  Administered 2015-06-28: 7 [IU] via SUBCUTANEOUS
  Administered 2015-06-29: 4 [IU] via SUBCUTANEOUS
  Administered 2015-06-29 (×2): 7 [IU] via SUBCUTANEOUS

## 2015-06-28 NOTE — Progress Notes (Addendum)
PROGRESS NOTE  Jo [name] [id number] DOB: Feb 16, 1917 DOA: 06/27/2015 PCP: Jerlyn Ly, MD  HPI/Recap of past 24 hours: 82 yr old female with past medical history of atrial fibrillation, aortic insufficiency, diastolic heart failure and diabetes mellitus admitted on 6/3 with respiratory failure secondary to pneumonia. Patient started on oxygen plus antibiotics and brought into the hospital.  Assessment/Plan: Principal Problem:   Acute respiratory failure with hypoxia secondary to CAP (community acquired pneumonia): Continue antibiotics, oxygen and nebulizer treatments. This is patient's first pneumonia. She is quite oriented. She states in the last couple days she was having some choking spells, so we'll check swallow evaluation to rule out dysphagia I doubt this. Active Problems:   PAF (paroxysmal atrial fibrillation) (HCC) status post pacemaker: Currently stable. On anticoagulation. Chads 2 score of 6   History of aortic insufficiency   Hypertension: Blood pressure on the rise. Monitor input and output. Have added when necessary hydralazine.   Type II diabetes mellitus ([place]): Blood sugars on the rise. Have added Lantus. Checking A1c   Chronic diastolic CHF (congestive heart failure) ([place]): BNP normal. Follow daily weights. Currently euvolemic.  Physical therapy to see.  Code Status: Full Code   Family Communication: Granddaughters at bedside   Disposition Plan: Home in a few days pending improvement    Consultants:  None   Procedures:  None   Antimicrobials:  IV Rocephin 6/3-present  IV Zithromax 6/3-present   DVT prophylaxis: Aready on Eliquis   Objective: Filed Vitals:   06/27/15 2230 06/27/15 2324 06/28/15 0442 06/28/15 0900  BP: 138/57 164/58 142/59 171/75  Pulse: 63 63 65 71  Temp:  98.1 F (36.7 C) 97.7 F (36.5 C) 97.7 F (36.5 C)  TempSrc:  Oral Oral Oral  Resp: 11 12 19 18   Weight:  55.5 kg (122 lb 5.7 oz)    SpO2: 96% 97% 98% 97%     Intake/Output Summary (Last 24 hours) at 06/28/15 1517 Last data filed at 06/28/15 0900  Gross per 24 hour  Intake   1320 ml  Output      0 ml  Net   1320 ml   Filed Weights   06/27/15 2324  Weight: 55.5 kg (122 lb 5.7 oz)    Exam:   General:  Alert & oriented x 3, NAD   Cardiovascular: RRR S1S2   Respiratory: Bilateral audible wheeze   Abdomen: Soft, distended, NT, +BS   Musculoskeletal: No clubbing, cyanosis, or edema   Skin: No skin breaks, tears or lesions  Psychiatry: Pt is appropriate, no evidence of psychosis    Data Reviewed: CBC:  Recent Labs Lab 06/27/15 1902 06/28/15 0402  WBC 8.0 4.7  NEUTROABS 6.8  --   HGB 13.1 12.8  HCT 39.1 38.0  MCV 91.4 90.9  PLT 164 0000000*   Basic Metabolic Panel:  Recent Labs Lab 06/27/15 1902 06/28/15 0402  NA 133* 139  K 3.6 3.7  CL 100* 106  CO2 25 24  GLUCOSE 264* 337*  BUN 17 14  CREATININE 0.75 0.65  CALCIUM 9.8 9.2   GFR: Estimated Creatinine Clearance: 31 mL/min (by C-G formula based on Cr of 0.65). Liver Function Tests:  Recent Labs Lab 06/27/15 1902  AST 17  ALT 14  ALKPHOS 63  BILITOT 0.6  PROT 6.6  ALBUMIN 3.6   No results for input(s): LIPASE, AMYLASE in the last 168 hours. No results for input(s): AMMONIA in the last 168 hours. Coagulation Profile: No results for input(s): INR, PROTIME  in the last 168 hours. Cardiac Enzymes: No results for input(s): CKTOTAL, CKMB, CKMBINDEX, TROPONINI in the last 168 hours. BNP (last 3 results) No results for input(s): PROBNP in the last 8760 hours. HbA1C: No results for input(s): HGBA1C in the last 72 hours. CBG:  Recent Labs Lab 06/27/15 2339 06/28/15 0743 06/28/15 1155  GLUCAP 314* 299* 326*   Lipid Profile: No results for input(s): CHOL, HDL, LDLCALC, TRIG, CHOLHDL, LDLDIRECT in the last 72 hours. Thyroid Function Tests: No results for input(s): TSH, T4TOTAL, FREET4, T3FREE, THYROIDAB in the last 72 hours. Anemia Panel: No  results for input(s): VITAMINB12, FOLATE, FERRITIN, TIBC, IRON, RETICCTPCT in the last 72 hours. Urine analysis:    Component Value Date/Time   COLORURINE YELLOW 07/06/2012 1130   APPEARANCEUR CLOUDY* 07/06/2012 1130   LABSPEC 1.014 07/06/2012 1130   PHURINE 6.5 07/06/2012 1130   GLUCOSEU >1000* 07/06/2012 1130   HGBUR MODERATE* 07/06/2012 1130   BILIRUBINUR NEGATIVE 07/06/2012 1130   KETONESUR 15* 07/06/2012 1130   PROTEINUR 30* 07/06/2012 1130   UROBILINOGEN 1.0 07/06/2012 1130   NITRITE POSITIVE* 07/06/2012 1130   LEUKOCYTESUR NEGATIVE 07/06/2012 1130   Sepsis Labs: @LABRCNTIP (procalcitonin:4,lacticidven:4)  )No results found for this or any previous visit (from the past 240 hour(s)).    Studies: Dg Chest 2 View  06/27/2015  CLINICAL DATA:  Shortness of breath EXAM: CHEST  2 VIEW COMPARISON:  August 14, 2013 FINDINGS: There is new infiltrate in the right mid lung. The heart, hila, mediastinum, lungs, and pleura are otherwise unremarkable. No pneumothorax. IMPRESSION: New infiltrate in the right mid lung. Recommend follow-up to resolution. Electronically Signed   By: Dorise Bullion III M.D   On: 06/27/2015 18:15   Dg Abd Portable 1v  06/27/2015  CLINICAL DATA:  Abdominal distention EXAM: PORTABLE ABDOMEN - 1 VIEW COMPARISON:  07/06/2012 abdominal CT FINDINGS: Motion degraded study. Gas scattered throughout nondilated colon and small bowel. No abnormal stool retention. No concerning intra-abdominal mass effect or calcification. Grossly clear basilar lungs. IMPRESSION: Nonobstructive bowel gas pattern. Electronically Signed   By: Monte Fantasia M.D.   On: 06/27/2015 23:51    Scheduled Meds: . apixaban  2.5 mg Oral BID  . azithromycin  500 mg Oral Q24H  . carvedilol  6.25 mg Oral BID WC  . cefTRIAXone (ROCEPHIN)  IV  1 g Intravenous Q24H  . diltiazem  240 mg Oral Daily  . guaiFENesin  600 mg Oral BID  . hydrALAZINE  10 mg Oral Q8H  . insulin aspart  0-20 Units Subcutaneous TID WC   . insulin aspart  0-5 Units Subcutaneous QHS  . insulin detemir  10 Units Subcutaneous QHS  . ipratropium-albuterol  3 mL Nebulization Q4H  . LORazepam  0.25 mg Oral 2 times per day  . LORazepam  0.5 mg Oral QHS  . predniSONE  40 mg Oral Q breakfast  . [START ON 06/29/2015] rosuvastatin  2.5 mg Oral Once per day on Mon Wed Fri  . sertraline  50 mg Oral Daily  . sodium chloride flush  3 mL Intravenous Q12H    Continuous Infusions:    LOS: 1 day   Time spent: 25 minutes  Annita Brod, MD [facility] Pager [phone]  If 7PM-7AM, please contact night-coverage [email or web address] Password [email or web address] 06/28/2015, 3:17 PM

## 2015-06-29 ENCOUNTER — Inpatient Hospital Stay (HOSPITAL_COMMUNITY): Payer: Medicare Other

## 2015-06-29 DIAGNOSIS — I5032 Chronic diastolic (congestive) heart failure: Secondary | ICD-10-CM

## 2015-06-29 DIAGNOSIS — J189 Pneumonia, unspecified organism: Principal | ICD-10-CM

## 2015-06-29 LAB — GLUCOSE, CAPILLARY
GLUCOSE-CAPILLARY: 248 mg/dL — AB (ref 65–99)
GLUCOSE-CAPILLARY: 213 mg/dL — AB (ref 65–99)
GLUCOSE-CAPILLARY: 182 mg/dL — AB (ref 65–99)
Glucose-Capillary: 173 mg/dL — ABNORMAL HIGH (ref 65–99)

## 2015-06-29 LAB — HEMOGLOBIN A1C
HEMOGLOBIN A1C: 7.7 % — AB (ref 4.8–5.6)
Hgb A1c MFr Bld: 7.6 % — ABNORMAL HIGH (ref 4.8–5.6)
MEAN PLASMA GLUCOSE: 174 mg/dL
Mean Plasma Glucose: 171 mg/dL

## 2015-06-29 MED ORDER — HYDROCOD POLST-CPM POLST ER 10-8 MG/5ML PO SUER: 5.0000 mL | Freq: Two times a day (BID) | ORAL | Status: DC | PRN

## 2015-06-29 MED ORDER — LEVALBUTEROL HCL 1.25 MG/0.5ML IN NEBU: 1.2500 mg | INHALATION_SOLUTION | Freq: Four times a day (QID) | RESPIRATORY_TRACT | Status: DC | PRN

## 2015-06-29 MED ORDER — IPRATROPIUM-ALBUTEROL 0.5-2.5 (3) MG/3ML IN SOLN
3.0000 mL | Freq: Four times a day (QID) | RESPIRATORY_TRACT | Status: DC
Start: 2015-06-29 — End: 2015-06-29
  Administered 2015-06-29 (×3): 3 mL via RESPIRATORY_TRACT
  Filled 2015-06-29 (×3): qty 3

## 2015-06-29 MED ORDER — IPRATROPIUM-ALBUTEROL 0.5-2.5 (3) MG/3ML IN SOLN
3.0000 mL | Freq: Three times a day (TID) | RESPIRATORY_TRACT | Status: DC
  Administered 2015-06-30 – 2015-07-01 (×5): 3 mL via RESPIRATORY_TRACT
  Filled 2015-06-29 (×5): qty 3

## 2015-06-29 NOTE — Progress Notes (Signed)
PROGRESS NOTE  Jo [name] [id number] DOB: 1917/12/15 DOA: 06/27/2015 PCP: Jerlyn Ly, MD  HPI/Recap of past 24 hours: 82 yr old female with past medical history of paroxysmal atrial fibrillation anticoagulated with Eliquis, Types 2 DM, aortic insufficiency, diastolic heart failure and diabetes mellitus admitted on 6/3 with respiratory failure secondary to pneumonia. Patient started on oxygen plus antibiotics and brought into the hospital. Initial chest x-ray shows infiltrate in the right midlung  Assessment/Plan: Principal Problem:   Acute respiratory failure with hypoxia secondary to CAP (community acquired pneumonia):  Continue antibiotics, oxygen and nebulizer treatments.   sputum culture needs to be collected urine antigens not yet collected.  She states in the last couple days she was having some choking spells, ruled out for aspiration by speech therapy Currently requiring 4 L of oxygen at rest, will repeat chest x-ray Continue Rocephin/azithromycin, day #3, continue prednisone, start prn Xopenex Suspect underlying pulmonary hypertension worsening hypoxemia Patient given written instructions about plan of care as she is HOH      PAF (paroxysmal atrial fibrillation) ([place]) status post pacemaker: Currently stable. On anticoagulation. Chads 2 score of 6    History of aortic insufficiency repeat 2-D echo    Hypertension: Blood pressure on the rise. Monitor input and output. Have added when necessary hydralazine.    Type II diabetes mellitus ([place]): Blood sugars on the rise. Continue Levemir, and sliding scale insulin. Hemoglobin A1c 7.6    Chronic diastolic CHF (congestive heart failure) without exacerbation: BNP normal. Follow daily weights. Currently euvolemic. Last 2-D echo was 07/23/11. Patient also had a component of mild pulmonary hypertension  Physical therapy SNF  Code Status: Full Code   Family Communication: Granddaughters at bedside   Disposition Plan: SNF 1-2  days    Consultants:  None   Procedures:  None   Antimicrobials:  IV Rocephin 6/3-present  IV Zithromax 6/3-present   DVT prophylaxis: Aready on Eliquis   Objective: Filed Vitals:   06/29/15 0442 06/29/15 0500 06/29/15 0725 06/29/15 0730  BP:  177/70 159/50   Pulse:  75 70   Temp:  97.8 F (36.6 C) 98 F (36.7 C)   TempSrc:   Oral   Resp:  18 18   Weight:      SpO2: 97% 97% 97% 98%    Intake/Output Summary (Last 24 hours) at 06/29/15 1235 Last data filed at 06/29/15 0900  Gross per 24 hour  Intake    810 ml  Output    800 ml  Net     10 ml   Filed Weights   06/27/15 2324 06/28/15 1933  Weight: 55.5 kg (122 lb 5.7 oz) 56.926 kg (125 lb 8 oz)    Exam:   General:  Alert & oriented x 3, NAD   Cardiovascular: RRR S1S2   Respiratory: Bilateral audible wheeze   Abdomen: Soft, distended, NT, +BS   Musculoskeletal: No clubbing, cyanosis, or edema   Skin: No skin breaks, tears or lesions  Psychiatry: Pt is appropriate, no evidence of psychosis    Data Reviewed: CBC:  Recent Labs Lab 06/27/15 1902 06/28/15 0402  WBC 8.0 4.7  NEUTROABS 6.8  --   HGB 13.1 12.8  HCT 39.1 38.0  MCV 91.4 90.9  PLT 164 0000000*   Basic Metabolic Panel:  Recent Labs Lab 06/27/15 1902 06/28/15 0402  NA 133* 139  K 3.6 3.7  CL 100* 106  CO2 25 24  GLUCOSE 264* 337*  BUN 17 14  CREATININE 0.75  0.65  CALCIUM 9.8 9.2   GFR: Estimated Creatinine Clearance: 31 mL/min (by C-G formula based on Cr of 0.65). Liver Function Tests:  Recent Labs Lab 06/27/15 1902  AST 17  ALT 14  ALKPHOS 63  BILITOT 0.6  PROT 6.6  ALBUMIN 3.6   No results for input(s): LIPASE, AMYLASE in the last 168 hours. No results for input(s): AMMONIA in the last 168 hours. Coagulation Profile: No results for input(s): INR, PROTIME in the last 168 hours. Cardiac Enzymes: No results for input(s): CKTOTAL, CKMB, CKMBINDEX, TROPONINI in the last 168 hours. BNP (last 3 results) No  results for input(s): PROBNP in the last 8760 hours. HbA1C: No results for input(s): HGBA1C in the last 72 hours. CBG:  Recent Labs Lab 06/28/15 1155 06/28/15 1619 06/28/15 1930 06/29/15 0723 06/29/15 1204  GLUCAP 326* 201* 182* 173* 248*   Lipid Profile: No results for input(s): CHOL, HDL, LDLCALC, TRIG, CHOLHDL, LDLDIRECT in the last 72 hours. Thyroid Function Tests: No results for input(s): TSH, T4TOTAL, FREET4, T3FREE, THYROIDAB in the last 72 hours. Anemia Panel: No results for input(s): VITAMINB12, FOLATE, FERRITIN, TIBC, IRON, RETICCTPCT in the last 72 hours. Urine analysis:    Component Value Date/Time   COLORURINE YELLOW 07/06/2012 1130   APPEARANCEUR CLOUDY* 07/06/2012 1130   LABSPEC 1.014 07/06/2012 1130   PHURINE 6.5 07/06/2012 1130   GLUCOSEU >1000* 07/06/2012 1130   HGBUR MODERATE* 07/06/2012 1130   BILIRUBINUR NEGATIVE 07/06/2012 1130   KETONESUR 15* 07/06/2012 1130   PROTEINUR 30* 07/06/2012 1130   UROBILINOGEN 1.0 07/06/2012 1130   NITRITE POSITIVE* 07/06/2012 1130   LEUKOCYTESUR NEGATIVE 07/06/2012 1130   Sepsis Labs: @LABRCNTIP (procalcitonin:4,lacticidven:4)  ) Recent Results (from the past 240 hour(s))  Blood culture (routine x 2)     Status: None (Preliminary result)   Collection Time: 06/27/15  6:59 PM  Result Value Ref Range Status   Specimen Description BLOOD RIGHT ANTECUBITAL  Final   Special Requests BOTTLES DRAWN AEROBIC AND ANAEROBIC 5CC  Final   Culture NO GROWTH < 24 HOURS  Final   Report Status PENDING  Incomplete  Blood culture (routine x 2)     Status: None (Preliminary result)   Collection Time: 06/27/15  7:05 PM  Result Value Ref Range Status   Specimen Description BLOOD RIGHT ANTECUBITAL  Final   Special Requests BOTTLES DRAWN AEROBIC ONLY 5CC  Final   Culture NO GROWTH < 24 HOURS  Final   Report Status PENDING  Incomplete      Studies: No results found.  Scheduled Meds: . apixaban  2.5 mg Oral BID  . azithromycin  500  mg Oral Q24H  . carvedilol  6.25 mg Oral BID WC  . cefTRIAXone (ROCEPHIN)  IV  1 g Intravenous Q24H  . diltiazem  240 mg Oral Daily  . guaiFENesin  600 mg Oral BID  . hydrALAZINE  10 mg Oral Q8H  . insulin aspart  0-20 Units Subcutaneous TID WC  . insulin aspart  0-5 Units Subcutaneous QHS  . insulin detemir  10 Units Subcutaneous QHS  . ipratropium-albuterol  3 mL Nebulization Q6H  . LORazepam  0.25 mg Oral 2 times per day  . LORazepam  0.5 mg Oral QHS  . predniSONE  40 mg Oral Q breakfast  . rosuvastatin  2.5 mg Oral Once per day on Mon Wed Fri  . sertraline  50 mg Oral Daily  . sodium chloride flush  3 mL Intravenous Q12H    Continuous Infusions:  LOS: 2 days   Time spent: 25 minutes  Reyne Dumas, MD [facility] Pager [phone]  If 7PM-7AM, please contact night-coverage [email or web address] Password [email or web address] 06/29/2015, 12:35 PM

## 2015-06-29 NOTE — Progress Notes (Signed)
Inpatient Diabetes Program Recommendations  AACE/ADA: New Consensus Statement on Inpatient Glycemic Control (2015)  Target Ranges:  Prepandial:   less than 140 mg/dL      Peak postprandial:   less than 180 mg/dL (1-2 hours)      Critically ill patients:  140 - 180 mg/dL   Results for CONSTANTINE, ISENBERG (MRN [id number]) as of 06/29/2015 12:38  Ref. Range 06/28/2015 07:43 06/28/2015 11:55 06/28/2015 16:19 06/28/2015 19:30  Glucose-Capillary Latest Ref Range: 65-99 mg/dL 299 (H) 326 (H) 201 (H) 182 (H)   Results for ARRAH, RHEAUME (MRN [id number]) as of 06/29/2015 12:38  Ref. Range 06/29/2015 07:23 06/29/2015 12:04  Glucose-Capillary Latest Ref Range: 65-99 mg/dL 173 (H) 248 (H)    Admit: Pneumonia  History: DM2, CHF  Home DM Meds: None (diet controlled)  Current Orders: Levemir 10 units QHS (started last PM)       Novolog RESISTANT SSI (0-20 units) TID AC + HS      -Note Current A1c pending.  -Patient also getting Prednisone 40 mg daily.  Eating 75-100% of meals.     MD- Please consider the following in-hospital insulin adjustments while patient getting steroids:  1. Increase Levemir slightly to 12 units QHS  2. Start low dose Novolog Meal Coverage- Novolog 3 units tid with meals      --Will follow patient during hospitalization--  Wyn Quaker RN, MSN, CDE Diabetes Coordinator Inpatient Glycemic Control Team Team Pager: [phone] (8a-5p)

## 2015-06-29 NOTE — Care Management Important Message (Signed)
Important Message  Patient Details  Name: Jo [name] MRN: [id number] Date of Birth: September 21, 1917   Medicare Important Message Given:  Yes    Loann Quill 06/29/2015, 12:04 PM

## 2015-06-29 NOTE — Clinical Social Work Note (Signed)
Clinical Social Work Assessment  Patient Details  Name: Jo [name] MRN: [id number] Date of Birth: Sep 04, 1917  Date of referral:  06/29/15               Reason for consult:  Facility Placement                Permission sought to share information with:  Family Supports Permission granted to share information::  Yes, Verbal Permission Granted  Name::     Jo [name] and Jo [name]::     Relationship::  Daughter and Son  Sport and exercise psychologist Information:  Jo [name] [id number] [phone] (cell) and Mr. Jo [name] [phone] (cell)  Housing/Transportation Living arrangements for the past 2 months:  [place] of Information:  Patient Patient Interpreter Needed:  None Criminal Activity/Legal Involvement Pertinent to Current Situation/Hospitalization:  No - Comment as needed Significant Relationships:  Other Family Members, Adult Children Lives with:  Self Do you feel safe going back to the place where you live?  Yes Need for family participation in patient care:  Yes (Comment)  Care giving concerns:  Patient reported that she lives alone and was very insightful in understanding and expressing that she needs ST rehab there is no one available to be with her 24/7 at discharge.   Social Worker assessment / plan:  CSW talked with patient regarding discharge planning and recommendation of ST rehab. Jo [name] was sitting up in the chair at bedside and was very pleasant, alert and oriented x4 and was very engaged during the conversation. Patient reported that she lives alone and her daughter Jo [name] lives in [place] and [name] lives in [place].  Patient reported that her children call her every day and check in on her but her son drives a bus for [employer] and her daughter keeps her grandbaby several days during the week, so they are not able to see her everyday. Patient reported that she gets meals on wheels and usually goes out to eat on the weekends. Jo [name] also reported that she  stopped driving a year or so ago at the urging of her children.   CSW talked with Jo [name] regarding SNF placement, and provided list for [facility]. Patient requesting Jo [name] as she has been there before and it is close to where her daughter lives.   Employment status:  Retired Forensic scientist:  Information systems manager, Manufacturing engineer) PT Recommendations:  [place] / Referral to community resources:  [place] (Patient provided with SNF list for [facility])  Patient/Family's Response to care:  Jo [name] expressed no concerns regarding her care during hospitalization.  Patient/Family's Understanding of and Emotional Response to Diagnosis, Current Treatment, and Prognosis:  Not discussed.  Emotional Assessment Appearance:  Appears younger than stated age Attitude/Demeanor/Rapport:  Other (Appropriate) Affect (typically observed):  Pleasant, Appropriate, Calm Orientation:  Oriented to Self, Oriented to Place, Oriented to  Time, Oriented to Situation Alcohol / Substance use:  Never Used Psych involvement (Current and /or in the community):  No (Comment)  Discharge Needs  Concerns to be addressed:  Discharge Planning Concerns Readmission within the last 30 days:  No Current discharge risk:  None Barriers to Discharge:  No Barriers Identified   Jo Feil, LCSW 06/29/2015, 2:49 PM

## 2015-06-29 NOTE — Evaluation (Signed)
Physical Therapy Evaluation Patient Details Name: Jo [name] MRN: [id number] DOB: [birth year] Today's Date: 06/29/2015   History of Present Illness  82 yr old female with past medical history of atrial fibrillation, aortic insufficiency, diastolic heart failure and diabetes mellitus admitted on 6/3 with respiratory failure secondary to pneumonia.  Clinical Impression   Pt admitted with above diagnosis. Pt currently with functional limitations due to the deficits listed below (see PT Problem List).  Pt will benefit from skilled PT to increase their independence and safety with mobility to allow discharge to the venue listed below.       Follow Up Recommendations SNF    Equipment Recommendations  None recommended by PT    Recommendations for Other Services       Precautions / Restrictions Precautions Precautions: Fall Precaution Comments: Watch O2 sats Restrictions Weight Bearing Restrictions: No      Mobility  Bed Mobility Overal bed mobility: Needs Assistance Bed Mobility: Supine to Sit     Supine to sit: Supervision     General bed mobility comments: Cues to initiate; used rails but no need for physical assist  Transfers Overall transfer level: Needs assistance Equipment used: Rolling walker (2 wheeled) Transfers: Sit to/from Stand Sit to Stand: Min assist         General transfer comment: Min assist to power up  Ambulation/Gait Ambulation/Gait assistance: Min assist Ambulation Distance (Feet): 40 Feet Assistive device: Rolling walker (2 wheeled) Gait Pattern/deviations: Step-through pattern;Decreased step length - right;Decreased step length - left;Decreased stance time - right     General Gait Details: mildly unsteady, min assist for safety  Stairs            Wheelchair Mobility    Modified Rankin (Stroke Patients Only)       Balance Overall balance assessment: Needs assistance           Standing balance-Leahy Scale: Poor                                Pertinent Vitals/Pain Pain Assessment: No/denies pain    Home Living Family/patient expects to be discharged to:: Private residence Living Arrangements: Alone Available Help at Discharge: Family;Available PRN/intermittently Type of Home: House Home Access: Stairs to enter   Entrance Stairs-Number of Steps: 3 Home Layout: One level Home Equipment: Walker - 4 wheels;Tub bench      Prior Function Level of Independence: Independent with assistive device(s)         Comments: Uses rollator RW; get Meals on Wheels; enjoys reading and knitting     Hand Dominance        Extremity/Trunk Assessment   Upper Extremity Assessment: Generalized weakness           Lower Extremity Assessment: Generalized weakness         Communication   Communication: HOH  Cognition Arousal/Alertness: Awake/alert Behavior During Therapy: WFL for tasks assessed/performed Overall Cognitive Status: Within Functional Limits for tasks assessed                      General Comments General comments (skin integrity, edema, etc.): Initiated walking on Room Air; throughout most of walk O2 sats remained 89% or greater; started O2 at 2 L due to pt's noted increased dyspnea; O2 sats remained greater than or equal to 94% on 2 L    Exercises        Assessment/Plan    PT  Assessment Patient needs continued PT services  PT Diagnosis Difficulty walking;Generalized weakness   PT Problem List Decreased strength;Decreased activity tolerance;Decreased balance;Decreased mobility;Decreased coordination;Decreased knowledge of use of DME;Cardiopulmonary status limiting activity  PT Treatment Interventions DME instruction;Gait training;Functional mobility training;Therapeutic activities;Therapeutic exercise;Balance training;Patient/family education   PT Goals (Current goals can be found in the Care Plan section) Acute Rehab PT Goals Patient Stated Goal: back to  normal PT Goal Formulation: With patient Time For Goal Achievement: 07/13/15 Potential to Achieve Goals: Good    Frequency Min 3X/week   Barriers to discharge        Co-evaluation               End of Session Equipment Utilized During Treatment: Gait belt;Oxygen Activity Tolerance: Patient tolerated treatment well Patient left: in chair;with call bell/phone within reach;with chair alarm set Nurse Communication: Mobility status         Time: [id number] PT Time Calculation (min) (ACUTE ONLY): 35 min   Charges:   PT Evaluation $PT Eval Moderate Complexity: 1 Procedure PT Treatments $Gait Training: 8-22 mins   PT G Codes:        Quin Hoop 06/29/2015, 10:33 AM  Roney Marion, PT  Acute Rehabilitation Services Pager [phone] Office [phone]

## 2015-06-29 NOTE — Evaluation (Signed)
Clinical/Bedside Swallow Evaluation Patient Details  Name: Jo [name] MRN: [id number] Date of Birth: [birth year]  Today's Date: 06/29/2015 Time: SLP Start Time (ACUTE ONLY): 1030 SLP Stop Time (ACUTE ONLY): 1051 SLP Time Calculation (min) (ACUTE ONLY): 21 min  Past Medical History:  Past Medical History  Diagnosis Date  . Moderate aortic insufficiency     a. 06/2011 Echo: EF 60-65%, mild LVH, no rwma, mod AI.  Marland Kitchen Anxiety   . Essential hypertension   . PAF (paroxysmal atrial fibrillation) (HCC)     a. chronic flecainide->reduced to 25 bid 08/2014.  . SSS (sick sinus syndrome) (HCC)     a. s/p pacemaker. b. s/p Medtronic gen change 07/2011; c.   . Type II diabetes mellitus ([place])     "controlled w/diet"  . Skin cancer ?1960's    "between breasts"  . Arthritis     "hands; between shoulders; back; feet"  . Panic attacks     "since losing husband 1994"  . Depression   . High risk medication use     on Flecainide  . Chronic anticoagulation     -->Eliquis  . Osteoporosis   . Chronic diastolic CHF (congestive heart failure) ([place])     a. 06/2011 Echo: EF 60-65%.  . Kidney stone     passed in [place] 2014  . Osteoporosis    Past Surgical History:  Past Surgical History  Procedure Laterality Date  . Cesarean section  1955  . Cardiovascular stress test  12/09/2004  . Cataract extraction w/ intraocular lens  implant, bilateral  1990's  . Tonsillectomy      "school age"  . Appendectomy  1955  . Dilation and curettage of uterus  1953  . Breast cyst excision  ~ 1950    right  . Insert / replace / remove pacemaker  03/16/2006  . Skin cancer excision  ?1960's    "between my breasts"  . Permanent pacemaker generator change  08/03/2011    Procedure: PERMANENT PACEMAKER GENERATOR CHANGE;  Surgeon: Thompson Grayer, MD;  Location: [facility] CATH LAB;  Service: Cardiovascular;;  . Breast lumpectomy with needle localization Bilateral 02/03/2014    Procedure: BILATERAL BREAST LUMPECTOMY WITH NEEDLE  LOCALIZATION ON LEFT;  Surgeon: Excell Seltzer, MD;  Location: [facility];  Service: General;  Laterality: Bilateral;   HPI:  82 yr old female with past medical history of atrial fibrillation, aortic insufficiency, diastolic heart failure and diabetes mellitus admitted on 6/3 with respiratory failure secondary to pneumonia. Patient started on oxygen plus antibiotics and brought into the hospital. Per MD, "This is patient's first pneumonia. She is quite oriented. She states in the last couple days she was having some choking spells, so we'll check swallow evaluation to rule out dysphagia I doubt this."   Assessment / Plan / Recommendation Clinical Impression  Pt demonstrates normal ability to eat and drink without signs of aspiration or difficutly masticating solids. She reports that prior to admission she had  A piece of meat feel lodged in her esophagus, but it passed after a few minutes. It happened again the next day, but the sensation was milder. Pt denies any coughing or choking events other than this. Recommend pt continue current diet with special care in preparation of meats, raw vegetables and dry breads. If problem persists pt should f/u with GI or an esophageal assessment. No SLP f/u needed, will sign off.     Aspiration Risk  Mild aspiration risk    Diet Recommendation Regular;Thin liquid  Liquid Administration via: Cup;Straw Medication Administration: Whole meds with liquid Supervision: Patient able to self feed Compensations: Slow rate;Small sips/bites Postural Changes: Seated upright at 90 degrees    Other  Recommendations     Follow up Recommendations  None    Frequency and Duration            Prognosis        Swallow Study   General HPI: 82 yr old female with past medical history of atrial fibrillation, aortic insufficiency, diastolic heart failure and diabetes mellitus admitted on 6/3 with respiratory failure secondary to pneumonia. Patient started on oxygen plus  antibiotics and brought into the hospital. Per MD, "This is patient's first pneumonia. She is quite oriented. She states in the last couple days she was having some choking spells, so we'll check swallow evaluation to rule out dysphagia I doubt this." Type of Study: Bedside Swallow Evaluation Previous Swallow Assessment: none Diet Prior to this Study: Regular;Thin liquids Temperature Spikes Noted: No Respiratory Status: Room air History of Recent Intubation: No Behavior/Cognition: Alert;Cooperative;Pleasant mood Oral Cavity Assessment: Within Functional Limits Oral Care Completed by SLP: No Oral Cavity - Dentition: Missing dentition;Adequate natural dentition Vision: Functional for self-feeding Self-Feeding Abilities: Able to feed self Patient Positioning: Upright in chair Baseline Vocal Quality: Normal Volitional Cough: Strong Volitional Swallow: Able to elicit    Oral/Motor/Sensory Function Overall Oral Motor/Sensory Function: Within functional limits   Ice Chips     Thin Liquid Thin Liquid: Within functional limits Presentation: Cup;Straw;Self Fed    Nectar Thick Nectar Thick Liquid: Not tested   Honey Thick Honey Thick Liquid: Not tested   Puree Puree: Within functional limits   Solid   GO   Solid: Within functional limits       [facility], MA CCC-SLP [id number]  Jo [name], Katherene Ponto 06/29/2015,11:57 AM

## 2015-06-29 NOTE — Clinical Social Work Placement (Signed)
   CLINICAL SOCIAL WORK PLACEMENT  NOTE  Date:  06/29/2015  Patient Details  Name: Jo [name] MRN: [id number] Date of Birth: 27-Nov-1917  Clinical Social Work is seeking post-discharge placement for this patient at the [place] level of care (*CSW will initial, date and re-position this form in  chart as items are completed):  Yes   Patient/family provided with [place] Work Department's list of facilities offering this level of care within the geographic area requested by the patient (or if unable, by the patient's family).  Yes   Patient/family informed of their freedom to choose among providers that offer the needed level of care, that participate in Medicare, Medicaid or managed care program needed by the patient, have an available bed and are willing to accept the patient.  Yes   Patient/family informed of [facility]'s ownership interest in [facility] and [facility], as well as of the fact that they are under no obligation to receive care at these facilities.  PASRR submitted to EDS on       PASRR number received on       Existing PASRR number confirmed on 06/29/15     FL2 transmitted to all facilities in geographic area requested by pt/family on 06/29/15     FL2 transmitted to all facilities within larger geographic area on       Patient informed that his/her managed care company has contracts with or will negotiate with certain facilities, including the following:            Patient/family informed of bed offers received.  Patient chooses bed at       Physician recommends and patient chooses bed at      Patient to be transferred to   on  .  Patient to be transferred to facility by       Patient family notified on   of transfer.  Name of family member notified:        PHYSICIAN       Additional Comment:    _______________________________________________ Sable Feil, LCSW 06/29/2015, 3:30 PM

## 2015-06-29 NOTE — Consult Note (Signed)
   [facility] CM Inpatient Consult   06/29/2015  Jo [name] [place] 1917/10/01 [id number]   Patient screened for potential [facility] Care Management services. Chart reviewed. Noted discharge plan is for SNF at this time. Spoke with inpatient Licensed CSW to confirm.  If patient's post hospital needs change, please place a [facility] Care Management consult. For questions please contact:  Marthenia Rolling, [place], RN,BSN [facility] Liaison [phone]

## 2015-06-29 NOTE — NC FL2 (Signed)
[place] LEVEL OF CARE SCREENING TOOL     IDENTIFICATION  Patient Name: Jo [name] Birthdate: 1917/09/24 Sex: female Admission Date (Current Location): 06/27/2015  [facility] and [place] Number:  Herbalist and Address:  [facility] [place] [place] [address], [place], [place] [address]      Provider Number: [id number]  Attending Physician Name and Address:  Reyne Dumas, MD  Relative Name and Phone Number:  Chrystie Nose - Daughter.  [phone] (cell) and [phone] (home)    Current Level of Care: SNF Recommended Level of Care: [place] Prior Approval Number:    Date Approved/Denied:   PASRR Number: [id number] (Eff. 07/10/12)  Discharge Plan: SNF    Current Diagnoses: Patient Active Problem List   Diagnosis Date Noted  . CAP (community acquired pneumonia) 06/27/2015  . Sepsis ([place]) 06/27/2015  . Pacemaker 06/27/2015  . Moderate aortic insufficiency   . Chronic diastolic CHF (congestive heart failure) ([place])   . Breast cancer ([place]) 02/03/2014  . Dyslipidemia 08/15/2012  . Atrophic vaginitis 08/15/2012  . Unspecified constipation 08/15/2012  . Vitamin D intoxication 08/15/2012  . Gram negative septicemia ([place]) 07/07/2012  . Acute on chronic diastolic HF (heart failure) ([place]) 07/06/2012  . Atrial fibrillation with RVR ([place]) 07/21/2011  . Type II diabetes mellitus ([place])   . Panic attacks   . Anxiety   . PAF (paroxysmal atrial fibrillation) ([place])   . SSS (sick sinus syndrome) ([place])   . History of aortic insufficiency   . Hypertension     Orientation RESPIRATION BLADDER Height & Weight     Self, Time, Situation, Place  Normal Incontinent Weight: 125 lb 8 oz (56.926 kg) Height:     BEHAVIORAL SYMPTOMS/MOOD NEUROLOGICAL BOWEL NUTRITION STATUS      Continent Diet (Heart healthy)  AMBULATORY STATUS COMMUNICATION OF NEEDS Skin   Limited Assist (Walked 40 ft with a 2-wheeled rolling walker) Verbally Normal                        Personal Care Assistance Level of Assistance  Bathing, Feeding, Dressing Bathing Assistance: Limited assistance Feeding assistance: Independent Dressing Assistance: Limited assistance     Functional Limitations Info  Sight, Hearing, Speech Sight Info: Adequate Hearing Info: Impaired (Impaired in both ears) Speech Info: Adequate    SPECIAL CARE FACTORS FREQUENCY  PT (By licensed PT), Speech therapy     PT Frequency: Evaluated and a minimum of 3X per week recommended       Speech Therapy Frequency: Evaluated 6/5      Contractures Contractures Info: Not present    Additional Factors Info  Code Status, Allergies, Insulin Sliding Scale Code Status Info: Full Allergies Info: Sulfonamide derivatives, Citalopram Hydrobromide   Insulin Sliding Scale Info: Insulin 0-5 Units at bedtime.  Insulin 0-20 Units 3X per day with meals.       Current Medications (06/29/2015):  This is the current hospital active medication list Current Facility-Administered Medications  Medication Dose Route Frequency Provider Last Rate Last Dose  . 0.9 %  sodium chloride infusion  250 mL Intravenous PRN Lily Kocher, MD      . acetaminophen (TYLENOL) tablet 650 mg  650 mg Oral Q6H PRN Lily Kocher, MD      . apixaban Arne [place]) tablet 2.5 mg  2.5 mg Oral BID Lily Kocher, MD   2.5 mg at 06/29/15 1058  . azithromycin (ZITHROMAX) tablet 500 mg  500 mg Oral Q24H  Lily Kocher, MD   500 mg at 06/29/15 1058  . carvedilol (COREG) tablet 6.25 mg  6.25 mg Oral BID WC Lily Kocher, MD   6.25 mg at 06/29/15 0835  . cefTRIAXone (ROCEPHIN) 1 g in dextrose 5 % 50 mL IVPB  1 g Intravenous Q24H Lily Kocher, MD   1 g at 06/28/15 2015  . chlorpheniramine-HYDROcodone (TUSSIONEX) 10-8 MG/5ML suspension 5 mL  5 mL Oral Q12H PRN Reyne Dumas, MD      . diltiazem (CARDIZEM CD) 24 hr capsule 240 mg  240 mg Oral Daily Lily Kocher, MD   240 mg at 06/29/15 1058  . guaiFENesin (MUCINEX) 12 hr tablet 600 mg   600 mg Oral BID Lily Kocher, MD   600 mg at 06/29/15 1058  . hydrALAZINE (APRESOLINE) tablet 10 mg  10 mg Oral Q8H Annita Brod, MD   10 mg at 06/29/15 0640  . insulin aspart (novoLOG) injection 0-20 Units  0-20 Units Subcutaneous TID WC Annita Brod, MD   7 Units at 06/29/15 1240  . insulin aspart (novoLOG) injection 0-5 Units  0-5 Units Subcutaneous QHS Annita Brod, MD   0 Units at 06/28/15 2231  . insulin detemir (LEVEMIR) injection 10 Units  10 Units Subcutaneous QHS Annita Brod, MD   10 Units at 06/28/15 2230  . ipratropium-albuterol (DUONEB) 0.5-2.5 (3) MG/3ML nebulizer solution 3 mL  3 mL Nebulization Q6H Annita Brod, MD   3 mL at 06/29/15 0730  . levalbuterol (XOPENEX) nebulizer solution 1.25 mg  1.25 mg Nebulization Q6H PRN Reyne Dumas, MD      . LORazepam (ATIVAN) tablet 0.25 mg  0.25 mg Oral 2 times per day Lily Kocher, MD   0.25 mg at 06/29/15 0835  . LORazepam (ATIVAN) tablet 0.5 mg  0.5 mg Oral QHS Lily Kocher, MD   0.5 mg at 06/28/15 2230  . magnesium hydroxide (MILK OF MAGNESIA) suspension 15 mL  15 mL Oral Daily PRN Lily Kocher, MD      . ondansetron [facility]) injection 4 mg  4 mg Intravenous Q6H PRN Lily Kocher, MD      . predniSONE (DELTASONE) tablet 40 mg  40 mg Oral Q breakfast Lily Kocher, MD   40 mg at 06/29/15 0835  . rosuvastatin (CRESTOR) tablet 2.5 mg  2.5 mg Oral Once per day on Mon Wed Fri Lily Kocher, MD   2.5 mg at 06/29/15 [id number]  . sertraline (ZOLOFT) tablet 50 mg  50 mg Oral Daily Lily Kocher, MD   50 mg at 06/29/15 1058  . sodium chloride flush (NS) 0.9 % injection 3 mL  3 mL Intravenous Q12H Lily Kocher, MD   3 mL at 06/29/15 1059  . sodium chloride flush (NS) 0.9 % injection 3 mL  3 mL Intravenous PRN Lily Kocher, MD         Discharge Medications: Please see discharge summary for a list of discharge medications.  Relevant Imaging Results:  Relevant Lab Results:   Additional Information ss#[id number].   Sable Feil, LCSW

## 2015-06-30 DIAGNOSIS — R140 Abdominal distension (gaseous): Secondary | ICD-10-CM | POA: Insufficient documentation

## 2015-06-30 LAB — CBC
HEMATOCRIT: 39.2 % (ref 36.0–46.0)
Hemoglobin: 12.6 g/dL (ref 12.0–15.0)
MCH: 29.4 pg (ref 26.0–34.0)
MCHC: 32.1 g/dL (ref 30.0–36.0)
MCV: 91.4 fL (ref 78.0–100.0)
Platelets: 189 10*3/uL (ref 150–400)
RBC: 4.29 MIL/uL (ref 3.87–5.11)
RDW: 13.2 % (ref 11.5–15.5)
WBC: 14.1 10*3/uL — AB (ref 4.0–10.5)

## 2015-06-30 LAB — COMPREHENSIVE METABOLIC PANEL
ALBUMIN: 3.2 g/dL — AB (ref 3.5–5.0)
ALT: 13 U/L — AB (ref 14–54)
AST: 16 U/L (ref 15–41)
Alkaline Phosphatase: 54 U/L (ref 38–126)
Anion gap: 9 (ref 5–15)
BILIRUBIN TOTAL: 0.6 mg/dL (ref 0.3–1.2)
BUN: 19 mg/dL (ref 6–20)
CHLORIDE: 101 mmol/L (ref 101–111)
CO2: 28 mmol/L (ref 22–32)
CREATININE: 0.65 mg/dL (ref 0.44–1.00)
Calcium: 9.3 mg/dL (ref 8.9–10.3)
GFR calc Af Amer: >60 mL/min (ref >60)
GFR calc non Af Amer: >60 mL/min (ref >60)
GLUCOSE: 129 mg/dL — AB (ref 65–99)
POTASSIUM: 3.3 mmol/L — AB (ref 3.5–5.1)
Sodium: 138 mmol/L (ref 135–145)
Total Protein: 6 g/dL — ABNORMAL LOW (ref 6.5–8.1)

## 2015-06-30 LAB — URINALYSIS, ROUTINE W REFLEX MICROSCOPIC
Bilirubin Urine: NEGATIVE
GLUCOSE, UA: NEGATIVE mg/dL
HGB URINE DIPSTICK: NEGATIVE
KETONES UR: NEGATIVE mg/dL
LEUKOCYTES UA: NEGATIVE
Nitrite: NEGATIVE
PROTEIN: NEGATIVE mg/dL
Specific Gravity, Urine: 1.014 (ref 1.005–1.030)
pH: 7 (ref 5.0–8.0)

## 2015-06-30 LAB — GLUCOSE, CAPILLARY
GLUCOSE-CAPILLARY: 186 mg/dL — AB (ref 65–99)
Glucose-Capillary: 112 mg/dL — ABNORMAL HIGH (ref 65–99)
Glucose-Capillary: 358 mg/dL — ABNORMAL HIGH (ref 65–99)
Glucose-Capillary: 307 mg/dL — ABNORMAL HIGH (ref 65–99)

## 2015-06-30 LAB — STREP PNEUMONIAE URINARY ANTIGEN: STREP PNEUMO URINARY ANTIGEN: NEGATIVE

## 2015-06-30 MED ORDER — FUROSEMIDE 10 MG/ML IJ SOLN
40.0000 mg | Freq: Once | INTRAMUSCULAR | Status: AC
  Administered 2015-06-30: 40 mg via INTRAVENOUS
  Filled 2015-06-30: qty 4

## 2015-06-30 MED ORDER — POTASSIUM CHLORIDE CRYS ER 20 MEQ PO TBCR
40.0000 meq | EXTENDED_RELEASE_TABLET | Freq: Once | ORAL | Status: AC
  Administered 2015-06-30: 40 meq via ORAL
  Filled 2015-06-30: qty 2

## 2015-06-30 MED ORDER — INSULIN ASPART 100 UNIT/ML ~~LOC~~ SOLN
0.0000 [IU] | Freq: Three times a day (TID) | SUBCUTANEOUS | Status: DC
  Administered 2015-06-30: 15 [IU] via SUBCUTANEOUS
  Administered 2015-06-30: 3 [IU] via SUBCUTANEOUS
  Administered 2015-07-01: 8 [IU] via SUBCUTANEOUS
  Administered 2015-07-01: 7 [IU] via SUBCUTANEOUS

## 2015-06-30 NOTE — Progress Notes (Addendum)
Inpatient Diabetes Program Recommendations  AACE/ADA: New Consensus Statement on Inpatient Glycemic Control (2015)  Target Ranges:  Prepandial:   less than 140 mg/dL      Peak postprandial:   less than 180 mg/dL (1-2 hours)      Critically ill patients:  140 - 180 mg/dL   Results for Jo [name], Jo [name] (MRN [id number]) as of 06/30/2015 08:38  Ref. Range 06/29/2015 07:23 06/29/2015 12:04 06/29/2015 16:03 06/29/2015 19:26  Glucose-Capillary Latest Ref Range: 65-99 mg/dL 173 (H) 248 (H) 213 (H) 182 (H)   Results for Jo [name], Jo [name] (MRN [id number]) as of 06/30/2015 08:38  Ref. Range 06/28/2015 13:37  Hemoglobin A1C Latest Ref Range: 4.8-5.6 % 7.6 (H)    Admit: Pneumonia  History: DM2, CHF  Home DM Meds: None (diet controlled)  Current Orders: Levemir 10 units QHS (started last PM)   Novolog RESISTANT SSI (0-20 units) TID AC + HS      -Note Current A1c shows good control at home= 7.6%.    -Patient also getting Prednisone 40 mg daily. Eating 75-100% of meals.     MD- Please consider the following in-hospital insulin adjustments while patient getting steroids:  1. Start low dose Novolog Meal Coverage- Novolog 3 units tid with meals  2. Reduce Novolog Correction Scale/ SSI to Moderate scale (0-15 units) TID AC + HS (currently ordered as Resistant scale 0-20 units)      --Will follow patient during hospitalization--  Wyn Quaker RN, MSN, CDE Diabetes Coordinator Inpatient Glycemic Control Team Team Pager: [phone] (8a-5p)

## 2015-06-30 NOTE — Progress Notes (Signed)
Physical Therapy Treatment Patient Details Name: Jo [name] MRN: [id number] DOB: [birth year] Today's Date: 06/30/2015    History of Present Illness 82 yr old female with past medical history of atrial fibrillation, aortic insufficiency, diastolic heart failure and diabetes mellitus admitted on 6/3 with respiratory failure secondary to pneumonia.    PT Comments    Overall progressing well; Anticipate continuing good progress at post-acute rehabilitation.  Follow Up Recommendations  SNF     Equipment Recommendations  None recommended by PT    Recommendations for Other Services       Precautions / Restrictions Precautions Precautions: Fall Precaution Comments: Watch O2 sats    Mobility  Bed Mobility Overal bed mobility: Needs Assistance Bed Mobility: Supine to Sit;Sit to Supine     Supine to sit: Supervision Sit to supine: Supervision   General bed mobility comments: Cues to initiate; used rails but no need for physical assist  Transfers Overall transfer level: Needs assistance Equipment used: Rolling walker (2 wheeled) Transfers: Sit to/from Stand Sit to Stand: Min guard         General transfer comment: Min guard assist for safety  Ambulation/Gait Ambulation/Gait assistance: Min guard Ambulation Distance (Feet): 60 Feet Assistive device: Rolling walker (2 wheeled) Gait Pattern/deviations: Step-through pattern;Decreased step length - right;Decreased step length - left;Decreased stride length     General Gait Details: Cues to self-monitor for activiyt tolerance   Stairs            Wheelchair Mobility    Modified Rankin (Stroke Patients Only)       Balance             Standing balance-Leahy Scale: Fair                      Cognition Arousal/Alertness: Awake/alert Behavior During Therapy: WFL for tasks assessed/performed Overall Cognitive Status: Within Functional Limits for tasks assessed                       Exercises      General Comments General comments (skin integrity, edema, etc.): Initiated walking on Room Air; throughout most of walk O2 sats remained 89% or greater; started O2 at 2 L due to pt's noted increased dyspnea; O2 sats remained greater than or equal to 94% on 2 L      Pertinent Vitals/Pain Pain Assessment: No/denies pain    Home Living                      Prior Function            PT Goals (current goals can now be found in the care plan section) Acute Rehab PT Goals Patient Stated Goal: back to normal PT Goal Formulation: With patient Time For Goal Achievement: 07/13/15 Potential to Achieve Goals: Good Progress towards PT goals: Progressing toward goals    Frequency  Min 3X/week    PT Plan Current plan remains appropriate    Co-evaluation             End of Session Equipment Utilized During Treatment: Gait belt;Oxygen Activity Tolerance: Patient tolerated treatment well Patient left: in bed;with call bell/phone within reach;with family/visitor present     Time: 1544-1600 PT Time Calculation (min) (ACUTE ONLY): 16 min  Charges:  $Gait Training: 8-22 mins                    G Codes:  Roney Marion Hamff 06/30/2015, 4:29 PM  Roney Marion, [place] Pager [phone] Office [phone]

## 2015-06-30 NOTE — Progress Notes (Addendum)
PROGRESS NOTE  Jo [name] [id number] DOB: December 15, 1917 DOA: 06/27/2015 PCP: Jerlyn Ly, MD  HPI/Recap of past 24 hours: 82 yr old female with past medical history of paroxysmal atrial fibrillation anticoagulated with Eliquis, Types 2 DM, aortic insufficiency, diastolic heart failure and diabetes mellitus admitted on 6/3 with respiratory failure secondary to pneumonia. Patient started on oxygen plus antibiotics and brought into the hospital. Initial chest x-ray shows infiltrate in the right midlung  Assessment/Plan:     Acute respiratory failure with hypoxia secondary to CAP (community acquired pneumonia):  Continue antibiotics, oxygen and nebulizer treatments.   sputum culture no growth so far, strep pneumo urine antigen negative, legionella antigen in process, She states in the last couple days she was having some choking spells, ruled out for aspiration by speech therapy Currently requiring 3-4 L of oxygen at rest, repeat chest x-ray shows slight worsening, [place] need outpatient CT chest to ensure resolution and rule out malignancy Continue Rocephin/azithromycin, day #4, continue prednisone, start prn Xopenex Suspect underlying pulmonary hypertension worsening hypoxemia, 2-D echo pending Patient given written instructions about plan of care as she is [facility] and discussed with the son   Hypokalemia-replete    PAF (paroxysmal atrial fibrillation) ([place]) status post pacemaker: Currently stable. On anticoagulation. Chads 2 score of 6    History of aortic insufficiency repeat 2-D echo    Hypertension: Blood pressure on the rise. Monitor input and output. Have added when necessary hydralazine.    Type II diabetes mellitus ([place]): Blood sugars on the rise. Continue Levemir, and sliding scale insulin. Hemoglobin A1c 7.6    Chronic diastolic CHF (congestive heart failure) without exacerbation: BNP normal. Follow daily weights. Currently euvolemic. Last 2-D echo was 07/23/11. Patient also had a  component of mild pulmonary hypertension, will give one dose of lasix 40 mg iv  To help wean her oxygen    Physical therapy SNF  Code Status: Full Code   Family Communication: son  by the bedside  Disposition Plan: SNF 6/ 7 if stable, continue to wean oxygen    Consultants:  None   Procedures:  None   Antimicrobials:  IV Rocephin 6/3-present  IV Zithromax 6/3-present   DVT prophylaxis: [place] on Eliquis    Objective: Filed Vitals:   06/30/15 0536 06/30/15 0600 06/30/15 0800 06/30/15 0859  BP: 169/70  152/71   Pulse: 70  65   Temp: 98.6 F (37 C)  97.4 F (36.3 C)   TempSrc: Oral  Oral   Resp: 18     Height:  5\' 1"  (1.549 m)    Weight:      SpO2: 95%   97%    Intake/Output Summary (Last 24 hours) at 06/30/15 1047 Last data filed at 06/30/15 0957  Gross per 24 hour  Intake    649 ml  Output    950 ml  Net   -301 ml   Filed Weights   06/27/15 2324 06/28/15 1933 06/29/15 1929  Weight: 55.5 kg (122 lb 5.7 oz) 56.926 kg (125 lb 8 oz) 55.929 kg (123 lb 4.8 oz)    Exam:   General:  Alert & oriented x 3, NAD   Cardiovascular: RRR S1S2   Respiratory: Bilateral audible wheeze   Abdomen: Soft, distended, NT, +BS   Musculoskeletal: No clubbing, cyanosis, or edema   Skin: No skin breaks, tears or lesions  Psychiatry: Pt is appropriate, no evidence of psychosis    Data Reviewed: CBC:  Recent Labs Lab 06/27/15 1902 06/28/15 0402  06/30/15 0647  WBC 8.0 4.7 14.1*  NEUTROABS 6.8  --   --   HGB 13.1 12.8 12.6  HCT 39.1 38.0 39.2  MCV 91.4 90.9 91.4  PLT 164 138* [id number]   Basic Metabolic Panel:  Recent Labs Lab 06/27/15 1902 06/28/15 0402 06/30/15 0647  NA 133* 139 138  K 3.6 3.7 3.3*  CL 100* 106 101  CO2 25 24 28   GLUCOSE 264* 337* 129*  BUN 17 14 19   CREATININE 0.75 0.65 0.65  CALCIUM 9.8 9.2 9.3   GFR: Estimated Creatinine Clearance: 31 mL/min (by C-G formula based on Cr of 0.65). Liver Function Tests:  Recent Labs Lab  06/27/15 1902 06/30/15 0647  AST 17 16  ALT 14 13*  ALKPHOS 63 54  BILITOT 0.6 0.6  PROT 6.6 6.0*  ALBUMIN 3.6 3.2*   No results for input(s): LIPASE, AMYLASE in the last 168 hours. No results for input(s): AMMONIA in the last 168 hours. Coagulation Profile: No results for input(s): INR, PROTIME in the last 168 hours. Cardiac Enzymes: No results for input(s): CKTOTAL, CKMB, CKMBINDEX, TROPONINI in the last 168 hours. BNP (last 3 results) No results for input(s): PROBNP in the last 8760 hours. HbA1C:  Recent Labs  06/28/15 0402 06/28/15 1337  HGBA1C 7.7* 7.6*   CBG:  Recent Labs Lab 06/29/15 0723 06/29/15 1204 06/29/15 1603 06/29/15 1926 06/30/15 0744  GLUCAP 173* 248* 213* 182* 112*   Lipid Profile: No results for input(s): CHOL, HDL, LDLCALC, TRIG, CHOLHDL, LDLDIRECT in the last 72 hours. Thyroid Function Tests: No results for input(s): TSH, T4TOTAL, FREET4, T3FREE, THYROIDAB in the last 72 hours. Anemia Panel: No results for input(s): VITAMINB12, FOLATE, FERRITIN, TIBC, IRON, RETICCTPCT in the last 72 hours. Urine analysis:    Component Value Date/Time   COLORURINE YELLOW 06/30/2015 [place] 06/30/2015 0639   LABSPEC 1.014 06/30/2015 0639   PHURINE 7.0 06/30/2015 0639   GLUCOSEU NEGATIVE 06/30/2015 0639   HGBUR NEGATIVE 06/30/2015 0639   BILIRUBINUR NEGATIVE 06/30/2015 0639   KETONESUR NEGATIVE 06/30/2015 0639   PROTEINUR NEGATIVE 06/30/2015 0639   UROBILINOGEN 1.0 07/06/2012 1130   NITRITE NEGATIVE 06/30/2015 0639   LEUKOCYTESUR NEGATIVE 06/30/2015 0639   Sepsis Labs: @LABRCNTIP (procalcitonin:4,lacticidven:4)  ) Recent Results (from the past 240 hour(s))  Blood culture (routine x 2)     Status: None (Preliminary result)   Collection Time: 06/27/15  6:59 PM  Result Value Ref Range Status   Specimen Description BLOOD RIGHT ANTECUBITAL  Final   Special Requests BOTTLES DRAWN AEROBIC AND ANAEROBIC 5CC  Final   Culture NO GROWTH 2  DAYS  Final   Report Status PENDING  Incomplete  Blood culture (routine x 2)     Status: None (Preliminary result)   Collection Time: 06/27/15  7:05 PM  Result Value Ref Range Status   Specimen Description BLOOD RIGHT ANTECUBITAL  Final   Special Requests BOTTLES DRAWN AEROBIC ONLY 5CC  Final   Culture NO GROWTH 2 DAYS  Final   Report Status PENDING  Incomplete      Studies: Dg Chest Port 1 View  06/29/2015  CLINICAL DATA:  Recent admission for respiratory failure and pneumonia. EXAM: PORTABLE CHEST 1 VIEW COMPARISON:  06/27/2015. FINDINGS: Trachea is midline. Heart size stable. Pacemaker lead tips object over the right atrium and right ventricle. There is airspace opacification in the right upper lobe, similar. Minimal streaky atelectasis or scarring in the left lower lobe. No pleural fluid. IMPRESSION: Patchy airspace opacification in  the right upper lobe [place] be due to pneumonia. Followup PA and lateral chest X-ray is recommended in 3-4 weeks following trial of antibiotic therapy to ensure resolution and exclude underlying malignancy. Electronically Signed   By: Lorin Picket M.D.   On: 06/29/2015 16:47    Scheduled Meds: . apixaban  2.5 mg Oral BID  . azithromycin  500 mg Oral Q24H  . carvedilol  6.25 mg Oral BID WC  . cefTRIAXone (ROCEPHIN)  IV  1 g Intravenous Q24H  . diltiazem  240 mg Oral Daily  . guaiFENesin  600 mg Oral BID  . hydrALAZINE  10 mg Oral Q8H  . insulin aspart  0-15 Units Subcutaneous TID WC  . insulin detemir  10 Units Subcutaneous QHS  . ipratropium-albuterol  3 mL Nebulization TID  . LORazepam  0.25 mg Oral 2 times per day  . LORazepam  0.5 mg Oral QHS  . predniSONE  40 mg Oral Q breakfast  . rosuvastatin  2.5 mg Oral Once per day on Mon Wed Fri  . sertraline  50 mg Oral Daily  . sodium chloride flush  3 mL Intravenous Q12H    Continuous Infusions:    LOS: 3 days   Time spent: 25 minutes  Reyne Dumas, MD [facility] Pager [phone]  If  7PM-7AM, please contact night-coverage [email or web address] Password [facility] 06/30/2015, 10:47 AM

## 2015-06-30 NOTE — Clinical Social Work Placement (Addendum)
   CLINICAL SOCIAL WORK PLACEMENT  NOTE 07/01/15 - DISCHARGE TO [facility]   Date:  06/30/2015  Patient Details  Name: Jo [name] MRN: [id number] Date of Birth: [birth year]  Clinical Social Work is seeking post-discharge placement for this patient at the [place] level of care (*CSW will initial, date and re-position this form in  chart as items are completed):  Yes   Patient/family provided with [place] Work Department's list of facilities offering this level of care within the geographic area requested by the patient (or if unable, by the patient's family).  Yes   Patient/family informed of their freedom to choose among providers that offer the needed level of care, that participate in Medicare, Medicaid or managed care program needed by the patient, have an available bed and are willing to accept the patient.  Yes   Patient/family informed of [facility]'s ownership interest in [facility] and [facility], as well as of the fact that they are under no obligation to receive care at these facilities.  PASRR submitted to EDS on       PASRR number received on       Existing PASRR number confirmed on 06/29/15     FL2 transmitted to all facilities in geographic area requested by pt/family on 06/29/15     FL2 transmitted to all facilities within larger geographic area on       Patient informed that his/her managed care company has contracts with or will negotiate with certain facilities, including the following:         06/29/15 - Patient/family informed of bed offers received.  Patient [facility] bed at  [facility]     Physician recommends and patient chooses bed at      Patient to be transferred to  [facility] on  07/01/15.  Patient to be transferred to facility by  ambulance     Patient family notified on  06/30/15 of transfer.  Name of family member notified:   [name], at the bedside.     PHYSICIAN       Additional Comment:     _______________________________________________ Sable Feil, LCSW 06/30/2015, 2:21 PM

## 2015-07-01 ENCOUNTER — Inpatient Hospital Stay (HOSPITAL_COMMUNITY): Payer: Medicare Other

## 2015-07-01 DIAGNOSIS — R269 Unspecified abnormalities of gait and mobility: Secondary | ICD-10-CM | POA: Diagnosis not present

## 2015-07-01 DIAGNOSIS — J9601 Acute respiratory failure with hypoxia: Secondary | ICD-10-CM | POA: Diagnosis not present

## 2015-07-01 DIAGNOSIS — M6281 Muscle weakness (generalized): Secondary | ICD-10-CM | POA: Diagnosis not present

## 2015-07-01 DIAGNOSIS — K5901 Slow transit constipation: Secondary | ICD-10-CM | POA: Diagnosis not present

## 2015-07-01 DIAGNOSIS — F419 Anxiety disorder, unspecified: Secondary | ICD-10-CM | POA: Diagnosis not present

## 2015-07-01 DIAGNOSIS — F329 Major depressive disorder, single episode, unspecified: Secondary | ICD-10-CM | POA: Diagnosis not present

## 2015-07-01 DIAGNOSIS — I10 Essential (primary) hypertension: Secondary | ICD-10-CM | POA: Diagnosis not present

## 2015-07-01 DIAGNOSIS — R278 Other lack of coordination: Secondary | ICD-10-CM | POA: Diagnosis not present

## 2015-07-01 DIAGNOSIS — R300 Dysuria: Secondary | ICD-10-CM | POA: Diagnosis not present

## 2015-07-01 DIAGNOSIS — D508 Other iron deficiency anemias: Secondary | ICD-10-CM | POA: Diagnosis not present

## 2015-07-01 DIAGNOSIS — I5032 Chronic diastolic (congestive) heart failure: Secondary | ICD-10-CM | POA: Diagnosis not present

## 2015-07-01 DIAGNOSIS — J9691 Respiratory failure, unspecified with hypoxia: Secondary | ICD-10-CM | POA: Diagnosis not present

## 2015-07-01 DIAGNOSIS — R0600 Dyspnea, unspecified: Secondary | ICD-10-CM

## 2015-07-01 DIAGNOSIS — E46 Unspecified protein-calorie malnutrition: Secondary | ICD-10-CM | POA: Diagnosis not present

## 2015-07-01 DIAGNOSIS — J189 Pneumonia, unspecified organism: Secondary | ICD-10-CM | POA: Diagnosis not present

## 2015-07-01 DIAGNOSIS — D72829 Elevated white blood cell count, unspecified: Secondary | ICD-10-CM | POA: Diagnosis not present

## 2015-07-01 DIAGNOSIS — I495 Sick sinus syndrome: Secondary | ICD-10-CM | POA: Diagnosis not present

## 2015-07-01 DIAGNOSIS — I4891 Unspecified atrial fibrillation: Secondary | ICD-10-CM | POA: Diagnosis not present

## 2015-07-01 DIAGNOSIS — Z79899 Other long term (current) drug therapy: Secondary | ICD-10-CM | POA: Diagnosis not present

## 2015-07-01 DIAGNOSIS — R2681 Unsteadiness on feet: Secondary | ICD-10-CM | POA: Diagnosis not present

## 2015-07-01 DIAGNOSIS — E785 Hyperlipidemia, unspecified: Secondary | ICD-10-CM | POA: Diagnosis not present

## 2015-07-01 DIAGNOSIS — K5900 Constipation, unspecified: Secondary | ICD-10-CM | POA: Diagnosis not present

## 2015-07-01 DIAGNOSIS — R5381 Other malaise: Secondary | ICD-10-CM | POA: Diagnosis not present

## 2015-07-01 DIAGNOSIS — I480 Paroxysmal atrial fibrillation: Secondary | ICD-10-CM | POA: Diagnosis not present

## 2015-07-01 LAB — BASIC METABOLIC PANEL
ANION GAP: 10 (ref 5–15)
BUN: 22 mg/dL — ABNORMAL HIGH (ref 6–20)
CALCIUM: 9.9 mg/dL (ref 8.9–10.3)
CHLORIDE: 100 mmol/L — AB (ref 101–111)
CO2: 29 mmol/L (ref 22–32)
CREATININE: 0.68 mg/dL (ref 0.44–1.00)
GFR calc Af Amer: >60 mL/min (ref >60)
GFR calc non Af Amer: >60 mL/min (ref >60)
GLUCOSE: 128 mg/dL — AB (ref 65–99)
Potassium: 4.4 mmol/L (ref 3.5–5.1)
Sodium: 139 mmol/L (ref 135–145)

## 2015-07-01 LAB — ECHOCARDIOGRAM COMPLETE
CHL CUP MV DEC (S): 275
E decel time: 275 msec
EERAT: 17.33
FS: 36 % (ref 28–44)
HEIGHTINCHES: 61 in
IV/PV OW: 1.14
LA diam end sys: 33 mm
LA vol A4C: 20.1 ml
LADIAMINDEX: 2.14 cm/m2
LASIZE: 33 mm
LV PW d: 8.55 mm — AB (ref 0.6–1.1)
LV e' LATERAL: 5.77 cm/s
LVEEAVG: 17.33
LVEEMED: 17.33
LVOT area: 2.84 cm2
LVOT diameter: 19 mm
MV Peak grad: 4 mmHg
MVPKAVEL: 139 m/s
MVPKEVEL: 100 m/s
P 1/2 time: 662 ms
TDI e' lateral: 5.77
TDI e' medial: 3.81
WEIGHTICAEL: 1985.6 [oz_av]

## 2015-07-01 LAB — CBC
HCT: 39.4 % (ref 36.0–46.0)
HEMOGLOBIN: 12.9 g/dL (ref 12.0–15.0)
MCH: 30 pg (ref 26.0–34.0)
MCHC: 32.7 g/dL (ref 30.0–36.0)
MCV: 91.6 fL (ref 78.0–100.0)
Platelets: 195 10*3/uL (ref 150–400)
RBC: 4.3 MIL/uL (ref 3.87–5.11)
RDW: 13.2 % (ref 11.5–15.5)
WBC: 12.5 10*3/uL — ABNORMAL HIGH (ref 4.0–10.5)

## 2015-07-01 LAB — LEGIONELLA PNEUMOPHILA SEROGP 1 UR AG: L. pneumophila Serogp 1 Ur Ag: NEGATIVE

## 2015-07-01 LAB — GLUCOSE, CAPILLARY
GLUCOSE-CAPILLARY: 107 mg/dL — AB (ref 65–99)
GLUCOSE-CAPILLARY: 258 mg/dL — AB (ref 65–99)
Glucose-Capillary: 271 mg/dL — ABNORMAL HIGH (ref 65–99)

## 2015-07-01 MED ORDER — CEFDINIR 300 MG PO CAPS: 300.0000 mg | ORAL_CAPSULE | Freq: Two times a day (BID) | ORAL | Status: DC

## 2015-07-01 MED ORDER — PREDNISONE 20 MG PO TABS: 40.0000 mg | ORAL_TABLET | Freq: Every day | ORAL | Status: DC

## 2015-07-01 MED ORDER — GUAIFENESIN ER 600 MG PO TB12: 600.0000 mg | ORAL_TABLET | Freq: Two times a day (BID) | ORAL | Status: DC

## 2015-07-01 MED ORDER — LORAZEPAM 0.5 MG PO TABS: 0.5000 mg | ORAL_TABLET | Freq: Three times a day (TID) | ORAL | Status: DC | PRN

## 2015-07-01 MED ORDER — AZITHROMYCIN 500 MG PO TABS: 500.0000 mg | ORAL_TABLET | ORAL | Status: DC

## 2015-07-01 MED ORDER — INSULIN DETEMIR 100 UNIT/ML ~~LOC~~ SOLN: 10.0000 [IU] | Freq: Every day | SUBCUTANEOUS | Status: DC

## 2015-07-01 NOTE — Progress Notes (Signed)
Echocardiogram 2D Echocardiogram has been performed.  Jo [name] 07/01/2015, 4:02 PM

## 2015-07-01 NOTE — Discharge Summary (Addendum)
Physician Discharge Summary  Erabella Kuipers Bouchie MRN: [id number] DOB/AGE: 1917-04-24 82 y.o.  PCP: Jerlyn Ly, MD   Admit date: 06/27/2015 Discharge date: 07/01/2015  Discharge Diagnoses:     Principal Problem:   CAP (community acquired pneumonia) Active Problems:   PAF (paroxysmal atrial fibrillation) (HCC)   History of aortic insufficiency   Hypertension   Type II diabetes mellitus (HCC)   Chronic diastolic CHF (congestive heart failure) (HCC)    Pacemaker   Abdominal distention    Follow-up recommendations Follow-up with PCP in 3-5 days , including all  additional recommended appointments as below Follow-up CBC, CMP in 3-5 days  Patient being discharged to SNF      Current Discharge Medication List    START taking these medications   Details  azithromycin (ZITHROMAX) 500 MG tablet Take 1 tablet (500 mg total) by mouth daily. Qty: 5 tablet, Refills: 0    cefdinir (OMNICEF) 300 MG capsule Take 1 capsule (300 mg total) by mouth 2 (two) times daily. Qty: 10 capsule, Refills: 0    guaiFENesin (MUCINEX) 600 MG 12 hr tablet Take 1 tablet (600 mg total) by mouth 2 (two) times daily. Qty: 20 tablet, Refills: 0    insulin detemir (LEVEMIR) 100 UNIT/ML injection Inject 0.1 mLs (10 Units total) into the skin at bedtime. Qty: 10 mL, Refills: 11    predniSONE (DELTASONE) 20 MG tablet Take 2 tablets (40 mg total) by mouth daily with breakfast. Qty: 5 tablet, Refills: 0      CONTINUE these medications which have CHANGED   Details  LORazepam (ATIVAN) 0.5 MG tablet Take 1 tablet (0.5 mg total) by mouth every 8 (eight) hours as needed for anxiety. 0.25 mg in the morning, 0.25 mg in the afternoon, and 0.5 mg in the evening Qty: 30 tablet, Refills: 5      CONTINUE these medications which have NOT CHANGED   Details  acetaminophen (TYLENOL) 325 MG tablet Take 325 mg by mouth at bedtime as needed for moderate pain (back pain).    apixaban (ELIQUIS) 2.5 MG TABS tablet Take 1 tablet  (2.5 mg total) by mouth 2 (two) times daily. Qty: 60 tablet, Refills: 11    azelastine (ASTELIN) 0.1 % nasal spray Place 1 spray into both nostrils daily as needed for rhinitis. Use in each nostril as directed    carvedilol (COREG) 6.25 MG tablet Take 1 tablet (6.25 mg total) by mouth 2 (two) times daily. Qty: 180 tablet, Refills: 3    CRESTOR 5 MG tablet Take 2.5 mg by mouth 3 (three) times a week. Monday, Wednesday and Friday Refills: 7    diltiazem (CARDIZEM CD) 240 MG 24 hr capsule Take 240 mg by mouth daily.     sertraline (ZOLOFT) 50 MG tablet Take 50 mg by mouth daily. Refills: 3    Vitamin D, Ergocalciferol, (DRISDOL) 50000 UNITS CAPS capsule Take 50,000 Units by mouth every Wednesday.    zoledronic acid (RECLAST) 5 MG/100ML SOLN injection Inject 5 mg into the muscle as directed.          Discharge Condition:  Stable  Discharge Instructions Get Medicines reviewed and adjusted: Please take all your medications with you for your next visit with your Primary MD  Please request your Primary MD to go over all hospital tests and procedure/radiological results at the follow up, please ask your Primary MD to get all Hospital records sent to his/her office.  If you experience worsening of your admission symptoms, develop shortness of breath,  life threatening emergency, suicidal or homicidal thoughts you must seek medical attention immediately by calling 911 or calling your MD immediately if symptoms less severe.  You must read complete instructions/literature along with all the possible adverse reactions/side effects for all the Medicines you take and that have been prescribed to you. Take any new Medicines after you have completely understood and accpet all the possible adverse reactions/side effects.   Do not drive when taking Pain medications.   Do not take more than prescribed Pain, Sleep and Anxiety Medications  Special Instructions: If you have smoked or chewed Tobacco  in the last 2 yrs please stop smoking, stop any regular Alcohol and or any Recreational drug use.  Wear Seat belts while driving.  Please note  You were cared for by a hospitalist during your hospital stay. Once you are discharged, your primary care physician will handle any further medical issues. Please note that NO REFILLS for any discharge medications will be authorized once you are discharged, as it is imperative that you return to your primary care physician (or establish a relationship with a primary care physician if you do not have one) for your aftercare needs so that they can reassess your need for medications and monitor your lab values.     Allergies  Allergen Reactions  . Sulfonamide Derivatives Rash    "it was all over my legs"  . Citalopram Hydrobromide     Pt does not remember reaction      Disposition: 01-Home or Self Care   Consults:  None     Significant Diagnostic Studies:  Dg Chest 2 View  06/27/2015  CLINICAL DATA:  Shortness of breath EXAM: CHEST  2 VIEW COMPARISON:  August 14, 2013 FINDINGS: There is new infiltrate in the right mid lung. The heart, hila, mediastinum, lungs, and pleura are otherwise unremarkable. No pneumothorax. IMPRESSION: New infiltrate in the right mid lung. Recommend follow-up to resolution. Electronically Signed   By: Dorise Bullion III M.D   On: 06/27/2015 18:15   Dg Chest Port 1 View  06/29/2015  CLINICAL DATA:  Recent admission for respiratory failure and pneumonia. EXAM: PORTABLE CHEST 1 VIEW COMPARISON:  06/27/2015. FINDINGS: Trachea is midline. Heart size stable. Pacemaker lead tips object over the right atrium and right ventricle. There is airspace opacification in the right upper lobe, similar. Minimal streaky atelectasis or scarring in the left lower lobe. No pleural fluid. IMPRESSION: Patchy airspace opacification in the right upper lobe [place] be due to pneumonia. Followup PA and lateral chest X-ray is recommended in 3-4 weeks  following trial of antibiotic therapy to ensure resolution and exclude underlying malignancy. Electronically Signed   By: Lorin Picket M.D.   On: 06/29/2015 16:47   Dg Abd Portable 1v  06/27/2015  CLINICAL DATA:  Abdominal distention EXAM: PORTABLE ABDOMEN - 1 VIEW COMPARISON:  07/06/2012 abdominal CT FINDINGS: Motion degraded study. Gas scattered throughout nondilated colon and small bowel. No abnormal stool retention. No concerning intra-abdominal mass effect or calcification. Grossly clear basilar lungs. IMPRESSION: Nonobstructive bowel gas pattern. Electronically Signed   By: Monte Fantasia M.D.   On: 06/27/2015 23:51    Echocardiogram pending      Filed Weights   06/28/15 1933 06/29/15 1929 06/30/15 2021  Weight: 56.926 kg (125 lb 8 oz) 55.929 kg (123 lb 4.8 oz) 56.291 kg (124 lb 1.6 oz)     Microbiology: Recent Results (from the past 240 hour(s))  Blood culture (routine x 2)  Status: None (Preliminary result)   Collection Time: 06/27/15  6:59 PM  Result Value Ref Range Status   Specimen Description BLOOD RIGHT ANTECUBITAL  Final   Special Requests BOTTLES DRAWN AEROBIC AND ANAEROBIC 5CC  Final   Culture NO GROWTH 3 DAYS  Final   Report Status PENDING  Incomplete  Blood culture (routine x 2)     Status: None (Preliminary result)   Collection Time: 06/27/15  7:05 PM  Result Value Ref Range Status   Specimen Description BLOOD RIGHT ANTECUBITAL  Final   Special Requests BOTTLES DRAWN AEROBIC ONLY 5CC  Final   Culture NO GROWTH 3 DAYS  Final   Report Status PENDING  Incomplete       Blood Culture    Component Value Date/Time   SDES BLOOD RIGHT ANTECUBITAL 06/27/2015 1905   SPECREQUEST BOTTLES DRAWN AEROBIC ONLY 5CC 06/27/2015 1905   CULT NO GROWTH 3 DAYS 06/27/2015 1905   REPTSTATUS PENDING 06/27/2015 1905      Labs: Results for orders placed or performed during the hospital encounter of 06/27/15 (from the past 48 hour(s))  Glucose, capillary     Status:  Abnormal   Collection Time: 06/29/15  4:03 PM  Result Value Ref Range   Glucose-Capillary 213 (H) 65 - 99 mg/dL   Comment 1 Notify RN    Comment 2 Document in Chart   Glucose, capillary     Status: Abnormal   Collection Time: 06/29/15  7:26 PM  Result Value Ref Range   Glucose-Capillary 182 (H) 65 - 99 mg/dL  Strep pneumoniae urinary antigen     Status: None   Collection Time: 06/30/15  6:39 AM  Result Value Ref Range   Strep Pneumo Urinary Antigen NEGATIVE NEGATIVE    Comment:        Infection due to S. pneumoniae cannot be absolutely ruled out since the antigen present Suhr be below the detection limit of the test.   Urinalysis, Routine w reflex microscopic (not at [facility])     Status: None   Collection Time: 06/30/15  6:39 AM  Result Value Ref Range   Color, Urine YELLOW YELLOW   APPearance CLEAR CLEAR   Specific Gravity, Urine 1.014 1.005 - 1.030   pH 7.0 5.0 - 8.0   Glucose, UA NEGATIVE NEGATIVE mg/dL   Hgb urine dipstick NEGATIVE NEGATIVE   Bilirubin Urine NEGATIVE NEGATIVE   Ketones, ur NEGATIVE NEGATIVE mg/dL   Protein, ur NEGATIVE NEGATIVE mg/dL   Nitrite NEGATIVE NEGATIVE   Leukocytes, UA NEGATIVE NEGATIVE    Comment: MICROSCOPIC NOT DONE ON URINES WITH NEGATIVE PROTEIN, BLOOD, LEUKOCYTES, NITRITE, OR GLUCOSE <1000 mg/dL.  CBC     Status: Abnormal   Collection Time: 06/30/15  6:47 AM  Result Value Ref Range   WBC 14.1 (H) 4.0 - 10.5 K/uL   RBC 4.29 3.87 - 5.11 MIL/uL   Hemoglobin 12.6 12.0 - 15.0 g/dL   HCT 39.2 36.0 - 46.0 %   MCV 91.4 78.0 - 100.0 fL   MCH 29.4 26.0 - 34.0 pg   MCHC 32.1 30.0 - 36.0 g/dL   RDW 13.2 11.5 - 15.5 %   Platelets 189 150 - 400 K/uL  Comprehensive metabolic panel     Status: Abnormal   Collection Time: 06/30/15  6:47 AM  Result Value Ref Range   Sodium 138 135 - 145 mmol/L   Potassium 3.3 (L) 3.5 - 5.1 mmol/L   Chloride 101 101 - 111 mmol/L   CO2 28 22 -  32 mmol/L   Glucose, Bld 129 (H) 65 - 99 mg/dL   BUN 19 6 - 20 mg/dL    Creatinine, Ser 0.65 0.44 - 1.00 mg/dL   Calcium 9.3 8.9 - 10.3 mg/dL   Total Protein 6.0 (L) 6.5 - 8.1 g/dL   Albumin 3.2 (L) 3.5 - 5.0 g/dL   AST 16 15 - 41 U/L   ALT 13 (L) 14 - 54 U/L   Alkaline Phosphatase 54 38 - 126 U/L   Total Bilirubin 0.6 0.3 - 1.2 mg/dL   GFR calc non Af Amer >60 >60 mL/min   GFR calc Af Amer >60 >60 mL/min    Comment: (NOTE) The eGFR has been calculated using the CKD EPI equation. This calculation has not been validated in all clinical situations. eGFR's persistently <60 mL/min signify possible Chronic Kidney Disease.    Anion gap 9 5 - 15  Glucose, capillary     Status: Abnormal   Collection Time: 06/30/15  7:44 AM  Result Value Ref Range   Glucose-Capillary 112 (H) 65 - 99 mg/dL  Glucose, capillary     Status: Abnormal   Collection Time: 06/30/15 11:28 AM  Result Value Ref Range   Glucose-Capillary 186 (H) 65 - 99 mg/dL  Glucose, capillary     Status: Abnormal   Collection Time: 06/30/15  4:45 PM  Result Value Ref Range   Glucose-Capillary 358 (H) 65 - 99 mg/dL  Glucose, capillary     Status: Abnormal   Collection Time: 06/30/15  8:20 PM  Result Value Ref Range   Glucose-Capillary 307 (H) 65 - 99 mg/dL  Basic metabolic panel     Status: Abnormal   Collection Time: 07/01/15  5:49 AM  Result Value Ref Range   Sodium 139 135 - 145 mmol/L   Potassium 4.4 3.5 - 5.1 mmol/L    Comment: DELTA CHECK NOTED NO VISIBLE HEMOLYSIS    Chloride 100 (L) 101 - 111 mmol/L   CO2 29 22 - 32 mmol/L   Glucose, Bld 128 (H) 65 - 99 mg/dL   BUN 22 (H) 6 - 20 mg/dL   Creatinine, Ser 0.68 0.44 - 1.00 mg/dL   Calcium 9.9 8.9 - 10.3 mg/dL   GFR calc non Af Amer >60 >60 mL/min   GFR calc Af Amer >60 >60 mL/min    Comment: (NOTE) The eGFR has been calculated using the CKD EPI equation. This calculation has not been validated in all clinical situations. eGFR's persistently <60 mL/min signify possible Chronic Kidney Disease.    Anion gap 10 5 - 15  CBC     Status:  Abnormal   Collection Time: 07/01/15  5:49 AM  Result Value Ref Range   WBC 12.5 (H) 4.0 - 10.5 K/uL   RBC 4.30 3.87 - 5.11 MIL/uL   Hemoglobin 12.9 12.0 - 15.0 g/dL   HCT 39.4 36.0 - 46.0 %   MCV 91.6 78.0 - 100.0 fL   MCH 30.0 26.0 - 34.0 pg   MCHC 32.7 30.0 - 36.0 g/dL   RDW 13.2 11.5 - 15.5 %   Platelets 195 150 - 400 K/uL  Glucose, capillary     Status: Abnormal   Collection Time: 07/01/15  7:19 AM  Result Value Ref Range   Glucose-Capillary 107 (H) 65 - 99 mg/dL  Glucose, capillary     Status: Abnormal   Collection Time: 07/01/15 11:44 AM  Result Value Ref Range   Glucose-Capillary 271 (H) 65 - 99 mg/dL  Lipid Panel  No results found for: CHOL, TRIG, HDL, CHOLHDL, VLDL, LDLCALC, LDLDIRECT   82 yr old female with past medical history of paroxysmal atrial fibrillation anticoagulated with Eliquis, Types 2 DM, aortic insufficiency, diastolic heart failure and diabetes mellitus admitted on 6/3 with respiratory failure secondary to pneumonia. Patient started on oxygen plus antibiotics and brought into the hospital. Initial chest x-ray shows infiltrate in the right [facility] course    Acute respiratory failure with hypoxia secondary to CAP (community acquired pneumonia):  Continue antibiotics, oxygen and nebulizer treatments. sputum culture no growth so far, strep pneumo urine antigen negative, legionella antigen in process, She states in the last couple days she was having some choking spells, ruled out for aspiration by speech therapy Currently requiring 3-4 L of oxygen at rest, repeat chest x-ray shows slight worsening, [place] need outpatient CT chest to ensure resolution and rule out malignancy Continue Rocephin/azithromycin, day #5, continue prednisone, start prn Xopenex, cont azithromycin and omnicef for another 5 days to compete rx  Suspect underlying pulmonary hypertension worsening hypoxemia, 2-D echo pending Patient given written instructions about plan of  care as she is [facility] and discussed with the son   Hypokalemia-replete   PAF (paroxysmal atrial fibrillation) ([place]) status post pacemaker: Currently stable and rate controlled . On anticoagulation. Chads 2 score of 6, cont cardizem    History of aortic insufficiency repeat 2-D echo   Hypertension: Blood pressure on the rise. Monitor input and output. Have added when necessary hydralazine.   Type II diabetes mellitus ([place]): Blood sugars on the rise. Continue Levemir, and sliding scale insulin. Hemoglobin A1c 7.6   Chronic diastolic CHF (congestive heart failure) without exacerbation: BNP normal. Follow daily weights. Currently euvolemic. Last 2-D echo was 07/23/11. Patient also had a component of mild pulmonary hypertension, will give one dose of lasix 40 mg iv To help wean her oxygen    Physical therapy SNF         Blood pressure 132/48, pulse 63, temperature 97.7 F (36.5 C), temperature source Oral, resp. rate 21, height _0  (1.549 m), weight 56.291 kg (124 lb 1.6 oz), SpO2 97 %.     General: Alert & oriented x 3, NAD   Cardiovascular: RRR S1S2   Respiratory: Bilateral audible wheeze   Abdomen: Soft, distended, NT, +BS   Musculoskeletal: No clubbing, cyanosis, or edema   Skin: No skin breaks, tears or lesions  Psychiatry: Pt is appropriate, no evidence of psychosis  Follow-up Information    Follow up with PERINI,MARK A, MD. Schedule an appointment as soon as possible for a visit in 1 week.   Specialty:  Internal Medicine   Why:  hospital follow up   Contact information:   [address] [place] [address] [phone]       Signed: Reyne Dumas 07/01/2015, 2:23 PM        Time spent >45 mins

## 2015-07-01 NOTE — Progress Notes (Signed)
Pt was on 2L O2 on assessment. Weaned pt to 1 L and approx 30 minutes later pt began feeling SOB. O2 sats remained > 95%. Increased O2 to 2 L and pt stated she felt better.

## 2015-07-02 ENCOUNTER — Non-Acute Institutional Stay (SKILLED_NURSING_FACILITY): Payer: Medicare Other | Admitting: Internal Medicine

## 2015-07-02 ENCOUNTER — Encounter: Payer: Self-pay | Admitting: Internal Medicine

## 2015-07-02 DIAGNOSIS — E46 Unspecified protein-calorie malnutrition: Secondary | ICD-10-CM | POA: Diagnosis not present

## 2015-07-02 DIAGNOSIS — I480 Paroxysmal atrial fibrillation: Secondary | ICD-10-CM | POA: Diagnosis not present

## 2015-07-02 DIAGNOSIS — I5032 Chronic diastolic (congestive) heart failure: Secondary | ICD-10-CM | POA: Diagnosis not present

## 2015-07-02 DIAGNOSIS — F329 Major depressive disorder, single episode, unspecified: Secondary | ICD-10-CM | POA: Diagnosis not present

## 2015-07-02 DIAGNOSIS — F32A Depression, unspecified: Secondary | ICD-10-CM

## 2015-07-02 DIAGNOSIS — D72829 Elevated white blood cell count, unspecified: Secondary | ICD-10-CM

## 2015-07-02 DIAGNOSIS — R5381 Other malaise: Secondary | ICD-10-CM

## 2015-07-02 DIAGNOSIS — E785 Hyperlipidemia, unspecified: Secondary | ICD-10-CM | POA: Diagnosis not present

## 2015-07-02 DIAGNOSIS — J189 Pneumonia, unspecified organism: Secondary | ICD-10-CM | POA: Diagnosis not present

## 2015-07-02 DIAGNOSIS — K5900 Constipation, unspecified: Secondary | ICD-10-CM

## 2015-07-02 DIAGNOSIS — J9601 Acute respiratory failure with hypoxia: Secondary | ICD-10-CM | POA: Diagnosis not present

## 2015-07-02 LAB — CULTURE, BLOOD (ROUTINE X 2)
CULTURE: NO GROWTH
CULTURE: NO GROWTH

## 2015-07-02 NOTE — Progress Notes (Signed)
LOCATION: [name]  PCP: Jerlyn Ly, MD   Code Status: Full Code  Goals of care: Advanced Directive information Advanced Directives 06/27/2015  Does patient have an advance directive? No  Would patient like information on creating an advanced directive? No - patient declined information  Pre-existing out of facility DNR order (yellow form or pink MOST form) -       Extended Emergency Contact Information Primary Emergency Contact: Butler,Brenda Address: [address], [place] [address] [place] of [place] Phone: [phone] Mobile Phone: [phone] Relation: Daughter Secondary Emergency Contact: [name],Gene Address: [address]          [place], [place] [address] [name] of [place] Phone: [phone] Mobile Phone: [phone] Relation: Son   Allergies  Allergen Reactions  . Sulfonamide Derivatives Rash    "it was all over my legs"  . Citalopram Hydrobromide     Pt does not remember reaction    Chief Complaint  Patient presents with  . New Admit To SNF    New Admission     HPI:  Patient is a 82 y.o. female seen today for short term rehabilitation post hospital admission from 06/27/15-07/01/15 with acute respiratory failure with community aquired pneumonia. She is currently on oxygen and antibiotics. She is seen in her room today.   Review of Systems:  Constitutional: Negative for fever, diaphoresis. Positive for chills. Feels weak and tired. HENT: Negative for headache, congestion, sore throat, difficulty swallowing. Wears hearing aids.   Eyes: Negative for blurred vision, double vision and discharge. Wears glasses.  Respiratory: Negative for shortness of breath and wheezing. On 2 1/2 liter of oxygen. Positive for cough but unable to bring up phlegm.   Cardiovascular: Negative for chest pain, palpitations, leg swelling.  Gastrointestinal: Negative for heartburn, nausea, vomiting, abdominal pain. Last bowel movement was  Saturday.  Genitourinary: Negative for dysuria and flank pain.  Musculoskeletal: Negative for back pain, fall in the facility.  Skin: Negative for itching, rash.  Neurological: Negative for dizziness. Psychiatric/Behavioral: Negative for depression   Past Medical History  Diagnosis Date  . Moderate aortic insufficiency     a. 06/2011 Echo: EF 60-65%, mild LVH, no rwma, mod AI.  Marland Kitchen Anxiety   . Essential hypertension   . PAF (paroxysmal atrial fibrillation) (HCC)     a. chronic flecainide->reduced to 25 bid 08/2014.  . SSS (sick sinus syndrome) (HCC)     a. s/p pacemaker. b. s/p Medtronic gen change 07/2011; c.   . Type II diabetes mellitus ([place])     "controlled w/diet"  . Skin cancer ?1960's    "between breasts"  . Arthritis     "hands; between shoulders; back; feet"  . Panic attacks     "since losing husband 1994"  . Depression   . High risk medication use     on Flecainide  . Chronic anticoagulation     -->Eliquis  . Osteoporosis   . Chronic diastolic CHF (congestive heart failure) ([facility])     a. 06/2011 Echo: EF 60-65%.  . Kidney stone     passed in [place] 2014  . Osteoporosis    Past Surgical History  Procedure Laterality Date  . Cesarean section  1955  . Cardiovascular stress test  12/09/2004  . Cataract extraction w/ intraocular lens  implant, bilateral  1990's  . Tonsillectomy      "school age"  . Appendectomy  1955  .  Dilation and curettage of uterus  1953  . Breast cyst excision  ~ 1950    right  . Insert / replace / remove pacemaker  03/16/2006  . Skin cancer excision  ?1960's    "between my breasts"  . Permanent pacemaker generator change  08/03/2011    Procedure: PERMANENT PACEMAKER GENERATOR CHANGE;  Surgeon: Thompson Grayer, MD;  Location: [facility] CATH LAB;  Service: Cardiovascular;;  . Breast lumpectomy with needle localization Bilateral 02/03/2014    Procedure: BILATERAL BREAST LUMPECTOMY WITH NEEDLE LOCALIZATION ON LEFT;  Surgeon: Excell Seltzer, MD;  Location: [facility];  Service: General;  Laterality: Bilateral;   Social History:   reports that she has never smoked. She has never used smokeless tobacco. She reports that she does not drink alcohol or use illicit drugs.  Family History  Problem Relation Age of Onset  . Emphysema Father     Medications:   Medication List       This list is accurate as of: 07/02/15 11:54 AM.  Always use your most recent med list.               acetaminophen 325 MG tablet  Commonly known as:  TYLENOL  Take 325 mg by mouth at bedtime as needed for moderate pain (back pain).     apixaban 2.5 MG Tabs tablet  Commonly known as:  ELIQUIS  Take 1 tablet (2.5 mg total) by mouth 2 (two) times daily.     azelastine 0.1 % nasal spray  Commonly known as:  ASTELIN  Place 1 spray into both nostrils daily as needed for rhinitis. Use in each nostril as directed     azithromycin 500 MG tablet  Commonly known as:  ZITHROMAX  Take 1 tablet (500 mg total) by mouth daily.     carvedilol 6.25 MG tablet  Commonly known as:  COREG  Take 1 tablet (6.25 mg total) by mouth 2 (two) times daily.     cefdinir 300 MG capsule  Commonly known as:  OMNICEF  Take 1 capsule (300 mg total) by mouth 2 (two) times daily.     CRESTOR 5 MG tablet  Generic drug:  rosuvastatin  Take 2.5 mg by mouth 3 (three) times a week. Monday, Wednesday and Friday     diltiazem 240 MG 24 hr capsule  Commonly known as:  CARDIZEM CD  Take 240 mg by mouth daily.     guaiFENesin 600 MG 12 hr tablet  Commonly known as:  MUCINEX  Take 1 tablet (600 mg total) by mouth 2 (two) times daily.     insulin detemir 100 UNIT/ML injection  Commonly known as:  LEVEMIR  Inject 0.1 mLs (10 Units total) into the skin at bedtime.     LORazepam 0.5 MG tablet  Commonly known as:  ATIVAN  Take 1 tablet (0.5 mg total) by mouth every 8 (eight) hours as needed for anxiety. 0.25 mg in the morning, 0.25 mg in the afternoon, and 0.5 mg in the evening     predniSONE 20 MG  tablet  Commonly known as:  DELTASONE  Take 2 tablets (40 mg total) by mouth daily with breakfast.     RECLAST 5 MG/100ML Soln injection  Generic drug:  zoledronic acid  Inject 5 mg into the muscle as directed.     sertraline 50 MG tablet  Commonly known as:  ZOLOFT  Take 50 mg by mouth daily.     Vitamin D (Ergocalciferol) 50000 units Caps capsule  Commonly known as:  DRISDOL  Take 50,000 Units by mouth every Wednesday.        Immunizations: Immunization History  Administered Date(s) Administered  . Pneumococcal-Unspecified 09/25/2007     Physical Exam: Filed Vitals:   07/02/15 1147  BP: 102/67  Pulse: 84  Temp: 97.9 F (36.6 C)  TempSrc: Oral  Resp: 18  Height: 5\' 1"  (1.549 m)  Weight: 119 lb 3.2 oz (54.069 kg)  SpO2: 94%   Body mass index is 22.53 kg/(m^2).  General- elderly female, frail and thin built, in no acute distress Head- normocephalic, atraumatic Nose- no maxillary or frontal sinus tenderness, no nasal discharge Throat- moist mucus membrane Eyes- PERRLA, EOMI, no pallor, no icterus, no discharge, normal conjunctiva, normal sclera Neck- no cervical lymphadenopathy Cardiovascular- normal s1,s2, no murmur, no leg edema Respiratory- bilateral clear to auscultation, no wheeze, no rhonchi, no crackles, no use of accessory muscles Abdomen- bowel sounds present, soft, non tender Musculoskeletal- able to move all 4 extremities, generalized weakness Neurological- alert and oriented to person, place and time Skin- warm and dry, easy bruising Psychiatry- normal mood and affect    Labs reviewed: Basic Metabolic Panel:  Recent Labs  06/28/15 0402 06/30/15 0647 07/01/15 0549  NA 139 138 139  K 3.7 3.3* 4.4  CL 106 101 100*  CO2 24 28 29   GLUCOSE 337* 129* 128*  BUN 14 19 22*  CREATININE 0.65 0.65 0.68  CALCIUM 9.2 9.3 9.9   Liver Function Tests:  Recent Labs  06/27/15 1902 06/30/15 0647  AST 17 16  ALT 14 13*  ALKPHOS 63 54  BILITOT 0.6  0.6  PROT 6.6 6.0*  ALBUMIN 3.6 3.2*   No results for input(s): LIPASE, AMYLASE in the last 8760 hours. No results for input(s): AMMONIA in the last 8760 hours. CBC:  Recent Labs  06/27/15 1902 06/28/15 0402 06/30/15 0647 07/01/15 0549  WBC 8.0 4.7 14.1* 12.5*  NEUTROABS 6.8  --   --   --   HGB 13.1 12.8 12.6 12.9  HCT 39.1 38.0 39.2 39.4  MCV 91.4 90.9 91.4 91.6  PLT 164 138* 189 195   Cardiac Enzymes: No results for input(s): CKTOTAL, CKMB, CKMBINDEX, TROPONINI in the last 8760 hours. BNP: Invalid input(s): POCBNP CBG:  Recent Labs  07/01/15 0719 07/01/15 1144 07/01/15 1611  GLUCAP 107* 271* 258*    Radiological Exams: Dg Chest 2 View  06/27/2015  CLINICAL DATA:  Shortness of breath EXAM: CHEST  2 VIEW COMPARISON:  August 14, 2013 FINDINGS: There is new infiltrate in the right mid lung. The heart, hila, mediastinum, lungs, and pleura are otherwise unremarkable. No pneumothorax. IMPRESSION: New infiltrate in the right mid lung. Recommend follow-up to resolution. Electronically Signed   By: Dorise Bullion III M.D   On: 06/27/2015 18:15   Dg Chest Port 1 View  06/29/2015  CLINICAL DATA:  Recent admission for respiratory failure and pneumonia. EXAM: PORTABLE CHEST 1 VIEW COMPARISON:  06/27/2015. FINDINGS: Trachea is midline. Heart size stable. Pacemaker lead tips object over the right atrium and right ventricle. There is airspace opacification in the right upper lobe, similar. Minimal streaky atelectasis or scarring in the left lower lobe. No pleural fluid. IMPRESSION: Patchy airspace opacification in the right upper lobe [place] be due to pneumonia. Followup PA and lateral chest X-ray is recommended in 3-4 weeks following trial of antibiotic therapy to ensure resolution and exclude underlying malignancy. Electronically Signed   By: Lorin Picket M.D.   On: 06/29/2015 16:47  Dg Abd Portable 1v  06/27/2015  CLINICAL DATA:  Abdominal distention EXAM: PORTABLE ABDOMEN - 1 VIEW  COMPARISON:  07/06/2012 abdominal CT FINDINGS: Motion degraded study. Gas scattered throughout nondilated colon and small bowel. No abnormal stool retention. No concerning intra-abdominal mass effect or calcification. Grossly clear basilar lungs. IMPRESSION: Nonobstructive bowel gas pattern. Electronically Signed   By: Monte Fantasia M.D.   On: 06/27/2015 23:51    Assessment/Plan   Physical deconditioning Will have her work with physical therapy and occupational therapy team to help with gait training and muscle strengthening exercises.fall precautions. Skin care. Encourage to be out of bed.   Acute respiratory failure In setting of CAP. Currently on o2, wean this off as tolerated. Continue her cough syrup. Add incentive spirometer. Continue and complete course of prednisone 40 mg daily.  CAP Continue and complete course of azithromycin 500 mg daily and cefdinir 300 mg bid 07/06/15. Monitor wbc and temp curve. Continue mucinex  Leukocytosis With her being treated for pneumonia and being on steroids, check cbc with diff after antibiotic completion  afib Has pacemakerRate controlled. Continue coreg 6.25 mg bid and cardizem 240 mg daily for rate control. Continue eliquis for anticoagulation  Protein calorie malnutrition Get dietary consult  HLD Continue crestor  Chronic depression Stable, continue zoloft 50 mg daily  Chronic diastolic chf Stable, continue b blocker and monitor  Constipation Add colace 100 mg bid and monitor   Goals of care: short term rehabilitation   Labs/tests ordered: cbc, cmp 07/06/15  Family/ staff Communication: reviewed care plan with patient and nursing supervisor    Blanchie Serve, MD Internal Medicine [place], [place] [address] Cell Phone (Monday-Friday 8 am - 5 pm): [phone] On Call: [phone] and follow prompts after 5 pm and on weekends Office Phone: [phone] Office  Fax: [phone]

## 2015-07-06 ENCOUNTER — Non-Acute Institutional Stay (SKILLED_NURSING_FACILITY): Payer: Medicare Other | Admitting: Family

## 2015-07-06 DIAGNOSIS — F419 Anxiety disorder, unspecified: Secondary | ICD-10-CM | POA: Diagnosis not present

## 2015-07-06 LAB — CBC AND DIFFERENTIAL
HCT: 39 % (ref 36–46)
Hemoglobin: 12.9 g/dL (ref 12.0–16.0)
Platelets: 273 10*3/uL (ref 150–399)
WBC: 14.1 10^3/mL

## 2015-07-06 LAB — HEPATIC FUNCTION PANEL
ALK PHOS: 58 U/L (ref 25–125)
ALT: 28 U/L (ref 7–35)
AST: 16 U/L (ref 13–35)
Bilirubin, Total: 0.5 mg/dL

## 2015-07-06 LAB — BASIC METABOLIC PANEL
BUN: 21 mg/dL (ref 4–21)
CREATININE: 0.8 mg/dL (ref 0.5–1.1)
Glucose: 93 mg/dL
POTASSIUM: 4.2 mmol/L (ref 3.4–5.3)
SODIUM: 142 mmol/L (ref 137–147)

## 2015-07-06 MED ORDER — LORAZEPAM 0.5 MG PO TABS: 0.5000 mg | ORAL_TABLET | Freq: Every day | ORAL | Status: DC

## 2015-07-06 NOTE — Progress Notes (Signed)
Patient ID: Jo [name], female   DOB: 01/05/1918, 82 y.o.   MRN: [id number]  Location:  [facility]:  SNF (31) Provider:  Herta Hink FNP-C   Jerlyn Ly, MD  Patient Care Team: Crist Infante, MD as PCP - General (Internal Medicine)  Extended Emergency Contact Information Primary Emergency Contact: Butler,Brenda Address: [address] [place], [place] [address] [name] of [place] Phone: [phone] Mobile Phone: [phone] Relation: Daughter Secondary Emergency Contact: [name],Gene Address: [address]          [place], [place] [address] [name] of [place] Phone: [phone] Mobile Phone: [phone] Relation: Son  Code Status: Full Code  Goals of care: Advanced Directive information Advanced Directives 07/23/2015  Does patient have an advance directive? Yes  Type of Advance Directive -  Would patient like information on creating an advanced directive? -     Chief Complaint  Patient presents with  . Acute Visit    medication evaluation     HPI:  Pt is a 82 y.o. female seen today at [facility] for an acute visit for evaluation of antianxiety medication. She has a significant medical history of Anxiety, HTN, CHF, Type 2 DM among other conditions. She is seen in her room today with her daughter at bedside. She complains of feeling drowsy and sleepy during the day due to her current dose of Ativan. She states usually takes 0.25 mg in the morning and afternoon and 0.5 mg tablet at bedtime. Patient's daughter states home regimen works well for her mother since she gets very anxious without medication. She is currently on 0.5 mg Three times daily. She reports no episodes of fall.    Past Medical History  Diagnosis Date  . Moderate aortic insufficiency     a. 06/2011 Echo: EF 60-65%, mild LVH, no rwma, mod AI.  Marland Kitchen Anxiety   . Essential hypertension   . PAF (paroxysmal atrial  fibrillation) (HCC)     a. chronic flecainide->reduced to 25 bid 08/2014.  . SSS (sick sinus syndrome) (HCC)     a. s/p pacemaker. b. s/p Medtronic gen change 07/2011; c.   . Type II diabetes mellitus ([facility])     "controlled w/diet"  . Skin cancer ?1960's    "between breasts"  . Arthritis     "hands; between shoulders; back; feet"  . Panic attacks     "since losing husband 1994"  . Depression   . High risk medication use     on Flecainide  . Chronic anticoagulation     -->Eliquis  . Osteoporosis   . Chronic diastolic CHF (congestive heart failure) ([facility])     a. 06/2011 Echo: EF 60-65%.  . Kidney stone     passed in [place] 2014  . Osteoporosis    Past Surgical History  Procedure Laterality Date  . Cesarean section  1955  . Cardiovascular stress test  12/09/2004  . Cataract extraction w/ intraocular lens  implant, bilateral  1990's  . Tonsillectomy      "school age"  . Appendectomy  1955  . Dilation and curettage of uterus  1953  . Breast cyst excision  ~ 1950    right  . Insert / replace / remove pacemaker  03/16/2006  . Skin cancer excision  ?1960's    "between my breasts"  . Permanent pacemaker generator change  08/03/2011  Procedure: PERMANENT PACEMAKER GENERATOR CHANGE;  Surgeon: Thompson Grayer, MD;  Location: [facility] CATH LAB;  Service: Cardiovascular;;  . Breast lumpectomy with needle localization Bilateral 02/03/2014    Procedure: BILATERAL BREAST LUMPECTOMY WITH NEEDLE LOCALIZATION ON LEFT;  Surgeon: Excell Seltzer, MD;  Location: [facility];  Service: General;  Laterality: Bilateral;    Allergies  Allergen Reactions  . Sulfonamide Derivatives Rash    "it was all over my legs"  . Citalopram Hydrobromide     Pt does not remember reaction      Medication List       This list is accurate as of: 07/06/15 11:59 PM.  Always use your most recent med list.               acetaminophen 325 MG tablet  Commonly known as:  TYLENOL  Take 325 mg by mouth at bedtime as needed for  moderate pain (back pain).     apixaban 2.5 MG Tabs tablet  Commonly known as:  ELIQUIS  Take 1 tablet (2.5 mg total) by mouth 2 (two) times daily.     azelastine 0.1 % nasal spray  Commonly known as:  ASTELIN  Place 1 spray into both nostrils daily as needed for rhinitis. Use in each nostril as directed     azithromycin 500 MG tablet  Commonly known as:  ZITHROMAX  Take 1 tablet (500 mg total) by mouth daily.     carvedilol 6.25 MG tablet  Commonly known as:  COREG  Take 1 tablet (6.25 mg total) by mouth 2 (two) times daily.     cefdinir 300 MG capsule  Commonly known as:  OMNICEF  Take 1 capsule (300 mg total) by mouth 2 (two) times daily.     CRESTOR 5 MG tablet  Generic drug:  rosuvastatin  Take 2.5 mg by mouth 3 (three) times a week. Monday, Wednesday and Friday     diltiazem 240 MG 24 hr capsule  Commonly known as:  CARDIZEM CD  Take 240 mg by mouth daily.     guaiFENesin 600 MG 12 hr tablet  Commonly known as:  MUCINEX  Take 1 tablet (600 mg total) by mouth 2 (two) times daily.     insulin detemir 100 UNIT/ML injection  Commonly known as:  LEVEMIR  Inject 0.1 mLs (10 Units total) into the skin at bedtime.     LORazepam 0.5 MG tablet  Commonly known as:  ATIVAN  Take 1 tablet (0.5 mg total) by mouth at bedtime. 0.25 mg in the morning at 9 Am, 0.25 mg in the afternoon at 3 pM, and 0.5 mg Tablet at bedtime.     predniSONE 20 MG tablet  Commonly known as:  DELTASONE  Take 2 tablets (40 mg total) by mouth daily with breakfast.     RECLAST 5 MG/100ML Soln injection  Generic drug:  zoledronic acid  Inject 5 mg into the muscle as directed. Reported on 07/06/2015     sertraline 50 MG tablet  Commonly known as:  ZOLOFT  Take 50 mg by mouth daily.     Vitamin D (Ergocalciferol) 50000 units Caps capsule  Commonly known as:  DRISDOL  Take 50,000 Units by mouth every Wednesday.        Review of Systems  Constitutional: Negative for fever, chills, activity change  and appetite change.  HENT: Positive for hearing loss.   Respiratory: Negative for chest tightness, shortness of breath and wheezing.   Cardiovascular: Negative for chest pain, palpitations and  leg swelling.  Gastrointestinal: Negative for nausea, vomiting, constipation and abdominal distention.  Skin: Negative.   Neurological: Negative for dizziness, weakness, light-headedness and headaches.  Psychiatric/Behavioral: Negative for hallucinations, confusion, sleep disturbance and agitation. The patient is nervous/anxious.     Immunization History  Administered Date(s) Administered  . PPD Test 07/01/2015  . Pneumococcal-Unspecified 09/25/2007   Pertinent  Health Maintenance Due  Topic Date Due  . FOOT EXAM  06/15/1929  . OPHTHALMOLOGY EXAM  06/15/1929  . URINE MICROALBUMIN  06/15/1929  . DEXA SCAN  01/25/2023 (Originally 06/15/1984)  . PNA vac Low Risk Adult (2 of 2 - PCV13) 01/25/2023 (Originally 09/24/2008)  . INFLUENZA VACCINE  08/25/2015  . HEMOGLOBIN A1C  12/28/2015   No flowsheet data found. Functional Status Survey:    Filed Vitals:   07/06/15 1500  BP: 134/80  Pulse: 88  Temp: 98 F (36.7 C)  Resp: 18  Height: 5\' 1"  (1.549 m)  Weight: 119 lb (53.978 kg)  SpO2: 98%   Body mass index is 22.5 kg/(m^2). Physical Exam  Constitutional: She is oriented to person, place, and time. She appears well-developed and well-nourished. No distress.  HENT:  Head: Normocephalic.  Eyes: Conjunctivae and EOM are normal. Pupils are equal, round, and reactive to light. Right eye exhibits no discharge. Left eye exhibits no discharge. No scleral icterus.  HOH   Neck: Normal range of motion.  Cardiovascular: Normal rate, regular rhythm and intact distal pulses.  Exam reveals no gallop and no friction rub.   No murmur heard. Pulmonary/Chest: Effort normal and breath sounds normal. No respiratory distress. She has no wheezes. She has no rales.  Oxygen via Nasal cannula in place     Neurological: She is oriented to person, place, and time.  Skin: Skin is dry. No rash noted. No erythema. No pallor.  Psychiatric: She has a normal mood and affect.    Labs reviewed:  Recent Labs  06/28/15 0402 06/30/15 0647 07/01/15 0549 07/06/15  NA 139 138 139 142  K 3.7 3.3* 4.4 4.2  CL 106 101 100*  --   CO2 24 28 29   --   GLUCOSE 337* 129* 128*  --   BUN 14 19 22* 21  CREATININE 0.65 0.65 0.68 0.8  CALCIUM 9.2 9.3 9.9  --     Recent Labs  06/27/15 1902 06/30/15 0647 07/06/15  AST 17 16 16   ALT 14 13* 28  ALKPHOS 63 54 58  BILITOT 0.6 0.6  --   PROT 6.6 6.0*  --   ALBUMIN 3.6 3.2*  --     Recent Labs  06/27/15 1902 06/28/15 0402 06/30/15 0647 07/01/15 0549 07/06/15 07/09/15  WBC 8.0 4.7 14.1* 12.5* 14.1 14.8  NEUTROABS 6.8  --   --   --   --   --   HGB 13.1 12.8 12.6 12.9 12.9 12.6  HCT 39.1 38.0 39.2 39.4 39 38  MCV 91.4 90.9 91.4 91.6  --   --   PLT 164 138* 189 195 273 263   No results found for: TSH Lab Results  Component Value Date   HGBA1C 7.6* 06/28/2015   No results found for: CHOL, HDL, LDLCALC, LDLDIRECT, TRIG, CHOLHDL  Significant Diagnostic Results in last 30 days:  Dg Chest Port 1 View  06/29/2015  CLINICAL DATA:  Recent admission for respiratory failure and pneumonia. EXAM: PORTABLE CHEST 1 VIEW COMPARISON:  06/27/2015. FINDINGS: Trachea is midline. Heart size stable. Pacemaker lead tips object over the right atrium and  right ventricle. There is airspace opacification in the right upper lobe, similar. Minimal streaky atelectasis or scarring in the left lower lobe. No pleural fluid. IMPRESSION: Patchy airspace opacification in the right upper lobe [place] be due to pneumonia. Followup PA and lateral chest X-ray is recommended in 3-4 weeks following trial of antibiotic therapy to ensure resolution and exclude underlying malignancy. Electronically Signed   By: Lorin Picket M.D.   On: 06/29/2015 16:47    Assessment/Plan Anxiety Request dose  reduction. Reduce Ativan to 0.25 mg Tablet one by mouth at 9 Am and 3 pm. Ativan 0.5 mg Tablet one by mouth at bedtime. Continue to monitor.     Family/ staff Communication:Reviewed plan of care with patient , patient's daughter and facility Nurse supervisor   Labs/tests ordered: None

## 2015-07-07 ENCOUNTER — Other Ambulatory Visit: Payer: Self-pay | Admitting: *Deleted

## 2015-07-07 LAB — CUP PACEART REMOTE DEVICE CHECK
Battery Remaining Longevity: 124 mo
Battery Voltage: 2.8 V
Brady Statistic AP VP Percent: 0 %
Brady Statistic AS VP Percent: 0 %
Implantable Lead Implant Date: 20080221
Implantable Lead Location: 753859
Implantable Lead Model: 4076
Lead Channel Setting Pacing Amplitude: 2 V
Lead Channel Setting Sensing Sensitivity: 2 mV
MDC IDC LEAD IMPLANT DT: 20080221
MDC IDC LEAD LOCATION: 753860
MDC IDC MSMT BATTERY IMPEDANCE: 183 Ohm
MDC IDC MSMT LEADCHNL RA IMPEDANCE VALUE: 508 Ohm
MDC IDC MSMT LEADCHNL RV IMPEDANCE VALUE: 433 Ohm
MDC IDC SESS DTM: 20170530150805
MDC IDC SET LEADCHNL RV PACING AMPLITUDE: 2.5 V
MDC IDC SET LEADCHNL RV PACING PULSEWIDTH: 0.4 ms
MDC IDC STAT BRADY AP VS PERCENT: 98 %
MDC IDC STAT BRADY AS VS PERCENT: 2 %

## 2015-07-07 MED ORDER — LORAZEPAM 0.5 MG PO TABS: ORAL_TABLET | ORAL | Status: DC

## 2015-07-07 NOTE — Telephone Encounter (Signed)
[facility] 

## 2015-07-09 ENCOUNTER — Non-Acute Institutional Stay (SKILLED_NURSING_FACILITY): Payer: Medicare Other | Admitting: Family

## 2015-07-09 ENCOUNTER — Encounter: Payer: Self-pay | Admitting: Family

## 2015-07-09 DIAGNOSIS — I5032 Chronic diastolic (congestive) heart failure: Secondary | ICD-10-CM | POA: Diagnosis not present

## 2015-07-09 DIAGNOSIS — E46 Unspecified protein-calorie malnutrition: Secondary | ICD-10-CM

## 2015-07-09 DIAGNOSIS — D72829 Elevated white blood cell count, unspecified: Secondary | ICD-10-CM | POA: Diagnosis not present

## 2015-07-09 LAB — CBC AND DIFFERENTIAL
HEMATOCRIT: 38 % (ref 36–46)
HEMOGLOBIN: 12.6 g/dL (ref 12.0–16.0)
PLATELETS: 263 10*3/uL (ref 150–399)
WBC: 14.8 10^3/mL

## 2015-07-09 NOTE — Progress Notes (Signed)
Patient ID: Jo [name], female   DOB: 21-Apr-1917, 82 y.o.   MRN: [id number]  Location:  [place] Room Number: 106 Place of Service:  SNF (31) Provider:  Almon Whitford FNP-C   Jerlyn Ly, MD  Patient Care Team: Crist Infante, MD as PCP - General (Internal Medicine)  Extended Emergency Contact Information Primary Emergency Contact: [name],Jo Address: [address] [place], [place] [address] Jo [name] of [place] Phone: [phone] Mobile Phone: [phone] Relation: Daughter Secondary Emergency Contact: Jo,Gene Address: [address]          [place], [place] [address] Jo [name] of [place] Phone: [phone] Mobile Phone: [phone] Relation: Son  Code Status: Full Code  Goals of care: Advanced Directive information Advanced Directives 07/09/2015  Does patient have an advance directive? Yes  Type of Advance Directive (No Data)  Would patient like information on creating an advanced directive? -     Chief Complaint  Patient presents with  . Acute Visit    Acute concerns- Leukocytosis    HPI:  Pt is a 82 y.o. female seen today at [facility]  for an acute visit for evaluation of abnormal lab results. She was recent admitted at the hospital 06/27/15-07/01/15 due to  Acute respiratory failure with community acquired pneumonia she was discharge to Rehab on oral antibiotics. She completed course of Zithromax 500 mg Tablet X 5 days 07/06/2015. Her recent lab results showed increased WBC 14.1 (07/06/2015) and 14.8 (07/09/2015). She states feeling much better though still requires oxygen via nasal cannula. She denies any fever or  Chills.    Past Medical History  Diagnosis Date  . Moderate aortic insufficiency     a. 06/2011 Echo: EF 60-65%, mild LVH, no rwma, mod AI.  Marland Kitchen Anxiety   . Essential hypertension   . PAF (paroxysmal atrial fibrillation) (HCC)     a. chronic flecainide->reduced to 25 bid 08/2014.    . SSS (sick sinus syndrome) (HCC)     a. s/p pacemaker. b. s/p Medtronic gen change 07/2011; c.   . Type II diabetes mellitus ([place])     "controlled w/diet"  . Skin cancer ?1960's    "between breasts"  . Arthritis     "hands; between shoulders; back; feet"  . Panic attacks     "since losing husband 1994"  . Depression   . High risk medication use     on Flecainide  . Chronic anticoagulation     -->Eliquis  . Osteoporosis   . Chronic diastolic CHF (congestive heart failure) ([place])     a. 06/2011 Echo: EF 60-65%.  . Kidney stone     passed in [place] 2014  . Osteoporosis    Past Surgical History  Procedure Laterality Date  . Cesarean section  1955  . Cardiovascular stress test  12/09/2004  . Cataract extraction w/ intraocular lens  implant, bilateral  1990's  . Tonsillectomy      "school age"  . Appendectomy  1955  . Dilation and curettage of uterus  1953  . Breast cyst excision  ~ 1950    right  . Insert / replace / remove pacemaker  03/16/2006  . Skin cancer excision  ?1960's    "between my breasts"  . Permanent pacemaker generator change  08/03/2011    Procedure: PERMANENT PACEMAKER GENERATOR CHANGE;  Surgeon: Thompson Grayer, MD;  Location: [facility] CATH LAB;  Service:  Cardiovascular;;  . Breast lumpectomy with needle localization Bilateral 02/03/2014    Procedure: BILATERAL BREAST LUMPECTOMY WITH NEEDLE LOCALIZATION ON LEFT;  Surgeon: Excell Seltzer, MD;  Location: [place];  Service: General;  Laterality: Bilateral;    Allergies  Allergen Reactions  . Sulfonamide Derivatives Rash    "it was all over my legs"  . Citalopram Hydrobromide     Pt does not remember reaction      Medication List       This list is accurate as of: 07/09/15 12:48 PM.  Always use your most recent med list.               acetaminophen 325 MG tablet  Commonly known as:  TYLENOL  Take 325 mg by mouth at bedtime as needed for moderate pain (back pain).     apixaban 2.5 MG Tabs tablet  Commonly  known as:  ELIQUIS  Take 1 tablet (2.5 mg total) by mouth 2 (two) times daily.     azelastine 0.1 % nasal spray  Commonly known as:  ASTELIN  Place 1 spray into both nostrils daily as needed for rhinitis. Use in each nostril as directed     carvedilol 6.25 MG tablet  Commonly known as:  COREG  Take 1 tablet (6.25 mg total) by mouth 2 (two) times daily.     CRESTOR 5 MG tablet  Generic drug:  rosuvastatin  Take 2.5 mg by mouth 3 (three) times a week. Monday, Wednesday and Friday     diltiazem 240 MG 24 hr capsule  Commonly known as:  CARDIZEM CD  Take 240 mg by mouth daily.     docusate sodium 100 MG capsule  Commonly known as:  COLACE  Take 100 mg by mouth 2 (two) times daily.     guaiFENesin 600 MG 12 hr tablet  Commonly known as:  MUCINEX  Take 1 tablet (600 mg total) by mouth 2 (two) times daily.     insulin detemir 100 UNIT/ML injection  Commonly known as:  LEVEMIR  Inject 0.1 mLs (10 Units total) into the skin at bedtime.     insulin lispro 100 UNIT/ML injection  Commonly known as:  HUMALOG  SSI 150-250 (5 units), 251-300 (8 units), 301-350(10 units), > 350 call provider     LORazepam 0.5 MG tablet  Commonly known as:  ATIVAN  Take 0.5 mg by mouth every 8 (eight) hours as needed for anxiety.     polyethylene glycol packet  Commonly known as:  MIRALAX / GLYCOLAX  Take 17 g by mouth 2 (two) times daily. Hold for loose stool mix in 4-8 oz liquid     sertraline 50 MG tablet  Commonly known as:  ZOLOFT  Take 50 mg by mouth daily.     UNABLE TO FIND  Med Name: Medpass 120 cc by mouth daily for protein malnutrition     Vitamin D (Ergocalciferol) 50000 units Caps capsule  Commonly known as:  DRISDOL  Take 50,000 Units by mouth every Wednesday.        Review of Systems  Constitutional: Negative for fever, chills, activity change and appetite change.  HENT: Positive for hearing loss.   Eyes: Negative.   Respiratory: Positive for cough. Negative for chest  tightness, shortness of breath and wheezing.        Oxygen via nasal cannula   Cardiovascular: Negative for chest pain, palpitations and leg swelling.  Gastrointestinal: Negative for nausea, vomiting, constipation and abdominal distention.  Musculoskeletal: Positive  for gait problem.  Skin: Negative.   Neurological: Negative for dizziness, weakness, light-headedness and headaches.  Psychiatric/Behavioral: Negative for hallucinations, confusion, sleep disturbance and agitation. The patient is not nervous/anxious.     Immunization History  Administered Date(s) Administered  . PPD Test 07/01/2015  . Pneumococcal-Unspecified 09/25/2007   Pertinent  Health Maintenance Due  Topic Date Due  . FOOT EXAM  06/15/1929  . OPHTHALMOLOGY EXAM  06/15/1929  . URINE MICROALBUMIN  06/15/1929  . DEXA SCAN  01/25/2023 (Originally 06/15/1984)  . PNA vac Low Risk Adult (2 of 2 - PCV13) 01/25/2023 (Originally 09/24/2008)  . INFLUENZA VACCINE  08/25/2015  . HEMOGLOBIN A1C  12/28/2015   No flowsheet data found. Functional Status Survey:    Filed Vitals:   07/09/15 0959  BP: 132/84  Pulse: 83  Temp: 98.2 F (36.8 C)  Resp: 20  Height: 5\' 1"  (1.549 m)  Weight: 119 lb (53.978 kg)  SpO2: 98%   Body mass index is 22.5 kg/(m^2). Physical Exam  Constitutional: She is oriented to person, place, and time. She appears well-developed and well-nourished. No distress.  HENT:  Head: Normocephalic.  Eyes: Conjunctivae and EOM are normal. Pupils are equal, round, and reactive to light. Right eye exhibits no discharge. Left eye exhibits no discharge. No scleral icterus.  HOH   Neck: Normal range of motion.  Cardiovascular: Normal rate, regular rhythm and intact distal pulses.  Exam reveals no gallop and no friction rub.   No murmur heard. Pulmonary/Chest: Effort normal and breath sounds normal. No respiratory distress. She has no wheezes. She has no rales.  Oxygen via Nasal cannula in place. Bilateral  diminished breath sounds to Auscultation.   Abdominal: Soft. Bowel sounds are normal. She exhibits no distension. There is no tenderness. There is no rebound and no guarding.  Musculoskeletal: She exhibits no tenderness.  Trace pitting edema.   Neurological: She is oriented to person, place, and time.  Skin: Skin is dry. No rash noted. No erythema. No pallor.  Psychiatric: She has a normal mood and affect.    Labs reviewed:  Recent Labs  06/28/15 0402 06/30/15 0647 07/01/15 0549 07/06/15  NA 139 138 139 142  K 3.7 3.3* 4.4 4.2  CL 106 101 100*  --   CO2 24 28 29   --   GLUCOSE 337* 129* 128*  --   BUN 14 19 22* 21  CREATININE 0.65 0.65 0.68 0.8  CALCIUM 9.2 9.3 9.9  --     Recent Labs  06/27/15 1902 06/30/15 0647 07/06/15  AST 17 16 16   ALT 14 13* 28  ALKPHOS 63 54 58  BILITOT 0.6 0.6  --   PROT 6.6 6.0*  --   ALBUMIN 3.6 3.2*  --     Recent Labs  06/27/15 1902 06/28/15 0402 06/30/15 0647 07/01/15 0549 07/06/15 07/09/15  WBC 8.0 4.7 14.1* 12.5* 14.1 14.8  NEUTROABS 6.8  --   --   --   --   --   HGB 13.1 12.8 12.6 12.9 12.9 12.6  HCT 39.1 38.0 39.2 39.4 39 38  MCV 91.4 90.9 91.4 91.6  --   --   PLT 164 138* 189 195 273 263   No results found for: TSH Lab Results  Component Value Date   HGBA1C 7.6* 06/28/2015   No results found for: CHOL, HDL, LDLCALC, LDLDIRECT, TRIG, CHOLHDL  Significant Diagnostic Results in last 30 days:  Dg Chest 2 View  06/27/2015  CLINICAL DATA:  Shortness of  breath EXAM: CHEST  2 VIEW COMPARISON:  August 14, 2013 FINDINGS: There is new infiltrate in the right mid lung. The heart, hila, mediastinum, lungs, and pleura are otherwise unremarkable. No pneumothorax. IMPRESSION: New infiltrate in the right mid lung. Recommend follow-up to resolution. Electronically Signed   By: Dorise Bullion III M.D   On: 06/27/2015 18:15   Dg Chest Port 1 View  06/29/2015  CLINICAL DATA:  Recent admission for respiratory failure and pneumonia. EXAM:  PORTABLE CHEST 1 VIEW COMPARISON:  06/27/2015. FINDINGS: Trachea is midline. Heart size stable. Pacemaker lead tips object over the right atrium and right ventricle. There is airspace opacification in the right upper lobe, similar. Minimal streaky atelectasis or scarring in the left lower lobe. No pleural fluid. IMPRESSION: Patchy airspace opacification in the right upper lobe [place] be due to pneumonia. Followup PA and lateral chest X-ray is recommended in 3-4 weeks following trial of antibiotic therapy to ensure resolution and exclude underlying malignancy. Electronically Signed   By: Lorin Picket M.D.   On: 06/29/2015 16:47   Dg Abd Portable 1v  06/27/2015  CLINICAL DATA:  Abdominal distention EXAM: PORTABLE ABDOMEN - 1 VIEW COMPARISON:  07/06/2012 abdominal CT FINDINGS: Motion degraded study. Gas scattered throughout nondilated colon and small bowel. No abnormal stool retention. No concerning intra-abdominal mass effect or calcification. Grossly clear basilar lungs. IMPRESSION: Nonobstructive bowel gas pattern. Electronically Signed   By: Monte Fantasia M.D.   On: 06/27/2015 23:51    Assessment/Plan 1. Leukocytosis Afebrile. WBC trending up. Completed course of Zithromax 500 mg Tablet 07/06/2015. Will obtain follow up CXR r/o PNA. Urine specimen for U/A and C/S ordered. Repeat CBC/diff 07/10/2015. Monitor vital signs twice daily.   2. Protein-calorie malnutrition (HCC) TP 5.4, ALb 3.24 ( 07/06/2015). Appetite slow improving. Start Medpass No added sugar 120 cc by mouth daily.   3. Chronic diastolic CHF (congestive heart failure) (HCC) Stable. No weight gain.Exam findings negative. Continue Diltiazem and coreg 6.25 mg Tablet. Continue oxygen via [place]. Daily weight.     Family/ staff Communication: Reviewed plan of care with patient and facility Nurse supervisor.   Labs/tests ordered:  CXR r/o PNA.CBC/diff 07/10/2015, Urine specimen for U/A and C/S

## 2015-07-14 ENCOUNTER — Encounter: Payer: Self-pay | Admitting: Cardiology

## 2015-07-15 ENCOUNTER — Encounter: Payer: Self-pay | Admitting: Internal Medicine

## 2015-07-15 ENCOUNTER — Ambulatory Visit (INDEPENDENT_AMBULATORY_CARE_PROVIDER_SITE_OTHER): Payer: Medicare Other | Admitting: Internal Medicine

## 2015-07-15 ENCOUNTER — Other Ambulatory Visit: Payer: Self-pay

## 2015-07-15 VITALS — BP 122/56 | HR 86 | Ht 62.0 in | Wt 122.6 lb

## 2015-07-15 DIAGNOSIS — I495 Sick sinus syndrome: Secondary | ICD-10-CM | POA: Diagnosis not present

## 2015-07-15 DIAGNOSIS — I10 Essential (primary) hypertension: Secondary | ICD-10-CM

## 2015-07-15 LAB — CUP PACEART INCLINIC DEVICE CHECK
Battery Impedance: 159 Ohm
Brady Statistic AP VP Percent: 0 %
Brady Statistic AS VS Percent: 3 %
Date Time Interrogation Session: 20170621144036
Implantable Lead Implant Date: 20080221
Implantable Lead Location: 753859
Lead Channel Impedance Value: 396 Ohm
Lead Channel Pacing Threshold Amplitude: 0.5 V
Lead Channel Pacing Threshold Pulse Width: 0.4 ms
Lead Channel Sensing Intrinsic Amplitude: 2.8 mV
Lead Channel Sensing Intrinsic Amplitude: 5.6 mV
MDC IDC LEAD IMPLANT DT: 20080221
MDC IDC LEAD LOCATION: 753860
MDC IDC MSMT BATTERY REMAINING LONGEVITY: 129 mo
MDC IDC MSMT BATTERY VOLTAGE: 2.8 V
MDC IDC MSMT LEADCHNL RA IMPEDANCE VALUE: 500 Ohm
MDC IDC MSMT LEADCHNL RV PACING THRESHOLD AMPLITUDE: 0.75 V
MDC IDC MSMT LEADCHNL RV PACING THRESHOLD PULSEWIDTH: 0.4 ms
MDC IDC SET LEADCHNL RA PACING AMPLITUDE: 2 V
MDC IDC SET LEADCHNL RV PACING AMPLITUDE: 2.5 V
MDC IDC SET LEADCHNL RV PACING PULSEWIDTH: 0.4 ms
MDC IDC SET LEADCHNL RV SENSING SENSITIVITY: 2 mV
MDC IDC STAT BRADY AP VS PERCENT: 96 %
MDC IDC STAT BRADY AS VP PERCENT: 0 %

## 2015-07-15 NOTE — Progress Notes (Signed)
PCP: Jerlyn Ly, MD Primary Cardiologist: Peter Martinique, MD  Jo [name] is a 82 y.o. female who presents today for routine electrophysiology followup.  She has done well since her last visit.  She has rare palpitations but no sustained afib.  She stopped her flecainide recently when seen by Ignacia Bayley (his note reviewed).  Today, she denies symptoms of chest pain, shortness of breath,  lower extremity edema, dizziness, presyncope, or syncope.  The patient is otherwise without complaint today.   Past Medical History  Diagnosis Date  . Moderate aortic insufficiency     a. 06/2011 Echo: EF 60-65%, mild LVH, no rwma, mod AI.  Marland Kitchen Anxiety   . Essential hypertension   . PAF (paroxysmal atrial fibrillation) (HCC)     a. chronic flecainide->reduced to 25 bid 08/2014.  . SSS (sick sinus syndrome) (HCC)     a. s/p pacemaker. b. s/p Medtronic gen change 07/2011; c.   . Type II diabetes mellitus ([place])     "controlled w/diet"  . Skin cancer ?1960's    "between breasts"  . Arthritis     "hands; between shoulders; back; feet"  . Panic attacks     "since losing husband 1994"  . Depression   . High risk medication use     on Flecainide  . Chronic anticoagulation     -->Eliquis  . Osteoporosis   . Chronic diastolic CHF (congestive heart failure) ([place])     a. 06/2011 Echo: EF 60-65%.  . Kidney stone     passed in [place] 2014  . Osteoporosis    Past Surgical History  Procedure Laterality Date  . Cesarean section  1955  . Cardiovascular stress test  12/09/2004  . Cataract extraction w/ intraocular lens  implant, bilateral  1990's  . Tonsillectomy      "school age"  . Appendectomy  1955  . Dilation and curettage of uterus  1953  . Breast cyst excision  ~ 1950    right  . Insert / replace / remove pacemaker  03/16/2006  . Skin cancer excision  ?1960's    "between my breasts"  . Permanent pacemaker generator change  08/03/2011    Procedure: PERMANENT PACEMAKER GENERATOR CHANGE;  Surgeon: Thompson Grayer, MD;  Location: [facility] CATH LAB;  Service: Cardiovascular;;  . Breast lumpectomy with needle localization Bilateral 02/03/2014    Procedure: BILATERAL BREAST LUMPECTOMY WITH NEEDLE LOCALIZATION ON LEFT;  Surgeon: Excell Seltzer, MD;  Location: [place];  Service: General;  Laterality: Bilateral;    Current Outpatient Prescriptions  Medication Sig Dispense Refill  . acetaminophen (TYLENOL) 325 MG tablet Take 325 mg by mouth at bedtime as needed for moderate pain (back pain).    Marland Kitchen apixaban (ELIQUIS) 2.5 MG TABS tablet Take 1 tablet (2.5 mg total) by mouth 2 (two) times daily. 60 tablet 11  . azelastine (ASTELIN) 0.1 % nasal spray Place 1 spray into both nostrils daily as needed for rhinitis. Use in each nostril as directed    . carvedilol (COREG) 6.25 MG tablet Take 1 tablet (6.25 mg total) by mouth 2 (two) times daily. 180 tablet 3  . CRESTOR 5 MG tablet Take 2.5 mg by mouth 3 (three) times a week. Monday, Wednesday and Friday  7  . diltiazem (CARDIZEM CD) 240 MG 24 hr capsule Take 240 mg by mouth daily.     Marland Kitchen docusate sodium (COLACE) 100 MG capsule Take 100 mg by mouth 2 (two) times daily.    . insulin detemir (  LEVEMIR) 100 UNIT/ML injection Inject 0.1 mLs (10 Units total) into the skin at bedtime. 10 mL 11  . insulin lispro (HUMALOG) 100 UNIT/ML injection as directed. SSI 150-250 (5 units), 251-300 (8 units), 301-350(10 units), > 350 call provider    . LORazepam (ATIVAN) 0.5 MG tablet Take 0.5 mg by mouth every 8 (eight) hours as needed for anxiety.    . polyethylene glycol (MIRALAX / GLYCOLAX) packet Take 17 g by mouth 2 (two) times daily. Hold for loose stool. Mix in 4-8 oz liquid    . sertraline (ZOLOFT) 50 MG tablet Take 50 mg by mouth daily.  3  . UNABLE TO FIND Med Name: Medpass  Take 120 cc by mouth daily for protein malnutrition    . Vitamin D, Ergocalciferol, (DRISDOL) 50000 UNITS CAPS capsule Take 50,000 Units by mouth every Wednesday.     No current facility-administered  medications for this visit.   ROS- all systems are reviewed and negative except as per HPI above  Physical Exam: Filed Vitals:   07/15/15 1410  BP: 122/56  Pulse: 86  Height: 5\' 2"  (1.575 m)  Weight: 122 lb 9.6 oz (55.611 kg)  SpO2: 93%    GEN- The patient is elderly appearing, alert and oriented x 3 today.   Head- normocephalic, atraumatic Eyes-  Sclera clear, conjunctiva pink Ears- hearing intact Oropharynx- clear Lungs- Clear to ausculation bilaterally, normal work of breathing Chest- pacemaker pocket is well healed Heart- Regular rate and rhythm, no murmurs, rubs or gallops, PMI not laterally displaced GI- soft, NT, ND, + BS Extremities- no clubbing, cyanosis, or edema  Pacemaker interrogation- reviewed in detail today,  See PACEART report  Assessment and Plan:  1. Sick sinus syndrome Normal pacemaker function See Pace Art report No changes today  2. Paroxysmal atrial fibrillation Maintaining sinus rhythm off of flecainide (no afib by PPM interrogation) Continue eliquis  3. HTN Stable No change required today  carelink Follow-up with Dr Martinique as scheduled Return to see EP NP in 1 year  Thompson Grayer MD, [facility] 07/15/2015 2:30 PM

## 2015-07-15 NOTE — Patient Instructions (Signed)
Medication Instructions:  Your physician recommends that you continue on your current medications as directed. Please refer to the Current Medication list given to you today.     Labwork: None ordered   Testing/Procedures: None ordered   Follow-Up: Your physician wants you to follow-up in: 12 months with Chanetta Marshall, NPYou will receive a reminder letter in the mail two months in advance. If you don't receive a letter, please call our office to schedule the follow-up appointment.  Remote monitoring is used to monitor your Pacemaker  from home. This monitoring reduces the number of office visits required to check your device to one time per year. It allows [place] to keep an eye on the functioning of your device to ensure it is working properly. You are scheduled for a device check from home on 10/14/15. You [place] send your transmission at any time that day. If you have a wireless device, the transmission will be sent automatically. After your physician reviews your transmission, you will receive a postcard with your next transmission date.     Any Other Special Instructions Will Be Listed Below (If Applicable).     If you need a refill on your cardiac medications before your next appointment, please call your pharmacy.

## 2015-07-23 ENCOUNTER — Non-Acute Institutional Stay (SKILLED_NURSING_FACILITY): Payer: Medicare Other | Admitting: Family

## 2015-07-23 ENCOUNTER — Encounter: Payer: Self-pay | Admitting: Family

## 2015-07-23 DIAGNOSIS — K5901 Slow transit constipation: Secondary | ICD-10-CM

## 2015-07-23 DIAGNOSIS — J189 Pneumonia, unspecified organism: Secondary | ICD-10-CM

## 2015-07-23 DIAGNOSIS — E785 Hyperlipidemia, unspecified: Secondary | ICD-10-CM

## 2015-07-23 DIAGNOSIS — F419 Anxiety disorder, unspecified: Secondary | ICD-10-CM | POA: Diagnosis not present

## 2015-07-23 DIAGNOSIS — I5032 Chronic diastolic (congestive) heart failure: Secondary | ICD-10-CM

## 2015-07-23 DIAGNOSIS — R269 Unspecified abnormalities of gait and mobility: Secondary | ICD-10-CM

## 2015-07-23 DIAGNOSIS — I4891 Unspecified atrial fibrillation: Secondary | ICD-10-CM | POA: Diagnosis not present

## 2015-07-23 NOTE — Progress Notes (Signed)
Location:   [facility] Room Number: 106 Place of Service:  SNF (31)  Provider: Marlowe Sax FNP-C    PCP: Jerlyn Ly, MD Patient Care Team: Crist Infante, MD as PCP - General (Internal Medicine)  Extended Emergency Contact Information Primary Emergency Contact: Butler,Brenda Address: [address] [place] [employer], [place] [address] [name] of [place] Phone: [phone] Mobile Phone: [phone] Relation: Daughter Secondary Emergency Contact: [name],Gene Address: [address]          [place], [place] [address] [place] of [place] Phone: [phone] Mobile Phone: [phone] Relation: Son  Code Status: Full Code Goals of care:  Advanced Directive information Advanced Directives 07/23/2015  Does patient have an advance directive? Yes  Type of Advance Directive -  Would patient like information on creating an advanced directive? -     Allergies  Allergen Reactions  . Sulfonamide Derivatives Rash    "it was all over my legs"  . Citalopram Hydrobromide     Pt does not remember reaction    Chief Complaint  Patient presents with  . Discharge Note    HPI:  82 y.o. female seen today at  [facility] for discharge home. She was here for short term short term rehabilitation post hospital admission from 06/27/15-07/01/15 with acute respiratory failure with community acquired pneumonia.She has a medical History of HTN, CHF, Afib, Sick sinus syndrome, Hyperlipidemia among others. She completed a course of antibiotics and T[place]red Prednisone here at  Rehab. Repeat CXR 07/09/2015 showed no acute pulmonary abnormality. She is now off oxygen via nasal cannula. She is seen in her room today. She denies any acute issues this visit. She has worked well with PT/OT now stable for discharge home with Home health PT/OT to continue with ROM, Exercise, Gait stability and muscle strengthening. She does not require  any DME states has own walker.  Home health services will be arranged by facility social worker prior to discharge. Prescription medication will be written x 1 month then patient to follow up with PCP in 1-2 weeks. Facility staff report no new concerns.      Past Medical History  Diagnosis Date  . Moderate aortic insufficiency     a. 06/2011 Echo: EF 60-65%, mild LVH, no rwma, mod AI.  Marland Kitchen Anxiety   . Essential hypertension   . PAF (paroxysmal atrial fibrillation) (HCC)     a. chronic flecainide->reduced to 25 bid 08/2014.  . SSS (sick sinus syndrome) (HCC)     a. s/p pacemaker. b. s/p Medtronic gen change 07/2011; c.   . Type II diabetes mellitus ([place])     "controlled w/diet"  . Skin cancer ?1960's    "between breasts"  . Arthritis     "hands; between shoulders; back; feet"  . Panic attacks     "since losing husband 1994"  . Depression   . High risk medication use     on Flecainide  . Chronic anticoagulation     -->Eliquis  . Osteoporosis   . Chronic diastolic CHF (congestive heart failure) ([place])     a. 06/2011 Echo: EF 60-65%.  . Kidney stone     passed in [place] 2014  . Osteoporosis     Past Surgical History  Procedure Laterality Date  . Cesarean section  1955  . Cardiovascular stress test  12/09/2004  . Cataract extraction w/ intraocular lens  implant, bilateral  1990's  .  Tonsillectomy      "school age"  . Appendectomy  1955  . Dilation and curettage of uterus  1953  . Breast cyst excision  ~ 1950    right  . Insert / replace / remove pacemaker  03/16/2006  . Skin cancer excision  ?1960's    "between my breasts"  . Permanent pacemaker generator change  08/03/2011    Procedure: PERMANENT PACEMAKER GENERATOR CHANGE;  Surgeon: Thompson Grayer, MD;  Location: [facility] CATH LAB;  Service: Cardiovascular;;  . Breast lumpectomy with needle localization Bilateral 02/03/2014    Procedure: BILATERAL BREAST LUMPECTOMY WITH NEEDLE LOCALIZATION ON LEFT;  Surgeon: Excell Seltzer, MD;  Location: [facility];  Service: General;   Laterality: Bilateral;      reports that she has never smoked. She has never used smokeless tobacco. She reports that she does not drink alcohol or use illicit drugs. Social History   Social History  . Marital Status: Widowed    Spouse Name: N/A  . Number of Children: 2  . Years of Education: N/A   Occupational History  . Not on file.   Social History Main Topics  . Smoking status: Never Smoker   . Smokeless tobacco: Never Used  . Alcohol Use: No  . Drug Use: No  . Sexual Activity: No   Other Topics Concern  . Not on file   Social History Narrative   Lives alone   Functional Status Survey:    Allergies  Allergen Reactions  . Sulfonamide Derivatives Rash    "it was all over my legs"  . Citalopram Hydrobromide     Pt does not remember reaction    Pertinent  Health Maintenance Due  Topic Date Due  . FOOT EXAM  06/15/1927  . OPHTHALMOLOGY EXAM  [birth year]29  . URINE MICROALBUMIN  06[birth year]15/1929  . DEXA SCAN  01/25/2023 (Originally 06/15/1984)  . PNA vac Low Risk Adult (2 of 2 - PCV13) 01/25/2023 (Originally 09/24/2008)  . INFLUENZA VACCINE  08/25/2015  . HEMOGLOBIN A1C  12/28/2015    Medications:   Medication List       This list is accurate as of: 07/23/15  9:51 AM.  Always use your most recent med list.               acetaminophen 325 MG tablet  Commonly known as:  TYLENOL  Take 325 mg by mouth at bedtime as needed for moderate pain (back pain).     apixaban 2.5 MG Tabs tablet  Commonly known as:  ELIQUIS  Take 1 tablet (2.5 mg total) by mouth 2 (two) times daily.     azelastine 0.1 % nasal spray  Commonly known as:  ASTELIN  Place 1 spray into both nostrils daily as needed for rhinitis. Use in each nostril as directed     carvedilol 6.25 MG tablet  Commonly known as:  COREG  Take 1 tablet (6.25 mg total) by mouth 2 (two) times daily.     CRESTOR 5 MG tablet  Generic drug:  rosuvastatin  Take 2.5 mg by mouth 3 (three) times a week. Monday, Wednesday  and Friday     diltiazem 240 MG 24 hr capsule  Commonly known as:  CARDIZEM CD  Take 240 mg by mouth daily.     docusate sodium 100 MG capsule  Commonly known as:  COLACE  Take 100 mg by mouth 2 (two) times daily.     insulin detemir 100 UNIT/ML injection  Commonly known as:  LEVEMIR  Inject 0.1 mLs (10 Units total) into the skin at bedtime.     insulin lispro 100 UNIT/ML injection  Commonly known as:  HUMALOG  as directed. SSI 150-250 (5 units), 251-300 (8 units), 301-350(10 units), > 350 call provider     LORazepam 0.5 MG tablet  Commonly known as:  ATIVAN  Take 0.5 mg by mouth every 8 (eight) hours as needed for anxiety.     polyethylene glycol packet  Commonly known as:  MIRALAX / GLYCOLAX  Take 17 g by mouth 2 (two) times daily. Hold for loose stool. Mix in 4-8 oz liquid     sertraline 50 MG tablet  Commonly known as:  ZOLOFT  Take 50 mg by mouth daily.     UNABLE TO FIND  Med Name: Medpass  Take 120 cc by mouth daily for protein malnutrition     Vitamin D (Ergocalciferol) 50000 units Caps capsule  Commonly known as:  DRISDOL  Take 50,000 Units by mouth every Wednesday.        Review of Systems  Constitutional: Negative for fever, chills, activity change and appetite change.  HENT: Positive for hearing loss. Negative for congestion, rhinorrhea, sinus pressure, sneezing and sore throat.   Eyes: Negative.   Respiratory: Negative for cough, chest tightness, shortness of breath and wheezing.   Cardiovascular: Negative for chest pain, palpitations and leg swelling.  Gastrointestinal: Negative for nausea, vomiting, constipation and abdominal distention.  Endocrine: Negative.   Genitourinary: Negative for dysuria, urgency, frequency and flank pain.  Musculoskeletal: Positive for gait problem.  Skin: Negative.   Neurological: Negative for dizziness, weakness, light-headedness and headaches.  Psychiatric/Behavioral: Negative for hallucinations, confusion, sleep  disturbance and agitation. The patient is not nervous/anxious.     Filed Vitals:   07/23/15 0927  BP: 103/64  Pulse: 73  Temp: 97.6 F (36.4 C)  Resp: 18  Height: 5\' 1"  (1.549 m)  Weight: 119 lb (53.978 kg)  SpO2: 93%   Body mass index is 22.5 kg/(m^2). Physical Exam  Constitutional: She is oriented to person, place, and time. She appears well-developed and well-nourished. No distress.  HENT:  Head: Normocephalic.  Mouth/Throat: Oropharynx is clear and moist. No oropharyngeal exudate.  HOH   Eyes: Conjunctivae and EOM are normal. Pupils are equal, round, and reactive to light. Right eye exhibits no discharge. Left eye exhibits no discharge. No scleral icterus.  Neck: Normal range of motion. No JVD present. No thyromegaly present.  Cardiovascular: Normal rate, regular rhythm and intact distal pulses.  Exam reveals no gallop and no friction rub.   No murmur heard. Pulmonary/Chest: Effort normal and breath sounds normal. No respiratory distress. She has no wheezes. She has no rales.  Abdominal: Soft. Bowel sounds are normal. She exhibits no distension. There is no tenderness. There is no rebound and no guarding.  Musculoskeletal: She exhibits no tenderness.  Lymphadenopathy:    She has no cervical adenopathy.  Neurological: She is oriented to person, place, and time.  Skin: Skin is dry. No rash noted. No erythema. No pallor.  Psychiatric: She has a normal mood and affect.    Labs reviewed: Basic Metabolic Panel:  Recent Labs  06/28/15 0402 06/30/15 0647 07/01/15 0549 07/06/15  NA 139 138 139 142  K 3.7 3.3* 4.4 4.2  CL 106 101 100*  --   CO2 24 28 29   --   GLUCOSE 337* 129* 128*  --   BUN 14 19 22* 21  CREATININE 0.65 0.65 0.68 0.8  CALCIUM  9.2 9.3 9.9  --    Liver Function Tests:  Recent Labs  06/27/15 1902 06/30/15 0647 07/06/15  AST 17 16 16   ALT 14 13* 28  ALKPHOS 63 54 58  BILITOT 0.6 0.6  --   PROT 6.6 6.0*  --   ALBUMIN 3.6 3.2*  --    No results  for input(s): LIPASE, AMYLASE in the last 8760 hours. No results for input(s): AMMONIA in the last 8760 hours. CBC:  Recent Labs  06/27/15 1902 06/28/15 0402 06/30/15 0647 07/01/15 0549 07/06/15 07/09/15  WBC 8.0 4.7 14.1* 12.5* 14.1 14.8  NEUTROABS 6.8  --   --   --   --   --   HGB 13.1 12.8 12.6 12.9 12.9 12.6  HCT 39.1 38.0 39.2 39.4 39 38  MCV 91.4 90.9 91.4 91.6  --   --   PLT 164 138* 189 195 273 263   Cardiac Enzymes: No results for input(s): CKTOTAL, CKMB, CKMBINDEX, TROPONINI in the last 8760 hours. BNP: Invalid input(s): POCBNP CBG:  Recent Labs  07/01/15 0719 07/01/15 1144 07/01/15 1611  GLUCAP 107* 271* 258*    Procedures and Imaging Studies During Stay: Dg Chest 2 View  06/27/2015  CLINICAL DATA:  Shortness of breath EXAM: CHEST  2 VIEW COMPARISON:  August 14, 2013 FINDINGS: There is new infiltrate in the right mid lung. The heart, hila, mediastinum, lungs, and pleura are otherwise unremarkable. No pneumothorax. IMPRESSION: New infiltrate in the right mid lung. Recommend follow-up to resolution. Electronically Signed   By: Dorise Bullion III M.D   On: 06/27/2015 18:15   Dg Chest Port 1 View  06/29/2015  CLINICAL DATA:  Recent admission for respiratory failure and pneumonia. EXAM: PORTABLE CHEST 1 VIEW COMPARISON:  06/27/2015. FINDINGS: Trachea is midline. Heart size stable. Pacemaker lead tips object over the right atrium and right ventricle. There is airspace opacification in the right upper lobe, similar. Minimal streaky atelectasis or scarring in the left lower lobe. No pleural fluid. IMPRESSION: Patchy airspace opacification in the right upper lobe K[place] be due to pneumonia. Followup PA and lateral chest X-ray is recommended in 3-4 weeks following trial of antibiotic therapy to ensure resolution and exclude underlying malignancy. Electronically Signed   By: Lorin Picket M.D.   On: 06/29/2015 16:47   Dg Abd Portable 1v  06/27/2015  CLINICAL DATA:  Abdominal  distention EXAM: PORTABLE ABDOMEN - 1 VIEW COMPARISON:  07/06/2012 abdominal CT FINDINGS: Motion degraded study. Gas scattered throughout nondilated colon and small bowel. No abnormal stool retention. No concerning intra-abdominal mass effect or calcification. Grossly clear basilar lungs. IMPRESSION: Nonobstructive bowel gas pattern. Electronically Signed   By: Monte Fantasia M.D.   On: 06/27/2015 23:51    Assessment/Plan:   CHF Stable. No recent weight gain, edema, wheezing or shortness of breath. Continue on Coreg 6.25 mg tablet.   Afib HR controlled. Continue on Eliquis 2.5 mg Tablet. Follows up with Cardiologist Dr. Rayann Heman last seen 6/ 21/2017  Dyslipidemia  Continue on Crestor.    Constipation  Current regimen effective.  Anxiety Stable. Continue on Lorazepam   CAP Afebrile. Completed course of antibiotics and Prednisone upon discharge from the hospital. Current off oxygen. Repeat CXR negative.Had elevated WBC 14.8 (07/09/2015). Will recheck CBC prior to discharge then follow up with PCP in 1-2 weeks.    Abnormal Gait  Has worked well with PT/OT now stable for discharge home with Home health PT/OT to continue with ROM, Exercise, Gait stability and muscle  strengthening. She does not require  any DME states has own walker.Fall and safety precaution advised.   Patient is being discharged with the following home health services:   PT/OT to continue with ROM, Exercise, Gait stability and muscle strengthening.   Patient is being discharged with the following durable medical equipment:   She does not require  any DME states has own walker.  Patient has been advised to f/u with their PCP in 1-2 weeks to bring them up to date on their rehab stay.  Social services at facility was responsible for arranging this appointment.  Pt was provided with a 30 day supply of prescriptions for medications and refills must be obtained from their PCP.  For controlled substances, a more limited supply [place]  be provided adequate until PCP appointment only.  Future labs/tests needed: CBC, BMP in 1-2 weeks with PCP

## 2015-07-30 DIAGNOSIS — I5032 Chronic diastolic (congestive) heart failure: Secondary | ICD-10-CM | POA: Diagnosis not present

## 2015-07-30 DIAGNOSIS — Z950 Presence of cardiac pacemaker: Secondary | ICD-10-CM | POA: Diagnosis not present

## 2015-07-30 DIAGNOSIS — I110 Hypertensive heart disease with heart failure: Secondary | ICD-10-CM | POA: Diagnosis not present

## 2015-07-30 DIAGNOSIS — Z7901 Long term (current) use of anticoagulants: Secondary | ICD-10-CM | POA: Diagnosis not present

## 2015-07-30 DIAGNOSIS — E1139 Type 2 diabetes mellitus with other diabetic ophthalmic complication: Secondary | ICD-10-CM | POA: Diagnosis not present

## 2015-07-30 DIAGNOSIS — F410 Panic disorder [episodic paroxysmal anxiety] without agoraphobia: Secondary | ICD-10-CM | POA: Diagnosis not present

## 2015-07-30 DIAGNOSIS — H353130 Nonexudative age-related macular degeneration, bilateral, stage unspecified: Secondary | ICD-10-CM | POA: Diagnosis not present

## 2015-07-30 DIAGNOSIS — I495 Sick sinus syndrome: Secondary | ICD-10-CM | POA: Diagnosis not present

## 2015-07-30 DIAGNOSIS — I480 Paroxysmal atrial fibrillation: Secondary | ICD-10-CM | POA: Diagnosis not present

## 2015-07-30 DIAGNOSIS — R262 Difficulty in walking, not elsewhere classified: Secondary | ICD-10-CM | POA: Diagnosis not present

## 2015-07-30 DIAGNOSIS — E785 Hyperlipidemia, unspecified: Secondary | ICD-10-CM | POA: Diagnosis not present

## 2015-07-30 DIAGNOSIS — M810 Age-related osteoporosis without current pathological fracture: Secondary | ICD-10-CM | POA: Diagnosis not present

## 2015-07-31 DIAGNOSIS — E784 Other hyperlipidemia: Secondary | ICD-10-CM | POA: Diagnosis not present

## 2015-07-31 DIAGNOSIS — M1990 Unspecified osteoarthritis, unspecified site: Secondary | ICD-10-CM | POA: Diagnosis not present

## 2015-07-31 DIAGNOSIS — Z8679 Personal history of other diseases of the circulatory system: Secondary | ICD-10-CM | POA: Diagnosis not present

## 2015-07-31 DIAGNOSIS — I10 Essential (primary) hypertension: Secondary | ICD-10-CM | POA: Diagnosis not present

## 2015-07-31 DIAGNOSIS — I480 Paroxysmal atrial fibrillation: Secondary | ICD-10-CM | POA: Diagnosis not present

## 2015-07-31 DIAGNOSIS — J189 Pneumonia, unspecified organism: Secondary | ICD-10-CM | POA: Diagnosis not present

## 2015-07-31 DIAGNOSIS — Z6822 Body mass index (BMI) 22.0-22.9, adult: Secondary | ICD-10-CM | POA: Diagnosis not present

## 2015-07-31 DIAGNOSIS — Z950 Presence of cardiac pacemaker: Secondary | ICD-10-CM | POA: Diagnosis not present

## 2015-07-31 DIAGNOSIS — C50919 Malignant neoplasm of unspecified site of unspecified female breast: Secondary | ICD-10-CM | POA: Diagnosis not present

## 2015-07-31 DIAGNOSIS — E119 Type 2 diabetes mellitus without complications: Secondary | ICD-10-CM | POA: Diagnosis not present

## 2015-08-04 DIAGNOSIS — I495 Sick sinus syndrome: Secondary | ICD-10-CM | POA: Diagnosis not present

## 2015-08-04 DIAGNOSIS — I5032 Chronic diastolic (congestive) heart failure: Secondary | ICD-10-CM | POA: Diagnosis not present

## 2015-08-04 DIAGNOSIS — I110 Hypertensive heart disease with heart failure: Secondary | ICD-10-CM | POA: Diagnosis not present

## 2015-08-04 DIAGNOSIS — I480 Paroxysmal atrial fibrillation: Secondary | ICD-10-CM | POA: Diagnosis not present

## 2015-08-04 DIAGNOSIS — M810 Age-related osteoporosis without current pathological fracture: Secondary | ICD-10-CM | POA: Diagnosis not present

## 2015-08-04 DIAGNOSIS — R262 Difficulty in walking, not elsewhere classified: Secondary | ICD-10-CM | POA: Diagnosis not present

## 2015-08-06 DIAGNOSIS — I110 Hypertensive heart disease with heart failure: Secondary | ICD-10-CM | POA: Diagnosis not present

## 2015-08-06 DIAGNOSIS — R262 Difficulty in walking, not elsewhere classified: Secondary | ICD-10-CM | POA: Diagnosis not present

## 2015-08-06 DIAGNOSIS — M810 Age-related osteoporosis without current pathological fracture: Secondary | ICD-10-CM | POA: Diagnosis not present

## 2015-08-06 DIAGNOSIS — I495 Sick sinus syndrome: Secondary | ICD-10-CM | POA: Diagnosis not present

## 2015-08-06 DIAGNOSIS — I5032 Chronic diastolic (congestive) heart failure: Secondary | ICD-10-CM | POA: Diagnosis not present

## 2015-08-06 DIAGNOSIS — I480 Paroxysmal atrial fibrillation: Secondary | ICD-10-CM | POA: Diagnosis not present

## 2015-08-11 DIAGNOSIS — I495 Sick sinus syndrome: Secondary | ICD-10-CM | POA: Diagnosis not present

## 2015-08-11 DIAGNOSIS — I5032 Chronic diastolic (congestive) heart failure: Secondary | ICD-10-CM | POA: Diagnosis not present

## 2015-08-11 DIAGNOSIS — I480 Paroxysmal atrial fibrillation: Secondary | ICD-10-CM | POA: Diagnosis not present

## 2015-08-11 DIAGNOSIS — M810 Age-related osteoporosis without current pathological fracture: Secondary | ICD-10-CM | POA: Diagnosis not present

## 2015-08-11 DIAGNOSIS — I110 Hypertensive heart disease with heart failure: Secondary | ICD-10-CM | POA: Diagnosis not present

## 2015-08-11 DIAGNOSIS — R262 Difficulty in walking, not elsewhere classified: Secondary | ICD-10-CM | POA: Diagnosis not present

## 2015-08-13 DIAGNOSIS — I480 Paroxysmal atrial fibrillation: Secondary | ICD-10-CM | POA: Diagnosis not present

## 2015-08-13 DIAGNOSIS — R262 Difficulty in walking, not elsewhere classified: Secondary | ICD-10-CM | POA: Diagnosis not present

## 2015-08-13 DIAGNOSIS — I495 Sick sinus syndrome: Secondary | ICD-10-CM | POA: Diagnosis not present

## 2015-08-13 DIAGNOSIS — M810 Age-related osteoporosis without current pathological fracture: Secondary | ICD-10-CM | POA: Diagnosis not present

## 2015-08-13 DIAGNOSIS — I110 Hypertensive heart disease with heart failure: Secondary | ICD-10-CM | POA: Diagnosis not present

## 2015-08-13 DIAGNOSIS — I5032 Chronic diastolic (congestive) heart failure: Secondary | ICD-10-CM | POA: Diagnosis not present

## 2015-08-17 DIAGNOSIS — I110 Hypertensive heart disease with heart failure: Secondary | ICD-10-CM | POA: Diagnosis not present

## 2015-08-17 DIAGNOSIS — I495 Sick sinus syndrome: Secondary | ICD-10-CM | POA: Diagnosis not present

## 2015-08-17 DIAGNOSIS — M810 Age-related osteoporosis without current pathological fracture: Secondary | ICD-10-CM | POA: Diagnosis not present

## 2015-08-17 DIAGNOSIS — I480 Paroxysmal atrial fibrillation: Secondary | ICD-10-CM | POA: Diagnosis not present

## 2015-08-17 DIAGNOSIS — R262 Difficulty in walking, not elsewhere classified: Secondary | ICD-10-CM | POA: Diagnosis not present

## 2015-08-17 DIAGNOSIS — I5032 Chronic diastolic (congestive) heart failure: Secondary | ICD-10-CM | POA: Diagnosis not present

## 2015-08-19 DIAGNOSIS — I5032 Chronic diastolic (congestive) heart failure: Secondary | ICD-10-CM | POA: Diagnosis not present

## 2015-08-19 DIAGNOSIS — R262 Difficulty in walking, not elsewhere classified: Secondary | ICD-10-CM | POA: Diagnosis not present

## 2015-08-19 DIAGNOSIS — M810 Age-related osteoporosis without current pathological fracture: Secondary | ICD-10-CM | POA: Diagnosis not present

## 2015-08-19 DIAGNOSIS — I110 Hypertensive heart disease with heart failure: Secondary | ICD-10-CM | POA: Diagnosis not present

## 2015-08-19 DIAGNOSIS — I495 Sick sinus syndrome: Secondary | ICD-10-CM | POA: Diagnosis not present

## 2015-08-19 DIAGNOSIS — I480 Paroxysmal atrial fibrillation: Secondary | ICD-10-CM | POA: Diagnosis not present

## 2015-08-25 ENCOUNTER — Encounter: Payer: Self-pay | Admitting: Cardiology

## 2015-08-25 ENCOUNTER — Ambulatory Visit (INDEPENDENT_AMBULATORY_CARE_PROVIDER_SITE_OTHER): Payer: Medicare Other | Admitting: Cardiology

## 2015-08-25 VITALS — BP 118/52 | HR 80 | Ht 62.0 in | Wt 125.2 lb

## 2015-08-25 DIAGNOSIS — I480 Paroxysmal atrial fibrillation: Secondary | ICD-10-CM | POA: Diagnosis not present

## 2015-08-25 DIAGNOSIS — I10 Essential (primary) hypertension: Secondary | ICD-10-CM

## 2015-08-25 DIAGNOSIS — I495 Sick sinus syndrome: Secondary | ICD-10-CM | POA: Diagnosis not present

## 2015-08-25 DIAGNOSIS — I5032 Chronic diastolic (congestive) heart failure: Secondary | ICD-10-CM

## 2015-08-25 DIAGNOSIS — I5033 Acute on chronic diastolic (congestive) heart failure: Secondary | ICD-10-CM | POA: Diagnosis not present

## 2015-08-25 NOTE — Progress Notes (Signed)
Jo [name] Date of Birth: 31-Oct-1917 Medical Record [id number]  History of Present Illness: Jo [name] is seen back today for follow up Afib. She has a history of AI, depression, DM, HTN and PAF with a pacemaker in place. Had been on chronic Flecainide and Eliquis. Flecainide discontinued in April due to visual disturbance and weakness. Last generator change back in 2013. Pacer check in June showed no Afib. She was admitted in early June with CAP. Echo at that time showed normal LV function and only mild AI. She is scheduled for follow up CXR with Dr. Joylene Draft. She spent some time in a nursing facility but is back home now.  On follow up she is doing well.   Denies any chest pain or SOB. No palpitations. She is getting PT twice a week.  Current Outpatient Prescriptions  Medication Sig Dispense Refill  . acetaminophen (TYLENOL) 325 MG tablet Take 325 mg by mouth at bedtime as needed for moderate pain (back pain).    Marland Kitchen apixaban (ELIQUIS) 2.5 MG TABS tablet Take 1 tablet (2.5 mg total) by mouth 2 (two) times daily. 60 tablet 11  . azelastine (ASTELIN) 0.1 % nasal spray Place 1 spray into both nostrils daily as needed for rhinitis. Use in each nostril as directed    . carvedilol (COREG) 6.25 MG tablet Take 1 tablet (6.25 mg total) by mouth 2 (two) times daily. 180 tablet 3  . CRESTOR 5 MG tablet Take 2.5 mg by mouth 3 (three) times a week. Monday, Wednesday and Friday  7  . diltiazem (CARDIZEM CD) 240 MG 24 hr capsule Take 240 mg by mouth daily.     Marland Kitchen docusate sodium (COLACE) 100 MG capsule Take 100 mg by mouth 2 (two) times daily.    Marland Kitchen LORazepam (ATIVAN) 0.5 MG tablet Take 0.5 mg by mouth at bedtime. Take 1/2 Tablet (0.25 mg tablet) in the morning at 9 AM and afternoon at 3 PM    . polyethylene glycol (MIRALAX / GLYCOLAX) packet Take 17 g by mouth 2 (two) times daily. Hold for loose stool. Mix in 4-8 oz liquid    . sertraline (ZOLOFT) 50 MG tablet Take 50 mg by mouth daily.  3  . UNABLE TO FIND Med  Name: Medpass  Take 120 cc by mouth daily for protein malnutrition    . Vitamin D, Ergocalciferol, (DRISDOL) 50000 UNITS CAPS capsule Take 50,000 Units by mouth every Wednesday.     No current facility-administered medications for this visit.     Allergies  Allergen Reactions  . Sulfonamide Derivatives Rash    "it was all over my legs"  . Citalopram Hydrobromide     Pt does not remember reaction    Past Medical History:  Diagnosis Date  . Anxiety   . Arthritis    "hands; between shoulders; back; feet"  . Chronic anticoagulation    -->Eliquis  . Chronic diastolic CHF (congestive heart failure) ([place])    a. 06/2011 Echo: EF 60-65%.  . Depression   . Essential hypertension   . High risk medication use    on Flecainide  . Kidney stone    passed in [place] 2014  . Moderate aortic insufficiency    a. 06/2011 Echo: EF 60-65%, mild LVH, no rwma, mod AI.  Marland Kitchen Osteoporosis   . Osteoporosis   . PAF (paroxysmal atrial fibrillation) (HCC)    a. chronic flecainide->reduced to 25 bid 08/2014.  Marland Kitchen Panic attacks    "since losing husband 1994"  .  Skin cancer ?1960's   "between breasts"  . SSS (sick sinus syndrome) (H[facility])    a. s/p pacemaker. b. s/p Medtronic gen change 07/2011; c.   . Type II diabetes mellitus ([place])    "controlled w/diet"    Past Surgical History:  Procedure Laterality Date  . APPENDECTOMY  1955  . BREAST CYST EXCISION  ~ 1950   right  . BREAST LUMPECTOMY WITH NEEDLE LOCALIZATION Bilateral 02/03/2014   Procedure: BILATERAL BREAST LUMPECTOMY WITH NEEDLE LOCALIZATION ON LEFT;  Surgeon: Excell Seltzer, MD;  Location: [facility];  Service: General;  Laterality: Bilateral;  . CARDIOVASCULAR STRESS TEST  12/09/2004  . CATARACT EXTRACTION W/ INTRAOCULAR LENS  IMPLANT, BILATERAL  1990's  . [place]  . DILATION AND CURETTAGE OF UTERUS  1953  . INSERT / REPLACE / REMOVE PACEMAKER  03/16/2006  . PERMANENT PACEMAKER GENERATOR CHANGE  08/03/2011   Procedure: PERMANENT  PACEMAKER GENERATOR CHANGE;  Surgeon: Thompson Grayer, MD;  Location: [facility] CATH LAB;  Service: Cardiovascular;;  . SKIN CANCER EXCISION  ?1960's   "between my breasts"  . TONSILLECTOMY     "school age"    History  Smoking Status  . Never Smoker  Smokeless Tobacco  . Never Used    History  Alcohol Use No    Family History  Problem Relation Age of Onset  . Emphysema Father     Review of Systems: The review of systems is per the HPI.  All other systems were reviewed and are negative.  Physical Exam: BP (!) 118/52   Pulse 80   Ht 5\' 2"  (1.575 m)   Wt 125 lb 3.2 oz (56.8 kg)   BMI 22.90 kg/m  Patient is very pleasant and in no acute distress. She is hard of hearing. Skin is warm and dry. Color is normal.  HEENT is unremarkable. Normocephalic/atraumatic. PERRL. Sclera are nonicteric. Neck is supple. No masses. No JVD. Lungs are clear. Cardiac exam shows a regular rate and rhythm. Abdomen is soft. Extremities are without edema. Gait and ROM are intact. No gross neurologic deficits noted.  LABORATORY DATA:  Lab Results  Component Value Date   WBC 14.8 07/09/2015   HGB 12.6 07/09/2015   HCT 38 07/09/2015   PLT 263 07/09/2015   GLUCOSE 128 (H) 07/01/2015   ALT 28 07/06/2015   AST 16 07/06/2015   NA 142 07/06/2015   K 4.2 07/06/2015   CL 100 (L) 07/01/2015   CREATININE 0.8 07/06/2015   BUN 21 07/06/2015   CO2 29 07/01/2015   INR 1.41 07/06/2012   HGBA1C 7.6 (H) 06/28/2015     Echo: 07/01/15:Study Conclusions  - Left ventricle: The cavity size was normal. There was mild focal   basal hypertrophy of the septum. Systolic function was normal.   The estimated ejection fraction was in the range of 60% to 65%.   Wall motion was normal; there were no regional wall motion   abnormalities. There was an increased relative contribution of   atrial contraction to ventricular filling. Doppler parameters are   consistent with abnormal left ventricular relaxation (grade 1   diastolic  dysfunction). Doppler parameters are consistent with   high ventricular filling pressure. - Aortic valve: Mildly to moderately calcified annulus. Trileaflet;   mildly thickened, mildly calcified leaflets. There was mild   regurgitation. - Mitral valve: Calcified annulus. There was trivial regurgitation. - Right ventricle: Pacer wire or catheter noted in right ventricle.  Assessment / Plan:  1. PAF - has  PPM in place. Remains on  Eliquis and beta blocker. She is maintaining sinus rhythm well. Will monitor for recurrence with her pacemaker checks. Flecainide discontinued due to side effects.  2. HTN - BP under excellent control. Continue current therapy  3. Chronic Diastolic HF - well compensated today. No change in her current therapy.   4. CAP- patient has recovered well.   I will follow up in 6 months.

## 2015-08-25 NOTE — Patient Instructions (Signed)
Continue your current therapy  I will see you in 6 months.   

## 2015-08-26 DIAGNOSIS — I495 Sick sinus syndrome: Secondary | ICD-10-CM | POA: Diagnosis not present

## 2015-08-26 DIAGNOSIS — M810 Age-related osteoporosis without current pathological fracture: Secondary | ICD-10-CM | POA: Diagnosis not present

## 2015-08-26 DIAGNOSIS — I5032 Chronic diastolic (congestive) heart failure: Secondary | ICD-10-CM | POA: Diagnosis not present

## 2015-08-26 DIAGNOSIS — R262 Difficulty in walking, not elsewhere classified: Secondary | ICD-10-CM | POA: Diagnosis not present

## 2015-08-26 DIAGNOSIS — I480 Paroxysmal atrial fibrillation: Secondary | ICD-10-CM | POA: Diagnosis not present

## 2015-08-26 DIAGNOSIS — I110 Hypertensive heart disease with heart failure: Secondary | ICD-10-CM | POA: Diagnosis not present

## 2015-08-28 DIAGNOSIS — I5032 Chronic diastolic (congestive) heart failure: Secondary | ICD-10-CM | POA: Diagnosis not present

## 2015-08-28 DIAGNOSIS — I495 Sick sinus syndrome: Secondary | ICD-10-CM | POA: Diagnosis not present

## 2015-08-28 DIAGNOSIS — M810 Age-related osteoporosis without current pathological fracture: Secondary | ICD-10-CM | POA: Diagnosis not present

## 2015-08-28 DIAGNOSIS — R262 Difficulty in walking, not elsewhere classified: Secondary | ICD-10-CM | POA: Diagnosis not present

## 2015-08-28 DIAGNOSIS — I110 Hypertensive heart disease with heart failure: Secondary | ICD-10-CM | POA: Diagnosis not present

## 2015-08-28 DIAGNOSIS — I480 Paroxysmal atrial fibrillation: Secondary | ICD-10-CM | POA: Diagnosis not present

## 2015-09-01 DIAGNOSIS — I5032 Chronic diastolic (congestive) heart failure: Secondary | ICD-10-CM | POA: Diagnosis not present

## 2015-09-01 DIAGNOSIS — I110 Hypertensive heart disease with heart failure: Secondary | ICD-10-CM | POA: Diagnosis not present

## 2015-09-01 DIAGNOSIS — I495 Sick sinus syndrome: Secondary | ICD-10-CM | POA: Diagnosis not present

## 2015-09-01 DIAGNOSIS — R262 Difficulty in walking, not elsewhere classified: Secondary | ICD-10-CM | POA: Diagnosis not present

## 2015-09-01 DIAGNOSIS — I480 Paroxysmal atrial fibrillation: Secondary | ICD-10-CM | POA: Diagnosis not present

## 2015-09-01 DIAGNOSIS — M810 Age-related osteoporosis without current pathological fracture: Secondary | ICD-10-CM | POA: Diagnosis not present

## 2015-09-03 DIAGNOSIS — I5032 Chronic diastolic (congestive) heart failure: Secondary | ICD-10-CM | POA: Diagnosis not present

## 2015-09-03 DIAGNOSIS — M810 Age-related osteoporosis without current pathological fracture: Secondary | ICD-10-CM | POA: Diagnosis not present

## 2015-09-03 DIAGNOSIS — I480 Paroxysmal atrial fibrillation: Secondary | ICD-10-CM | POA: Diagnosis not present

## 2015-09-03 DIAGNOSIS — I110 Hypertensive heart disease with heart failure: Secondary | ICD-10-CM | POA: Diagnosis not present

## 2015-09-03 DIAGNOSIS — R262 Difficulty in walking, not elsewhere classified: Secondary | ICD-10-CM | POA: Diagnosis not present

## 2015-09-03 DIAGNOSIS — I495 Sick sinus syndrome: Secondary | ICD-10-CM | POA: Diagnosis not present

## 2015-09-08 DIAGNOSIS — I480 Paroxysmal atrial fibrillation: Secondary | ICD-10-CM | POA: Diagnosis not present

## 2015-09-08 DIAGNOSIS — M810 Age-related osteoporosis without current pathological fracture: Secondary | ICD-10-CM | POA: Diagnosis not present

## 2015-09-08 DIAGNOSIS — I5032 Chronic diastolic (congestive) heart failure: Secondary | ICD-10-CM | POA: Diagnosis not present

## 2015-09-08 DIAGNOSIS — R262 Difficulty in walking, not elsewhere classified: Secondary | ICD-10-CM | POA: Diagnosis not present

## 2015-09-08 DIAGNOSIS — I495 Sick sinus syndrome: Secondary | ICD-10-CM | POA: Diagnosis not present

## 2015-09-08 DIAGNOSIS — I110 Hypertensive heart disease with heart failure: Secondary | ICD-10-CM | POA: Diagnosis not present

## 2015-09-09 DIAGNOSIS — Z961 Presence of intraocular lens: Secondary | ICD-10-CM | POA: Diagnosis not present

## 2015-09-09 DIAGNOSIS — H353190 Nonexudative age-related macular degeneration, unspecified eye, stage unspecified: Secondary | ICD-10-CM | POA: Diagnosis not present

## 2015-09-09 DIAGNOSIS — H02834 Dermatochalasis of left upper eyelid: Secondary | ICD-10-CM | POA: Diagnosis not present

## 2015-09-09 DIAGNOSIS — H02831 Dermatochalasis of right upper eyelid: Secondary | ICD-10-CM | POA: Diagnosis not present

## 2015-09-11 DIAGNOSIS — I480 Paroxysmal atrial fibrillation: Secondary | ICD-10-CM | POA: Diagnosis not present

## 2015-09-11 DIAGNOSIS — I5032 Chronic diastolic (congestive) heart failure: Secondary | ICD-10-CM | POA: Diagnosis not present

## 2015-09-11 DIAGNOSIS — I495 Sick sinus syndrome: Secondary | ICD-10-CM | POA: Diagnosis not present

## 2015-09-11 DIAGNOSIS — M810 Age-related osteoporosis without current pathological fracture: Secondary | ICD-10-CM | POA: Diagnosis not present

## 2015-09-11 DIAGNOSIS — R262 Difficulty in walking, not elsewhere classified: Secondary | ICD-10-CM | POA: Diagnosis not present

## 2015-09-11 DIAGNOSIS — I110 Hypertensive heart disease with heart failure: Secondary | ICD-10-CM | POA: Diagnosis not present

## 2015-09-16 DIAGNOSIS — J189 Pneumonia, unspecified organism: Secondary | ICD-10-CM | POA: Diagnosis not present

## 2015-10-14 ENCOUNTER — Ambulatory Visit (INDEPENDENT_AMBULATORY_CARE_PROVIDER_SITE_OTHER): Payer: Medicare Other | Admitting: *Deleted

## 2015-10-14 DIAGNOSIS — I495 Sick sinus syndrome: Secondary | ICD-10-CM

## 2015-10-15 NOTE — Progress Notes (Signed)
Remote pacemaker transmission.   

## 2015-10-16 ENCOUNTER — Encounter: Payer: Self-pay | Admitting: Cardiology

## 2015-11-03 LAB — CUP PACEART REMOTE DEVICE CHECK
Battery Remaining Longevity: 120 mo
Brady Statistic AS VS Percent: 5 %
Date Time Interrogation Session: 20170920132826
Implantable Lead Location: 753859
Lead Channel Pacing Threshold Amplitude: 0.875 V
Lead Channel Pacing Threshold Pulse Width: 0.4 ms
Lead Channel Setting Pacing Amplitude: 2 V
Lead Channel Setting Pacing Pulse Width: 0.4 ms
Lead Channel Setting Sensing Sensitivity: 2 mV
MDC IDC LEAD IMPLANT DT: 20080221
MDC IDC LEAD IMPLANT DT: 20080221
MDC IDC LEAD LOCATION: 753860
MDC IDC MSMT BATTERY IMPEDANCE: 207 Ohm
MDC IDC MSMT BATTERY VOLTAGE: 2.8 V
MDC IDC MSMT LEADCHNL RA IMPEDANCE VALUE: 514 Ohm
MDC IDC MSMT LEADCHNL RA PACING THRESHOLD AMPLITUDE: 0.5 V
MDC IDC MSMT LEADCHNL RA PACING THRESHOLD PULSEWIDTH: 0.4 ms
MDC IDC MSMT LEADCHNL RV IMPEDANCE VALUE: 415 Ohm
MDC IDC MSMT LEADCHNL RV SENSING INTR AMPL: 5.6 mV
MDC IDC SET LEADCHNL RV PACING AMPLITUDE: 2.5 V
MDC IDC STAT BRADY AP VP PERCENT: 0 %
MDC IDC STAT BRADY AP VS PERCENT: 95 %
MDC IDC STAT BRADY AS VP PERCENT: 0 %

## 2015-11-07 ENCOUNTER — Other Ambulatory Visit: Payer: Self-pay | Admitting: Internal Medicine

## 2015-11-09 NOTE — Telephone Encounter (Signed)
Dr Jordan pt 

## 2015-11-09 NOTE — Telephone Encounter (Signed)
Rx has been sent to the pharmacy electronically. ° °

## 2015-11-17 ENCOUNTER — Encounter (HOSPITAL_COMMUNITY): Payer: Self-pay | Admitting: Emergency Medicine

## 2015-11-17 ENCOUNTER — Emergency Department (HOSPITAL_COMMUNITY)
Admission: EM | Admit: 2015-11-17 | Discharge: 2015-11-17 | Disposition: A | Discharge status: Home / Self Care | Payer: Medicare Other | Attending: Emergency Medicine | Admitting: Emergency Medicine

## 2015-11-17 ENCOUNTER — Emergency Department (HOSPITAL_COMMUNITY): Payer: Medicare Other

## 2015-11-17 DIAGNOSIS — Z7901 Long term (current) use of anticoagulants: Secondary | ICD-10-CM | POA: Diagnosis not present

## 2015-11-17 DIAGNOSIS — R55 Syncope and collapse: Secondary | ICD-10-CM

## 2015-11-17 DIAGNOSIS — R42 Dizziness and giddiness: Secondary | ICD-10-CM | POA: Diagnosis present

## 2015-11-17 DIAGNOSIS — Z85820 Personal history of malignant melanoma of skin: Secondary | ICD-10-CM | POA: Insufficient documentation

## 2015-11-17 DIAGNOSIS — E119 Type 2 diabetes mellitus without complications: Secondary | ICD-10-CM | POA: Diagnosis not present

## 2015-11-17 DIAGNOSIS — R002 Palpitations: Secondary | ICD-10-CM | POA: Insufficient documentation

## 2015-11-17 DIAGNOSIS — R030 Elevated blood-pressure reading, without diagnosis of hypertension: Secondary | ICD-10-CM | POA: Diagnosis not present

## 2015-11-17 DIAGNOSIS — I5032 Chronic diastolic (congestive) heart failure: Secondary | ICD-10-CM | POA: Diagnosis not present

## 2015-11-17 DIAGNOSIS — I110 Hypertensive heart disease with heart failure: Secondary | ICD-10-CM | POA: Diagnosis not present

## 2015-11-17 DIAGNOSIS — J439 Emphysema, unspecified: Secondary | ICD-10-CM | POA: Diagnosis not present

## 2015-11-17 DIAGNOSIS — Z950 Presence of cardiac pacemaker: Secondary | ICD-10-CM | POA: Diagnosis not present

## 2015-11-17 DIAGNOSIS — I4891 Unspecified atrial fibrillation: Secondary | ICD-10-CM | POA: Diagnosis not present

## 2015-11-17 LAB — BASIC METABOLIC PANEL
ANION GAP: 6 (ref 5–15)
BUN: 18 mg/dL (ref 6–20)
CHLORIDE: 108 mmol/L (ref 101–111)
CO2: 28 mmol/L (ref 22–32)
Calcium: 10.2 mg/dL (ref 8.9–10.3)
Creatinine, Ser: 0.7 mg/dL (ref 0.44–1.00)
GFR calc Af Amer: >60 mL/min (ref >60)
GLUCOSE: 119 mg/dL — AB (ref 65–99)
POTASSIUM: 4.6 mmol/L (ref 3.5–5.1)
Sodium: 142 mmol/L (ref 135–145)

## 2015-11-17 LAB — CBC
HEMATOCRIT: 41.3 % (ref 36.0–46.0)
HEMOGLOBIN: 14.1 g/dL (ref 12.0–15.0)
MCH: 31.5 pg (ref 26.0–34.0)
MCHC: 34.1 g/dL (ref 30.0–36.0)
MCV: 92.2 fL (ref 78.0–100.0)
Platelets: 198 10*3/uL (ref 150–400)
RBC: 4.48 MIL/uL (ref 3.87–5.11)
RDW: 12.7 % (ref 11.5–15.5)
WBC: 10.1 10*3/uL (ref 4.0–10.5)

## 2015-11-17 LAB — I-STAT TROPONIN, ED: Troponin i, poc: 0.03 ng/mL (ref 0.00–0.08)

## 2015-11-17 NOTE — Discharge Instructions (Signed)
Call your cardiologist in the morning.

## 2015-11-17 NOTE — ED Notes (Signed)
MD at bedside. 

## 2015-11-17 NOTE — ED Provider Notes (Signed)
[facility] DEPT Provider Note   CSN: [id number] Arrival date & time: 11/17/15  1359     History   Chief Complaint Chief Complaint  Patient presents with  . Palpitations    HPI Jo [name] is a 82 y.o. female.  82 yo F with a cc of palpitations light headedness, weakness lasting for seconds.  Going on for past couple weeks.  Some sob with these events. Denies syncope.  Denies lower extremity edema. Denies exertional symptoms. Denies cough congestion fevers. Has been eating and drinking normally.   The history is provided by the patient.  Palpitations   This is a new problem. The current episode started less than 1 hour ago. The problem occurs constantly. The problem has not changed since onset.Associated symptoms include weakness, cough (chronic) and shortness of breath (during palpitations). Pertinent negatives include no fever, no chest pain, no nausea, no vomiting, no headaches and no dizziness.    Past Medical History:  Diagnosis Date  . Anxiety   . Arthritis    "hands; between shoulders; back; feet"  . Chronic anticoagulation    -->Eliquis  . Chronic diastolic CHF (congestive heart failure) ([place])    a. 06/2011 Echo: EF 60-65%.  . Depression   . Essential hypertension   . High risk medication use    on Flecainide  . Kidney stone    passed in [place] 2014  . Moderate aortic insufficiency    a. 06/2011 Echo: EF 60-65%, mild LVH, no rwma, mod AI.  Marland Kitchen Osteoporosis   . Osteoporosis   . PAF (paroxysmal atrial fibrillation) (HCC)    a. chronic flecainide->reduced to 25 bid 08/2014.  Marland Kitchen Panic attacks    "since losing husband 1994"  . Skin cancer ?1960's   "between breasts"  . SSS (sick sinus syndrome) (HCC)    a. s/p pacemaker. b. s/p Medtronic gen change 07/2011; c.   . Type II diabetes mellitus ([place])    "controlled w/diet"    Patient Active Problem List   Diagnosis Date Noted  . Abdominal distention   . CAP (community acquired pneumonia) 06/27/2015  . Sepsis ([place])  06/27/2015  . Pacemaker 06/27/2015  . Moderate aortic insufficiency   . Chronic diastolic CHF (congestive heart failure) ([place])   . Breast cancer ([place]) 02/03/2014  . Dyslipidemia 08/15/2012  . Atrophic vaginitis 08/15/2012  . Constipation 08/15/2012  . Vitamin D intoxication 08/15/2012  . Gram negative septicemia ([place]) 07/07/2012  . Acute on chronic diastolic HF (heart failure) ([place]) 07/06/2012  . Atrial fibrillation with RVR ([place]) 07/21/2011  . Diabetes mellitus, type II ([place])   . Panic attacks   . Anxiety   . PAF (paroxysmal atrial fibrillation) ([place])   . SSS (sick sinus syndrome) ([place])   . History of aortic insufficiency   . Hypertension     Past Surgical History:  Procedure Laterality Date  . APPENDECTOMY  1955  . BREAST CYST EXCISION  ~ 1950   right  . BREAST LUMPECTOMY WITH NEEDLE LOCALIZATION Bilateral 02/03/2014   Procedure: BILATERAL BREAST LUMPECTOMY WITH NEEDLE LOCALIZATION ON LEFT;  Surgeon: Excell Seltzer, MD;  Location: [facility];  Service: General;  Laterality: Bilateral;  . CARDIOVASCULAR STRESS TEST  12/09/2004  . CATARACT EXTRACTION W/ INTRAOCULAR LENS  IMPLANT, BILATERAL  1990's  . [place]  . DILATION AND CURETTAGE OF UTERUS  1953  . INSERT / REPLACE / REMOVE PACEMAKER  03/16/2006  . PERMANENT PACEMAKER GENERATOR CHANGE  08/03/2011   Procedure: PERMANENT PACEMAKER GENERATOR  CHANGE;  Surgeon: Thompson Grayer, MD;  Location: [facility] CATH LAB;  Service: Cardiovascular;;  . SKIN CANCER EXCISION  ?1960's   "between my breasts"  . TONSILLECTOMY     "school age"    OB History    No data available       Home Medications    Prior to Admission medications   Medication Sig Start Date End Date Taking? Authorizing Provider  acetaminophen (TYLENOL) 325 MG tablet Take 325 mg by mouth 2 (two) times daily as needed for moderate pain (back pain).    Yes Historical Provider, MD  apixaban (ELIQUIS) 2.5 MG TABS tablet Take 1 tablet (2.5 mg total) by mouth 2 (two)  times daily. 01/28/14  Yes Peter M Martinique, MD  azelastine (ASTELIN) 0.1 % nasal spray Place 1 spray into both nostrils daily as needed for rhinitis. Use in each nostril as directed   Yes Historical Provider, MD  carvedilol (COREG) 6.25 MG tablet TAKE 1 TABLET (6.25 MG TOTAL) BY MOUTH 2 (TWO) TIMES DAILY. 11/09/15  Yes Peter M Martinique, MD  CRESTOR 5 MG tablet Take 2.5 mg by mouth 3 (three) times a week. Monday, Wednesday and Friday 07/25/14  Yes Historical Provider, MD  diltiazem (CARDIZEM CD) 240 MG 24 hr capsule Take 240 mg by mouth daily.  09/16/14   Historical Provider, MD  docusate sodium (COLACE) 100 MG capsule Take 100 mg by mouth 2 (two) times daily.    Historical Provider, MD  LORazepam (ATIVAN) 0.5 MG tablet Take 0.5 mg by mouth at bedtime. Take 1/2 Tablet (0.25 mg tablet) in the morning at 9 AM and afternoon at 3 PM    Historical Provider, MD  polyethylene glycol (MIRALAX / GLYCOLAX) packet Take 17 g by mouth 2 (two) times daily. Hold for loose stool. Mix in 4-8 oz liquid    Historical Provider, MD  sertraline (ZOLOFT) 50 MG tablet Take 50 mg by mouth daily. 07/20/14   Historical Provider, MD  UNABLE TO FIND Med Name: Medpass  Take 120 cc by mouth daily for protein malnutrition    Historical Provider, MD  Vitamin D, Ergocalciferol, (DRISDOL) 50000 UNITS CAPS capsule Take 50,000 Units by mouth every Wednesday.    Historical Provider, MD    Family History Family History  Problem Relation Age of Onset  . Emphysema Father     Social History Social History  Substance Use Topics  . Smoking status: Never Smoker  . Smokeless tobacco: Never Used  . Alcohol use No     Allergies   Sulfonamide derivatives and Citalopram hydrobromide   Review of Systems Review of Systems  Constitutional: Negative for chills and fever.  HENT: Negative for congestion and rhinorrhea.   Eyes: Negative for redness and visual disturbance.  Respiratory: Positive for cough (chronic) and shortness of breath  (during palpitations). Negative for wheezing.   Cardiovascular: Positive for palpitations. Negative for chest pain.  Gastrointestinal: Negative for nausea and vomiting.  Genitourinary: Negative for dysuria and urgency.  Musculoskeletal: Positive for arthralgias (left knee pain). Negative for myalgias.  Skin: Negative for pallor and wound.  Neurological: Positive for weakness. Negative for dizziness and headaches.     Physical Exam Updated Vital Signs BP 191/76   Pulse 62   Temp 97.8 F (36.6 C) (Oral)   Resp 14   SpO2 92%   Physical Exam  Constitutional: She is oriented to person, place, and time. She appears well-developed and well-nourished. No distress.  HENT:  Head: Normocephalic and atraumatic.  Eyes:  EOM are normal. Pupils are equal, round, and reactive to light.  Neck: Normal range of motion. Neck supple.  Cardiovascular: Normal rate and regular rhythm.  Exam reveals no gallop and no friction rub.   No murmur heard. Pulmonary/Chest: Effort normal. She has no wheezes. She has no rales.  Abdominal: Soft. She exhibits no distension and no mass. There is no tenderness. There is no guarding.  Musculoskeletal: She exhibits no edema or tenderness.  Neurological: She is alert and oriented to person, place, and time.  Skin: Skin is warm and dry. She is not diaphoretic.  Psychiatric: She has a normal mood and affect. Her behavior is normal.  Nursing note and vitals reviewed.    ED Treatments / Results  Labs (all labs ordered are listed, but only abnormal results are displayed) Labs Reviewed  BASIC METABOLIC PANEL - Abnormal; Notable for the following:       Result Value   Glucose, Bld 119 (*)    All other components within normal limits  CBC  I-STAT TROPOININ, ED    EKG  EKG Interpretation  Date/Time:  Tuesday November 17 2015 13:59:20 EDT Ventricular Rate:  82 PR Interval:  204 QRS Duration: 78 QT Interval:  370 QTC Calculation: 432 R Axis:   63 Text  Interpretation:  Atrial-paced rhythm Abnormal ECG No significant change since last tracing Confirmed by Ogden Handlin MD, DANIEL [phone]) on 11/17/2015 4:40:17 PM       Radiology Dg Chest 2 View  Result Date: 11/17/2015 CLINICAL DATA:  Heart palpitations.  Pacemaker. EXAM: CHEST  2 VIEW COMPARISON:  One-view chest x-ray 06/29/2015 FINDINGS: The heart size is normal. Pacing wires are stable. The lungs are clear. Emphysematous changes are again noted. Previously seen right upper lobe airspace disease has cleared. Ankylosis of the thoracic spine is noted. IMPRESSION: 1. No acute cardiopulmonary disease. 2. Emphysema. Electronically Signed   By: San Morelle M.D.   On: 11/17/2015 14:29    Procedures Procedures (including critical care time)  Medications Ordered in ED Medications - No data to display   Initial Impression / Assessment and Plan / ED Course  I have reviewed the triage vital signs and the nursing notes.  Pertinent labs & imaging results that were available during my care of the patient were reviewed by me and considered in my medical decision making (see chart for details).  Clinical Course    82 yo F With a chief complaints of palpitations. He's been off and on for the past week. Patient has a sensation that she feels like she'll pass out. These happen spontaneously and there is no known trigger.  The patient had a troponin drawn in triage was negative and BMP and a CBC that were unremarkable. Chest x-ray is negative for acute process. We will interrogate the pacemaker. This is normal she'll likely be discharged home with follow-up with cardiology.  11:46 PM:  I have discussed the diagnosis/risks/treatment options with the patient and family and believe the pt to be eligible for discharge home to follow-up with PCP. We also discussed returning to the ED immediately if new or worsening sx occur. We discussed the sx which are most concerning (e.g., sudden worsening pain, fever,  inability to tolerate by mouth) that necessitate immediate return. Medications administered to the patient during their visit and any new prescriptions provided to the patient are listed below.  Medications given during this visit Medications - No data to display   The patient appears reasonably screen and/or stabilized for  discharge and I doubt any other medical condition or other [facility] requiring further screening, evaluation, or treatment in the ED at this time prior to discharge.    Final Clinical Impressions(s) / ED Diagnoses   Final diagnoses:  Near syncope    New Prescriptions Discharge Medication List as of 11/17/2015  6:11 PM       Deno Etienne, DO 11/17/15 2346

## 2015-11-17 NOTE — ED Triage Notes (Signed)
Per EMS: pt from home c/o 2 day episodes of palpitations; pt with pacemaker and hx of afib; IV 20g  LAC; pt denies any other sx

## 2015-11-17 NOTE — ED Notes (Signed)
Medtronic pacemaker interrogated °

## 2015-11-17 NOTE — ED Notes (Signed)
Kelly from [facility] called to report no abnormal findings from the reading of the pt's pacemaker.

## 2015-11-19 ENCOUNTER — Ambulatory Visit (INDEPENDENT_AMBULATORY_CARE_PROVIDER_SITE_OTHER): Payer: Medicare Other | Admitting: Internal Medicine

## 2015-11-19 ENCOUNTER — Encounter: Payer: Self-pay | Admitting: Internal Medicine

## 2015-11-19 VITALS — BP 100/60 | HR 68 | Ht 62.0 in | Wt 125.0 lb

## 2015-11-19 DIAGNOSIS — I10 Essential (primary) hypertension: Secondary | ICD-10-CM

## 2015-11-19 DIAGNOSIS — I495 Sick sinus syndrome: Secondary | ICD-10-CM

## 2015-11-19 DIAGNOSIS — I480 Paroxysmal atrial fibrillation: Secondary | ICD-10-CM

## 2015-11-19 LAB — CUP PACEART INCLINIC DEVICE CHECK
Battery Impedance: 207 Ohm
Battery Remaining Longevity: 120 mo
Brady Statistic AP VP Percent: 0 %
Date Time Interrogation Session: 20171026113530
Implantable Lead Implant Date: 20080221
Implantable Lead Location: 753860
Implantable Lead Model: 5076
Lead Channel Impedance Value: 530 Ohm
Lead Channel Pacing Threshold Amplitude: 0.5 V
Lead Channel Sensing Intrinsic Amplitude: 4 mV
Lead Channel Setting Pacing Amplitude: 2.5 V
MDC IDC LEAD IMPLANT DT: 20080221
MDC IDC LEAD LOCATION: 753859
MDC IDC MSMT BATTERY VOLTAGE: 2.8 V
MDC IDC MSMT LEADCHNL RA PACING THRESHOLD PULSEWIDTH: 0.4 ms
MDC IDC MSMT LEADCHNL RV IMPEDANCE VALUE: 435 Ohm
MDC IDC MSMT LEADCHNL RV PACING THRESHOLD AMPLITUDE: 1 V
MDC IDC MSMT LEADCHNL RV PACING THRESHOLD PULSEWIDTH: 0.4 ms
MDC IDC MSMT LEADCHNL RV SENSING INTR AMPL: 5.6 mV
MDC IDC SET LEADCHNL RA PACING AMPLITUDE: 2 V
MDC IDC SET LEADCHNL RV PACING PULSEWIDTH: 0.4 ms
MDC IDC SET LEADCHNL RV SENSING SENSITIVITY: 2.8 mV
MDC IDC STAT BRADY AP VS PERCENT: 95 %
MDC IDC STAT BRADY AS VP PERCENT: 0 %
MDC IDC STAT BRADY AS VS PERCENT: 5 %

## 2015-11-19 NOTE — Progress Notes (Signed)
PCP: Jerlyn Ly, MD Primary Cardiologist: Peter Martinique, MD  Jo [name] is a 82 y.o. female who presents today for electrophysiology followup.  She was just in the ED with presyncope.  She has not had syncope.  She states "I feel like my heart Cipollone stop".  Her son notes that she is very anxious.  She is very worried about her high blood sugars.   Today, she denies symptoms of chest pain, shortness of breath,  lower extremity edema,  or syncope.  The patient is otherwise without complaint today.   Past Medical History:  Diagnosis Date  . Anxiety   . Arthritis    "hands; between shoulders; back; feet"  . Chronic anticoagulation    -->Eliquis  . Chronic diastolic CHF (congestive heart failure) ([place])    a. 06/2011 Echo: EF 60-65%.  . Depression   . Essential hypertension   . High risk medication use    on Flecainide  . Kidney stone    passed in [place] 2014  . Moderate aortic insufficiency    a. 06/2011 Echo: EF 60-65%, mild LVH, no rwma, mod AI.  Marland Kitchen Osteoporosis   . Osteoporosis   . PAF (paroxysmal atrial fibrillation) (HCC)    a. chronic flecainide->reduced to 25 bid 08/2014.  Marland Kitchen Panic attacks    "since losing husband 1994"  . Skin cancer ?1960's   "between breasts"  . SSS (sick sinus syndrome) (HCC)    a. s/p pacemaker. b. s/p Medtronic gen change 07/2011; c.   . Type II diabetes mellitus ([place])    "controlled w/diet"   Past Surgical History:  Procedure Laterality Date  . APPENDECTOMY  1955  . BREAST CYST EXCISION  ~ 1950   right  . BREAST LUMPECTOMY WITH NEEDLE LOCALIZATION Bilateral 02/03/2014   Procedure: BILATERAL BREAST LUMPECTOMY WITH NEEDLE LOCALIZATION ON LEFT;  Surgeon: Excell Seltzer, MD;  Location: [facility];  Service: General;  Laterality: Bilateral;  . CARDIOVASCULAR STRESS TEST  12/09/2004  . CATARACT EXTRACTION W/ INTRAOCULAR LENS  IMPLANT, BILATERAL  1990's  . [place]  . DILATION AND CURETTAGE OF UTERUS  1953  . INSERT / REPLACE / REMOVE PACEMAKER   03/16/2006  . PERMANENT PACEMAKER GENERATOR CHANGE  08/03/2011   Procedure: PERMANENT PACEMAKER GENERATOR CHANGE;  Surgeon: Thompson Grayer, MD;  Location: [facility] CATH LAB;  Service: Cardiovascular;;  . SKIN CANCER EXCISION  ?1960's   "between my breasts"  . TONSILLECTOMY     "school age"    Current Outpatient Prescriptions  Medication Sig Dispense Refill  . acetaminophen (TYLENOL) 325 MG tablet Take 325 mg by mouth 2 (two) times daily as needed for moderate pain (back pain).     Marland Kitchen apixaban (ELIQUIS) 2.5 MG TABS tablet Take 1 tablet (2.5 mg total) by mouth 2 (two) times daily. 60 tablet 11  . azelastine (ASTELIN) 0.1 % nasal spray Place 1 spray into both nostrils daily as needed for rhinitis. Use in each nostril as directed    . carvedilol (COREG) 6.25 MG tablet TAKE 1 TABLET (6.25 MG TOTAL) BY MOUTH 2 (TWO) TIMES DAILY. 180 tablet 3  . CRESTOR 5 MG tablet Take 2.5 mg by mouth 3 (three) times a week. Monday, Wednesday and Friday  7  . diltiazem (CARDIZEM CD) 240 MG 24 hr capsule Take 240 mg by mouth daily.     Marland Kitchen docusate sodium (COLACE) 100 MG capsule Take 100 mg by mouth 2 (two) times daily.    Marland Kitchen LORazepam (ATIVAN) 0.5  MG tablet Take 0.5 mg by mouth at bedtime. Take 1/2 Tablet (0.25 mg tablet) in the morning at 9 AM and afternoon at 3 PM    . polyethylene glycol (MIRALAX / GLYCOLAX) packet Take 17 g by mouth 2 (two) times daily. Hold for loose stool. Mix in 4-8 oz liquid    . sertraline (ZOLOFT) 50 MG tablet Take 50 mg by mouth daily.  3  . UNABLE TO FIND Med Name: Medpass  Take 120 cc by mouth daily for protein malnutrition    . Vitamin D, Ergocalciferol, (DRISDOL) 50000 UNITS CAPS capsule Take 50,000 Units by mouth every Wednesday.     No current facility-administered medications for this visit.    ROS- all systems are reviewed and negative except as per HPI above  Physical Exam: Vitals:   11/19/15 1056  BP: 100/60  Pulse: 68  Weight: 125 lb (56.7 kg)  Height: 5\' 2"  (1.575 m)    GEN-  The patient is elderly appearing, alert and oriented x 3 today.   Head- normocephalic, atraumatic Eyes-  Sclera clear, conjunctiva pink Ears- hearing intact Oropharynx- clear Lungs- Clear to ausculation bilaterally, normal work of breathing Chest- pacemaker pocket is well healed Heart- Regular rate and rhythm, no murmurs, rubs or gallops, PMI not laterally displaced GI- soft, NT, ND, + BS Extremities- no clubbing, cyanosis, or edema  Pacemaker interrogation- reviewed in detail today,  See PACEART report ekg and labs from 11/17/15 are reviewed with patient toad and are normal  Assessment and Plan:  1. Sick sinus syndrome Normal pacemaker function See Pace Art report No changes today  2. Paroxysmal atrial fibrillation No afib by PPM interrogation Continue eliquis,  Recent labs reviewed  3. HTN BP today is low however son notes that when EMS came to her house the other day, her systolic blood pressure was 190s.  She takes her BP at home and states that it is "normal" though she is unable to given me any specific readings.  I have instructed that she follow-up with Dr Joylene Draft and take a BP/ BS log with her when she goes. If BPs are routinely low, could reduce/ stop diltiazem.  [facility] Follow-up with Dr Joylene Draft as scheduled Return to see EP NP in 1 year  Thompson Grayer MD, [facility] 11/19/2015 11:28 AM

## 2015-11-19 NOTE — Patient Instructions (Addendum)
Medication Instructions:  Your physician recommends that you continue on your current medications as directed. Please refer to the Current Medication list given to you today.   Labwork: None ordered   Testing/Procedures: None ordered   Follow-Up: Your physician wants you to follow-up in: 12 months with Dr Allred You will receive a reminder letter in the mail two months in advance. If you don't receive a letter, please call our office to schedule the follow-up appointment.  Remote monitoring is used to monitor your Pacemaker  from home. This monitoring reduces the number of office visits required to check your device to one time per year. It allows us to keep an eye on the functioning of your device to ensure it is working properly. You are scheduled for a device check from home on 02/18/16. You [place] send your transmission at any time that day. If you have a wireless device, the transmission will be sent automatically. After your physician reviews your transmission, you will receive a postcard with your next transmission date.     Any Other Special Instructions Will Be Listed Below (If Applicable).     If you need a refill on your cardiac medications before your next appointment, please call your pharmacy.   

## 2015-12-03 DIAGNOSIS — Z8679 Personal history of other diseases of the circulatory system: Secondary | ICD-10-CM | POA: Diagnosis not present

## 2015-12-03 DIAGNOSIS — I480 Paroxysmal atrial fibrillation: Secondary | ICD-10-CM | POA: Diagnosis not present

## 2015-12-03 DIAGNOSIS — F418 Other specified anxiety disorders: Secondary | ICD-10-CM | POA: Diagnosis not present

## 2015-12-03 DIAGNOSIS — Z23 Encounter for immunization: Secondary | ICD-10-CM | POA: Diagnosis not present

## 2015-12-03 DIAGNOSIS — Z6823 Body mass index (BMI) 23.0-23.9, adult: Secondary | ICD-10-CM | POA: Diagnosis not present

## 2015-12-03 DIAGNOSIS — B379 Candidiasis, unspecified: Secondary | ICD-10-CM | POA: Diagnosis not present

## 2015-12-03 DIAGNOSIS — E119 Type 2 diabetes mellitus without complications: Secondary | ICD-10-CM | POA: Diagnosis not present

## 2015-12-03 DIAGNOSIS — M810 Age-related osteoporosis without current pathological fracture: Secondary | ICD-10-CM | POA: Diagnosis not present

## 2015-12-28 ENCOUNTER — Other Ambulatory Visit (HOSPITAL_COMMUNITY): Payer: Self-pay | Admitting: Internal Medicine

## 2015-12-28 ENCOUNTER — Ambulatory Visit (HOSPITAL_COMMUNITY)
Admission: RE | Admit: 2015-12-28 | Discharge: 2015-12-28 | Disposition: A | Discharge status: Home / Self Care | Payer: Medicare Other | Source: Ambulatory Visit | Attending: Internal Medicine | Admitting: Internal Medicine

## 2015-12-28 ENCOUNTER — Encounter (HOSPITAL_COMMUNITY): Payer: Self-pay

## 2015-12-28 DIAGNOSIS — M810 Age-related osteoporosis without current pathological fracture: Secondary | ICD-10-CM | POA: Insufficient documentation

## 2015-12-28 MED ORDER — SODIUM CHLORIDE 0.9 % IV SOLN
Freq: Once | INTRAVENOUS | Status: AC
  Administered 2015-12-28: 12:00:00 via INTRAVENOUS

## 2015-12-28 MED ORDER — ZOLEDRONIC ACID 5 MG/100ML IV SOLN
5.0000 mg | Freq: Once | INTRAVENOUS | Status: AC
  Administered 2015-12-28: 5 mg via INTRAVENOUS
  Filled 2015-12-28: qty 100

## 2015-12-28 NOTE — Discharge Instructions (Signed)
Zoledronic Acid injection (Paget's Disease, Osteoporosis) °What is this medicine? °ZOLEDRONIC ACID (ZOE le dron ik AS id) lowers the amount of calcium loss from bone. It is used to treat Paget's disease and osteoporosis in women. °COMMON BRAND NAME(S): Reclast, Zometa °What should I tell my health care provider before I take this medicine? °They need to know if you have any of these conditions: °-aspirin-sensitive asthma °-cancer, especially if you are receiving medicines used to treat cancer °-dental disease or wear dentures °-infection °-kidney disease °-low levels of calcium in the blood °-past surgery on the parathyroid gland or intestines °-receiving corticosteroids like dexamethasone or prednisone °-an unusual or allergic reaction to zoledronic acid, other medicines, foods, dyes, or preservatives °-pregnant or trying to get pregnant °-breast-feeding °How should I use this medicine? °This medicine is for infusion into a vein. It is given by a health care professional in a hospital or clinic setting. °Talk to your pediatrician regarding the use of this medicine in children. This medicine is not approved for use in children. °What if I miss a dose? °It is important not to miss your dose. Call your doctor or health care professional if you are unable to keep an appointment. °What [place] interact with this medicine? °-certain antibiotics given by injection °-NSAIDs, medicines for pain and inflammation, like ibuprofen or naproxen °-some diuretics like bumetanide, furosemide °-teriparatide °What should I watch for while using this medicine? °Visit your doctor or health care professional for regular checkups. It [place] be some time before you see the benefit from this medicine. Do not stop taking your medicine unless your doctor tells you to. Your doctor [place] order blood tests or other tests to see how you are doing. °Women should inform their doctor if they wish to become pregnant or think they might be pregnant. There is a  potential for serious side effects to an unborn child. Talk to your health care professional or pharmacist for more information. °You should make sure that you get enough calcium and vitamin D while you are taking this medicine. Discuss the foods you eat and the vitamins you take with your health care professional. °Some people who take this medicine have severe bone, joint, and/or muscle pain. This medicine [place] also increase your risk for jaw problems or a broken thigh bone. Tell your doctor right away if you have severe pain in your jaw, bones, joints, or muscles. Tell your doctor if you have any pain that does not go away or that gets worse. °Tell your dentist and dental surgeon that you are taking this medicine. You should not have major dental surgery while on this medicine. See your dentist to have a dental exam and fix any dental problems before starting this medicine. Take good care of your teeth while on this medicine. Make sure you see your dentist for regular follow-up appointments. °What side effects Dag[place] I notice from receiving this medicine? °Side effects that you should report to your doctor or health care professional as soon as possible: °-allergic reactions like skin rash, itching or hives, swelling of the face, lips, or tongue °-anxiety, confusion, or depression °-breathing problems °-changes in vision °-eye pain °-feeling faint or lightheaded, falls °-jaw pain, especially after dental work °-mouth sores °-muscle cramps, stiffness, or weakness °-redness, blistering, peeling or loosening of the skin, including inside the mouth °-trouble passing urine or change in the amount of urine °Side effects that usually do not require medical attention (report to your doctor or health care professional if   they continue or are bothersome): °-bone, joint, or muscle pain °-constipation °-diarrhea °-fever °-hair loss °-irritation at site where injected °-loss of appetite °-nausea, vomiting °-stomach  upset °-trouble sleeping °-trouble swallowing °-weak or tired °Where should I keep my medicine? °This drug is given in a hospital or clinic and will not be stored at home. °© 2017 [employer] (2013-06-08 14:19:57) ° °

## 2016-02-18 ENCOUNTER — Telehealth: Payer: Self-pay | Admitting: Cardiology

## 2016-02-18 ENCOUNTER — Ambulatory Visit (INDEPENDENT_AMBULATORY_CARE_PROVIDER_SITE_OTHER): Payer: Medicare Other | Admitting: *Deleted

## 2016-02-18 DIAGNOSIS — I495 Sick sinus syndrome: Secondary | ICD-10-CM

## 2016-02-18 NOTE — Telephone Encounter (Signed)
Confirmed remote transmission w/ pt daughter.   

## 2016-02-19 ENCOUNTER — Encounter: Payer: Self-pay | Admitting: Cardiology

## 2016-02-19 NOTE — Progress Notes (Signed)
Remote pacemaker transmission.   

## 2016-02-20 LAB — CUP PACEART REMOTE DEVICE CHECK
Battery Impedance: 232 Ohm
Brady Statistic AP VP Percent: 0 %
Brady Statistic AP VS Percent: 96 %
Brady Statistic AS VP Percent: 0 %
Brady Statistic AS VS Percent: 4 %
Date Time Interrogation Session: 20180125173306
Implantable Lead Implant Date: 20080221
Implantable Lead Location: 753859
Implantable Lead Location: 753860
Implantable Lead Model: 4076
Implantable Lead Model: 5076
Lead Channel Impedance Value: 392 Ohm
Lead Channel Impedance Value: 515 Ohm
Lead Channel Pacing Threshold Amplitude: 0.375 V
Lead Channel Pacing Threshold Amplitude: 1 V
Lead Channel Pacing Threshold Pulse Width: 0.4 ms
Lead Channel Setting Pacing Amplitude: 2.5 V
MDC IDC LEAD IMPLANT DT: 20080221
MDC IDC MSMT BATTERY REMAINING LONGEVITY: 116 mo
MDC IDC MSMT BATTERY VOLTAGE: 2.8 V
MDC IDC MSMT LEADCHNL RV PACING THRESHOLD PULSEWIDTH: 0.4 ms
MDC IDC PG IMPLANT DT: 20130710
MDC IDC SET LEADCHNL RA PACING AMPLITUDE: 2 V
MDC IDC SET LEADCHNL RV PACING PULSEWIDTH: 0.4 ms
MDC IDC SET LEADCHNL RV SENSING SENSITIVITY: 2 mV

## 2016-03-09 DIAGNOSIS — Z6822 Body mass index (BMI) 22.0-22.9, adult: Secondary | ICD-10-CM | POA: Diagnosis not present

## 2016-03-09 DIAGNOSIS — I10 Essential (primary) hypertension: Secondary | ICD-10-CM | POA: Diagnosis not present

## 2016-03-09 DIAGNOSIS — I480 Paroxysmal atrial fibrillation: Secondary | ICD-10-CM | POA: Diagnosis not present

## 2016-03-09 DIAGNOSIS — E119 Type 2 diabetes mellitus without complications: Secondary | ICD-10-CM | POA: Diagnosis not present

## 2016-03-09 DIAGNOSIS — R0609 Other forms of dyspnea: Secondary | ICD-10-CM | POA: Diagnosis not present

## 2016-03-09 DIAGNOSIS — F418 Other specified anxiety disorders: Secondary | ICD-10-CM | POA: Diagnosis not present

## 2016-03-13 NOTE — Progress Notes (Signed)
Jo [name] Date of Birth: May 14, 1917 Medical Record [id number]  History of Present Illness: Jo [name] is seen back today for follow up Afib. She has a history of AI, depression, DM, HTN and PAF with a pacemaker in place. Had been on chronic Flecainide and Eliquis. Flecainide discontinued in April 2017 due to visual disturbance and weakness. Last generator change back in 2013. Pacer check in January 2018 showed no Afib. She was admitted in early June 2017 with CAP. She was in Rehab for a month afterwards. Echo at that time showed normal LV function and only mild AI.  On follow up she is doing well.   Denies any chest pain or SOB. No palpitations. She is walking with a walker and is seen with her daughter today.  Current Outpatient Prescriptions  Medication Sig Dispense Refill  . acetaminophen (TYLENOL) 325 MG tablet Take 325 mg by mouth 2 (two) times daily as needed for moderate pain (back pain).     Marland Kitchen apixaban (ELIQUIS) 2.5 MG TABS tablet Take 1 tablet (2.5 mg total) by mouth 2 (two) times daily. 60 tablet 11  . azelastine (ASTELIN) 0.1 % nasal spray Place 1 spray into both nostrils daily as needed for rhinitis. Use in each nostril as directed    . carvedilol (COREG) 6.25 MG tablet TAKE 1 TABLET (6.25 MG TOTAL) BY MOUTH 2 (TWO) TIMES DAILY. 180 tablet 3  . CRESTOR 5 MG tablet Take 2.5 mg by mouth 3 (three) times a week. Monday, Wednesday and Friday  7  . diltiazem (CARDIZEM CD) 240 MG 24 hr capsule Take 240 mg by mouth daily.     Marland Kitchen docusate sodium (COLACE) 100 MG capsule Take 100 mg by mouth 2 (two) times daily.    Marland Kitchen LORazepam (ATIVAN) 0.5 MG tablet Take 0.5 mg by mouth at bedtime. Take 1/2 Tablet (0.25 mg tablet) in the morning at 9 AM and afternoon at 3 PM    . polyethylene glycol (MIRALAX / GLYCOLAX) packet Take 17 g by mouth 2 (two) times daily. Hold for loose stool. Mix in 4-8 oz liquid    . sertraline (ZOLOFT) 50 MG tablet Take 50 mg by mouth daily.  3  . UNABLE TO FIND Med Name: Medpass   Take 120 cc by mouth daily for protein malnutrition    . Vitamin D, Ergocalciferol, (DRISDOL) 50000 UNITS CAPS capsule Take 50,000 Units by mouth every Wednesday.     No current facility-administered medications for this visit.     Allergies  Allergen Reactions  . Sulfonamide Derivatives Rash    "it was all over my legs"  . Citalopram Hydrobromide Other (See Comments)    Pt does not remember reaction    Past Medical History:  Diagnosis Date  . Anxiety   . Arthritis    "hands; between shoulders; back; feet"  . Chronic anticoagulation    -->Eliquis  . Chronic diastolic CHF (congestive heart failure) ([place])    a. 06/2011 Echo: EF 60-65%.  . Depression   . Essential hypertension   . High risk medication use    on Flecainide  . Kidney stone    passed in [place] 2014  . Moderate aortic insufficiency    a. 06/2011 Echo: EF 60-65%, mild LVH, no rwma, mod AI.  Marland Kitchen Osteoporosis   . Osteoporosis   . PAF (paroxysmal atrial fibrillation) (H[facility])    a. chronic flecainide->reduced to 25 bid 08/2014.  Marland Kitchen Panic attacks    "since losing husband 1994"  .  Skin cancer ?1960's   "between breasts"  . SSS (sick sinus syndrome) ([facility])    a. s/p pacemaker. b. s/p Medtronic gen change 07/2011; c.   . Type II diabetes mellitus ([place])    "controlled w/diet"    Past Surgical History:  Procedure Laterality Date  . APPENDECTOMY  1955  . BREAST CYST EXCISION  ~ 1950   right  . BREAST LUMPECTOMY WITH NEEDLE LOCALIZATION Bilateral 02/03/2014   Procedure: BILATERAL BREAST LUMPECTOMY WITH NEEDLE LOCALIZATION ON LEFT;  Surgeon: Excell Seltzer, MD;  Location: [facility];  Service: General;  Laterality: Bilateral;  . CARDIOVASCULAR STRESS TEST  12/09/2004  . CATARACT EXTRACTION W/ INTRAOCULAR LENS  IMPLANT, BILATERAL  1990's  . [place]  . DILATION AND CURETTAGE OF UTERUS  1953  . INSERT / REPLACE / REMOVE PACEMAKER  03/16/2006  . PERMANENT PACEMAKER GENERATOR CHANGE  08/03/2011   Procedure: PERMANENT  PACEMAKER GENERATOR CHANGE;  Surgeon: Thompson Grayer, MD;  Location: [facility] CATH LAB;  Service: Cardiovascular;;  . SKIN CANCER EXCISION  ?1960's   "between my breasts"  . TONSILLECTOMY     "school age"    History  Smoking Status  . Never Smoker  Smokeless Tobacco  . Never Used    History  Alcohol Use No    Family History  Problem Relation Age of Onset  . Emphysema Father     Review of Systems: The review of systems is per the HPI.  All other systems were reviewed and are negative.  Physical Exam: BP 130/64   Pulse 80   Ht 5\' 2"  (1.575 m)   Wt 126 lb 12.8 oz (57.5 kg)   BMI 23.19 kg/m  Patient is very pleasant and in no acute distress. She is hard of hearing. Skin is warm and dry. Color is normal.  HEENT is unremarkable. Normocephalic/atraumatic. PERRL. Sclera are nonicteric. Neck is supple. No masses. No JVD. Lungs are clear. Cardiac exam shows a regular rate and rhythm. Abdomen is soft. Extremities are without edema. Gait and ROM are intact. No gross neurologic deficits noted.  LABORATORY DATA:  Lab Results  Component Value Date   WBC 10.1 11/17/2015   HGB 14.1 11/17/2015   HCT 41.3 11/17/2015   PLT 198 11/17/2015   GLUCOSE 119 (H) 11/17/2015   ALT 28 07/06/2015   AST 16 07/06/2015   NA 142 11/17/2015   K 4.6 11/17/2015   CL 108 11/17/2015   CREATININE 0.70 11/17/2015   BUN 18 11/17/2015   CO2 28 11/17/2015   INR 1.41 07/06/2012   HGBA1C 7.6 (H) 06/28/2015   Labs dated 05/27/15: cholesterol 160, triglycerides 57, HDL 44, LDL 105. A1c 03/09/16: 6.9%.   Echo: 07/01/15:Study Conclusions  - Left ventricle: The cavity size was normal. There was mild focal   basal hypertrophy of the septum. Systolic function was normal.   The estimated ejection fraction was in the range of 60% to 65%.   Wall motion was normal; there were no regional wall motion   abnormalities. There was an increased relative contribution of   atrial contraction to ventricular filling. Doppler  parameters are   consistent with abnormal left ventricular relaxation (grade 1   diastolic dysfunction). Doppler parameters are consistent with   high ventricular filling pressure. - Aortic valve: Mildly to moderately calcified annulus. Trileaflet;   mildly thickened, mildly calcified leaflets. There was mild   regurgitation. - Mitral valve: Calcified annulus. There was trivial regurgitation. - Right ventricle: Pacer wire or catheter noted  in right ventricle.  Assessment / Plan:  1. PAF - has PPM in place. Remains on  Eliquis and beta blocker. She is maintaining sinus rhythm well. Recent pacer check showed no Afib. Flecainide discontinued due to side effects.  2. HTN - BP under excellent control. Continue current therapy  3. Chronic Diastolic HF - well compensated today. No change in her current therapy.   4. CAP- patient has recovered well.   I will follow up in 6 months.

## 2016-03-15 ENCOUNTER — Encounter: Payer: Self-pay | Admitting: Cardiology

## 2016-03-15 ENCOUNTER — Ambulatory Visit (INDEPENDENT_AMBULATORY_CARE_PROVIDER_SITE_OTHER): Payer: Medicare Other | Admitting: Cardiology

## 2016-03-15 VITALS — BP 130/64 | HR 80 | Ht 62.0 in | Wt 126.8 lb

## 2016-03-15 DIAGNOSIS — I10 Essential (primary) hypertension: Secondary | ICD-10-CM

## 2016-03-15 DIAGNOSIS — I5032 Chronic diastolic (congestive) heart failure: Secondary | ICD-10-CM

## 2016-03-15 DIAGNOSIS — I480 Paroxysmal atrial fibrillation: Secondary | ICD-10-CM

## 2016-03-15 DIAGNOSIS — I495 Sick sinus syndrome: Secondary | ICD-10-CM | POA: Diagnosis not present

## 2016-03-15 NOTE — Patient Instructions (Signed)
Continue your current therapy  I will see you in 6 months.   

## 2016-04-06 DIAGNOSIS — H9193 Unspecified hearing loss, bilateral: Secondary | ICD-10-CM | POA: Diagnosis not present

## 2016-04-06 DIAGNOSIS — H6121 Impacted cerumen, right ear: Secondary | ICD-10-CM | POA: Diagnosis not present

## 2016-04-06 DIAGNOSIS — Z6822 Body mass index (BMI) 22.0-22.9, adult: Secondary | ICD-10-CM | POA: Diagnosis not present

## 2016-05-06 DIAGNOSIS — C50919 Malignant neoplasm of unspecified site of unspecified female breast: Secondary | ICD-10-CM | POA: Diagnosis not present

## 2016-05-19 ENCOUNTER — Ambulatory Visit (INDEPENDENT_AMBULATORY_CARE_PROVIDER_SITE_OTHER): Payer: Medicare Other | Admitting: *Deleted

## 2016-05-19 ENCOUNTER — Telehealth: Payer: Self-pay | Admitting: Cardiology

## 2016-05-19 DIAGNOSIS — I495 Sick sinus syndrome: Secondary | ICD-10-CM

## 2016-05-19 NOTE — Telephone Encounter (Signed)
LMOVM reminding pt to send remote transmission.   

## 2016-05-19 NOTE — Progress Notes (Signed)
Remote pacemaker transmission.   

## 2016-05-20 ENCOUNTER — Encounter: Payer: Self-pay | Admitting: Cardiology

## 2016-05-20 LAB — CUP PACEART REMOTE DEVICE CHECK
Battery Remaining Longevity: 116 mo
Brady Statistic AS VP Percent: 0 %
Brady Statistic AS VS Percent: 3 %
Date Time Interrogation Session: 20180426154131
Implantable Lead Implant Date: 20080221
Implantable Lead Location: 753859
Implantable Pulse Generator Implant Date: 20130710
Lead Channel Pacing Threshold Amplitude: 0.5 V
Lead Channel Pacing Threshold Amplitude: 0.875 V
Lead Channel Pacing Threshold Pulse Width: 0.4 ms
Lead Channel Setting Pacing Amplitude: 2 V
MDC IDC LEAD IMPLANT DT: 20080221
MDC IDC LEAD LOCATION: 753860
MDC IDC MSMT BATTERY IMPEDANCE: 232 Ohm
MDC IDC MSMT BATTERY VOLTAGE: 2.8 V
MDC IDC MSMT LEADCHNL RA IMPEDANCE VALUE: 508 Ohm
MDC IDC MSMT LEADCHNL RA PACING THRESHOLD PULSEWIDTH: 0.4 ms
MDC IDC MSMT LEADCHNL RV IMPEDANCE VALUE: 408 Ohm
MDC IDC SET LEADCHNL RV PACING AMPLITUDE: 2.5 V
MDC IDC SET LEADCHNL RV PACING PULSEWIDTH: 0.4 ms
MDC IDC SET LEADCHNL RV SENSING SENSITIVITY: 2 mV
MDC IDC STAT BRADY AP VP PERCENT: 0 %
MDC IDC STAT BRADY AP VS PERCENT: 97 %

## 2016-06-21 DIAGNOSIS — E559 Vitamin D deficiency, unspecified: Secondary | ICD-10-CM | POA: Diagnosis not present

## 2016-06-21 DIAGNOSIS — I10 Essential (primary) hypertension: Secondary | ICD-10-CM | POA: Diagnosis not present

## 2016-06-21 DIAGNOSIS — E119 Type 2 diabetes mellitus without complications: Secondary | ICD-10-CM | POA: Diagnosis not present

## 2016-06-21 DIAGNOSIS — E784 Other hyperlipidemia: Secondary | ICD-10-CM | POA: Diagnosis not present

## 2016-06-27 DIAGNOSIS — M810 Age-related osteoporosis without current pathological fracture: Secondary | ICD-10-CM | POA: Diagnosis not present

## 2016-06-27 DIAGNOSIS — Z0000 Encounter for general adult medical examination without abnormal findings: Secondary | ICD-10-CM | POA: Diagnosis not present

## 2016-06-27 DIAGNOSIS — C50919 Malignant neoplasm of unspecified site of unspecified female breast: Secondary | ICD-10-CM | POA: Diagnosis not present

## 2016-06-27 DIAGNOSIS — E119 Type 2 diabetes mellitus without complications: Secondary | ICD-10-CM | POA: Diagnosis not present

## 2016-06-27 DIAGNOSIS — Z6822 Body mass index (BMI) 22.0-22.9, adult: Secondary | ICD-10-CM | POA: Diagnosis not present

## 2016-06-27 DIAGNOSIS — Z8679 Personal history of other diseases of the circulatory system: Secondary | ICD-10-CM | POA: Diagnosis not present

## 2016-06-27 DIAGNOSIS — Z950 Presence of cardiac pacemaker: Secondary | ICD-10-CM | POA: Diagnosis not present

## 2016-06-27 DIAGNOSIS — F410 Panic disorder [episodic paroxysmal anxiety] without agoraphobia: Secondary | ICD-10-CM | POA: Diagnosis not present

## 2016-06-27 DIAGNOSIS — Z1231 Encounter for screening mammogram for malignant neoplasm of breast: Secondary | ICD-10-CM | POA: Diagnosis not present

## 2016-06-27 DIAGNOSIS — I480 Paroxysmal atrial fibrillation: Secondary | ICD-10-CM | POA: Diagnosis not present

## 2016-06-27 DIAGNOSIS — H9193 Unspecified hearing loss, bilateral: Secondary | ICD-10-CM | POA: Diagnosis not present

## 2016-06-27 DIAGNOSIS — F418 Other specified anxiety disorders: Secondary | ICD-10-CM | POA: Diagnosis not present

## 2016-07-18 ENCOUNTER — Encounter: Payer: Medicare Other | Admitting: Internal Medicine

## 2016-08-18 ENCOUNTER — Telehealth: Payer: Self-pay | Admitting: Cardiology

## 2016-08-18 ENCOUNTER — Ambulatory Visit (INDEPENDENT_AMBULATORY_CARE_PROVIDER_SITE_OTHER): Payer: Medicare Other | Admitting: *Deleted

## 2016-08-18 DIAGNOSIS — I495 Sick sinus syndrome: Secondary | ICD-10-CM

## 2016-08-18 NOTE — Telephone Encounter (Signed)
Confirmed remote transmission w/ pt daughter.   

## 2016-08-19 ENCOUNTER — Encounter: Payer: Self-pay | Admitting: Cardiology

## 2016-08-19 NOTE — Progress Notes (Signed)
Remote pacemaker transmission.   

## 2016-08-29 ENCOUNTER — Inpatient Hospital Stay (HOSPITAL_COMMUNITY)
Admission: EM | Admit: 2016-08-29 | Discharge: 2016-08-31 | DRG: 690 | Disposition: A | Discharge status: Home Health | Payer: Medicare Other | Attending: Internal Medicine | Admitting: Internal Medicine

## 2016-08-29 ENCOUNTER — Encounter (HOSPITAL_COMMUNITY): Payer: Self-pay | Admitting: Family Medicine

## 2016-08-29 ENCOUNTER — Emergency Department (HOSPITAL_COMMUNITY): Payer: Medicare Other

## 2016-08-29 DIAGNOSIS — N390 Urinary tract infection, site not specified: Principal | ICD-10-CM | POA: Diagnosis present

## 2016-08-29 DIAGNOSIS — I10 Essential (primary) hypertension: Secondary | ICD-10-CM | POA: Diagnosis present

## 2016-08-29 DIAGNOSIS — Z85828 Personal history of other malignant neoplasm of skin: Secondary | ICD-10-CM

## 2016-08-29 DIAGNOSIS — I495 Sick sinus syndrome: Secondary | ICD-10-CM | POA: Diagnosis present

## 2016-08-29 DIAGNOSIS — R05 Cough: Secondary | ICD-10-CM | POA: Diagnosis not present

## 2016-08-29 DIAGNOSIS — I480 Paroxysmal atrial fibrillation: Secondary | ICD-10-CM | POA: Diagnosis present

## 2016-08-29 DIAGNOSIS — R531 Weakness: Secondary | ICD-10-CM | POA: Diagnosis present

## 2016-08-29 DIAGNOSIS — Z66 Do not resuscitate: Secondary | ICD-10-CM | POA: Diagnosis present

## 2016-08-29 DIAGNOSIS — Z950 Presence of cardiac pacemaker: Secondary | ICD-10-CM

## 2016-08-29 DIAGNOSIS — Z6821 Body mass index (BMI) 21.0-21.9, adult: Secondary | ICD-10-CM

## 2016-08-29 DIAGNOSIS — Z7901 Long term (current) use of anticoagulants: Secondary | ICD-10-CM

## 2016-08-29 DIAGNOSIS — Z881 Allergy status to other antibiotic agents status: Secondary | ICD-10-CM

## 2016-08-29 DIAGNOSIS — R0602 Shortness of breath: Secondary | ICD-10-CM | POA: Diagnosis not present

## 2016-08-29 DIAGNOSIS — I5032 Chronic diastolic (congestive) heart failure: Secondary | ICD-10-CM | POA: Diagnosis present

## 2016-08-29 DIAGNOSIS — M1991 Primary osteoarthritis, unspecified site: Secondary | ICD-10-CM | POA: Diagnosis present

## 2016-08-29 DIAGNOSIS — F410 Panic disorder [episodic paroxysmal anxiety] without agoraphobia: Secondary | ICD-10-CM | POA: Diagnosis present

## 2016-08-29 DIAGNOSIS — Z79899 Other long term (current) drug therapy: Secondary | ICD-10-CM

## 2016-08-29 DIAGNOSIS — B9620 Unspecified Escherichia coli [E. coli] as the cause of diseases classified elsewhere: Secondary | ICD-10-CM | POA: Diagnosis present

## 2016-08-29 DIAGNOSIS — M810 Age-related osteoporosis without current pathological fracture: Secondary | ICD-10-CM | POA: Diagnosis present

## 2016-08-29 DIAGNOSIS — I110 Hypertensive heart disease with heart failure: Secondary | ICD-10-CM | POA: Diagnosis not present

## 2016-08-29 DIAGNOSIS — F329 Major depressive disorder, single episode, unspecified: Secondary | ICD-10-CM | POA: Diagnosis present

## 2016-08-29 DIAGNOSIS — Z888 Allergy status to other drugs, medicaments and biological substances status: Secondary | ICD-10-CM

## 2016-08-29 DIAGNOSIS — I700 Atherosclerosis of aorta: Secondary | ICD-10-CM | POA: Diagnosis present

## 2016-08-29 DIAGNOSIS — E119 Type 2 diabetes mellitus without complications: Secondary | ICD-10-CM | POA: Diagnosis present

## 2016-08-29 DIAGNOSIS — R404 Transient alteration of awareness: Secondary | ICD-10-CM | POA: Diagnosis not present

## 2016-08-29 DIAGNOSIS — R9431 Abnormal electrocardiogram [ECG] [EKG]: Secondary | ICD-10-CM | POA: Diagnosis not present

## 2016-08-29 DIAGNOSIS — F419 Anxiety disorder, unspecified: Secondary | ICD-10-CM | POA: Diagnosis present

## 2016-08-29 DIAGNOSIS — E785 Hyperlipidemia, unspecified: Secondary | ICD-10-CM | POA: Diagnosis present

## 2016-08-29 LAB — URINALYSIS, ROUTINE W REFLEX MICROSCOPIC
BILIRUBIN URINE: NEGATIVE
KETONES UR: NEGATIVE mg/dL
NITRITE: NEGATIVE
PH: 8 (ref 5.0–8.0)
Protein, ur: NEGATIVE mg/dL
Specific Gravity, Urine: 1.003 — ABNORMAL LOW (ref 1.005–1.030)

## 2016-08-29 LAB — CBC WITH DIFFERENTIAL/PLATELET
Basophils Absolute: 0 10*3/uL (ref 0.0–0.1)
Basophils Relative: 0 %
EOS ABS: 0.1 10*3/uL (ref 0.0–0.7)
Eosinophils Relative: 1 %
HCT: 38 % (ref 36.0–46.0)
HEMOGLOBIN: 13 g/dL (ref 12.0–15.0)
LYMPHS ABS: 2.1 10*3/uL (ref 0.7–4.0)
Lymphocytes Relative: 21 %
MCH: 31.5 pg (ref 26.0–34.0)
MCHC: 34.2 g/dL (ref 30.0–36.0)
MCV: 92 fL (ref 78.0–100.0)
MONOS PCT: 5 %
Monocytes Absolute: 0.5 10*3/uL (ref 0.1–1.0)
NEUTROS PCT: 73 %
Neutro Abs: 7.5 10*3/uL (ref 1.7–7.7)
Platelets: 245 10*3/uL (ref 150–400)
RBC: 4.13 MIL/uL (ref 3.87–5.11)
RDW: 13.2 % (ref 11.5–15.5)
WBC: 10.3 10*3/uL (ref 4.0–10.5)

## 2016-08-29 LAB — COMPREHENSIVE METABOLIC PANEL
ALK PHOS: 69 U/L (ref 38–126)
ALT: 14 U/L (ref 14–54)
ANION GAP: 9 (ref 5–15)
AST: 18 U/L (ref 15–41)
Albumin: 3.9 g/dL (ref 3.5–5.0)
BILIRUBIN TOTAL: 0.5 mg/dL (ref 0.3–1.2)
BUN: 15 mg/dL (ref 6–20)
CALCIUM: 9.5 mg/dL (ref 8.9–10.3)
CO2: 28 mmol/L (ref 22–32)
Chloride: 104 mmol/L (ref 101–111)
Creatinine, Ser: 0.64 mg/dL (ref 0.44–1.00)
Glucose, Bld: 220 mg/dL — ABNORMAL HIGH (ref 65–99)
Potassium: 4.1 mmol/L (ref 3.5–5.1)
SODIUM: 141 mmol/L (ref 135–145)
TOTAL PROTEIN: 7.4 g/dL (ref 6.5–8.1)

## 2016-08-29 LAB — I-STAT TROPONIN, ED: Troponin i, poc: 0.01 ng/mL (ref 0.00–0.08)

## 2016-08-29 LAB — TROPONIN I: Troponin I: <0.03 ng/mL (ref <0.03)

## 2016-08-29 LAB — CBG MONITORING, ED: Glucose-Capillary: 204 mg/dL — ABNORMAL HIGH (ref 65–99)

## 2016-08-29 MED ORDER — ACETAMINOPHEN 325 MG PO TABS: 650.0000 mg | ORAL_TABLET | Freq: Four times a day (QID) | ORAL | Status: DC | PRN

## 2016-08-29 MED ORDER — LORAZEPAM 0.5 MG PO TABS
0.5000 mg | ORAL_TABLET | Freq: Every day | ORAL | Status: DC
  Administered 2016-08-29 – 2016-08-30 (×2): 0.5 mg via ORAL
  Filled 2016-08-29 (×2): qty 1

## 2016-08-29 MED ORDER — LORAZEPAM 0.5 MG PO TABS: 0.2500 mg | ORAL_TABLET | Freq: Three times a day (TID) | ORAL | Status: DC

## 2016-08-29 MED ORDER — LORAZEPAM 0.5 MG PO TABS
0.2500 mg | ORAL_TABLET | ORAL | Status: DC
  Administered 2016-08-30 – 2016-08-31 (×3): 0.25 mg via ORAL
  Filled 2016-08-29 (×3): qty 1

## 2016-08-29 MED ORDER — CARVEDILOL 6.25 MG PO TABS
6.2500 mg | ORAL_TABLET | Freq: Two times a day (BID) | ORAL | Status: DC
Start: 2016-08-30 — End: 2016-08-31
  Administered 2016-08-30 – 2016-08-31 (×2): 6.25 mg via ORAL
  Filled 2016-08-29 (×2): qty 1

## 2016-08-29 MED ORDER — ENSURE ENLIVE PO LIQD
237.0000 mL | Freq: Two times a day (BID) | ORAL | Status: DC
  Administered 2016-08-30 – 2016-08-31 (×3): 237 mL via ORAL

## 2016-08-29 MED ORDER — APIXABAN 2.5 MG PO TABS
2.5000 mg | ORAL_TABLET | Freq: Two times a day (BID) | ORAL | Status: DC
  Administered 2016-08-29 – 2016-08-31 (×4): 2.5 mg via ORAL
  Filled 2016-08-29 (×4): qty 1

## 2016-08-29 MED ORDER — ACETAMINOPHEN 650 MG RE SUPP: 650.0000 mg | Freq: Four times a day (QID) | RECTAL | Status: DC | PRN

## 2016-08-29 MED ORDER — INSULIN ASPART 100 UNIT/ML ~~LOC~~ SOLN
0.0000 [IU] | Freq: Three times a day (TID) | SUBCUTANEOUS | Status: DC
  Administered 2016-08-30 (×2): 2 [IU] via SUBCUTANEOUS
  Administered 2016-08-30 – 2016-08-31 (×2): 1 [IU] via SUBCUTANEOUS
  Administered 2016-08-31: 3 [IU] via SUBCUTANEOUS

## 2016-08-29 MED ORDER — ONDANSETRON HCL 4 MG PO TABS: 4.0000 mg | ORAL_TABLET | Freq: Four times a day (QID) | ORAL | Status: DC | PRN

## 2016-08-29 MED ORDER — SERTRALINE HCL 50 MG PO TABS
50.0000 mg | ORAL_TABLET | Freq: Every day | ORAL | Status: DC
  Administered 2016-08-30 – 2016-08-31 (×2): 50 mg via ORAL
  Filled 2016-08-29 (×2): qty 1

## 2016-08-29 MED ORDER — ROSUVASTATIN CALCIUM 5 MG PO TABS: 2.5000 mg | ORAL_TABLET | ORAL | Status: DC

## 2016-08-29 MED ORDER — DILTIAZEM HCL ER COATED BEADS 240 MG PO CP24
240.0000 mg | ORAL_CAPSULE | Freq: Every day | ORAL | Status: DC
  Administered 2016-08-30 – 2016-08-31 (×2): 240 mg via ORAL
  Filled 2016-08-29 (×2): qty 1

## 2016-08-29 MED ORDER — DEXTROSE 5 % IV SOLN
1.0000 g | INTRAVENOUS | Status: DC
  Administered 2016-08-30: 1 g via INTRAVENOUS
  Filled 2016-08-29 (×2): qty 10

## 2016-08-29 MED ORDER — VITAMIN D (ERGOCALCIFEROL) 1.25 MG (50000 UNIT) PO CAPS
50000.0000 [IU] | ORAL_CAPSULE | ORAL | Status: DC
  Administered 2016-08-31: 50000 [IU] via ORAL
  Filled 2016-08-29: qty 1

## 2016-08-29 MED ORDER — ONDANSETRON HCL 4 MG/2ML IJ SOLN: 4.0000 mg | Freq: Four times a day (QID) | INTRAMUSCULAR | Status: DC | PRN

## 2016-08-29 MED ORDER — AZELASTINE HCL 0.1 % NA SOLN
1.0000 | Freq: Every day | NASAL | Status: DC | PRN
  Filled 2016-08-29: qty 30

## 2016-08-29 MED ORDER — POLYETHYLENE GLYCOL 3350 17 G PO PACK: 17.0000 g | PACK | Freq: Every day | ORAL | Status: DC | PRN

## 2016-08-29 MED ORDER — DEXTROSE 5 % IV SOLN
1.0000 g | Freq: Once | INTRAVENOUS | Status: AC
  Administered 2016-08-29: 1 g via INTRAVENOUS
  Filled 2016-08-29: qty 10

## 2016-08-29 NOTE — ED Notes (Signed)
Assisted patient to the restroom and back to stretcher with assistance of walker. Pt tolerated it well but reports she is very weak.

## 2016-08-29 NOTE — ED Triage Notes (Signed)
Per EMS, pt from home.  Pt c/o generalized weakness x 5 days.  Pt had MD appt today but decided to come here for her symptoms.  No n/v/d.  Pt c/o some shortness of breath at 92% ra.  Pt placed on 3l per [place], 96%.  Vitals: 190/77, hr 72, 96% 3l per [place], resp 22

## 2016-08-29 NOTE — ED Notes (Signed)
Call report to [facility] at [phone] in 15 minutes.

## 2016-08-29 NOTE — ED Provider Notes (Signed)
[facility] DEPT Provider Note   CSN: [id number] Arrival date & time: 08/29/16  1149     History   Chief Complaint Chief Complaint  Patient presents with  . Weakness    HPI Jo [name] is a 83 y.o. female.  Patient states that she's been very weak for the last couple days.   The history is provided by the patient.  Weakness  Primary symptoms include no focal weakness. This is a new problem. The current episode started more than 2 days ago. The problem has not changed since onset.There was no focality noted. There has been no fever. Pertinent negatives include no shortness of breath, no chest pain and no headaches. There were no medications administered prior to arrival. Associated medical issues do not include trauma.    Past Medical History:  Diagnosis Date  . Anxiety   . Arthritis    "hands; between shoulders; back; feet"  . Chronic anticoagulation    -->Eliquis  . Chronic diastolic CHF (congestive heart failure) ([place])    a. 06/2011 Echo: EF 60-65%.  . Depression   . Essential hypertension   . High risk medication use    on Flecainide  . Kidney stone    passed in [place] 2014  . Moderate aortic insufficiency    a. 06/2011 Echo: EF 60-65%, mild LVH, no rwma, mod AI.  Marland Kitchen Osteoporosis   . Osteoporosis   . PAF (paroxysmal atrial fibrillation) (HCC)    a. chronic flecainide->reduced to 25 bid 08/2014.  Marland Kitchen Panic attacks    "since losing husband 1994"  . Skin cancer ?1960's   "between breasts"  . SSS (sick sinus syndrome) ([facility])    a. s/p pacemaker. b. s/p Medtronic gen change 07/2011; c.   . Type II diabetes mellitus ([place])    "controlled w/diet"    Patient Active Problem List   Diagnosis Date Noted  . Abdominal distention   . CAP (community acquired pneumonia) 06/27/2015  . Sepsis ([place]) 06/27/2015  . Pacemaker 06/27/2015  . Moderate aortic insufficiency   . Chronic diastolic CHF (congestive heart failure) ([place])   . Breast cancer ([place]) 02/03/2014  . Dyslipidemia  08/15/2012  . Atrophic vaginitis 08/15/2012  . Constipation 08/15/2012  . Vitamin D intoxication 08/15/2012  . Gram negative septicemia ([place]) 07/07/2012  . Acute on chronic diastolic HF (heart failure) ([place]) 07/06/2012  . Atrial fibrillation with RVR ([place]) 07/21/2011  . Diabetes mellitus, type II ([place])   . Panic attacks   . Anxiety   . PAF (paroxysmal atrial fibrillation) ([place])   . SSS (sick sinus syndrome) ([place])   . History of aortic insufficiency   . Hypertension     Past Surgical History:  Procedure Laterality Date  . APPENDECTOMY  1955  . BREAST CYST EXCISION  ~ 1950   right  . BREAST LUMPECTOMY WITH NEEDLE LOCALIZATION Bilateral 02/03/2014   Procedure: BILATERAL BREAST LUMPECTOMY WITH NEEDLE LOCALIZATION ON LEFT;  Surgeon: Excell Seltzer, MD;  Location: [facility];  Service: General;  Laterality: Bilateral;  . CARDIOVASCULAR STRESS TEST  12/09/2004  . CATARACT EXTRACTION W/ INTRAOCULAR LENS  IMPLANT, BILATERAL  1990's  . [place]  . DILATION AND CURETTAGE OF UTERUS  1953  . INSERT / REPLACE / REMOVE PACEMAKER  03/16/2006  . PERMANENT PACEMAKER GENERATOR CHANGE  08/03/2011   Procedure: PERMANENT PACEMAKER GENERATOR CHANGE;  Surgeon: Thompson Grayer, MD;  Location[facility] CATH LAB;  Service: Cardiovascular;;  . SKIN CANCER EXCISION  ?1960's   "between my  breasts"  . TONSILLECTOMY     "school age"    OB History    No data available       Home Medications    Prior to Admission medications   Medication Sig Start Date End Date Taking? Authorizing Provider  acetaminophen (TYLENOL) 500 MG tablet Take 500-1,000 mg by mouth every 6 (six) hours as needed for mild pain, moderate pain, fever or headache.   Yes [provider]  apixaban (ELIQUIS) 2.5 MG TABS tablet Take 1 tablet (2.5 mg total) by mouth 2 (two) times daily. Patient taking differently: Take 2.5 mg by mouth 2 (two) times daily. Takes at 1030 in the morning and at night 01/28/14  Yes [place], Peter M, MD    azelastine (ASTELIN) 0.1 % nasal spray Place 1 spray into both nostrils daily as needed for rhinitis. Use in each nostril as directed   Yes [provider]  carvedilol (COREG) 6.25 MG tablet TAKE 1 TABLET (6.25 MG TOTAL) BY MOUTH 2 (TWO) TIMES DAILY. 11/09/15  Yes [place], Peter M, MD  CRESTOR 5 MG tablet Take 2.5 mg by mouth every Monday, Wednesday, and Friday.  07/25/14  Yes [provider]  diltiazem (CARDIZEM CD) 240 MG 24 hr capsule Take 240 mg by mouth daily.  09/16/14  Yes [provider]  estradiol (ESTRACE) 0.1 MG/GM vaginal cream Place 1 Applicatorful vaginally every Monday, Wednesday, and Friday.   Yes [provider]  Flaxseed, Linseed, (FLAX SEEDS PO) Take 2.5 mLs by mouth daily with breakfast.   Yes [provider]  LORazepam (ATIVAN) 0.5 MG tablet Take 0.25-0.5 mg by mouth 3 (three) times daily. Take 0.25 mg tablet in the morning and afternoon, then 0.5 mg at bedtime   Yes [provider]  polyethylene glycol (MIRALAX / GLYCOLAX) packet Take 17 g by mouth daily as needed for mild constipation.    Yes [provider]  sertraline (ZOLOFT) 50 MG tablet Take 50 mg by mouth daily. 07/20/14  Yes [provider]  Vitamin D, Ergocalciferol, (DRISDOL) 50000 UNITS CAPS capsule Take 50,000 Units by mouth every Wednesday.   Yes [provider]    Family History Family History  Problem Relation Age of Onset  . Emphysema Father     Social History Social History  Substance Use Topics  . Smoking status: Never Smoker  . Smokeless tobacco: Never Used  . Alcohol use No     Allergies   Sulfonamide derivatives and Citalopram hydrobromide   Review of Systems Review of Systems  Constitutional: Negative for appetite change and fatigue.  HENT: Negative for congestion, ear discharge and sinus pressure.   Eyes: Negative for discharge.  Respiratory: Negative for cough and shortness of breath.   Cardiovascular:  Negative for chest pain.  Gastrointestinal: Negative for abdominal pain and diarrhea.  Genitourinary: Negative for frequency and hematuria.  Musculoskeletal: Negative for back pain.  Skin: Negative for rash.  Neurological: Positive for weakness. Negative for focal weakness, seizures and headaches.  Psychiatric/Behavioral: Negative for hallucinations.     Physical Exam Updated Vital Signs BP (!) 143/98 (BP Location: Left Arm)   Pulse 89   Temp 98.3 F (36.8 C) (Oral)   Resp 20   SpO2 98%   Physical Exam  Constitutional: She is oriented to person, place, and time. She appears well-developed.  HENT:  Head: Normocephalic.  Eyes: Conjunctivae and EOM are normal. No scleral icterus.  Neck: Neck supple. No thyromegaly present.  Cardiovascular: Normal rate and regular rhythm.  Exam reveals no gallop and no friction rub.   No murmur heard. Pulmonary/Chest: No stridor. She has no wheezes. She has no rales. She exhibits no tenderness.  Abdominal: She exhibits no distension. There is no tenderness. There is no rebound.  Musculoskeletal: Normal range of motion. She exhibits no edema.  Lymphadenopathy:    She has no cervical adenopathy.  Neurological: She is oriented to person, place, and time. She exhibits normal muscle tone. Coordination normal.  Skin: No rash noted. No erythema.  Psychiatric: She has a normal mood and affect. Her behavior is normal.     ED Treatments / Results  Labs (all labs ordered are listed, but only abnormal results are displayed) Labs Reviewed  COMPREHENSIVE METABOLIC PANEL - Abnormal; Notable for the following:       Result Value   Glucose, Bld 220 (*)    All other components within normal limits  URINALYSIS, ROUTINE W REFLEX MICROSCOPIC - Abnormal; Notable for the following:    Color, Urine STRAW (*)    Specific Gravity, Urine 1.003 (*)    Glucose, UA >=500 (*)    Hgb urine dipstick SMALL (*)    Leukocytes, UA TRACE (*)    Bacteria, UA MANY (*)     Squamous Epithelial / LPF 0-5 (*)    All other components within normal limits  CBG MONITORING, ED - Abnormal; Notable for the following:    Glucose-Capillary 204 (*)    All other components within normal limits  URINE CULTURE  CBC WITH DIFFERENTIAL/PLATELET  I-STAT TROPONIN, ED    EKG  EKG Interpretation  Date/Time:  Monday August 29 2016 12:32:37 EDT Ventricular Rate:  73 PR Interval:    QRS Duration: 96 QT Interval:  401 QTC Calculation: 442 R Axis:   18 Text Interpretation:  Sinus rhythm ST elevation, consider inferior injury Confirmed by Milton Ferguson [phone]) on 08/29/2016 3:32:16 PM       Radiology Dg Chest 2 View  Result Date: 08/29/2016 CLINICAL DATA:  Cough and shortness of breath EXAM: CHEST  2 VIEW COMPARISON:  November 17, 2015 FINDINGS: There is no edema or consolidation. Heart is upper normal in size with pulmonary vascular within normal limits. Pacer leads are unchanged in position. There is atherosclerotic calcification in the aorta. There is degenerative change in the thoracic spine. IMPRESSION: Stable cardiac silhouette. Aortic atherosclerosis. No edema or consolidation. Aortic Atherosclerosis (ICD10-I70.0). Electronically Signed   By: Lowella Grip III M.D.   On: 08/29/2016 13:39    Procedures Procedures (including critical care time)  Medications Ordered in ED Medications  cefTRIAXone (ROCEPHIN) 1 g in dextrose 5 % 50 mL IVPB (1 g Intravenous New Bag/Given 08/29/16 1549)     Initial Impression / Assessment and Plan / ED Course  I have reviewed the triage vital signs and the nursing notes.  Pertinent labs & imaging results that were available during my care of the patient were reviewed by me and considered in my medical decision making (see chart for details).     Patient will be admitted for urinary tract infection and fatigue  Final Clinical Impressions(s) / ED Diagnoses   Final diagnoses:  None    New Prescriptions New Prescriptions   No  medications on file     Milton Ferguson, MD 08/29/16 [phone]

## 2016-08-29 NOTE — ED Notes (Signed)
Bed: WA07 Expected date:  Expected time:  Means of arrival:  Comments: 

## 2016-08-29 NOTE — ED Notes (Signed)
Gave report to [place] RN for room 1414.

## 2016-08-29 NOTE — H&P (Addendum)
History and Physical    Jo [name] MCN:[id number] DOB: 1917-10-14 DOA: 08/29/2016  PCP: Crist Infante, MD  Patient coming from: Home.  Chief Complaint: Weakness.  HPI: Jo [name] is a 83 y.o. female with history of A. fib, hyperlipidemia, diastolic CHF, diabetes mellitus was brought to the ER after patient was getting progressively weak over the last week. Patient denies any chest pain shortness of breath nausea vomiting or diarrhea. Has been having some burning sensation just before urinating. Patient states she has been finding it difficult to ambulate because of the weakness.   ED Course: In the ER patient was found to be nonfocal. EKG was showing nonspecific findings and chest x-ray was unremarkable. UA was showing features consistent with UTI. Patient is being admitted for weakness and UTI.  Review of Systems: As per HPI, rest all negative.   Past Medical History:  Diagnosis Date  . Anxiety   . Arthritis    "hands; between shoulders; back; feet"  . Chronic anticoagulation    -->Eliquis  . Chronic diastolic CHF (congestive heart failure) ([place])    a. 06/2011 Echo: EF 60-65%.  . Depression   . Essential hypertension   . High risk medication use    on Flecainide  . Kidney stone    passed in [place] 2014  . Moderate aortic insufficiency    a. 06/2011 Echo: EF 60-65%, mild LVH, no rwma, mod AI.  Marland Kitchen Osteoporosis   . Osteoporosis   . PAF (paroxysmal atrial fibrillation) (HCC)    a. chronic flecainide->reduced to 25 bid 08/2014.  Marland Kitchen Panic attacks    "since losing husband 1994"  . Skin cancer ?1960's   "between breasts"  . SSS (sick sinus syndrome) (HCC)    a. s/p pacemaker. b. s/p Medtronic gen change 07/2011; c.   . Type II diabetes mellitus ([place])    "controlled w/diet"    Past Surgical History:  Procedure Laterality Date  . APPENDECTOMY  1955  . BREAST CYST EXCISION  ~ 1950   right  . BREAST LUMPECTOMY WITH NEEDLE LOCALIZATION Bilateral 02/03/2014   Procedure: BILATERAL  BREAST LUMPECTOMY WITH NEEDLE LOCALIZATION ON LEFT;  Surgeon: Excell Seltzer, MD;  Location: [facility];  Service: General;  Laterality: Bilateral;  . CARDIOVASCULAR STRESS TEST  12/09/2004  . CATARACT EXTRACTION W/ INTRAOCULAR LENS  IMPLANT, BILATERAL  1990's  . [place]  . DILATION AND CURETTAGE OF UTERUS  1953  . INSERT / REPLACE / REMOVE PACEMAKER  03/16/2006  . PERMANENT PACEMAKER GENERATOR CHANGE  08/03/2011   Procedure: PERMANENT PACEMAKER GENERATOR CHANGE;  Surgeon: Thompson Grayer, MD;  Location[facility] CATH LAB;  Service: Cardiovascular;;  . SKIN CANCER EXCISION  ?1960's   "between my breasts"  . TONSILLECTOMY     "school age"     reports that she has never smoked. She has never used smokeless tobacco. She reports that she does not drink alcohol or use drugs.  Allergies  Allergen Reactions  . Sulfonamide Derivatives Rash    "it was all over my legs"  . Citalopram Hydrobromide Other (See Comments)    Pt does not remember reaction    Family History  Problem Relation Age of Onset  . Emphysema Father     Prior to Admission medications   Medication Sig Start Date End Date Taking? Authorizing Provider  acetaminophen (TYLENOL) 500 MG tablet Take 500-1,000 mg by mouth every 6 (six) hours as needed for mild pain, moderate pain, fever or headache.  Yes [provider]  apixaban (ELIQUIS) 2.5 MG TABS tablet Take 1 tablet (2.5 mg total) by mouth 2 (two) times daily. Patient taking differently: Take 2.5 mg by mouth 2 (two) times daily. Takes at 1030 in the morning and at night 01/28/14  Yes [place], Peter M, MD  azelastine (ASTELIN) 0.1 % nasal spray Place 1 spray into both nostrils daily as needed for rhinitis. Use in each nostril as directed   Yes [provider]  carvedilol (COREG) 6.25 MG tablet TAKE 1 TABLET (6.25 MG TOTAL) BY MOUTH 2 (TWO) TIMES DAILY. 11/09/15  Yes [place], Peter M, MD  CRESTOR 5 MG tablet Take 2.5 mg by mouth every Monday, Wednesday, and  Friday.  07/25/14  Yes [provider]  diltiazem (CARDIZEM CD) 240 MG 24 hr capsule Take 240 mg by mouth daily.  09/16/14  Yes [provider]  estradiol (ESTRACE) 0.1 MG/GM vaginal cream Place 1 Applicatorful vaginally every Monday, Wednesday, and Friday.   Yes [provider]  Flaxseed, Linseed, (FLAX SEEDS PO) Take 2.5 mLs by mouth daily with breakfast.   Yes [provider]  LORazepam (ATIVAN) 0.5 MG tablet Take 0.25-0.5 mg by mouth 3 (three) times daily. Take 0.25 mg tablet in the morning and afternoon, then 0.5 mg at bedtime   Yes [provider]  polyethylene glycol (MIRALAX / GLYCOLAX) packet Take 17 g by mouth daily as needed for mild constipation.    Yes [provider]  sertraline (ZOLOFT) 50 MG tablet Take 50 mg by mouth daily. 07/20/14  Yes [provider]  Vitamin D, Ergocalciferol, (DRISDOL) 50000 UNITS CAPS capsule Take 50,000 Units by mouth every Wednesday.   Yes [provider]    Physical Exam: Vitals:   08/29/16 1600 08/29/16 1630 08/29/16 1645 08/29/16 1700  BP: (!) 164/67 (!) 157/61 (!) 155/64 (!) 156/60  Pulse: 64 64 65 65  Resp: 17 20 18 17   Temp:      TempSrc:      SpO2: 93% 91% 91% 90%      Constitutional: Moderately built and nourished. Vitals:   08/29/16 1600 08/29/16 1630 08/29/16 1645 08/29/16 1700  BP: (!) 164/67 (!) 157/61 (!) 155/64 (!) 156/60  Pulse: 64 64 65 65  Resp: 17 20 18 17   Temp:      TempSrc:      SpO2: 93% 91% 91% 90%   Eyes: Anicteric. no pallor. ENMT: No discharge from the ears eyes nose or mouth. Neck: No JVD appreciated no mass felt. Respiratory: No rhonchi or crepitations. Cardiovascular: S1-S2 heard no murmurs appreciated. Abdomen: Soft nontender bowel sounds present. No guarding or rigidity. Musculoskeletal: No edema. No joint effusion. Skin: No rash. Skin appeared warm. Neurologic: Alert awake oriented to time place and person. Moves all  extremities. Psychiatric: Appears normal. Normal affect.   Labs on Admission: I have personally reviewed following labs and imaging studies  CBC:  Recent Labs Lab 08/29/16 1242  WBC 10.3  NEUTROABS 7.5  HGB 13.0  HCT 38.0  MCV 92.0  PLT 702   Basic Metabolic Panel:  Recent Labs Lab 08/29/16 1242  NA 141  K 4.1  CL 104  CO2 28  GLUCOSE 220*  BUN 15  CREATININE 0.64  CALCIUM 9.5   GFR: CrCl cannot be calculated (Unknown ideal weight.). Liver Function Tests:  Recent Labs Lab 08/29/16 1242  AST 18  ALT 14  ALKPHOS 69  BILITOT 0.5  PROT 7.4  ALBUMIN 3.9   No results  for input(s): LIPASE, AMYLASE in the last 168 hours. No results for input(s): AMMONIA in the last 168 hours. Coagulation Profile: No results for input(s): INR, PROTIME in the last 168 hours. Cardiac Enzymes: No results for input(s): CKTOTAL, CKMB, CKMBINDEX, TROPONINI in the last 168 hours. BNP (last 3 results) No results for input(s): PROBNP in the last 8760 hours. HbA1C: No results for input(s): HGBA1C in the last 72 hours. CBG:  Recent Labs Lab 08/29/16 1239  GLUCAP 204*   Lipid Profile: No results for input(s): CHOL, HDL, LDLCALC, TRIG, CHOLHDL, LDLDIRECT in the last 72 hours. Thyroid Function Tests: No results for input(s): TSH, T4TOTAL, FREET4, T3FREE, THYROIDAB in the last 72 hours. Anemia Panel: No results for input(s): VITAMINB12, FOLATE, FERRITIN, TIBC, IRON, RETICCTPCT in the last 72 hours. Urine analysis:    Component Value Date/Time   COLORURINE STRAW (A) 08/29/2016 1300   APPEARANCEUR CLEAR 08/29/2016 1300   LABSPEC 1.003 (L) 08/29/2016 1300   PHURINE 8.0 08/29/2016 1300   GLUCOSEU >=500 (A) 08/29/2016 1300   HGBUR SMALL (A) 08/29/2016 1300   BILIRUBINUR NEGATIVE 08/29/2016 1300   KETONESUR NEGATIVE 08/29/2016 1300   PROTEINUR NEGATIVE 08/29/2016 1300   UROBILINOGEN 1.0 07/06/2012 1130   NITRITE NEGATIVE 08/29/2016 1300   LEUKOCYTESUR TRACE (A) 08/29/2016 1300    Sepsis Labs: @LABRCNTIP (procalcitonin:4,lacticidven:4) )No results found for this or any previous visit (from the past 240 hour(s)).   Radiological Exams on Admission: Dg Chest 2 View  Result Date: 08/29/2016 CLINICAL DATA:  Cough and shortness of breath EXAM: CHEST  2 VIEW COMPARISON:  November 17, 2015 FINDINGS: There is no edema or consolidation. Heart is upper normal in size with pulmonary vascular within normal limits. Pacer leads are unchanged in position. There is atherosclerotic calcification in the aorta. There is degenerative change in the thoracic spine. IMPRESSION: Stable cardiac silhouette. Aortic atherosclerosis. No edema or consolidation. Aortic Atherosclerosis (ICD10-I70.0). Electronically Signed   By: Lowella Grip III M.D.   On: 08/29/2016 13:39    EKG: Independently reviewed. Normal sinus rhythm with nonspecific ST-T changes.  Assessment/Plan Active Problems:   PAF (paroxysmal atrial fibrillation) (HCC)   SSS (sick sinus syndrome) (HCC)   Hypertension   Weakness   Acute lower UTI    1. Progressive weakness with UTI - will keep patient on ceftriaxone and check urine cultures. Since patient has been having weakness will check troponin. Physical therapy consult. Check TSH. Will check 2-D echo. 2. Paroxysmal atrial fibrillation - presently in sinus rhythm. Continue Apixaban chads 2 vasc score is more than 2. Continue beta blockers and Cardizem. 3. Sick sinus syndrome status post pacemaker placement. 4. Hypertension - continue Cardizem and beta blocker. 5. Hyperlipidemia on statin. 6. Diabetes mellitus type 2 not on any medications. I have placed patient on sliding scale coverage. Follow CBG trends.  I have reviewed patient's old charts and labs.   DVT prophylaxis: Apixaban. Code Status: DO NOT RESUSCITATE.  Family Communication: Patient's son.  Disposition Plan: Home.  Consults called: Physical therapy.  Admission status: Observation.    Rise Patience MD [facility] Pager [phone].  If 7PM-7AM, please contact night-coverage [email or web address] Password [email or web address]  08/29/2016, 6:07 PM

## 2016-08-29 NOTE — Progress Notes (Signed)
Pharmacy Antibiotic Note  Jo [name] is a 83 y.o. female admitted on 08/29/2016 with weakness.  Pharmacy has been consulted for ceftriaxone dosing for presumed UTI.  Plan:  Ceftriaxone 1g IV q24h  No further dosing adjustments needed, Pharmacy will sign off     Temp (24hrs), Avg:98.1 F (36.7 C), Min:97.8 F (36.6 C), Max:98.3 F (36.8 C)   Recent Labs Lab 08/29/16 1242  WBC 10.3  CREATININE 0.64    CrCl cannot be calculated (Unknown ideal weight.).    Allergies  Allergen Reactions  . Sulfonamide Derivatives Rash    "it was all over my legs"  . Citalopram Hydrobromide Other (See Comments)    Pt does not remember reaction    Antimicrobials this admission: 8/6 Ceftriaxone >>  Dose adjustments this admission: none  Microbiology results: 8/6 Urine:   Thank you for allowing pharmacy to be a part of this patient's care.  Peggyann Juba, PharmD, BCPS Pager: [phone] 08/29/2016 6:15 PM

## 2016-08-30 ENCOUNTER — Observation Stay (HOSPITAL_COMMUNITY): Payer: Medicare Other

## 2016-08-30 ENCOUNTER — Observation Stay (HOSPITAL_BASED_OUTPATIENT_CLINIC_OR_DEPARTMENT_OTHER): Payer: Medicare Other

## 2016-08-30 DIAGNOSIS — I110 Hypertensive heart disease with heart failure: Secondary | ICD-10-CM | POA: Diagnosis present

## 2016-08-30 DIAGNOSIS — Z79899 Other long term (current) drug therapy: Secondary | ICD-10-CM | POA: Diagnosis not present

## 2016-08-30 DIAGNOSIS — Z66 Do not resuscitate: Secondary | ICD-10-CM | POA: Diagnosis present

## 2016-08-30 DIAGNOSIS — B9620 Unspecified Escherichia coli [E. coli] as the cause of diseases classified elsewhere: Secondary | ICD-10-CM | POA: Diagnosis present

## 2016-08-30 DIAGNOSIS — F419 Anxiety disorder, unspecified: Secondary | ICD-10-CM | POA: Diagnosis present

## 2016-08-30 DIAGNOSIS — M1991 Primary osteoarthritis, unspecified site: Secondary | ICD-10-CM | POA: Diagnosis present

## 2016-08-30 DIAGNOSIS — I10 Essential (primary) hypertension: Secondary | ICD-10-CM

## 2016-08-30 DIAGNOSIS — F329 Major depressive disorder, single episode, unspecified: Secondary | ICD-10-CM | POA: Diagnosis present

## 2016-08-30 DIAGNOSIS — I495 Sick sinus syndrome: Secondary | ICD-10-CM

## 2016-08-30 DIAGNOSIS — I351 Nonrheumatic aortic (valve) insufficiency: Secondary | ICD-10-CM | POA: Diagnosis not present

## 2016-08-30 DIAGNOSIS — Z7901 Long term (current) use of anticoagulants: Secondary | ICD-10-CM | POA: Diagnosis not present

## 2016-08-30 DIAGNOSIS — R531 Weakness: Secondary | ICD-10-CM

## 2016-08-30 DIAGNOSIS — I480 Paroxysmal atrial fibrillation: Secondary | ICD-10-CM | POA: Diagnosis not present

## 2016-08-30 DIAGNOSIS — N390 Urinary tract infection, site not specified: Secondary | ICD-10-CM | POA: Diagnosis not present

## 2016-08-30 DIAGNOSIS — M810 Age-related osteoporosis without current pathological fracture: Secondary | ICD-10-CM | POA: Diagnosis present

## 2016-08-30 DIAGNOSIS — E785 Hyperlipidemia, unspecified: Secondary | ICD-10-CM | POA: Diagnosis present

## 2016-08-30 DIAGNOSIS — Z85828 Personal history of other malignant neoplasm of skin: Secondary | ICD-10-CM | POA: Diagnosis not present

## 2016-08-30 DIAGNOSIS — E119 Type 2 diabetes mellitus without complications: Secondary | ICD-10-CM | POA: Diagnosis present

## 2016-08-30 DIAGNOSIS — I700 Atherosclerosis of aorta: Secondary | ICD-10-CM | POA: Diagnosis present

## 2016-08-30 DIAGNOSIS — Z881 Allergy status to other antibiotic agents status: Secondary | ICD-10-CM | POA: Diagnosis not present

## 2016-08-30 DIAGNOSIS — Z6821 Body mass index (BMI) 21.0-21.9, adult: Secondary | ICD-10-CM | POA: Diagnosis not present

## 2016-08-30 DIAGNOSIS — F410 Panic disorder [episodic paroxysmal anxiety] without agoraphobia: Secondary | ICD-10-CM | POA: Diagnosis present

## 2016-08-30 DIAGNOSIS — Z888 Allergy status to other drugs, medicaments and biological substances status: Secondary | ICD-10-CM | POA: Diagnosis not present

## 2016-08-30 DIAGNOSIS — Z950 Presence of cardiac pacemaker: Secondary | ICD-10-CM | POA: Diagnosis not present

## 2016-08-30 DIAGNOSIS — I5032 Chronic diastolic (congestive) heart failure: Secondary | ICD-10-CM | POA: Diagnosis present

## 2016-08-30 LAB — TSH: TSH: 2.332 u[IU]/mL (ref 0.350–4.500)

## 2016-08-30 LAB — BASIC METABOLIC PANEL
Anion gap: 10 (ref 5–15)
BUN: 17 mg/dL (ref 6–20)
CALCIUM: 9.3 mg/dL (ref 8.9–10.3)
CHLORIDE: 106 mmol/L (ref 101–111)
CO2: 26 mmol/L (ref 22–32)
CREATININE: 0.64 mg/dL (ref 0.44–1.00)
GFR calc non Af Amer: >60 mL/min (ref >60)
Glucose, Bld: 152 mg/dL — ABNORMAL HIGH (ref 65–99)
Potassium: 3.8 mmol/L (ref 3.5–5.1)
SODIUM: 142 mmol/L (ref 135–145)

## 2016-08-30 LAB — ECHOCARDIOGRAM COMPLETE
HEIGHTINCHES: 63 in
WEIGHTICAEL: 1897.72 [oz_av]

## 2016-08-30 LAB — CBC
HCT: 37 % (ref 36.0–46.0)
Hemoglobin: 12.5 g/dL (ref 12.0–15.0)
MCH: 31 pg (ref 26.0–34.0)
MCHC: 33.8 g/dL (ref 30.0–36.0)
MCV: 91.8 fL (ref 78.0–100.0)
PLATELETS: 235 10*3/uL (ref 150–400)
RBC: 4.03 MIL/uL (ref 3.87–5.11)
RDW: 13.2 % (ref 11.5–15.5)
WBC: 10.7 10*3/uL — AB (ref 4.0–10.5)

## 2016-08-30 LAB — GLUCOSE, CAPILLARY
GLUCOSE-CAPILLARY: 168 mg/dL — AB (ref 65–99)
GLUCOSE-CAPILLARY: 161 mg/dL — AB (ref 65–99)
Glucose-Capillary: 159 mg/dL — ABNORMAL HIGH (ref 65–99)
Glucose-Capillary: 124 mg/dL — ABNORMAL HIGH (ref 65–99)

## 2016-08-30 LAB — TROPONIN I
Troponin I: <0.03 ng/mL (ref <0.03)
Troponin I: <0.03 ng/mL (ref <0.03)

## 2016-08-30 MED ORDER — SODIUM CHLORIDE 0.9 % IV SOLN
INTRAVENOUS | Status: AC
  Administered 2016-08-30 – 2016-08-31 (×2): via INTRAVENOUS

## 2016-08-30 MED ORDER — GLUCERNA SHAKE PO LIQD
237.0000 mL | Freq: Three times a day (TID) | ORAL | Status: DC
  Administered 2016-08-31: 237 mL via ORAL
  Filled 2016-08-30 (×4): qty 237

## 2016-08-30 NOTE — Progress Notes (Signed)
Initial Nutrition Assessment  DOCUMENTATION CODES:   Non-severe (moderate) malnutrition in context of acute illness/injury  INTERVENTION:   Glucerna Shake po TID, each supplement provides 220 kcal and 10 grams of protein  NUTRITION DIAGNOSIS:   Malnutrition (Moderate) related to acute illness (weakness with UTI) as evidenced by moderate depletions of muscle mass, moderate depletion of body fat.  GOAL:   Patient will meet greater than or equal to 90% of their needs  MONITOR:   PO intake, Supplement acceptance, Labs, Weight trends  REASON FOR ASSESSMENT:   Malnutrition Screening Tool   ASSESSMENT:   Pt with PMH of HLD, diastolic CHF, osteoporosis, and DM. Presents this admission progressive weakness with UTI.   Pt reports appetite has decreased for 3 weeks prior to admission related to increased weakness and fatigue. States she gets meals on wheels delivered to her house and was eating 3 meals/day before the new onset weakness. She decreased her meal intake to 1 per day since feeling bad. She does consume Glucerna at home. Suspect pt has not consuming >50% of her estimated energy requirements for > 1 week, which is significant.   Reports a UBW of 125 lb, the last time being 3 weeks ago. Records show pt has lost 6% in body wt since Feb 2018. This is not significant.   Nutrition-Focused physical exam completed. Findings are moderate to severe fat depletion, moderate to severe muscle depletion, and no edema. Pt shows to have severe depletions in bilateral lower extremities, but suspect it's from decreased mobility. Pt shows to be moderately malnourished at this time with a high risk of being severe if wt loss persists. Will provide supplementation in hopes to preserve lean body mass. Included protein supplement information in discharge instructions.   Medications reviewed and include: SSI, Vit D, IV abx Labs reviewed: CBG 152-220  Diet Order:  Diet Heart Room service appropriate?  Yes with Assist; Fluid consistency: Thin; Fluid restriction: 1200 mL Fluid  Skin:  Reviewed, no issues  Last BM:  08/27/16  Height:   Ht Readings from Last 1 Encounters:  08/29/16 5\' 3"  (1.6 m)    Weight:   Wt Readings from Last 1 Encounters:  08/30/16 118 lb 9.7 oz (53.8 kg)    Ideal Body Weight:  52.3 kg  BMI:  Body mass index is 21.01 kg/m.  Estimated Nutritional Needs:   Kcal:  1400-1600 (26-30 kcal/kg)  Protein:  70-80 grams (1.3-1.5 g/kg)  Fluid:  >1.4 L/day  EDUCATION NEEDS:   No education needs identified at this time  [place], LDN Clinical Nutrition Pager # - [phone]

## 2016-08-30 NOTE — Care Management Obs Status (Signed)
[facility] NOTIFICATION   Patient Details  Name: Zerline Melchior Ade MRN: [id number] Date of Birth: Nov 14, 1917   Medicare Observation Status Notification Given:  Yes    MahabirJuliann Pulse, RN 08/30/2016, 11:41 AM

## 2016-08-30 NOTE — Progress Notes (Signed)
PROGRESS NOTE    Jo [name]  FGH:[id number] DOB: 01-17-1918 DOA: 08/29/2016 PCP: Crist Infante, MD   Brief Narrative:  83 year old female with history of atrial fibrillation on anticoagulation, diabetes type 2, hyperlipidemia and diastolic CHF came to the ER with complaints of progressive weakness. She was found a urinary tract infection.   Assessment & Plan:   Active Problems:   PAF (paroxysmal atrial fibrillation) (HCC)   SSS (sick sinus syndrome) (HCC)   Hypertension   Weakness   Acute lower UTI  Generalized weakness Urinary tract infection -Admitted for further care. Cultures have been ordered -Continue IV ceftriaxone. Order IV fluids due to poor by mouth intake -Invalid by physical therapy who recommends home PT versus SNIF. Family to make the decision with the patient and care manager depending on if she is agreeable to the cost -TSH-2.3 -Echocardiogram ordered on 08/29/2016-pending  Paroxysmal atrial fibrillation -On Eliquis  -Continue Coreg 6.25 mg twice daily and Cardizem 240 mg daily -Depending on risks and benefits given possible risk of fall and her age I have spoken to her daughter extensively to speak with her primary care physician and cardiologist to reevaluate the necessity of anticoagulation. Daughter understands and will speak to them about this during their next visit upon discharge.   Sick sinus syndrome status post pacemaker  Hypertension -Continue Coreg and Cardizem  Hyperlipidemia -And tinea Crestor 2.5 mg  Diabetes type 2 -Not her medications. Continue sliding scale and Accu-Chek  History of anxiety and depression -On Ativan at home as needed-continue. This can also increase her risk of fall which I have discussed with the patient's daughter -Continue Zoloft 50 mg daily   DVT prophylaxis: Eliquis Code Status: DO NOT RESUSCITATE Family Communication:  Spoke with the patient's daughter, Jo [name] over the phone Disposition Plan: Likely  discharge in next 24-48 hours  Consultants:   None  Procedures:   None  Antimicrobials:   Rocephin 8/6   Subjective: Patient denies any complaints this morning but states she has been feeling progressive weakness at home. She also reports of one episode of falling couple of days ago when trying to get out of bed when she had her back of the shoulder and lower extremity causing some bruising. She denied any loss of consciousness or head trauma. She is on Eliquis and is aware of increased risk of bleeding.  I also spoke with the patient's daughter, Jo [name] extensively regarding her ongoing care about urinary tract infection and possible disposition per physical therapy evaluation and she stated she will work with case manager in terms of going home versus skilled nursing facility. Daughter also tells me that her anticoagulation is managed by her PCP and her cardiologist. Jo [name] extensively discussed about risk and benefit of Eliquis especially given her weakness. Daughter states usually patient ambulates well with the help of walker and has only one episode of fall. During previous physical therapy patient has done well at home and her family checks up on her frequently.  Objective: Vitals:   08/29/16 2029 08/30/16 0547 08/30/16 0621 08/30/16 0957  BP: (!) 151/62 (!) 182/71  (!) 176/74  Pulse: 69 79    Resp: 18 18    Temp: 98.1 F (36.7 C) 98 F (36.7 C)    TempSrc: Oral Oral    SpO2: 93% 94%    Weight:   53.8 kg (118 lb 9.7 oz)   Height:        Intake/Output Summary (Last 24 hours) at 08/30/16 1144 Last data  filed at 08/30/16 0093  Gross per 24 hour  Intake                0 ml  Output              500 ml  Net             -500 ml   Filed Weights   08/29/16 1819 08/30/16 0621  Weight: 54.3 kg (119 lb 11.4 oz) 53.8 kg (118 lb 9.7 oz)    Examination:  General exam: Appears calm and comfortable; Dry mouth, elderly frail-appearing. Heart of hearing Respiratory system: Clear to  auscultation. Respiratory effort normal. Cardiovascular system: S1 & S2 heard, irregular rate and rhythm. No JVD, murmurs, rubs, gallops or clicks. No pedal edema. Gastrointestinal system: Abdomen is nondistended, soft and nontender. No organomegaly or masses felt. Normal bowel sounds heard. Central nervous system: Alert and oriented. No focal neurological deficits. Extremities: Symmetric 5 x 5 power. Skin: Some chronic skin bruising and scratch marks Psychiatry: Judgement and insight appear normal. Mood & affect appropriate.     Data Reviewed:   CBC:  Recent Labs Lab 08/29/16 1242 08/30/16 0539  WBC 10.3 10.7*  NEUTROABS 7.5  --   HGB 13.0 12.5  HCT 38.0 37.0  MCV 92.0 91.8  PLT 245 818   Basic Metabolic Panel:  Recent Labs Lab 08/29/16 1242 08/30/16 0539  NA 141 142  K 4.1 3.8  CL 104 106  CO2 28 26  GLUCOSE 220* 152*  BUN 15 17  CREATININE 0.64 0.64  CALCIUM 9.5 9.3   GFR: Estimated Creatinine Clearance: 33.3 mL/min (by C-G formula based on SCr of 0.64 mg/dL). Liver Function Tests:  Recent Labs Lab 08/29/16 1242  AST 18  ALT 14  ALKPHOS 69  BILITOT 0.5  PROT 7.4  ALBUMIN 3.9   No results for input(s): LIPASE, AMYLASE in the last 168 hours. No results for input(s): AMMONIA in the last 168 hours. Coagulation Profile: No results for input(s): INR, PROTIME in the last 168 hours. Cardiac Enzymes:  Recent Labs Lab 08/29/16 1900 08/29/16 2340 08/30/16 0539  TROPONINI <0.03 <0.03 <0.03   BNP (last 3 results) No results for input(s): PROBNP in the last 8760 hours. HbA1C: No results for input(s): HGBA1C in the last 72 hours. CBG:  Recent Labs Lab 08/29/16 1239 08/30/16 0739  GLUCAP 204* 159*   Lipid Profile: No results for input(s): CHOL, HDL, LDLCALC, TRIG, CHOLHDL, LDLDIRECT in the last 72 hours. Thyroid Function Tests:  Recent Labs  08/29/16 2340  TSH 2.332   Anemia Panel: No results for input(s): VITAMINB12, FOLATE, FERRITIN, TIBC,  IRON, RETICCTPCT in the last 72 hours. Sepsis Labs: No results for input(s): PROCALCITON, LATICACIDVEN in the last 168 hours.  No results found for this or any previous visit (from the past 240 hour(s)).       Radiology Studies: Dg Chest 2 View  Result Date: 08/29/2016 CLINICAL DATA:  Cough and shortness of breath EXAM: CHEST  2 VIEW COMPARISON:  November 17, 2015 FINDINGS: There is no edema or consolidation. Heart is upper normal in size with pulmonary vascular within normal limits. Pacer leads are unchanged in position. There is atherosclerotic calcification in the aorta. There is degenerative change in the thoracic spine. IMPRESSION: Stable cardiac silhouette. Aortic atherosclerosis. No edema or consolidation. Aortic Atherosclerosis (ICD10-I70.0). Electronically Signed   By: Lowella Grip III M.D.   On: 08/29/2016 13:39        Scheduled Meds: . apixaban  2.5 mg Oral BID  . carvedilol  6.25 mg Oral BID WC  . diltiazem  240 mg Oral Daily  . feeding supplement (ENSURE ENLIVE)  237 mL Oral BID BM  . insulin aspart  0-9 Units Subcutaneous TID WC  . LORazepam  0.25 mg Oral 2 times per day   And  . LORazepam  0.5 mg Oral QHS  . [START ON 08/31/2016] rosuvastatin  2.5 mg Oral Q M,W,F  . sertraline  50 mg Oral Daily  . [START ON 08/31/2016] Vitamin D (Ergocalciferol)  50,000 Units Oral Q Wed   Continuous Infusions: . cefTRIAXone (ROCEPHIN)  IV       LOS: 0 days    Time spent: 32 mins     Jo Laroque Arsenio Loader, MD [facility] Pager [phone]   If 7PM-7AM, please contact night-coverage [email or web address] Password [email or web address] 08/30/2016, 11:44 AM

## 2016-08-30 NOTE — Care Management Note (Signed)
Case Management Note  Patient Details  Name: Jo [name] MRN: [id number] Date of Birth: [birth year]  Subjective/Objective:83 y/o f admitted w/weakness. From home, alone,has rw, hard of hearing.Has dtr/son support-patient deferred to contact dtr-Jo [name] c#[phone]-informed her of medicare OBS notice,PT recc-HH w/24hr supv/SNF, & if she was able private pay for SNF(estimate$7,000/month). Patient would prefer [facility] if SNF. CSW already following.Dtr will come to hospital w/n the hour to discuss further.  Will leave HHC/private sitter forms in rm,w/OBS notice.                 Action/Plan:d/c home w/HHC.   Expected Discharge Date:   (unknown)               Expected Discharge Plan:  [place]  In-House Referral:  Clinical Social Work  Discharge planning Services  CM Consult  Post Acute Care Choice:  Durable Medical Equipment (rw) Choice offered to:  Adult Children  DME Arranged:    DME Agency:     HH Arranged:    HH Agency:     Status of Service:  In process, will continue to follow  If discussed at Long Length of Stay Meetings, dates discussed:    Additional Comments:  Dessa Phi, RN 08/30/2016, 10:57 AM

## 2016-08-30 NOTE — Discharge Instructions (Signed)

## 2016-08-30 NOTE — Care Management Note (Signed)
Case Management Note  Patient Details  Name: Kearra Calkin Huge MRN: [id number] Date of Birth: January 10, 1918  Subjective/Objective: CM spoke to dtr/patient in rm about [facility] vs SNF. Patient/dtr voiced understanding about medicare observation status, & HHC services(intermittent), vs SNF(private pay under observation status estimate $7,000/month. Patient/dtr voiced understanding-patient does not want to pay privately for SNF,agree to home w/HHC-[facility] chosen rep Karen-accepted. HHRN/PT/OT/aide,csw. Also provided w/private sitter list(out of pocket cost)-Patient dtr agree & voiced understanding.                   Action/Plan:   Expected Discharge Date:   (unknown)               Expected Discharge Plan:  [place]  In-House Referral:  Clinical Social Work  Discharge planning Services  CM Consult  Post Acute Care Choice:  Durable Medical Equipment (rw) Choice offered to:  Adult Children  DME Arranged:    DME Agency:     HH Arranged:  RN, PT, OT, Nurse's Aide, Social Work [employer] Agency:  [facility]  Status of Service:  In process, will continue to follow  If discussed at Long Length of Stay Meetings, dates discussed:    Additional Comments:  Dessa Phi, RN 08/30/2016, 1:28 PM

## 2016-08-30 NOTE — Progress Notes (Signed)
  Echocardiogram 2D Echocardiogram has been performed.  Jo [name] 08/30/2016, 12:42 PM

## 2016-08-30 NOTE — Evaluation (Signed)
Physical Therapy Evaluation Patient Details Name: Jo [name] MRN: [id number] DOB: [birth year] Today's Date: 08/30/2016   History of Present Illness  Jo [name] is a 83 y.o. female with history of A. fib, hyperlipidemia, diastolic CHF, diabetes mellitus was brought to the ER 08/29/16 after patient was getting progressively weak over the last week. UA consistent with UTI.  Clinical Impression  The patient ambulated with RW and min assist. No family present. Pt admitted with above diagnosis. Pt currently with functional limitations due to the deficits listed below (see PT Problem List).  Pt will benefit from skilled PT to increase their independence and safety with mobility to allow discharge to the venue listed below.       Follow Up Recommendations Home health PT;Supervision/Assistance - 24 hour/ SNF-The patient requests [facility] for [facility]- stated " I can't do for myself now".- no family present to discuss if 24 hour caregivers available.     Equipment Recommendations  None recommended by PT    Recommendations for Other Services   OT    Precautions / Restrictions Precautions Precautions: Fall      Mobility  Bed Mobility Overal bed mobility: Modified Independent                Transfers Overall transfer level: Needs assistance Equipment used: Rolling walker (2 wheeled) Transfers: Sit to/from Stand Sit to Stand: Min guard         General transfer comment: steady assist to stand  Ambulation/Gait Ambulation/Gait assistance: Min assist Ambulation Distance (Feet): 100 Feet (then 80) Assistive device: Rolling walker (2 wheeled) Gait Pattern/deviations: Step-through pattern;Decreased stride length     General Gait Details: slow speed. Requested to sit down  in hall chair stating" My knees are weak".  Stairs            Wheelchair Mobility    Modified Rankin (Stroke Patients Only)       Balance Overall balance assessment: Needs assistance Sitting-balance  support: Feet supported;No upper extremity supported Sitting balance-Leahy Scale: Good     Standing balance support: During functional activity;Bilateral upper extremity supported Standing balance-Leahy Scale: Fair                               Pertinent Vitals/Pain Pain Assessment: No/denies pain    Home Living Family/patient expects to be discharged to:: Private residence Living Arrangements: Alone Available Help at Discharge: Family;Available PRN/intermittently Type of Home: House Home Access: Stairs to enter Entrance Stairs-Rails: None Entrance Stairs-Number of Steps: 3 Home Layout: One level Home Equipment: Walker - 4 wheels Additional Comments: has help line    Prior Function Level of Independence: Needs assistance   Gait / Transfers Assistance Needed: Mod I with rollator, states that she has assistance on steps.           Hand Dominance        Extremity/Trunk Assessment   Upper Extremity Assessment Upper Extremity Assessment: Generalized weakness    Lower Extremity Assessment Lower Extremity Assessment: Generalized weakness       Communication   Communication: HOH  Cognition Arousal/Alertness: Awake/alert Behavior During Therapy: WFL for tasks assessed/performed Overall Cognitive Status: Within Functional Limits for tasks assessed                                        General Comments  Exercises     Assessment/Plan    PT Assessment Patient needs continued PT services  PT Problem List Decreased strength;Decreased activity tolerance;Decreased mobility;Decreased knowledge of precautions;Decreased safety awareness;Decreased knowledge of use of DME       PT Treatment Interventions DME instruction;Gait training;Functional mobility training;Therapeutic activities;Patient/family education    PT Goals (Current goals can be found in the Care Plan section)  Acute Rehab PT Goals Patient Stated Goal: I'd like to go to  [place]. PT Goal Formulation: With patient Time For Goal Achievement: 09/13/16 Potential to Achieve Goals: Good    Frequency Min 3X/week   Barriers to discharge Decreased caregiver support      Co-evaluation               AM-PAC PT "6 Clicks" Daily Activity  Outcome Measure Difficulty turning over in bed (including adjusting bedclothes, sheets and blankets)?: None Difficulty moving from lying on back to sitting on the side of the bed? : None Difficulty sitting down on and standing up from a chair with arms (e.g., wheelchair, bedside commode, etc,.)?: A Little Help needed moving to and from a bed to chair (including a wheelchair)?: A Lot Help needed walking in hospital room?: A Lot Help needed climbing 3-5 steps with a railing? : Total 6 Click Score: 16    End of Session Equipment Utilized During Treatment: Gait belt Activity Tolerance: Patient tolerated treatment well Patient left: with call bell/phone within reach;with bed alarm set Nurse Communication: Mobility status PT Visit Diagnosis: Unsteadiness on feet (R26.81);Difficulty in walking, not elsewhere classified (R26.2)    Time: 0938-1829 PT Time Calculation (min) (ACUTE ONLY): 24 min   Charges:   PT Evaluation $PT Eval Low Complexity: 1 Low PT Treatments $Gait Training: 8-22 mins   PT G Codes:   PT G-Codes **NOT FOR INPATIENT CLASS** Functional Assessment Tool Used: Clinical judgement Functional Limitation: Mobility: Walking and moving around Mobility: Walking and Moving Around Current Status (H3716): At least 20 percent but less than 40 percent impaired, limited or restricted Mobility: Walking and Moving Around Goal Status [phone]): 0 percent impaired, limited or restricted    [facility] PT [phone]   Claretha Cooper 08/30/2016, 9:11 AM

## 2016-08-31 DIAGNOSIS — I480 Paroxysmal atrial fibrillation: Secondary | ICD-10-CM

## 2016-08-31 LAB — URINE CULTURE: Culture: >=100000 — AB

## 2016-08-31 LAB — GLUCOSE, CAPILLARY
Glucose-Capillary: 227 mg/dL — ABNORMAL HIGH (ref 65–99)
Glucose-Capillary: 146 mg/dL — ABNORMAL HIGH (ref 65–99)

## 2016-08-31 MED ORDER — DILTIAZEM HCL 25 MG/5ML IV SOLN
10.0000 mg | Freq: Once | INTRAVENOUS | Status: DC
  Filled 2016-08-31: qty 5

## 2016-08-31 MED ORDER — ALUM & MAG HYDROXIDE-SIMETH 200-200-20 MG/5ML PO SUSP
30.0000 mL | ORAL | Status: DC | PRN
  Filled 2016-08-31: qty 30

## 2016-08-31 MED ORDER — DILTIAZEM LOAD VIA INFUSION: 10.0000 mg | Freq: Once | INTRAVENOUS | Status: DC

## 2016-08-31 MED ORDER — CEPHALEXIN 500 MG PO CAPS: 500.0000 mg | ORAL_CAPSULE | Freq: Four times a day (QID) | ORAL | 0 refills | Status: AC

## 2016-08-31 NOTE — Progress Notes (Signed)
Physical Therapy Treatment Patient Details Name: Jo [name] MRN: [id number] DOB: 1917/10/12 Today's Date: 08/31/2016    History of Present Illness Jo [name] is a 83 y.o. female with history of A. fib, hyperlipidemia, diastolic CHF, diabetes mellitus was brought to the ER 08/29/16 after patient was getting progressively weak over the last week. UA consistent with UTI.    PT Comments    Pt assisted with ambulating in hallway as tolerated.  Recommend 24/7 assist upon d/c as pt does fatigue quickly.  Follow Up Recommendations  Home health PT;Supervision/Assistance - 24 hour     Equipment Recommendations  None recommended by PT    Recommendations for Other Services       Precautions / Restrictions Precautions Precautions: Fall    Mobility  Bed Mobility Overal bed mobility: Modified Independent                Transfers Overall transfer level: Needs assistance Equipment used: 4-wheeled walker Transfers: Sit to/from Stand Sit to Stand: Min guard         General transfer comment: min/guard for safety, cues for use of rollator brakes  Ambulation/Gait Ambulation/Gait assistance: Min guard Ambulation Distance (Feet): 120 Feet Assistive device: 4-wheeled walker Gait Pattern/deviations: Step-through pattern;Decreased stride length     General Gait Details: pt required 3 standing rest breaks due to fatigue, reports feeling weak as well, distance to tolerance   Stairs            Wheelchair Mobility    Modified Rankin (Stroke Patients Only)       Balance                                            Cognition Arousal/Alertness: Awake/alert Behavior During Therapy: WFL for tasks assessed/performed Overall Cognitive Status: Within Functional Limits for tasks assessed                                        Exercises      General Comments        Pertinent Vitals/Pain Pain Assessment: Faces Faces Pain Scale: Hurts  little more Pain Location: neck pain Pain Descriptors / Indicators: Sore Pain Intervention(s): Monitored during session;Repositioned    Home Living                      Prior Function            PT Goals (current goals can now be found in the care plan section) Progress towards PT goals: Progressing toward goals    Frequency    Min 3X/week      PT Plan Current plan remains appropriate    Co-evaluation              AM-PAC PT "6 Clicks" Daily Activity  Outcome Measure  Difficulty turning over in bed (including adjusting bedclothes, sheets and blankets)?: None Difficulty moving from lying on back to sitting on the side of the bed? : None Difficulty sitting down on and standing up from a chair with arms (e.g., wheelchair, bedside commode, etc,.)?: A Little Help needed moving to and from a bed to chair (including a wheelchair)?: A Little Help needed walking in hospital room?: A Little Help needed climbing 3-5 steps with a railing? : A Lot  6 Click Score: 19    End of Session Equipment Utilized During Treatment: Gait belt Activity Tolerance: Patient tolerated treatment well Patient left: in bed;with call bell/phone within reach;with bed alarm set Nurse Communication: Mobility status PT Visit Diagnosis: Unsteadiness on feet (R26.81);Difficulty in walking, not elsewhere classified (R26.2)     Time: 1030-1045 PT Time Calculation (min) (ACUTE ONLY): 15 min  Charges:  $Gait Training: 8-22 mins                    G Codes:      Carmelia Bake, PT, DPT 08/31/2016 Pager: [phone]  York Ram E 08/31/2016, 12:42 PM

## 2016-08-31 NOTE — Care Management Note (Signed)
Case Management Note  Patient Details  Name: Jo [name] MRN: [id number] Date of Birth: 03/29/1917  Subjective/Objective:  [facility] rep aware of d/c & HHC orders. No further CM needs.                  Action/Plan:d/c home w/HHC.   Expected Discharge Date:  08/31/16               Expected Discharge Plan:  [name]  In-House Referral:  Clinical Social Work  Discharge planning Services  CM Consult  Post Acute Care Choice:  Durable Medical Equipment (rw) Choice offered to:  Adult Children  DME Arranged:    DME Agency:     HH Arranged:  RN, PT, OT, Nurse's Aide, Social Work [employer] Agency:  [facility]  Status of Service:  Completed, signed off  If discussed at [employer], dates discussed:    Additional Comments:  Dessa Phi, RN 08/31/2016, 11:38 AM

## 2016-08-31 NOTE — Progress Notes (Signed)
CCMD notified this RN that pt. HR was 110-130's in Afib. Pt. Has pacemaker and pacer spikes not showing. EKG obtained, results showed Afib with RVR with HR of 120's. BP 134/65, HR 122, O2 92 on room air, and pt has no c/o of chest pain. On Call MD [facility] paged and made aware. Awaiting on further orders, will continue to monitor closely.

## 2016-08-31 NOTE — Discharge Summary (Signed)
Physician Discharge Summary  Jo [name] DEY:[id number] DOB: [birth year] DOA: 08/29/2016  PCP: Crist Infante, MD  Admit date: 08/29/2016 Discharge date: 08/31/2016  Admitted From: Home Disposition: Home with home care  Recommendations for Outpatient Follow-up:  1. Follow up with PCP in 1-2 weeks 2. Take Keflex as prescribed for 5 more days for urinary tract infection  Home Health: Yes Equipment/Devices: None Discharge Condition: Stable CODE STATUS: DO NOT RESUSCITATE Diet recommendation: Regular  Brief/Interim Summary: 83 year old female with history of atrial fibrillation on anticoagulation - Eliquis, type 2 diabetes, hyperlipidemia and diastolic CHF team to the ER with complaints of progressive weakness. Upon admission she was noted to have nonfocal weakness, EKG showed nonspecific changes which were chronic and chest x-ray was unremarkable. Her UA showed features consistent with UTI therefore she was started on ceftriaxone. Over the course of 48 hours her urine culture grew Escherichia coli which was sensitive to cefazolin. Her antibiotics were changed to Keflex to complete a seven-day course she was given 5 days of antibiotic. During this time due to signs of dehydration she also received IV fluids.  I spoke with the patient's daughter extensively regarding her being on anticoagulation and increased fall risk. Daughter would like patient to continue anticoagulation and she will bring it up to her primary care physician and the patient's cardiologist at their next appointment to discuss risk and benefit. With the help of physical therapy and case management arrangements were made to get her home health.  Today she was deemed medically stable and has reached maximum benefit from in hospital stay. She does not any complaints and tolerating her diet well. She'll be discharged home.  Discharge Diagnoses:  Active Problems:   PAF (paroxysmal atrial fibrillation) (HCC)   SSS (sick sinus syndrome)  (HCC)   Hypertension   Weakness   Acute lower UTI   UTI (urinary tract infection)  Generalized weakness, improved Urinary tract infection, improving -UAs growing Escherichia coli which is sensitive to cefazolin therefore ceftriaxone is switched to by mouth Keflex. -Patient family decided to take the patient home on oral antibiotics and with home care. -TSH-2.3 -Echocardiogram on 08/06 -showed ejection fraction 65-70 percent with moderate LVH and grade 1 diastolic dysfunction.  Paroxysmal atrial fibrillation, stable -On Eliquis; I have urged to the patient and the family that the risk of anticoagulation should be discussed with her outpatient primary care physician and her cardiologist. -Continue Coreg 6.25 mg twice daily and Cardizem 240 mg daily  Sick sinus syndrome status post pacemaker  Hypertension, stable -Continue Coreg and Cardizem  Hyperlipidemia, stable -onCrestor 2.5 mg  Diabetes type 2, stable -Not her medications.  History of anxiety and depression, stable -On Ativan at home as needed-continue. This can also increase her risk of fall which I have discussed with the patient's daughter -Continue Zoloft 50 mg daily  Discharge Instructions   Allergies as of 08/31/2016      Reactions   Sulfonamide Derivatives Rash   "it was all over my legs"   Citalopram Hydrobromide Other (See Comments)   Pt does not remember reaction      Medication List    TAKE these medications   acetaminophen 500 MG tablet Commonly known as:  TYLENOL Take 500-1,000 mg by mouth every 6 (six) hours as needed for mild pain, moderate pain, fever or headache.   apixaban 2.5 MG Tabs tablet Commonly known as:  ELIQUIS Take 1 tablet (2.5 mg total) by mouth 2 (two) times daily. What changed:  additional instructions  azelastine 0.1 % nasal spray Commonly known as:  ASTELIN Place 1 spray into both nostrils daily as needed for rhinitis. Use in each nostril as directed   carvedilol 6.25  MG tablet Commonly known as:  COREG TAKE 1 TABLET (6.25 MG TOTAL) BY MOUTH 2 (TWO) TIMES DAILY.   cephALEXin 500 MG capsule Commonly known as:  KEFLEX Take 1 capsule (500 mg total) by mouth 4 (four) times daily.   CRESTOR 5 MG tablet Generic drug:  rosuvastatin Take 2.5 mg by mouth every Monday, Wednesday, and Friday.   diltiazem 240 MG 24 hr capsule Commonly known as:  CARDIZEM CD Take 240 mg by mouth daily.   estradiol 0.1 MG/GM vaginal cream Commonly known as:  ESTRACE Place 1 Applicatorful vaginally every Monday, Wednesday, and Friday.   FLAX SEEDS PO Take 2.5 mLs by mouth daily with breakfast.   LORazepam 0.5 MG tablet Commonly known as:  ATIVAN Take 0.25-0.5 mg by mouth 3 (three) times daily. Take 0.25 mg tablet in the morning and afternoon, then 0.5 mg at bedtime   polyethylene glycol packet Commonly known as:  MIRALAX / GLYCOLAX Take 17 g by mouth daily as needed for mild constipation.   sertraline 50 MG tablet Commonly known as:  ZOLOFT Take 50 mg by mouth daily.   Vitamin D (Ergocalciferol) 50000 units Caps capsule Commonly known as:  DRISDOL Take 50,000 Units by mouth every Wednesday.      Follow-up Information    [facility] Follow up.   Why:  [facility] nursing,physical/occupational Warden/ranger information: [place] [phone]          Allergies  Allergen Reactions  . Sulfonamide Derivatives Rash    "it was all over my legs"  . Citalopram Hydrobromide Other (See Comments)    Pt does not remember reaction    On your next visit with your primary care physician please Get Medicines reviewed and adjusted.   Please request your Prim.MD to go over all Hospital Tests and Procedure/Radiological results at the follow up, please get all Hospital records sent to your Prim MD by signing hospital release before you go home.   If you experience worsening of your admission symptoms,  develop shortness of breath, life threatening emergency, suicidal or homicidal thoughts you must seek medical attention immediately by calling 911 or calling your MD immediately  if symptoms less severe.  You Must read complete instructions/literature along with all the possible adverse reactions/side effects for all the Medicines you take and that have been prescribed to you. Take any new Medicines after you have completely understood and accpet all the possible adverse reactions/side effects.   Do not drive, operate heavy machinery, perform activities at heights, swimming or participation in water activities or provide baby sitting services if your were admitted for syncope or siezures until you have seen by Primary MD or a Neurologist and advised to do so again.  Do not drive when taking Pain medications.    Do not take more than prescribed Pain, Sleep and Anxiety Medications  Special Instructions: If you have smoked or chewed Tobacco  in the last 2 yrs please stop smoking, stop any regular Alcohol  and or any Recreational drug use.  Wear Seat belts while driving.   Please note  You were cared for by a hospitalist during your hospital stay. If you have any questions about your discharge medications or the care you received while you were in the hospital  after you are discharged, you can call the unit and asked to speak with the hospitalist on call if the hospitalist that took care of you is not available. Once you are discharged, your primary care physician will handle any further medical issues. Please note that NO REFILLS for any discharge medications will be authorized once you are discharged, as it is imperative that you return to your primary care physician (or establish a relationship with a primary care physician if you do not have one) for your aftercare needs so that they can reassess your need for medications and monitor your lab values.   Increase activity slowly         Consultations:  None   Procedures/Studies: Dg Chest 2 View  Result Date: 08/29/2016 CLINICAL DATA:  Cough and shortness of breath EXAM: CHEST  2 VIEW COMPARISON:  November 17, 2015 FINDINGS: There is no edema or consolidation. Heart is upper normal in size with pulmonary vascular within normal limits. Pacer leads are unchanged in position. There is atherosclerotic calcification in the aorta. There is degenerative change in the thoracic spine. IMPRESSION: Stable cardiac silhouette. Aortic atherosclerosis. No edema or consolidation. Aortic Atherosclerosis (ICD10-I70.0). Electronically Signed   By: Lowella Grip III M.D.   On: 08/29/2016 13:39       Subjective: No complaints. No acute events overnight  Discharge Exam: Vitals:   08/31/16 0432 08/31/16 0533  BP: (!) 146/63 (!) 120/52  Pulse: 83 70  Resp: 18   Temp: 98.8 F (37.1 C)    Vitals:   08/30/16 2029 08/31/16 0353 08/31/16 0432 08/31/16 0533  BP: (!) 169/59 134/65 (!) 146/63 (!) 120/52  Pulse: 70 (!) 122 83 70  Resp: 18 17 18    Temp: 98.2 F (36.8 C) 97.8 F (36.6 C) 98.8 F (37.1 C)   TempSrc: Oral Oral Oral   SpO2: 93% 92% 96%   Weight:   56.9 kg (125 lb 7.1 oz)   Height:        General: Pt is alert, awake, not in acute distress, elderly frail-appearing Cardiovascular: RRR, S1/S2 +, no rubs, no gallops Respiratory: CTA bilaterally, no wheezing, no rhonchi Abdominal: Soft, NT, ND, bowel sounds + Extremities: no edema, no cyanosis    The results of significant diagnostics from this hospitalization (including imaging, microbiology, ancillary and laboratory) are listed below for reference.     Microbiology: Recent Results (from the past 240 hour(s))  Urine culture     Status: Abnormal   Collection Time: 08/29/16  1:00 PM  Result Value Ref Range Status   Specimen Description URINE, CLEAN CATCH  Final   Special Requests NONE  Final   Culture >=100,000 COLONIES/mL ESCHERICHIA COLI (A)  Final    Report Status 08/31/2016 FINAL  Final   Organism ID, Bacteria ESCHERICHIA COLI (A)  Final      Susceptibility   Escherichia coli - MIC*    AMPICILLIN >=32 RESISTANT Resistant     CEFAZOLIN <=4 SENSITIVE Sensitive     CEFTRIAXONE <=1 SENSITIVE Sensitive     CIPROFLOXACIN <=0.25 SENSITIVE Sensitive     GENTAMICIN <=1 SENSITIVE Sensitive     IMIPENEM <=0.25 SENSITIVE Sensitive     NITROFURANTOIN <=16 SENSITIVE Sensitive     TRIMETH/SULFA <=20 SENSITIVE Sensitive     AMPICILLIN/SULBACTAM 16 INTERMEDIATE Intermediate     PIP/TAZO <=4 SENSITIVE Sensitive     Extended ESBL NEGATIVE Sensitive     * >=100,000 COLONIES/mL ESCHERICHIA COLI     Labs: BNP (last 3 results)  No results for input(s): BNP in the last 8760 hours. Basic Metabolic Panel:  Recent Labs Lab 08/29/16 1242 08/30/16 0539  NA 141 142  K 4.1 3.8  CL 104 106  CO2 28 26  GLUCOSE 220* 152*  BUN 15 17  CREATININE 0.64 0.64  CALCIUM 9.5 9.3   Liver Function Tests:  Recent Labs Lab 08/29/16 1242  AST 18  ALT 14  ALKPHOS 69  BILITOT 0.5  PROT 7.4  ALBUMIN 3.9   No results for input(s): LIPASE, AMYLASE in the last 168 hours. No results for input(s): AMMONIA in the last 168 hours. CBC:  Recent Labs Lab 08/29/16 1242 08/30/16 0539  WBC 10.3 10.7*  NEUTROABS 7.5  --   HGB 13.0 12.5  HCT 38.0 37.0  MCV 92.0 91.8  PLT 245 235   Cardiac Enzymes:  Recent Labs Lab 08/29/16 1900 08/29/16 2340 08/30/16 0539  TROPONINI <0.03 <0.03 <0.03   BNP: Invalid input(s): POCBNP CBG:  Recent Labs Lab 08/30/16 0739 08/30/16 1152 08/30/16 1632 08/30/16 2149 08/31/16 0856  GLUCAP 159* 168* 124* 161* 227*   D-Dimer No results for input(s): DDIMER in the last 72 hours. Hgb A1c No results for input(s): HGBA1C in the last 72 hours. Lipid Profile No results for input(s): CHOL, HDL, LDLCALC, TRIG, CHOLHDL, LDLDIRECT in the last 72 hours. Thyroid function studies  Recent Labs  08/29/16 2340  TSH 2.332    Anemia work up No results for input(s): VITAMINB12, FOLATE, FERRITIN, TIBC, IRON, RETICCTPCT in the last 72 hours. Urinalysis    Component Value Date/Time   COLORURINE STRAW (A) 08/29/2016 1300   APPEARANCEUR CLEAR 08/29/2016 1300   LABSPEC 1.003 (L) 08/29/2016 1300   PHURINE 8.0 08/29/2016 1300   GLUCOSEU >=500 (A) 08/29/2016 1300   HGBUR SMALL (A) 08/29/2016 1300   BILIRUBINUR NEGATIVE 08/29/2016 1300   KETONESUR NEGATIVE 08/29/2016 1300   PROTEINUR NEGATIVE 08/29/2016 1300   UROBILINOGEN 1.0 07/06/2012 1130   NITRITE NEGATIVE 08/29/2016 1300   LEUKOCYTESUR TRACE (A) 08/29/2016 1300   Sepsis Labs Invalid input(s): PROCALCITONIN,  WBC,  LACTICIDVEN Microbiology Recent Results (from the past 240 hour(s))  Urine culture     Status: Abnormal   Collection Time: 08/29/16  1:00 PM  Result Value Ref Range Status   Specimen Description URINE, CLEAN CATCH  Final   Special Requests NONE  Final   Culture >=100,000 COLONIES/mL ESCHERICHIA COLI (A)  Final   Report Status 08/31/2016 FINAL  Final   Organism ID, Bacteria ESCHERICHIA COLI (A)  Final      Susceptibility   Escherichia coli - MIC*    AMPICILLIN >=32 RESISTANT Resistant     CEFAZOLIN <=4 SENSITIVE Sensitive     CEFTRIAXONE <=1 SENSITIVE Sensitive     CIPROFLOXACIN <=0.25 SENSITIVE Sensitive     GENTAMICIN <=1 SENSITIVE Sensitive     IMIPENEM <=0.25 SENSITIVE Sensitive     NITROFURANTOIN <=16 SENSITIVE Sensitive     TRIMETH/SULFA <=20 SENSITIVE Sensitive     AMPICILLIN/SULBACTAM 16 INTERMEDIATE Intermediate     PIP/TAZO <=4 SENSITIVE Sensitive     Extended ESBL NEGATIVE Sensitive     * >=100,000 COLONIES/mL ESCHERICHIA COLI     Time coordinating discharge: Over 30 minutes  SIGNED:   Damita Lack, MD  [facility] 08/31/2016, 11:12 AM Pager   If 7PM-7AM, please contact night-coverage [email or web address] Password [email or web address]

## 2016-08-31 NOTE — Progress Notes (Signed)
CCMD notified this RN that pt. Is now in NSR in the 80's. Cardizem ordered by MD held. MD Hal Hope paged and made aware. Will continue to monitor.

## 2016-09-05 DIAGNOSIS — M810 Age-related osteoporosis without current pathological fracture: Secondary | ICD-10-CM | POA: Diagnosis not present

## 2016-09-05 DIAGNOSIS — M1990 Unspecified osteoarthritis, unspecified site: Secondary | ICD-10-CM | POA: Diagnosis not present

## 2016-09-05 DIAGNOSIS — E119 Type 2 diabetes mellitus without complications: Secondary | ICD-10-CM | POA: Diagnosis not present

## 2016-09-05 DIAGNOSIS — Z950 Presence of cardiac pacemaker: Secondary | ICD-10-CM | POA: Diagnosis not present

## 2016-09-05 DIAGNOSIS — Z7901 Long term (current) use of anticoagulants: Secondary | ICD-10-CM | POA: Diagnosis not present

## 2016-09-05 DIAGNOSIS — F329 Major depressive disorder, single episode, unspecified: Secondary | ICD-10-CM | POA: Diagnosis not present

## 2016-09-05 DIAGNOSIS — I4891 Unspecified atrial fibrillation: Secondary | ICD-10-CM | POA: Diagnosis not present

## 2016-09-05 DIAGNOSIS — I110 Hypertensive heart disease with heart failure: Secondary | ICD-10-CM | POA: Diagnosis not present

## 2016-09-05 DIAGNOSIS — I5032 Chronic diastolic (congestive) heart failure: Secondary | ICD-10-CM | POA: Diagnosis not present

## 2016-09-05 DIAGNOSIS — F419 Anxiety disorder, unspecified: Secondary | ICD-10-CM | POA: Diagnosis not present

## 2016-09-05 DIAGNOSIS — I495 Sick sinus syndrome: Secondary | ICD-10-CM | POA: Diagnosis not present

## 2016-09-05 DIAGNOSIS — E785 Hyperlipidemia, unspecified: Secondary | ICD-10-CM | POA: Diagnosis not present

## 2016-09-07 DIAGNOSIS — M1990 Unspecified osteoarthritis, unspecified site: Secondary | ICD-10-CM | POA: Diagnosis not present

## 2016-09-07 DIAGNOSIS — I110 Hypertensive heart disease with heart failure: Secondary | ICD-10-CM | POA: Diagnosis not present

## 2016-09-07 DIAGNOSIS — I5032 Chronic diastolic (congestive) heart failure: Secondary | ICD-10-CM | POA: Diagnosis not present

## 2016-09-07 DIAGNOSIS — I4891 Unspecified atrial fibrillation: Secondary | ICD-10-CM | POA: Diagnosis not present

## 2016-09-07 DIAGNOSIS — E119 Type 2 diabetes mellitus without complications: Secondary | ICD-10-CM | POA: Diagnosis not present

## 2016-09-07 DIAGNOSIS — I495 Sick sinus syndrome: Secondary | ICD-10-CM | POA: Diagnosis not present

## 2016-09-08 DIAGNOSIS — I495 Sick sinus syndrome: Secondary | ICD-10-CM | POA: Diagnosis not present

## 2016-09-08 DIAGNOSIS — M1990 Unspecified osteoarthritis, unspecified site: Secondary | ICD-10-CM | POA: Diagnosis not present

## 2016-09-08 DIAGNOSIS — E119 Type 2 diabetes mellitus without complications: Secondary | ICD-10-CM | POA: Diagnosis not present

## 2016-09-08 DIAGNOSIS — I4891 Unspecified atrial fibrillation: Secondary | ICD-10-CM | POA: Diagnosis not present

## 2016-09-08 DIAGNOSIS — I5032 Chronic diastolic (congestive) heart failure: Secondary | ICD-10-CM | POA: Diagnosis not present

## 2016-09-08 DIAGNOSIS — I110 Hypertensive heart disease with heart failure: Secondary | ICD-10-CM | POA: Diagnosis not present

## 2016-09-09 DIAGNOSIS — N390 Urinary tract infection, site not specified: Secondary | ICD-10-CM | POA: Diagnosis not present

## 2016-09-12 DIAGNOSIS — I4891 Unspecified atrial fibrillation: Secondary | ICD-10-CM | POA: Diagnosis not present

## 2016-09-12 DIAGNOSIS — M1990 Unspecified osteoarthritis, unspecified site: Secondary | ICD-10-CM | POA: Diagnosis not present

## 2016-09-12 DIAGNOSIS — I495 Sick sinus syndrome: Secondary | ICD-10-CM | POA: Diagnosis not present

## 2016-09-12 DIAGNOSIS — I5032 Chronic diastolic (congestive) heart failure: Secondary | ICD-10-CM | POA: Diagnosis not present

## 2016-09-12 DIAGNOSIS — I110 Hypertensive heart disease with heart failure: Secondary | ICD-10-CM | POA: Diagnosis not present

## 2016-09-12 DIAGNOSIS — E119 Type 2 diabetes mellitus without complications: Secondary | ICD-10-CM | POA: Diagnosis not present

## 2016-09-14 DIAGNOSIS — M1990 Unspecified osteoarthritis, unspecified site: Secondary | ICD-10-CM | POA: Diagnosis not present

## 2016-09-14 DIAGNOSIS — E119 Type 2 diabetes mellitus without complications: Secondary | ICD-10-CM | POA: Diagnosis not present

## 2016-09-14 DIAGNOSIS — I5032 Chronic diastolic (congestive) heart failure: Secondary | ICD-10-CM | POA: Diagnosis not present

## 2016-09-14 DIAGNOSIS — I110 Hypertensive heart disease with heart failure: Secondary | ICD-10-CM | POA: Diagnosis not present

## 2016-09-14 DIAGNOSIS — I495 Sick sinus syndrome: Secondary | ICD-10-CM | POA: Diagnosis not present

## 2016-09-14 DIAGNOSIS — I4891 Unspecified atrial fibrillation: Secondary | ICD-10-CM | POA: Diagnosis not present

## 2016-09-14 LAB — CUP PACEART REMOTE DEVICE CHECK
Battery Impedance: 232 Ohm
Battery Remaining Longevity: 116 mo
Brady Statistic AS VS Percent: 2 %
Date Time Interrogation Session: 20180726174202
Implantable Lead Implant Date: 20080221
Implantable Lead Model: 4076
Lead Channel Impedance Value: 414 Ohm
Lead Channel Setting Pacing Amplitude: 2 V
Lead Channel Setting Pacing Amplitude: 2.5 V
Lead Channel Setting Pacing Pulse Width: 0.4 ms
Lead Channel Setting Sensing Sensitivity: 2 mV
MDC IDC LEAD IMPLANT DT: 20080221
MDC IDC LEAD LOCATION: 753859
MDC IDC LEAD LOCATION: 753860
MDC IDC MSMT BATTERY VOLTAGE: 2.8 V
MDC IDC MSMT LEADCHNL RA IMPEDANCE VALUE: 508 Ohm
MDC IDC MSMT LEADCHNL RA PACING THRESHOLD AMPLITUDE: 0.5 V
MDC IDC MSMT LEADCHNL RA PACING THRESHOLD PULSEWIDTH: 0.4 ms
MDC IDC MSMT LEADCHNL RV PACING THRESHOLD AMPLITUDE: 0.875 V
MDC IDC MSMT LEADCHNL RV PACING THRESHOLD PULSEWIDTH: 0.4 ms
MDC IDC PG IMPLANT DT: 20130710
MDC IDC STAT BRADY AP VP PERCENT: 0 %
MDC IDC STAT BRADY AP VS PERCENT: 97 %
MDC IDC STAT BRADY AS VP PERCENT: 0 %

## 2016-09-15 DIAGNOSIS — I5032 Chronic diastolic (congestive) heart failure: Secondary | ICD-10-CM | POA: Diagnosis not present

## 2016-09-15 DIAGNOSIS — I110 Hypertensive heart disease with heart failure: Secondary | ICD-10-CM | POA: Diagnosis not present

## 2016-09-15 DIAGNOSIS — I495 Sick sinus syndrome: Secondary | ICD-10-CM | POA: Diagnosis not present

## 2016-09-15 DIAGNOSIS — I4891 Unspecified atrial fibrillation: Secondary | ICD-10-CM | POA: Diagnosis not present

## 2016-09-15 DIAGNOSIS — M1990 Unspecified osteoarthritis, unspecified site: Secondary | ICD-10-CM | POA: Diagnosis not present

## 2016-09-15 DIAGNOSIS — E119 Type 2 diabetes mellitus without complications: Secondary | ICD-10-CM | POA: Diagnosis not present

## 2016-09-16 DIAGNOSIS — E119 Type 2 diabetes mellitus without complications: Secondary | ICD-10-CM | POA: Diagnosis not present

## 2016-09-16 DIAGNOSIS — N390 Urinary tract infection, site not specified: Secondary | ICD-10-CM | POA: Diagnosis not present

## 2016-09-16 DIAGNOSIS — I5032 Chronic diastolic (congestive) heart failure: Secondary | ICD-10-CM | POA: Diagnosis not present

## 2016-09-16 DIAGNOSIS — I4891 Unspecified atrial fibrillation: Secondary | ICD-10-CM | POA: Diagnosis not present

## 2016-09-16 DIAGNOSIS — M1990 Unspecified osteoarthritis, unspecified site: Secondary | ICD-10-CM | POA: Diagnosis not present

## 2016-09-16 DIAGNOSIS — I110 Hypertensive heart disease with heart failure: Secondary | ICD-10-CM | POA: Diagnosis not present

## 2016-09-16 DIAGNOSIS — I495 Sick sinus syndrome: Secondary | ICD-10-CM | POA: Diagnosis not present

## 2016-09-19 DIAGNOSIS — M1990 Unspecified osteoarthritis, unspecified site: Secondary | ICD-10-CM | POA: Diagnosis not present

## 2016-09-19 DIAGNOSIS — I110 Hypertensive heart disease with heart failure: Secondary | ICD-10-CM | POA: Diagnosis not present

## 2016-09-19 DIAGNOSIS — I495 Sick sinus syndrome: Secondary | ICD-10-CM | POA: Diagnosis not present

## 2016-09-19 DIAGNOSIS — I5032 Chronic diastolic (congestive) heart failure: Secondary | ICD-10-CM | POA: Diagnosis not present

## 2016-09-19 DIAGNOSIS — E119 Type 2 diabetes mellitus without complications: Secondary | ICD-10-CM | POA: Diagnosis not present

## 2016-09-19 DIAGNOSIS — I4891 Unspecified atrial fibrillation: Secondary | ICD-10-CM | POA: Diagnosis not present

## 2016-09-20 DIAGNOSIS — I110 Hypertensive heart disease with heart failure: Secondary | ICD-10-CM | POA: Diagnosis not present

## 2016-09-20 DIAGNOSIS — E119 Type 2 diabetes mellitus without complications: Secondary | ICD-10-CM | POA: Diagnosis not present

## 2016-09-20 DIAGNOSIS — I4891 Unspecified atrial fibrillation: Secondary | ICD-10-CM | POA: Diagnosis not present

## 2016-09-20 DIAGNOSIS — I5032 Chronic diastolic (congestive) heart failure: Secondary | ICD-10-CM | POA: Diagnosis not present

## 2016-09-20 DIAGNOSIS — I495 Sick sinus syndrome: Secondary | ICD-10-CM | POA: Diagnosis not present

## 2016-09-20 DIAGNOSIS — M1990 Unspecified osteoarthritis, unspecified site: Secondary | ICD-10-CM | POA: Diagnosis not present

## 2016-09-21 DIAGNOSIS — I110 Hypertensive heart disease with heart failure: Secondary | ICD-10-CM | POA: Diagnosis not present

## 2016-09-21 DIAGNOSIS — M1990 Unspecified osteoarthritis, unspecified site: Secondary | ICD-10-CM | POA: Diagnosis not present

## 2016-09-21 DIAGNOSIS — I495 Sick sinus syndrome: Secondary | ICD-10-CM | POA: Diagnosis not present

## 2016-09-21 DIAGNOSIS — E119 Type 2 diabetes mellitus without complications: Secondary | ICD-10-CM | POA: Diagnosis not present

## 2016-09-21 DIAGNOSIS — I5032 Chronic diastolic (congestive) heart failure: Secondary | ICD-10-CM | POA: Diagnosis not present

## 2016-09-21 DIAGNOSIS — I4891 Unspecified atrial fibrillation: Secondary | ICD-10-CM | POA: Diagnosis not present

## 2016-09-22 DIAGNOSIS — I110 Hypertensive heart disease with heart failure: Secondary | ICD-10-CM | POA: Diagnosis not present

## 2016-09-22 DIAGNOSIS — I495 Sick sinus syndrome: Secondary | ICD-10-CM | POA: Diagnosis not present

## 2016-09-22 DIAGNOSIS — I4891 Unspecified atrial fibrillation: Secondary | ICD-10-CM | POA: Diagnosis not present

## 2016-09-22 DIAGNOSIS — E119 Type 2 diabetes mellitus without complications: Secondary | ICD-10-CM | POA: Diagnosis not present

## 2016-09-22 DIAGNOSIS — M1990 Unspecified osteoarthritis, unspecified site: Secondary | ICD-10-CM | POA: Diagnosis not present

## 2016-09-22 DIAGNOSIS — I5032 Chronic diastolic (congestive) heart failure: Secondary | ICD-10-CM | POA: Diagnosis not present

## 2016-09-27 DIAGNOSIS — I110 Hypertensive heart disease with heart failure: Secondary | ICD-10-CM | POA: Diagnosis not present

## 2016-09-27 DIAGNOSIS — I495 Sick sinus syndrome: Secondary | ICD-10-CM | POA: Diagnosis not present

## 2016-09-27 DIAGNOSIS — I5032 Chronic diastolic (congestive) heart failure: Secondary | ICD-10-CM | POA: Diagnosis not present

## 2016-09-27 DIAGNOSIS — I4891 Unspecified atrial fibrillation: Secondary | ICD-10-CM | POA: Diagnosis not present

## 2016-09-27 DIAGNOSIS — E119 Type 2 diabetes mellitus without complications: Secondary | ICD-10-CM | POA: Diagnosis not present

## 2016-09-27 DIAGNOSIS — M1990 Unspecified osteoarthritis, unspecified site: Secondary | ICD-10-CM | POA: Diagnosis not present

## 2016-09-28 DIAGNOSIS — I495 Sick sinus syndrome: Secondary | ICD-10-CM | POA: Diagnosis not present

## 2016-09-28 DIAGNOSIS — I4891 Unspecified atrial fibrillation: Secondary | ICD-10-CM | POA: Diagnosis not present

## 2016-09-28 DIAGNOSIS — E119 Type 2 diabetes mellitus without complications: Secondary | ICD-10-CM | POA: Diagnosis not present

## 2016-09-28 DIAGNOSIS — M1990 Unspecified osteoarthritis, unspecified site: Secondary | ICD-10-CM | POA: Diagnosis not present

## 2016-09-28 DIAGNOSIS — I110 Hypertensive heart disease with heart failure: Secondary | ICD-10-CM | POA: Diagnosis not present

## 2016-09-28 DIAGNOSIS — I5032 Chronic diastolic (congestive) heart failure: Secondary | ICD-10-CM | POA: Diagnosis not present

## 2016-09-29 DIAGNOSIS — N390 Urinary tract infection, site not specified: Secondary | ICD-10-CM | POA: Diagnosis not present

## 2016-09-29 DIAGNOSIS — E119 Type 2 diabetes mellitus without complications: Secondary | ICD-10-CM | POA: Diagnosis not present

## 2016-09-29 DIAGNOSIS — I495 Sick sinus syndrome: Secondary | ICD-10-CM | POA: Diagnosis not present

## 2016-09-29 DIAGNOSIS — I4891 Unspecified atrial fibrillation: Secondary | ICD-10-CM | POA: Diagnosis not present

## 2016-09-29 DIAGNOSIS — I110 Hypertensive heart disease with heart failure: Secondary | ICD-10-CM | POA: Diagnosis not present

## 2016-09-29 DIAGNOSIS — I5032 Chronic diastolic (congestive) heart failure: Secondary | ICD-10-CM | POA: Diagnosis not present

## 2016-09-29 DIAGNOSIS — I10 Essential (primary) hypertension: Secondary | ICD-10-CM | POA: Diagnosis not present

## 2016-09-29 DIAGNOSIS — E784 Other hyperlipidemia: Secondary | ICD-10-CM | POA: Diagnosis not present

## 2016-09-29 DIAGNOSIS — I480 Paroxysmal atrial fibrillation: Secondary | ICD-10-CM | POA: Diagnosis not present

## 2016-09-29 DIAGNOSIS — F418 Other specified anxiety disorders: Secondary | ICD-10-CM | POA: Diagnosis not present

## 2016-09-29 DIAGNOSIS — Z6822 Body mass index (BMI) 22.0-22.9, adult: Secondary | ICD-10-CM | POA: Diagnosis not present

## 2016-09-29 DIAGNOSIS — M1990 Unspecified osteoarthritis, unspecified site: Secondary | ICD-10-CM | POA: Diagnosis not present

## 2016-10-04 DIAGNOSIS — I110 Hypertensive heart disease with heart failure: Secondary | ICD-10-CM | POA: Diagnosis not present

## 2016-10-04 DIAGNOSIS — I495 Sick sinus syndrome: Secondary | ICD-10-CM | POA: Diagnosis not present

## 2016-10-04 DIAGNOSIS — I5032 Chronic diastolic (congestive) heart failure: Secondary | ICD-10-CM | POA: Diagnosis not present

## 2016-10-04 DIAGNOSIS — I4891 Unspecified atrial fibrillation: Secondary | ICD-10-CM | POA: Diagnosis not present

## 2016-10-04 DIAGNOSIS — E119 Type 2 diabetes mellitus without complications: Secondary | ICD-10-CM | POA: Diagnosis not present

## 2016-10-04 DIAGNOSIS — M1990 Unspecified osteoarthritis, unspecified site: Secondary | ICD-10-CM | POA: Diagnosis not present

## 2016-10-11 DIAGNOSIS — E119 Type 2 diabetes mellitus without complications: Secondary | ICD-10-CM | POA: Diagnosis not present

## 2016-10-11 DIAGNOSIS — I5032 Chronic diastolic (congestive) heart failure: Secondary | ICD-10-CM | POA: Diagnosis not present

## 2016-10-11 DIAGNOSIS — I495 Sick sinus syndrome: Secondary | ICD-10-CM | POA: Diagnosis not present

## 2016-10-11 DIAGNOSIS — I110 Hypertensive heart disease with heart failure: Secondary | ICD-10-CM | POA: Diagnosis not present

## 2016-10-11 DIAGNOSIS — M1990 Unspecified osteoarthritis, unspecified site: Secondary | ICD-10-CM | POA: Diagnosis not present

## 2016-10-11 DIAGNOSIS — I4891 Unspecified atrial fibrillation: Secondary | ICD-10-CM | POA: Diagnosis not present

## 2016-10-13 DIAGNOSIS — M1990 Unspecified osteoarthritis, unspecified site: Secondary | ICD-10-CM | POA: Diagnosis not present

## 2016-10-13 DIAGNOSIS — I4891 Unspecified atrial fibrillation: Secondary | ICD-10-CM | POA: Diagnosis not present

## 2016-10-13 DIAGNOSIS — I495 Sick sinus syndrome: Secondary | ICD-10-CM | POA: Diagnosis not present

## 2016-10-13 DIAGNOSIS — I5032 Chronic diastolic (congestive) heart failure: Secondary | ICD-10-CM | POA: Diagnosis not present

## 2016-10-13 DIAGNOSIS — I110 Hypertensive heart disease with heart failure: Secondary | ICD-10-CM | POA: Diagnosis not present

## 2016-10-13 DIAGNOSIS — E119 Type 2 diabetes mellitus without complications: Secondary | ICD-10-CM | POA: Diagnosis not present

## 2016-10-18 DIAGNOSIS — M1990 Unspecified osteoarthritis, unspecified site: Secondary | ICD-10-CM | POA: Diagnosis not present

## 2016-10-18 DIAGNOSIS — I5032 Chronic diastolic (congestive) heart failure: Secondary | ICD-10-CM | POA: Diagnosis not present

## 2016-10-18 DIAGNOSIS — I495 Sick sinus syndrome: Secondary | ICD-10-CM | POA: Diagnosis not present

## 2016-10-18 DIAGNOSIS — I4891 Unspecified atrial fibrillation: Secondary | ICD-10-CM | POA: Diagnosis not present

## 2016-10-18 DIAGNOSIS — I110 Hypertensive heart disease with heart failure: Secondary | ICD-10-CM | POA: Diagnosis not present

## 2016-10-18 DIAGNOSIS — E119 Type 2 diabetes mellitus without complications: Secondary | ICD-10-CM | POA: Diagnosis not present

## 2016-10-19 ENCOUNTER — Encounter: Payer: Self-pay | Admitting: Cardiology

## 2016-10-20 DIAGNOSIS — E119 Type 2 diabetes mellitus without complications: Secondary | ICD-10-CM | POA: Diagnosis not present

## 2016-10-20 DIAGNOSIS — I110 Hypertensive heart disease with heart failure: Secondary | ICD-10-CM | POA: Diagnosis not present

## 2016-10-20 DIAGNOSIS — I495 Sick sinus syndrome: Secondary | ICD-10-CM | POA: Diagnosis not present

## 2016-10-20 DIAGNOSIS — I5032 Chronic diastolic (congestive) heart failure: Secondary | ICD-10-CM | POA: Diagnosis not present

## 2016-10-20 DIAGNOSIS — I4891 Unspecified atrial fibrillation: Secondary | ICD-10-CM | POA: Diagnosis not present

## 2016-10-20 DIAGNOSIS — M1990 Unspecified osteoarthritis, unspecified site: Secondary | ICD-10-CM | POA: Diagnosis not present

## 2016-10-25 DIAGNOSIS — M1990 Unspecified osteoarthritis, unspecified site: Secondary | ICD-10-CM | POA: Diagnosis not present

## 2016-10-25 DIAGNOSIS — I495 Sick sinus syndrome: Secondary | ICD-10-CM | POA: Diagnosis not present

## 2016-10-25 DIAGNOSIS — I5032 Chronic diastolic (congestive) heart failure: Secondary | ICD-10-CM | POA: Diagnosis not present

## 2016-10-25 DIAGNOSIS — I110 Hypertensive heart disease with heart failure: Secondary | ICD-10-CM | POA: Diagnosis not present

## 2016-10-25 DIAGNOSIS — I4891 Unspecified atrial fibrillation: Secondary | ICD-10-CM | POA: Diagnosis not present

## 2016-10-25 DIAGNOSIS — E119 Type 2 diabetes mellitus without complications: Secondary | ICD-10-CM | POA: Diagnosis not present

## 2016-10-25 NOTE — Progress Notes (Signed)
Jo [name] Date of Birth: [birth year] Medical Record #[id number]  History of Present Illness: Jo [name] is seen back today for follow up Afib. She has a history of AI, depression, DM, HTN and PAF with a pacemaker in place. Had been on chronic Flecainide and Eliquis. Flecainide discontinued in April 2017 due to visual disturbance and weakness. Last generator change back in 2013. Pacer check in July 2018 showed 20 AMS <1%. . She was admitted in early June 2017 with CAP. She was in Rehab for a month afterwards. Echo at that time showed normal LV function and only mild AI.   She was readmitted in August with generalized weakness, dehydration, and a UTI. Echo repeated and was stable.   On follow up she is doing well.   Denies any chest pain or SOB. No palpitations. She is walking with a walker and is seen with her daughter today. She had a pacemaker check yesterday which showed Afib burden of 1%. No bleeding problems on Eliquis.  Current Outpatient Prescriptions  Medication Sig Dispense Refill  . acetaminophen (TYLENOL) 500 MG tablet Take 500-1,000 mg by mouth every 6 (six) hours as needed for mild pain, moderate pain, fever or headache.    Marland Kitchen apixaban (ELIQUIS) 2.5 MG TABS tablet Take 1 tablet (2.5 mg total) by mouth 2 (two) times daily. 60 tablet 11  . azelastine (ASTELIN) 0.1 % nasal spray Place 1 spray into both nostrils daily as needed for rhinitis. Use in each nostril as directed    . carvedilol (COREG) 6.25 MG tablet TAKE 1 TABLET (6.25 MG TOTAL) BY MOUTH 2 (TWO) TIMES DAILY. 180 tablet 3  . CRESTOR 5 MG tablet Take 2.5 mg by mouth every Monday, Wednesday, and Friday.   7  . diltiazem (CARDIZEM CD) 240 MG 24 hr capsule Take 240 mg by mouth daily.     Marland Kitchen estradiol (ESTRACE) 0.1 MG/GM vaginal cream Place 1 Applicatorful vaginally every Monday, Wednesday, and Friday.    . Flaxseed, Linseed, (FLAX SEEDS PO) Take 2.5 mLs by mouth daily with breakfast.    . LORazepam (ATIVAN) 0.5 MG tablet Take  0.25-0.5 mg by mouth 3 (three) times daily. Take 0.25 mg tablet in the morning and afternoon, then 0.5 mg at bedtime    . polyethylene glycol (MIRALAX / GLYCOLAX) packet Take 17 g by mouth daily as needed for mild constipation.     . sertraline (ZOLOFT) 50 MG tablet Take 50 mg by mouth daily.  3  . Vitamin D, Ergocalciferol, (DRISDOL) 50000 UNITS CAPS capsule Take 50,000 Units by mouth every Wednesday.     No current facility-administered medications for this visit.     Allergies  Allergen Reactions  . Sulfonamide Derivatives Rash    "it was all over my legs"  . Citalopram Hydrobromide Other (See Comments)    Pt does not remember reaction    Past Medical History:  Diagnosis Date  . Anxiety   . Arthritis    "hands; between shoulders; back; feet"  . Chronic anticoagulation    -->Eliquis  . Chronic diastolic CHF (congestive heart failure) ([place])    a. 06/2011 Echo: EF 60-65%.  . Depression   . Essential hypertension   . High risk medication use    on Flecainide  . Kidney stone    passed in [place] 2014  . Moderate aortic insufficiency    a. 06/2011 Echo: EF 60-65%, mild LVH, no rwma, mod AI.  Marland Kitchen Osteoporosis   . Osteoporosis   .  PAF (paroxysmal atrial fibrillation) (HCC)    a. chronic flecainide->reduced to 25 bid 08/2014.  Marland Kitchen Panic attacks    "since losing husband 1994"  . Skin cancer ?1960's   "between breasts"  . SSS (sick sinus syndrome) (H[facility])    a. s/p pacemaker. b. s/p Medtronic gen change 07/2011; c.   . Type II diabetes mellitus ([place])    "controlled w/diet"    Past Surgical History:  Procedure Laterality Date  . APPENDECTOMY  1955  . BREAST CYST EXCISION  ~ 1950   right  . BREAST LUMPECTOMY WITH NEEDLE LOCALIZATION Bilateral 02/03/2014   Procedure: BILATERAL BREAST LUMPECTOMY WITH NEEDLE LOCALIZATION ON LEFT;  Surgeon: Excell Seltzer, MD;  Location: [place];  Service: General;  Laterality: Bilateral;  . CARDIOVASCULAR STRESS TEST  12/09/2004  . CATARACT EXTRACTION W/  INTRAOCULAR LENS  IMPLANT, BILATERAL  1990's  . [place]  . DILATION AND CURETTAGE OF UTERUS  1953  . INSERT / REPLACE / REMOVE PACEMAKER  03/16/2006  . PERMANENT PACEMAKER GENERATOR CHANGE  08/03/2011   Procedure: PERMANENT PACEMAKER GENERATOR CHANGE;  Surgeon: Thompson Grayer, MD;  Location: [facility] CATH LAB;  Service: Cardiovascular;;  . SKIN CANCER EXCISION  ?1960's   "between my breasts"  . TONSILLECTOMY     "school age"    History  Smoking Status  . Never Smoker  Smokeless Tobacco  . Never Used    History  Alcohol Use No    Family History  Problem Relation Age of Onset  . Emphysema Father     Review of Systems: The review of systems is per the HPI.  All other systems were reviewed and are negative.  Physical Exam: BP (!) 120/54   Pulse 76   Ht 5\' 2"  (1.575 m)   Wt 128 lb 3.2 oz (58.2 kg)   BMI 23.45 kg/m  GENERAL:  Elderly WF in NAD. HEENT:  PERRL, EOMI, sclera are clear. Oropharynx is clear. NECK:  No jugular venous distention, carotid upstroke brisk and symmetric, no bruits, no thyromegaly or adenopathy LUNGS:  Clear to auscultation bilaterally CHEST:  Unremarkable HEART:  RRR,  PMI not displaced or sustained,S1 and S2 within normal limits, no S3, no S4: no clicks, no rubs, no murmurs ABD:  Soft, nontender. BS +, no masses or bruits. No hepatomegaly, no splenomegaly EXT:  2 + pulses throughout, no edema, no cyanosis no clubbing SKIN:  Warm and dry.  No rashes NEURO:  Alert and oriented x 3. Cranial nerves II through XII intact. PSYCH:  Cognitively intact   LABORATORY DATA:  Lab Results  Component Value Date   WBC 10.7 (H) 08/30/2016   HGB 12.5 08/30/2016   HCT 37.0 08/30/2016   PLT 235 08/30/2016   GLUCOSE 152 (H) 08/30/2016   ALT 14 08/29/2016   AST 18 08/29/2016   NA 142 08/30/2016   K 3.8 08/30/2016   CL 106 08/30/2016   CREATININE 0.64 08/30/2016   BUN 17 08/30/2016   CO2 26 08/30/2016   TSH 2.332 08/29/2016   INR 1.41 07/06/2012    HGBA1C 7.6 (H) 06/28/2015   Labs dated 05/27/15: cholesterol 160, triglycerides 57, HDL 44, LDL 105. A1c 03/09/16: 6.9%.  Dated 06/21/16: cholesterol 158, triglycerides 52, HDL 46, LDL 102. A1c 7.2%.  Echo 08/30/16: Study Conclusions  - Left ventricle: The cavity size was normal. Wall thickness was   increased in a pattern of moderate LVH. There was severe   assymetric focal basal hypertrophy of the septum. Systolic  function was vigorous. The estimated ejection fraction was in the   range of 65% to 70%. Wall motion was normal; there were no   regional wall motion abnormalities. Doppler parameters are   consistent with abnormal left ventricular relaxation (grade 1   diastolic dysfunction). The E/e&' ratio is >15, suggesting   elevated LV filling pressure. - Aortic valve: Sclerosis without stenosis. There was mild   regurgitation. - Mitral valve: Mildly thickened leaflets . There was trivial   regurgitation. - Left atrium: The atrium was normal in size. - Tricuspid valve: There was trivial regurgitation. - Pulmonary arteries: PA peak pressure: 30 mm Hg (S). - Inferior vena cava: The vessel was normal in size. The   respirophasic diameter changes were in the normal range (>= 50%),   consistent with normal central venous pressure.  Impressions:  - LVEF 65-70%, moderate LVH with severe assymetric proximal septal   thickening (no LVOT obstruction at rest), normal wall motion,   grade 1 DD with elevated LV filling pressure, aortic valve   sclerosis with mild AI, trivial MR, normal LA Size, trivial TR,   RVSP 30 mmHg, normal IVC.  Assessment / Plan:  1. PAF - Remains on  Eliquis and beta blocker. She is maintaining sinus rhythm well. 1% burden by pacer check and she is asymptomatic.   2. HTN - BP under excellent control. Continue current therapy  3. Chronic Diastolic HF - well compensated today. No edema and weight is stable.    I will follow up in 6 months.

## 2016-10-26 ENCOUNTER — Encounter: Payer: Self-pay | Admitting: Internal Medicine

## 2016-10-26 ENCOUNTER — Ambulatory Visit (INDEPENDENT_AMBULATORY_CARE_PROVIDER_SITE_OTHER): Payer: Medicare Other | Admitting: Internal Medicine

## 2016-10-26 ENCOUNTER — Encounter (INDEPENDENT_AMBULATORY_CARE_PROVIDER_SITE_OTHER): Payer: Self-pay

## 2016-10-26 VITALS — BP 122/50 | HR 81 | Ht 63.0 in | Wt 128.0 lb

## 2016-10-26 DIAGNOSIS — I480 Paroxysmal atrial fibrillation: Secondary | ICD-10-CM

## 2016-10-26 DIAGNOSIS — I495 Sick sinus syndrome: Secondary | ICD-10-CM | POA: Diagnosis not present

## 2016-10-26 DIAGNOSIS — I10 Essential (primary) hypertension: Secondary | ICD-10-CM | POA: Diagnosis not present

## 2016-10-26 LAB — CUP PACEART INCLINIC DEVICE CHECK
Battery Remaining Longevity: 112 mo
Brady Statistic AP VS Percent: 95 %
Brady Statistic AS VP Percent: 0 %
Brady Statistic AS VS Percent: 5 %
Date Time Interrogation Session: 20181003144216
Implantable Lead Implant Date: 20080221
Implantable Lead Model: 4076
Implantable Pulse Generator Implant Date: 20130710
Lead Channel Pacing Threshold Amplitude: 1 V
Lead Channel Pacing Threshold Pulse Width: 0.4 ms
Lead Channel Sensing Intrinsic Amplitude: 5.6 mV
Lead Channel Setting Pacing Amplitude: 2 V
Lead Channel Setting Pacing Amplitude: 2.5 V
Lead Channel Setting Pacing Pulse Width: 0.4 ms
Lead Channel Setting Sensing Sensitivity: 2 mV
MDC IDC LEAD IMPLANT DT: 20080221
MDC IDC LEAD LOCATION: 753859
MDC IDC LEAD LOCATION: 753860
MDC IDC MSMT BATTERY IMPEDANCE: 256 Ohm
MDC IDC MSMT BATTERY VOLTAGE: 2.8 V
MDC IDC MSMT LEADCHNL RA IMPEDANCE VALUE: 486 Ohm
MDC IDC MSMT LEADCHNL RA PACING THRESHOLD AMPLITUDE: 0.5 V
MDC IDC MSMT LEADCHNL RA SENSING INTR AMPL: 4 mV
MDC IDC MSMT LEADCHNL RV IMPEDANCE VALUE: 409 Ohm
MDC IDC MSMT LEADCHNL RV PACING THRESHOLD PULSEWIDTH: 0.4 ms
MDC IDC STAT BRADY AP VP PERCENT: 0 %

## 2016-10-26 NOTE — Patient Instructions (Signed)
Medication Instructions:  Your physician recommends that you continue on your current medications as directed. Please refer to the Current Medication list given to you today  Labwork: None ordered   Testing/Procedures: None ordered   Follow-Up: Remote monitoring is used to monitor your Pacemaker from home. This monitoring reduces the number of office visits required to check your device to one time per year. It allows [place] to keep an eye on the functioning of your device to ensure it is working properly. You are scheduled for a device check from home on 11/17/2016. You [place] send your transmission at any time that day. If you have a wireless device, the transmission will be sent automatically. After your physician reviews your transmission, you will receive a postcard with your next transmission date.  Your physician wants you to follow-up in: 12 months with Chanetta Marshall NP. You will receive a reminder letter in the mail two months in advance. If you don't receive a letter, please call our office to schedule the follow-up appointment.   Any Other Special Instructions Will Be Listed Below (If Applicable).     If you need a refill on your cardiac medications before your next appointment, please call your pharmacy.

## 2016-10-26 NOTE — Progress Notes (Signed)
PCP: Crist Infante, MD Primary Cardiologist:  Dr Martinique Primary EP:  Dr Phillis Knack Jo [name] is a 83 y.o. [name] who presents today for routine electrophysiology followup.  Since last being seen in our clinic, the patient reports doing very well.  Today, she denies symptoms of palpitations, chest pain, shortness of breath,  lower extremity edema, dizziness, presyncope, or syncope.  The patient is otherwise without complaint today.   Past Medical History:  Diagnosis Date  . Anxiety   . Arthritis    "hands; between shoulders; back; feet"  . Chronic anticoagulation    -->Eliquis  . Chronic diastolic CHF (congestive heart failure) ([place])    a. 06/2011 Echo: EF 60-65%.  . Depression   . Essential hypertension   . High risk medication use    on Flecainide  . Kidney stone    passed in [place] 2014  . Moderate aortic insufficiency    a. 06/2011 Echo: EF 60-65%, mild LVH, no rwma, mod AI.  Marland Kitchen Osteoporosis   . Osteoporosis   . PAF (paroxysmal atrial fibrillation) (HCC)    a. chronic flecainide->reduced to 25 bid 08/2014.  Marland Kitchen Panic attacks    "since losing husband 1994"  . Skin cancer ?1960's   "between breasts"  . SSS (sick sinus syndrome) (HCC)    a. s/p pacemaker. b. s/p Medtronic gen change 07/2011; c.   . Type II diabetes mellitus ([place])    "controlled w/diet"   Past Surgical History:  Procedure Laterality Date  . APPENDECTOMY  1955  . BREAST CYST EXCISION  ~ 1950   right  . BREAST LUMPECTOMY WITH NEEDLE LOCALIZATION Bilateral 02/03/2014   Procedure: BILATERAL BREAST LUMPECTOMY WITH NEEDLE LOCALIZATION ON LEFT;  Surgeon: Excell Seltzer, MD;  Location: [place];  Service: General;  Laterality: Bilateral;  . CARDIOVASCULAR STRESS TEST  12/09/2004  . CATARACT EXTRACTION W/ INTRAOCULAR LENS  IMPLANT, BILATERAL  1990's  . [place]  . DILATION AND CURETTAGE OF UTERUS  1953  . INSERT / REPLACE / REMOVE PACEMAKER  03/16/2006  . PERMANENT PACEMAKER GENERATOR CHANGE  08/03/2011   Procedure: PERMANENT PACEMAKER GENERATOR CHANGE;  Surgeon: Thompson Grayer, MD;  Location: [facility] CATH LAB;  Service: Cardiovascular;;  . SKIN CANCER EXCISION  ?1960's   "between my breasts"  . TONSILLECTOMY     "school age"    ROS- all systems are reviewed and negative except as per HPI above  Current Outpatient Prescriptions  Medication Sig Dispense Refill  . acetaminophen (TYLENOL) 500 MG tablet Take 500-1,000 mg by mouth every 6 (six) hours as needed for mild pain, moderate pain, fever or headache.    Marland Kitchen apixaban (ELIQUIS) 2.5 MG TABS tablet Take 1 tablet (2.5 mg total) by mouth 2 (two) times daily. 60 tablet 11  . azelastine (ASTELIN) 0.1 % nasal spray Place 1 spray into both nostrils daily as needed for rhinitis. Use in each nostril as directed    . carvedilol (COREG) 6.25 MG tablet TAKE 1 TABLET (6.25 MG TOTAL) BY MOUTH 2 (TWO) TIMES DAILY. 180 tablet 3  . CRESTOR 5 MG tablet Take 2.5 mg by mouth every Monday, Wednesday, and Friday.   7  . diltiazem (CARDIZEM CD) 240 MG 24 hr capsule Take 240 mg by mouth daily.     Marland Kitchen estradiol (ESTRACE) 0.1 MG/GM vaginal cream Place 1 Applicatorful vaginally every Monday, Wednesday, and Friday.    . Flaxseed, Linseed, (FLAX SEEDS PO) Take 2.5 mLs by mouth daily with breakfast.    .  LORazepam (ATIVAN) 0.5 MG tablet Take 0.25-0.5 mg by mouth 3 (three) times daily. Take 0.25 mg tablet in the morning and afternoon, then 0.5 mg at bedtime    . polyethylene glycol (MIRALAX / GLYCOLAX) packet Take 17 g by mouth daily as needed for mild constipation.     . sertraline (ZOLOFT) 50 MG tablet Take 50 mg by mouth daily.  3  . Vitamin D, Ergocalciferol, (DRISDOL) 50000 UNITS CAPS capsule Take 50,000 Units by mouth every Wednesday.     No current facility-administered medications for this visit.     Physical Exam: Vitals:   10/26/16 1401  BP: (!) 122/50  Pulse: 81  SpO2: 94%  Weight: 128 lb (58.1 kg)  Height: 5\' 3"  (1.6 m)    GEN- The patient is well appearing,  alert and oriented x 3 today.   Head- normocephalic, atraumatic Eyes-  Sclera clear, conjunctiva pink Ears- hearing intact Oropharynx- clear Lungs- Clear to ausculation bilaterally, normal work of breathing Chest- pacemaker pocket is well healed Heart- Regular rate and rhythm, no murmurs, rubs or gallops, PMI not laterally displaced GI- soft, NT, ND, + BS Extremities- no clubbing, cyanosis, or edema  Pacemaker interrogation- reviewed in detail today,  See PACEART report   Assessment and Plan:  1. Symptomatic sinus bradycardia/ sick sinus syndrome Normal pacemaker function See Pace Art report No changes today  2. Paroxysmal atrial fibrillation Overall burden is low (1%).  Longest episode by PPM in past ear was 2 hours and 20 minutes on 10/20/16 Continue eliquis  3. HTN Stable No change required today  Follow-up with Dr Martinique as scheduled [facility] Return to see EP NP in a year  Thompson Grayer MD, [facility] 10/26/2016 2:20 PM

## 2016-10-27 ENCOUNTER — Encounter: Payer: Self-pay | Admitting: Cardiology

## 2016-10-27 ENCOUNTER — Ambulatory Visit (INDEPENDENT_AMBULATORY_CARE_PROVIDER_SITE_OTHER): Payer: Medicare Other | Admitting: Cardiology

## 2016-10-27 VITALS — BP 120/54 | HR 76 | Ht 62.0 in | Wt 128.2 lb

## 2016-10-27 DIAGNOSIS — I495 Sick sinus syndrome: Secondary | ICD-10-CM

## 2016-10-27 DIAGNOSIS — I480 Paroxysmal atrial fibrillation: Secondary | ICD-10-CM | POA: Diagnosis not present

## 2016-10-27 DIAGNOSIS — I10 Essential (primary) hypertension: Secondary | ICD-10-CM

## 2016-10-27 NOTE — Patient Instructions (Signed)
Continue your current therapy  I will see you in 6 months.   

## 2016-10-31 DIAGNOSIS — I110 Hypertensive heart disease with heart failure: Secondary | ICD-10-CM | POA: Diagnosis not present

## 2016-10-31 DIAGNOSIS — I495 Sick sinus syndrome: Secondary | ICD-10-CM | POA: Diagnosis not present

## 2016-10-31 DIAGNOSIS — M1990 Unspecified osteoarthritis, unspecified site: Secondary | ICD-10-CM | POA: Diagnosis not present

## 2016-10-31 DIAGNOSIS — I5032 Chronic diastolic (congestive) heart failure: Secondary | ICD-10-CM | POA: Diagnosis not present

## 2016-10-31 DIAGNOSIS — E119 Type 2 diabetes mellitus without complications: Secondary | ICD-10-CM | POA: Diagnosis not present

## 2016-10-31 DIAGNOSIS — I4891 Unspecified atrial fibrillation: Secondary | ICD-10-CM | POA: Diagnosis not present

## 2016-11-02 DIAGNOSIS — H3530 Unspecified macular degeneration: Secondary | ICD-10-CM | POA: Diagnosis not present

## 2016-11-02 DIAGNOSIS — I10 Essential (primary) hypertension: Secondary | ICD-10-CM | POA: Diagnosis not present

## 2016-11-02 DIAGNOSIS — E119 Type 2 diabetes mellitus without complications: Secondary | ICD-10-CM | POA: Diagnosis not present

## 2016-11-02 DIAGNOSIS — N390 Urinary tract infection, site not specified: Secondary | ICD-10-CM | POA: Diagnosis not present

## 2016-11-02 DIAGNOSIS — Z23 Encounter for immunization: Secondary | ICD-10-CM | POA: Diagnosis not present

## 2016-11-02 DIAGNOSIS — I480 Paroxysmal atrial fibrillation: Secondary | ICD-10-CM | POA: Diagnosis not present

## 2016-11-02 DIAGNOSIS — Z6823 Body mass index (BMI) 23.0-23.9, adult: Secondary | ICD-10-CM | POA: Diagnosis not present

## 2016-11-02 DIAGNOSIS — Z950 Presence of cardiac pacemaker: Secondary | ICD-10-CM | POA: Diagnosis not present

## 2016-11-02 DIAGNOSIS — M810 Age-related osteoporosis without current pathological fracture: Secondary | ICD-10-CM | POA: Diagnosis not present

## 2016-11-03 DIAGNOSIS — I110 Hypertensive heart disease with heart failure: Secondary | ICD-10-CM | POA: Diagnosis not present

## 2016-11-03 DIAGNOSIS — I495 Sick sinus syndrome: Secondary | ICD-10-CM | POA: Diagnosis not present

## 2016-11-03 DIAGNOSIS — M1990 Unspecified osteoarthritis, unspecified site: Secondary | ICD-10-CM | POA: Diagnosis not present

## 2016-11-03 DIAGNOSIS — I4891 Unspecified atrial fibrillation: Secondary | ICD-10-CM | POA: Diagnosis not present

## 2016-11-03 DIAGNOSIS — I5032 Chronic diastolic (congestive) heart failure: Secondary | ICD-10-CM | POA: Diagnosis not present

## 2016-11-03 DIAGNOSIS — E119 Type 2 diabetes mellitus without complications: Secondary | ICD-10-CM | POA: Diagnosis not present

## 2016-11-04 DIAGNOSIS — M810 Age-related osteoporosis without current pathological fracture: Secondary | ICD-10-CM | POA: Diagnosis not present

## 2016-11-04 DIAGNOSIS — Z950 Presence of cardiac pacemaker: Secondary | ICD-10-CM | POA: Diagnosis not present

## 2016-11-04 DIAGNOSIS — I495 Sick sinus syndrome: Secondary | ICD-10-CM | POA: Diagnosis not present

## 2016-11-04 DIAGNOSIS — F329 Major depressive disorder, single episode, unspecified: Secondary | ICD-10-CM | POA: Diagnosis not present

## 2016-11-04 DIAGNOSIS — I5032 Chronic diastolic (congestive) heart failure: Secondary | ICD-10-CM | POA: Diagnosis not present

## 2016-11-04 DIAGNOSIS — I4891 Unspecified atrial fibrillation: Secondary | ICD-10-CM | POA: Diagnosis not present

## 2016-11-04 DIAGNOSIS — E785 Hyperlipidemia, unspecified: Secondary | ICD-10-CM | POA: Diagnosis not present

## 2016-11-04 DIAGNOSIS — E119 Type 2 diabetes mellitus without complications: Secondary | ICD-10-CM | POA: Diagnosis not present

## 2016-11-04 DIAGNOSIS — Z87440 Personal history of urinary (tract) infections: Secondary | ICD-10-CM | POA: Diagnosis not present

## 2016-11-04 DIAGNOSIS — I110 Hypertensive heart disease with heart failure: Secondary | ICD-10-CM | POA: Diagnosis not present

## 2016-11-04 DIAGNOSIS — Z7901 Long term (current) use of anticoagulants: Secondary | ICD-10-CM | POA: Diagnosis not present

## 2016-11-04 DIAGNOSIS — M1990 Unspecified osteoarthritis, unspecified site: Secondary | ICD-10-CM | POA: Diagnosis not present

## 2016-11-04 DIAGNOSIS — F419 Anxiety disorder, unspecified: Secondary | ICD-10-CM | POA: Diagnosis not present

## 2016-11-09 DIAGNOSIS — I110 Hypertensive heart disease with heart failure: Secondary | ICD-10-CM | POA: Diagnosis not present

## 2016-11-09 DIAGNOSIS — E119 Type 2 diabetes mellitus without complications: Secondary | ICD-10-CM | POA: Diagnosis not present

## 2016-11-09 DIAGNOSIS — M1990 Unspecified osteoarthritis, unspecified site: Secondary | ICD-10-CM | POA: Diagnosis not present

## 2016-11-09 DIAGNOSIS — I4891 Unspecified atrial fibrillation: Secondary | ICD-10-CM | POA: Diagnosis not present

## 2016-11-09 DIAGNOSIS — I495 Sick sinus syndrome: Secondary | ICD-10-CM | POA: Diagnosis not present

## 2016-11-09 DIAGNOSIS — I5032 Chronic diastolic (congestive) heart failure: Secondary | ICD-10-CM | POA: Diagnosis not present

## 2016-11-17 ENCOUNTER — Telehealth: Payer: Self-pay | Admitting: Cardiology

## 2016-11-17 ENCOUNTER — Ambulatory Visit (INDEPENDENT_AMBULATORY_CARE_PROVIDER_SITE_OTHER): Payer: Medicare Other | Admitting: *Deleted

## 2016-11-17 DIAGNOSIS — I495 Sick sinus syndrome: Secondary | ICD-10-CM

## 2016-11-17 NOTE — Telephone Encounter (Signed)
LMOVM reminding pt to send remote transmission.   

## 2016-11-17 NOTE — Progress Notes (Signed)
Remote pacemaker transmission.   

## 2016-11-18 ENCOUNTER — Encounter: Payer: Self-pay | Admitting: Cardiology

## 2016-11-18 LAB — CUP PACEART REMOTE DEVICE CHECK
Battery Impedance: 256 Ohm
Brady Statistic AS VS Percent: 9 %
Date Time Interrogation Session: 20181025162905
Implantable Lead Implant Date: 20080221
Implantable Lead Model: 4076
Lead Channel Impedance Value: 445 Ohm
Lead Channel Pacing Threshold Pulse Width: 0.4 ms
Lead Channel Setting Pacing Amplitude: 2.5 V
Lead Channel Setting Pacing Pulse Width: 0.4 ms
Lead Channel Setting Sensing Sensitivity: 2 mV
MDC IDC LEAD IMPLANT DT: 20080221
MDC IDC LEAD LOCATION: 753859
MDC IDC LEAD LOCATION: 753860
MDC IDC MSMT BATTERY REMAINING LONGEVITY: 113 mo
MDC IDC MSMT BATTERY VOLTAGE: 2.79 V
MDC IDC MSMT LEADCHNL RA IMPEDANCE VALUE: 515 Ohm
MDC IDC MSMT LEADCHNL RA PACING THRESHOLD AMPLITUDE: 0.5 V
MDC IDC MSMT LEADCHNL RA PACING THRESHOLD PULSEWIDTH: 0.4 ms
MDC IDC MSMT LEADCHNL RV PACING THRESHOLD AMPLITUDE: 0.875 V
MDC IDC PG IMPLANT DT: 20130710
MDC IDC SET LEADCHNL RA PACING AMPLITUDE: 2 V
MDC IDC STAT BRADY AP VP PERCENT: 1 %
MDC IDC STAT BRADY AP VS PERCENT: 90 %
MDC IDC STAT BRADY AS VP PERCENT: 0 %

## 2016-11-22 ENCOUNTER — Telehealth: Payer: Self-pay | Admitting: Internal Medicine

## 2016-11-22 DIAGNOSIS — I110 Hypertensive heart disease with heart failure: Secondary | ICD-10-CM | POA: Diagnosis not present

## 2016-11-22 DIAGNOSIS — I4891 Unspecified atrial fibrillation: Secondary | ICD-10-CM | POA: Diagnosis not present

## 2016-11-22 DIAGNOSIS — M1990 Unspecified osteoarthritis, unspecified site: Secondary | ICD-10-CM | POA: Diagnosis not present

## 2016-11-22 DIAGNOSIS — E119 Type 2 diabetes mellitus without complications: Secondary | ICD-10-CM | POA: Diagnosis not present

## 2016-11-22 DIAGNOSIS — I5032 Chronic diastolic (congestive) heart failure: Secondary | ICD-10-CM | POA: Diagnosis not present

## 2016-11-22 DIAGNOSIS — I495 Sick sinus syndrome: Secondary | ICD-10-CM | POA: Diagnosis not present

## 2016-11-22 NOTE — Telephone Encounter (Signed)
Returned call to [employer] with [facility] patient is experiencing increased weakness, fatigue, and palpitations in the last couple of days.  Jo [name] reports her pulse feels a little more irregular than usual.  States patient reports some increased SOB with exertion yesterday, but no SOB today.  Denies CP, lightheadedness.   Does not take her BP and HR at home but when nurse has checked it has been normal.  Nurse states that patient does have anxiety as well but wanted to report in case.   Patient also would like to know the results of her pacer check (message has been sent to device clinic requesting someone to call to discuss).  Also advised I would call daughter to get patient appointment with APP.  Jo [name] verbalized understanding.

## 2016-11-22 NOTE — Telephone Encounter (Signed)
Patient is scheduled to see Roderic Palau NP in the Afib Clinic 11/1 at 2:30 pm. Daughter is aware and verbalized understanding.

## 2016-11-22 NOTE — Telephone Encounter (Signed)
Spoke with pt's daughter and informed her that the pt has had an increase in atrial fibrillation and this could be the cause of SOB, informed pts daughter that someone would be call ing to schedule the pt an appointment at the atrial fib clinic. Number to atrial fib clinic and code to parking deck provided. Pt;s daughter voiced understanding.

## 2016-11-22 NOTE — Telephone Encounter (Signed)
New Message    Ann Lions RN with [facility] called regarding patient having some issues. Stated patient is also wanting results from pacer check appt  Patient c/o Palpitations:  High priority if patient c/o lightheadedness, shortness of breath, or chest pain  1) How long have you had palpitations/irregular HR/ Afib? Are you having the symptoms now? Yesterday  2) Are you currently experiencing lightheadedness, SOB or CP? No  3) Do you have a history of afib (atrial fibrillation) or irregular heart rhythm? Yes  4) Have you checked your BP or HR? (document readings if available): 140/70 HR 80  5) Are you experiencing any other symptoms? No

## 2016-11-24 ENCOUNTER — Ambulatory Visit (HOSPITAL_COMMUNITY)
Admission: RE | Admit: 2016-11-24 | Discharge: 2016-11-24 | Disposition: A | Discharge status: Home / Self Care | Payer: Medicare Other | Source: Ambulatory Visit | Attending: Nurse Practitioner | Admitting: Nurse Practitioner

## 2016-11-24 VITALS — BP 128/52 | HR 89 | Ht 62.0 in | Wt 129.4 lb

## 2016-11-24 DIAGNOSIS — Z85828 Personal history of other malignant neoplasm of skin: Secondary | ICD-10-CM | POA: Diagnosis not present

## 2016-11-24 DIAGNOSIS — M1990 Unspecified osteoarthritis, unspecified site: Secondary | ICD-10-CM | POA: Diagnosis not present

## 2016-11-24 DIAGNOSIS — I110 Hypertensive heart disease with heart failure: Secondary | ICD-10-CM | POA: Diagnosis not present

## 2016-11-24 DIAGNOSIS — F329 Major depressive disorder, single episode, unspecified: Secondary | ICD-10-CM | POA: Insufficient documentation

## 2016-11-24 DIAGNOSIS — I5032 Chronic diastolic (congestive) heart failure: Secondary | ICD-10-CM | POA: Diagnosis not present

## 2016-11-24 DIAGNOSIS — F419 Anxiety disorder, unspecified: Secondary | ICD-10-CM | POA: Diagnosis not present

## 2016-11-24 DIAGNOSIS — E119 Type 2 diabetes mellitus without complications: Secondary | ICD-10-CM | POA: Diagnosis not present

## 2016-11-24 DIAGNOSIS — Z950 Presence of cardiac pacemaker: Secondary | ICD-10-CM | POA: Insufficient documentation

## 2016-11-24 DIAGNOSIS — I480 Paroxysmal atrial fibrillation: Secondary | ICD-10-CM | POA: Insufficient documentation

## 2016-11-24 DIAGNOSIS — M810 Age-related osteoporosis without current pathological fracture: Secondary | ICD-10-CM | POA: Diagnosis not present

## 2016-11-24 DIAGNOSIS — Z79899 Other long term (current) drug therapy: Secondary | ICD-10-CM | POA: Diagnosis not present

## 2016-11-24 DIAGNOSIS — R0789 Other chest pain: Secondary | ICD-10-CM | POA: Insufficient documentation

## 2016-11-24 DIAGNOSIS — Z7901 Long term (current) use of anticoagulants: Secondary | ICD-10-CM | POA: Diagnosis not present

## 2016-11-24 DIAGNOSIS — Z882 Allergy status to sulfonamides status: Secondary | ICD-10-CM | POA: Diagnosis not present

## 2016-11-24 LAB — COMPREHENSIVE METABOLIC PANEL
ALT: 15 U/L (ref 14–54)
ANION GAP: 7 (ref 5–15)
AST: 16 U/L (ref 15–41)
Albumin: 3.8 g/dL (ref 3.5–5.0)
Alkaline Phosphatase: 64 U/L (ref 38–126)
BUN: 20 mg/dL (ref 6–20)
CHLORIDE: 106 mmol/L (ref 101–111)
CO2: 27 mmol/L (ref 22–32)
Calcium: 9.8 mg/dL (ref 8.9–10.3)
Creatinine, Ser: 0.72 mg/dL (ref 0.44–1.00)
Glucose, Bld: 137 mg/dL — ABNORMAL HIGH (ref 65–99)
POTASSIUM: 4.2 mmol/L (ref 3.5–5.1)
SODIUM: 140 mmol/L (ref 135–145)
Total Bilirubin: 0.6 mg/dL (ref 0.3–1.2)
Total Protein: 6.7 g/dL (ref 6.5–8.1)

## 2016-11-24 LAB — CBC
HCT: 40 % (ref 36.0–46.0)
Hemoglobin: 13.2 g/dL (ref 12.0–15.0)
MCH: 30.7 pg (ref 26.0–34.0)
MCHC: 33 g/dL (ref 30.0–36.0)
MCV: 93 fL (ref 78.0–100.0)
PLATELETS: 198 10*3/uL (ref 150–400)
RBC: 4.3 MIL/uL (ref 3.87–5.11)
RDW: 13.1 % (ref 11.5–15.5)
WBC: 10.2 10*3/uL (ref 4.0–10.5)

## 2016-11-24 LAB — TSH: TSH: 2.559 u[IU]/mL (ref 0.350–4.500)

## 2016-11-24 MED ORDER — CARVEDILOL 6.25 MG PO TABS: 9.3750 mg | ORAL_TABLET | Freq: Two times a day (BID) | ORAL | 3 refills | Status: DC

## 2016-11-24 NOTE — Patient Instructions (Signed)
Your physician has recommended you make the following change in your medication:  1)Increase coreg to 1 and 1/2 tablets twice a day (9.375mg  twice a day)

## 2016-11-25 NOTE — Progress Notes (Addendum)
Primary Care Physician: Crist Infante, MD Referring Physician: Dr. Cephus Richer Ezzell is a 83 y.o. female with a h/o paroxysmal afib that is in the clinic for increase in atrial activity on remote pacer checks and pt c/o of chest pressure with activity. In the clinic today, the pt is pain free and in SR. Device interrogated by industry and found 10% Af burden but many of these episodes appear as PAC's, some true AF, longest episode 2 hours, many mins at a time. This am she has chest pressure while moving around, it usually gets better with rest. Yesterday, she reported same symptoms, no afib noted yesterday. She feels the symptoms more in the am and feels better overall in the afternoon.  Today, she denies symptoms of palpitations,, shortness of breath, orthopnea, PND, lower extremity edema, dizziness, presyncope, syncope, or neurologic sequela. The patient is tolerating medications without difficulties and is otherwise without complaint today.   Past Medical History:  Diagnosis Date  . Anxiety   . Arthritis    "hands; between shoulders; back; feet"  . Chronic anticoagulation    -->Eliquis  . Chronic diastolic CHF (congestive heart failure) ([place])    a. 06/2011 Echo: EF 60-65%.  . Depression   . Essential hypertension   . High risk medication use    on Flecainide  . Kidney stone    passed in [place] 2014  . Moderate aortic insufficiency    a. 06/2011 Echo: EF 60-65%, mild LVH, no rwma, mod AI.  Marland Kitchen Osteoporosis   . Osteoporosis   . PAF (paroxysmal atrial fibrillation) (H[facility])    a. chronic flecainide->reduced to 25 bid 08/2014.  Marland Kitchen Panic attacks    "since losing husband 1994"  . Skin cancer ?1960's   "between breasts"  . SSS (sick sinus syndrome) (H[facility])    a. s/p pacemaker. b. s/p Medtronic gen change 07/2011; c.   . Type II diabetes mellitus ([place])    "controlled w/diet"   Past Surgical History:  Procedure Laterality Date  . APPENDECTOMY  1955  . BREAST CYST EXCISION  ~ 1950   right    . BREAST LUMPECTOMY WITH NEEDLE LOCALIZATION Bilateral 02/03/2014   Procedure: BILATERAL BREAST LUMPECTOMY WITH NEEDLE LOCALIZATION ON LEFT;  Surgeon: Excell Seltzer, MD;  Location: [place];  Service: General;  Laterality: Bilateral;  . CARDIOVASCULAR STRESS TEST  12/09/2004  . CATARACT EXTRACTION W/ INTRAOCULAR LENS  IMPLANT, BILATERAL  1990's  . [place]  . DILATION AND CURETTAGE OF UTERUS  1953  . INSERT / REPLACE / REMOVE PACEMAKER  03/16/2006  . PERMANENT PACEMAKER GENERATOR CHANGE  08/03/2011   Procedure: PERMANENT PACEMAKER GENERATOR CHANGE;  Surgeon: Thompson Grayer, MD;  Location: [facility] CATH LAB;  Service: Cardiovascular;;  . SKIN CANCER EXCISION  ?1960's   "between my breasts"  . TONSILLECTOMY     "school age"    Current Outpatient Prescriptions  Medication Sig Dispense Refill  . acetaminophen (TYLENOL) 500 MG tablet Take 500-1,000 mg by mouth every 6 (six) hours as needed for mild pain, moderate pain, fever or headache.    Marland Kitchen apixaban (ELIQUIS) 2.5 MG TABS tablet Take 1 tablet (2.5 mg total) by mouth 2 (two) times daily. 60 tablet 11  . carvedilol (COREG) 6.25 MG tablet Take 1.5 tablets (9.375 mg total) by mouth 2 (two) times daily. 90 tablet 3  . CRESTOR 5 MG tablet Take 2.5 mg by mouth every Monday, Wednesday, and Friday.   7  . diltiazem (CARDIZEM  CD) 240 MG 24 hr capsule Take 240 mg by mouth daily.     Marland Kitchen LORazepam (ATIVAN) 0.5 MG tablet Take 0.25-0.5 mg by mouth 3 (three) times daily. Take 0.25 mg tablet in the morning and afternoon, then 0.5 mg at bedtime    . polyethylene glycol (MIRALAX / GLYCOLAX) packet Take 17 g by mouth daily as needed for mild constipation.     . sertraline (ZOLOFT) 50 MG tablet Take 50 mg by mouth daily.  3  . Vitamin D, Ergocalciferol, (DRISDOL) 50000 UNITS CAPS capsule Take 50,000 Units by mouth every Wednesday.    Marland Kitchen azelastine (ASTELIN) 0.1 % nasal spray Place 1 spray into both nostrils daily as needed for rhinitis. Use in each nostril as  directed    . estradiol (ESTRACE) 0.1 MG/GM vaginal cream Place 1 Applicatorful vaginally every Monday, Wednesday, and Friday.    . Flaxseed, Linseed, (FLAX SEEDS PO) Take 2.5 mLs by mouth daily with breakfast.     No current facility-administered medications for this encounter.     Allergies  Allergen Reactions  . Sulfonamide Derivatives Rash    "it was all over my legs"  . Citalopram Hydrobromide Other (See Comments)    Pt does not remember reaction    Social History   Social History  . Marital status: Widowed    Spouse name: N/A  . Number of children: 2  . Years of education: N/A   Occupational History  . Not on file.   Social History Main Topics  . Smoking status: Never Smoker  . Smokeless tobacco: Never Used  . Alcohol use No  . Drug use: No  . Sexual activity: No   Other Topics Concern  . Not on file   Social History Narrative   Lives alone    Family History  Problem Relation Age of Onset  . Emphysema Father     ROS- All systems are reviewed and negative except as per the HPI above  Physical Exam: Vitals:   11/24/16 1423  BP: (!) 128/52  Pulse: 89  Weight: 129 lb 6.4 oz (58.7 kg)  Height: 5\' 2"  (1.575 m)   Wt Readings from Last 3 Encounters:  11/24/16 129 lb 6.4 oz (58.7 kg)  10/27/16 128 lb 3.2 oz (58.2 kg)  10/26/16 128 lb (58.1 kg)    Labs: Lab Results  Component Value Date   NA 140 11/24/2016   K 4.2 11/24/2016   CL 106 11/24/2016   CO2 27 11/24/2016   GLUCOSE 137 (H) 11/24/2016   BUN 20 11/24/2016   CREATININE 0.72 11/24/2016   CALCIUM 9.8 11/24/2016   PHOS 3.1 07/09/2012   MG 1.6 07/09/2012   Lab Results  Component Value Date   INR 1.41 07/06/2012   No results found for: CHOL, HDL, LDLCALC, TRIG   GEN- The patient is well appearing, alert and oriented x 3 today.   Head- normocephalic, atraumatic Eyes-  Sclera clear, conjunctiva pink Ears- hearing intact Oropharynx- clear Neck- supple, no JVP Lymph- no cervical  lymphadenopathy Lungs- Clear to ausculation bilaterally, normal work of breathing Heart- irregular rate and rhythm, no murmurs, rubs or gallops, PMI not laterally displaced GI- soft, NT, ND, + BS Extremities- no clubbing, cyanosis, or edema MS- no significant deformity or atrophy Skin- no rash or lesion Psych- euthymic mood, full affect Neuro- strength and sensation are intact  EKG- Sinus rhythm with frequent atrial paced complexes and PVC's in a bigeminal pattern Device interrogated for afib burden  Assessment and Plan: 1. Intermittent exertional chest pressure I am not sure if her symptoms correlate with afib, as the last two mornings, she c/o of symptoms and she was in SR Symptoms are concerning for angina as well, as she notices more with exertion and go away with rest Continue eliquis 2.5 mg bid for a chadsvasc score of at least 6 I will increase carvedilol 6.25 mg bid to 1 1/2 tabs bid CBC, TSH, CMet,   Return in one week for repeat interrogation and review of symptoms  Butch Penny C. Phillipe Clemon, [facility] [address] [place], [place] [address] [phone]

## 2016-11-25 NOTE — Addendum Note (Signed)
Encounter addended by: Sherran Needs, NP on: 11/25/2016  9:16 AM<BR>    Actions taken: Sign clinical note

## 2016-11-29 ENCOUNTER — Ambulatory Visit (HOSPITAL_COMMUNITY): Payer: Medicare Other | Admitting: Nurse Practitioner

## 2016-11-30 ENCOUNTER — Ambulatory Visit (HOSPITAL_COMMUNITY): Payer: Medicare Other | Admitting: Nurse Practitioner

## 2016-12-01 ENCOUNTER — Ambulatory Visit (HOSPITAL_COMMUNITY)
Admission: RE | Admit: 2016-12-01 | Discharge: 2016-12-01 | Disposition: A | Discharge status: Home / Self Care | Payer: Medicare Other | Source: Ambulatory Visit | Attending: Nurse Practitioner | Admitting: Nurse Practitioner

## 2016-12-01 ENCOUNTER — Encounter (HOSPITAL_COMMUNITY): Payer: Self-pay | Admitting: Nurse Practitioner

## 2016-12-01 VITALS — BP 124/54 | HR 64 | Ht 62.0 in | Wt 122.0 lb

## 2016-12-01 DIAGNOSIS — F329 Major depressive disorder, single episode, unspecified: Secondary | ICD-10-CM | POA: Insufficient documentation

## 2016-12-01 DIAGNOSIS — M1990 Unspecified osteoarthritis, unspecified site: Secondary | ICD-10-CM | POA: Insufficient documentation

## 2016-12-01 DIAGNOSIS — I110 Hypertensive heart disease with heart failure: Secondary | ICD-10-CM | POA: Insufficient documentation

## 2016-12-01 DIAGNOSIS — Z882 Allergy status to sulfonamides status: Secondary | ICD-10-CM | POA: Insufficient documentation

## 2016-12-01 DIAGNOSIS — Z79899 Other long term (current) drug therapy: Secondary | ICD-10-CM | POA: Insufficient documentation

## 2016-12-01 DIAGNOSIS — R531 Weakness: Secondary | ICD-10-CM

## 2016-12-01 DIAGNOSIS — I351 Nonrheumatic aortic (valve) insufficiency: Secondary | ICD-10-CM | POA: Diagnosis not present

## 2016-12-01 DIAGNOSIS — Z950 Presence of cardiac pacemaker: Secondary | ICD-10-CM | POA: Diagnosis not present

## 2016-12-01 DIAGNOSIS — Z7901 Long term (current) use of anticoagulants: Secondary | ICD-10-CM | POA: Insufficient documentation

## 2016-12-01 DIAGNOSIS — I5032 Chronic diastolic (congestive) heart failure: Secondary | ICD-10-CM | POA: Insufficient documentation

## 2016-12-01 DIAGNOSIS — E119 Type 2 diabetes mellitus without complications: Secondary | ICD-10-CM | POA: Diagnosis not present

## 2016-12-01 DIAGNOSIS — F419 Anxiety disorder, unspecified: Secondary | ICD-10-CM | POA: Diagnosis not present

## 2016-12-01 DIAGNOSIS — I480 Paroxysmal atrial fibrillation: Secondary | ICD-10-CM | POA: Insufficient documentation

## 2016-12-01 DIAGNOSIS — M810 Age-related osteoporosis without current pathological fracture: Secondary | ICD-10-CM | POA: Diagnosis not present

## 2016-12-01 MED ORDER — CARVEDILOL 6.25 MG PO TABS: 6.2500 mg | ORAL_TABLET | Freq: Two times a day (BID) | ORAL | 3 refills | Status: DC

## 2016-12-01 NOTE — Patient Instructions (Signed)
Your physician has recommended you make the following change in your medication: 1)Decrease coreg to 1 tablet twice a day  Butch Penny will be in touch after speaking with Dr. Rayann Heman.

## 2016-12-01 NOTE — Progress Notes (Addendum)
Primary Care Physician: Crist Infante, MD Referring Physician: Dr. Phillis Knack Jo [name] is a 83 y.o. female with a h/o paroxysmal afib that is in the clinic for increase in atrial activity on remote pacer checks and pt c/o of chest pressure with activity. In the clinic today, the pt is pain free and in SR. Device interrogated by industry and found 10% Af burden but many of these episodes appear as PAC's, some true AF, longest episode 2 hours, many mins at a time. This am she has chest pressure while moving around, it usually gets better with rest. Yesterday, she reported same symptoms, no afib noted yesterday. She feels the symptoms more in the am and feels better overall in the afternoon.  F/u in afib clinic, 11/8, she is in [place] today, by interrogation afib burden is improved at 2 %, however she feels worse than last week due to weakness, possibly 2/2 increase in BB. She feels  weak this am but did not have any afib today. Last significant afib was around an hour 11/6 at 9 pm, she was not aware. Pac's have been decreased with increase in coreg. The son with her today states that she has has a lot of complaints and it is hard to know if she is better or worse, probably about the same.  Today, she denies symptoms of palpitations,, shortness of breath, orthopnea, PND, lower extremity edema, dizziness, presyncope, syncope, or neurologic sequela. The patient is tolerating medications without difficulties and is otherwise without complaint today.   Past Medical History:  Diagnosis Date  . Anxiety   . Arthritis    "hands; between shoulders; back; feet"  . Chronic anticoagulation    -->Eliquis  . Chronic diastolic CHF (congestive heart failure) ([place])    a. 06/2011 Echo: EF 60-65%.  . Depression   . Essential hypertension   . High risk medication use    on Flecainide  . Kidney stone    passed in [place] 2014  . Moderate aortic insufficiency    a. 06/2011 Echo: EF 60-65%, mild LVH, no rwma, mod AI.  Marland Kitchen  Osteoporosis   . Osteoporosis   . PAF (paroxysmal atrial fibrillation) ([facility])    a. chronic flecainide->reduced to 25 bid 08/2014.  Marland Kitchen Panic attacks    "since losing husband 1994"  . Skin cancer ?1960's   "between breasts"  . SSS (sick sinus syndrome) ([facility])    a. s/p pacemaker. b. s/p Medtronic gen change 07/2011; c.   . Type II diabetes mellitus ([place])    "controlled w/diet"   Past Surgical History:  Procedure Laterality Date  . APPENDECTOMY  1955  . BREAST CYST EXCISION  ~ 1950   right  . CARDIOVASCULAR STRESS TEST  12/09/2004  . CATARACT EXTRACTION W/ INTRAOCULAR LENS  IMPLANT, BILATERAL  1990's  . [place]  . DILATION AND CURETTAGE OF UTERUS  1953  . INSERT / REPLACE / REMOVE PACEMAKER  03/16/2006  . SKIN CANCER EXCISION  ?1960's   "between my breasts"  . TONSILLECTOMY     "school age"    Current Outpatient Medications  Medication Sig Dispense Refill  . acetaminophen (TYLENOL) 500 MG tablet Take 500-1,000 mg by mouth every 6 (six) hours as needed for mild pain, moderate pain, fever or headache.    Marland Kitchen apixaban (ELIQUIS) 2.5 MG TABS tablet Take 1 tablet (2.5 mg total) by mouth 2 (two) times daily. 60 tablet 11  . azelastine (ASTELIN) 0.1 % nasal spray  Place 1 spray into both nostrils daily as needed for rhinitis. Use in each nostril as directed    . carvedilol (COREG) 6.25 MG tablet Take 1 tablet (6.25 mg total) 2 (two) times daily by mouth. 90 tablet 3  . CRESTOR 5 MG tablet Take 2.5 mg by mouth every Monday, Wednesday, and Friday.   7  . diltiazem (CARDIZEM CD) 240 MG 24 hr capsule Take 240 mg by mouth daily.     Marland Kitchen estradiol (ESTRACE) 0.1 MG/GM vaginal cream Place 1 Applicatorful vaginally every Monday, Wednesday, and Friday.    . Flaxseed, Linseed, (FLAX SEEDS PO) Take 2.5 mLs by mouth daily with breakfast.    . LORazepam (ATIVAN) 0.5 MG tablet Take 0.25-0.5 mg by mouth 3 (three) times daily. Take 0.25 mg tablet in the morning and afternoon, then 0.5 mg at bedtime     . polyethylene glycol (MIRALAX / GLYCOLAX) packet Take 17 g by mouth daily as needed for mild constipation.     . sertraline (ZOLOFT) 50 MG tablet Take 50 mg by mouth daily.  3  . Vitamin D, Ergocalciferol, (DRISDOL) 50000 UNITS CAPS capsule Take 50,000 Units by mouth every Wednesday.     No current facility-administered medications for this encounter.     Allergies  Allergen Reactions  . Sulfonamide Derivatives Rash    "it was all over my legs"  . Citalopram Hydrobromide Other (See Comments)    Pt does not remember reaction    Social History   Socioeconomic History  . Marital status: Widowed    Spouse name: Not on file  . Number of children: 2  . Years of education: Not on file  . Highest education level: Not on file  Social Needs  . Financial resource strain: Not on file  . Food insecurity - worry: Not on file  . Food insecurity - inability: Not on file  . Transportation needs - medical: Not on file  . Transportation needs - non-medical: Not on file  Occupational History  . Not on file  Tobacco Use  . Smoking status: Never Smoker  . Smokeless tobacco: Never Used  Substance and Sexual Activity  . Alcohol use: No  . Drug use: No  . Sexual activity: No  Other Topics Concern  . Not on file  Social History Narrative   Lives alone    Family History  Problem Relation Age of Onset  . Emphysema Father     ROS- All systems are reviewed and negative except as per the HPI above  Physical Exam: Vitals:   12/01/16 1358  BP: (!) 124/54  Pulse: 64  Weight: 122 lb (55.3 kg)  Height: 5\' 2"  (1.575 m)   Wt Readings from Last 3 Encounters:  12/01/16 122 lb (55.3 kg)  11/24/16 129 lb 6.4 oz (58.7 kg)  10/27/16 128 lb 3.2 oz (58.2 kg)    Labs: Lab Results  Component Value Date   NA 140 11/24/2016   K 4.2 11/24/2016   CL 106 11/24/2016   CO2 27 11/24/2016   GLUCOSE 137 (H) 11/24/2016   BUN 20 11/24/2016   CREATININE 0.72 11/24/2016   CALCIUM 9.8 11/24/2016    PHOS 3.1 07/09/2012   MG 1.6 07/09/2012   Lab Results  Component Value Date   INR 1.41 07/06/2012   No results found for: CHOL, HDL, LDLCALC, TRIG   GEN- The patient is well appearing, alert and oriented x 3 today.   Head- normocephalic, atraumatic Eyes-  Sclera clear, conjunctiva pink  Ears- hearing intact Oropharynx- clear Neck- supple, no JVP Lymph- no cervical lymphadenopathy Lungs- Clear to ausculation bilaterally, normal work of breathing Heart- irregular rate and rhythm, no murmurs, rubs or gallops, PMI not laterally displaced GI- soft, NT, ND, + BS Extremities- no clubbing, cyanosis, or edema MS- no significant deformity or atrophy Skin- no rash or lesion Psych- euthymic mood, full affect Neuro- strength and sensation are intact  EKG- atrial paced at 64 bpm, brief interrogation for afib burden shows  2 episodes of lasting around one hour, overall burden decreased to 2% from last interrogation of 10%,with less PAC's    Assessment and Plan: 1. Paroxysmal afib This week, pt is not c/o chest heaviness but weakness Afib burden is decreased but pt seems more symptomatic   I am not sure if her symptoms correlate with afib  Recent weakness I am concerned Le[place]ert be from increase in BB Continue eliquis 2.5 mg bid for a chadsvasc score of at least 6 Decrease coreg back to 6.25 mg bid from 6.25 mg bid 1 1/2 tabs bid, to help with weakness I will discuss with Dr. Rayann Heman to see if there is anything else to try and let daughter know  11/13- Discussed with Dr. Rayann Heman and since pt's symptoms did not really correlate with afib episodes and increase in BB made pt weak, Dr. Rayann Heman suggested to just continue with current treatment. Daughter informed.  Geroge Baseman Kenner Lewan, [facility] [address] [place], [place] [address] [phone]

## 2016-12-06 NOTE — Addendum Note (Signed)
Encounter addended by: Sherran Needs, NP on: 12/06/2016 10:25 AM  Actions taken: Sign clinical note

## 2016-12-06 NOTE — Addendum Note (Signed)
Encounter addended by: Sherran Needs, NP on: 12/06/2016 8:45 AM  Actions taken: LOS modified

## 2016-12-09 DIAGNOSIS — M1990 Unspecified osteoarthritis, unspecified site: Secondary | ICD-10-CM | POA: Diagnosis not present

## 2016-12-09 DIAGNOSIS — I5032 Chronic diastolic (congestive) heart failure: Secondary | ICD-10-CM | POA: Diagnosis not present

## 2016-12-09 DIAGNOSIS — I495 Sick sinus syndrome: Secondary | ICD-10-CM | POA: Diagnosis not present

## 2016-12-09 DIAGNOSIS — I110 Hypertensive heart disease with heart failure: Secondary | ICD-10-CM | POA: Diagnosis not present

## 2016-12-09 DIAGNOSIS — I4891 Unspecified atrial fibrillation: Secondary | ICD-10-CM | POA: Diagnosis not present

## 2016-12-09 DIAGNOSIS — E119 Type 2 diabetes mellitus without complications: Secondary | ICD-10-CM | POA: Diagnosis not present

## 2016-12-21 DIAGNOSIS — I4891 Unspecified atrial fibrillation: Secondary | ICD-10-CM | POA: Diagnosis not present

## 2016-12-21 DIAGNOSIS — M1990 Unspecified osteoarthritis, unspecified site: Secondary | ICD-10-CM | POA: Diagnosis not present

## 2016-12-21 DIAGNOSIS — I110 Hypertensive heart disease with heart failure: Secondary | ICD-10-CM | POA: Diagnosis not present

## 2016-12-21 DIAGNOSIS — I5032 Chronic diastolic (congestive) heart failure: Secondary | ICD-10-CM | POA: Diagnosis not present

## 2016-12-21 DIAGNOSIS — E119 Type 2 diabetes mellitus without complications: Secondary | ICD-10-CM | POA: Diagnosis not present

## 2016-12-21 DIAGNOSIS — I495 Sick sinus syndrome: Secondary | ICD-10-CM | POA: Diagnosis not present

## 2016-12-27 ENCOUNTER — Other Ambulatory Visit: Payer: Self-pay

## 2017-01-11 DIAGNOSIS — H02831 Dermatochalasis of right upper eyelid: Secondary | ICD-10-CM | POA: Diagnosis not present

## 2017-01-11 DIAGNOSIS — H3530 Unspecified macular degeneration: Secondary | ICD-10-CM | POA: Diagnosis not present

## 2017-01-11 DIAGNOSIS — H02834 Dermatochalasis of left upper eyelid: Secondary | ICD-10-CM | POA: Diagnosis not present

## 2017-01-11 DIAGNOSIS — Z961 Presence of intraocular lens: Secondary | ICD-10-CM | POA: Diagnosis not present

## 2017-01-11 DIAGNOSIS — H52203 Unspecified astigmatism, bilateral: Secondary | ICD-10-CM | POA: Diagnosis not present

## 2017-01-11 DIAGNOSIS — H5213 Myopia, bilateral: Secondary | ICD-10-CM | POA: Diagnosis not present

## 2017-02-16 ENCOUNTER — Ambulatory Visit (INDEPENDENT_AMBULATORY_CARE_PROVIDER_SITE_OTHER): Payer: Medicare Other | Admitting: *Deleted

## 2017-02-16 DIAGNOSIS — I495 Sick sinus syndrome: Secondary | ICD-10-CM

## 2017-02-16 NOTE — Progress Notes (Signed)
Remote pacemaker transmission.   

## 2017-02-17 ENCOUNTER — Encounter: Payer: Self-pay | Admitting: Cardiology

## 2017-03-07 LAB — CUP PACEART REMOTE DEVICE CHECK
Battery Remaining Longevity: 120 mo
Battery Voltage: 2.81 V
Implantable Lead Implant Date: 20080221
Implantable Lead Implant Date: 20080221
Implantable Lead Location: 753859
Implantable Pulse Generator Implant Date: 20130710
Lead Channel Pacing Threshold Amplitude: 0.5 V
Lead Channel Pacing Threshold Pulse Width: 0.4 ms
Lead Channel Setting Pacing Amplitude: 2 V
Lead Channel Setting Pacing Pulse Width: 0.4 ms
Lead Channel Setting Sensing Sensitivity: 2 mV
MDC IDC LEAD LOCATION: 753860
MDC IDC MSMT BATTERY IMPEDANCE: 256 Ohm
MDC IDC MSMT LEADCHNL RA IMPEDANCE VALUE: 465 Ohm
MDC IDC MSMT LEADCHNL RA PACING THRESHOLD PULSEWIDTH: 0.4 ms
MDC IDC MSMT LEADCHNL RV IMPEDANCE VALUE: 399 Ohm
MDC IDC MSMT LEADCHNL RV PACING THRESHOLD AMPLITUDE: 0.75 V
MDC IDC SESS DTM: 20190124141102
MDC IDC SET LEADCHNL RV PACING AMPLITUDE: 2.5 V
MDC IDC STAT BRADY AP VP PERCENT: 1 %
MDC IDC STAT BRADY AP VS PERCENT: 88 %
MDC IDC STAT BRADY AS VP PERCENT: 0 %
MDC IDC STAT BRADY AS VS PERCENT: 11 %

## 2017-04-11 DIAGNOSIS — I10 Essential (primary) hypertension: Secondary | ICD-10-CM | POA: Diagnosis not present

## 2017-04-11 DIAGNOSIS — I480 Paroxysmal atrial fibrillation: Secondary | ICD-10-CM | POA: Diagnosis not present

## 2017-04-11 DIAGNOSIS — Z6823 Body mass index (BMI) 23.0-23.9, adult: Secondary | ICD-10-CM | POA: Diagnosis not present

## 2017-04-11 DIAGNOSIS — E119 Type 2 diabetes mellitus without complications: Secondary | ICD-10-CM | POA: Diagnosis not present

## 2017-04-11 DIAGNOSIS — F418 Other specified anxiety disorders: Secondary | ICD-10-CM | POA: Diagnosis not present

## 2017-04-11 DIAGNOSIS — C50919 Malignant neoplasm of unspecified site of unspecified female breast: Secondary | ICD-10-CM | POA: Diagnosis not present

## 2017-04-11 DIAGNOSIS — Z1389 Encounter for screening for other disorder: Secondary | ICD-10-CM | POA: Diagnosis not present

## 2017-04-11 DIAGNOSIS — M810 Age-related osteoporosis without current pathological fracture: Secondary | ICD-10-CM | POA: Diagnosis not present

## 2017-05-01 ENCOUNTER — Encounter (HOSPITAL_COMMUNITY): Payer: Self-pay

## 2017-05-01 ENCOUNTER — Ambulatory Visit (HOSPITAL_COMMUNITY)
Admission: RE | Admit: 2017-05-01 | Discharge: 2017-05-01 | Disposition: A | Discharge status: Home / Self Care | Payer: Medicare Other | Source: Ambulatory Visit | Attending: Internal Medicine | Admitting: Internal Medicine

## 2017-05-01 DIAGNOSIS — M810 Age-related osteoporosis without current pathological fracture: Secondary | ICD-10-CM | POA: Diagnosis not present

## 2017-05-01 MED ORDER — ZOLEDRONIC ACID 5 MG/100ML IV SOLN
5.0000 mg | Freq: Once | INTRAVENOUS | Status: AC
  Administered 2017-05-01: 5 mg via INTRAVENOUS
  Filled 2017-05-01: qty 100

## 2017-05-01 MED ORDER — SODIUM CHLORIDE 0.9 % IV SOLN
Freq: Once | INTRAVENOUS | Status: AC
  Administered 2017-05-01: 12:00:00 via INTRAVENOUS

## 2017-05-01 NOTE — Discharge Instructions (Signed)
Zoledronic Acid injection (Paget's Disease, Osteoporosis) °What is this medicine? °ZOLEDRONIC ACID (ZOE le dron ik AS id) lowers the amount of calcium loss from bone. It is used to treat Paget's disease and osteoporosis in women. °This medicine Duff be used for other purposes; ask your health care provider or pharmacist if you have questions. °COMMON BRAND NAME(S): Reclast, Zometa °What should I tell my health care provider before I take this medicine? °They need to know if you have any of these conditions: °-aspirin-sensitive asthma °-cancer, especially if you are receiving medicines used to treat cancer °-dental disease or wear dentures °-infection °-kidney disease °-low levels of calcium in the blood °-past surgery on the parathyroid gland or intestines °-receiving corticosteroids like dexamethasone or prednisone °-an unusual or allergic reaction to zoledronic acid, other medicines, foods, dyes, or preservatives °-pregnant or trying to get pregnant °-breast-feeding °How should I use this medicine? °This medicine is for infusion into a vein. It is given by a health care professional in a hospital or clinic setting. °Talk to your pediatrician regarding the use of this medicine in children. This medicine is not approved for use in children. °Overdosage: If you think you have taken too much of this medicine contact a poison control center or emergency room at once. °NOTE: This medicine is only for you. Do not share this medicine with others. °What if I miss a dose? °It is important not to miss your dose. Call your doctor or health care professional if you are unable to keep an appointment. °What Vahey interact with this medicine? °-certain antibiotics given by injection °-NSAIDs, medicines for pain and inflammation, like ibuprofen or naproxen °-some diuretics like bumetanide, furosemide °-teriparatide °This list Evon not describe all possible interactions. Give your health care provider a list of all the medicines,  herbs, non-prescription drugs, or dietary supplements you use. Also tell them if you smoke, drink alcohol, or use illegal drugs. Some items Doerner interact with your medicine. °What should I watch for while using this medicine? °Visit your doctor or health care professional for regular checkups. It Tantillo be some time before you see the benefit from this medicine. Do not stop taking your medicine unless your doctor tells you to. Your doctor Rudzinski order blood tests or other tests to see how you are doing. °Women should inform their doctor if they wish to become pregnant or think they might be pregnant. There is a potential for serious side effects to an unborn child. Talk to your health care professional or pharmacist for more information. °You should make sure that you get enough calcium and vitamin D while you are taking this medicine. Discuss the foods you eat and the vitamins you take with your health care professional. °Some people who take this medicine have severe bone, joint, and/or muscle pain. This medicine Follette also increase your risk for jaw problems or a broken thigh bone. Tell your doctor right away if you have severe pain in your jaw, bones, joints, or muscles. Tell your doctor if you have any pain that does not go away or that gets worse. °Tell your dentist and dental surgeon that you are taking this medicine. You should not have major dental surgery while on this medicine. See your dentist to have a dental exam and fix any dental problems before starting this medicine. Take good care of your teeth while on this medicine. Make sure you see your dentist for regular follow-up appointments. °What side effects Storck I notice from receiving this medicine? °  Side effects that you should report to your doctor or health care professional as soon as possible: °-allergic reactions like skin rash, itching or hives, swelling of the face, lips, or tongue °-anxiety, confusion, or depression °-breathing problems °-changes in  vision °-eye pain °-feeling faint or lightheaded, falls °-jaw pain, especially after dental work °-mouth sores °-muscle cramps, stiffness, or weakness °-redness, blistering, peeling or loosening of the skin, including inside the mouth °-trouble passing urine or change in the amount of urine °Side effects that usually do not require medical attention (report to your doctor or health care professional if they continue or are bothersome): °-bone, joint, or muscle pain °-constipation °-diarrhea °-fever °-hair loss °-irritation at site where injected °-loss of appetite °-nausea, vomiting °-stomach upset °-trouble sleeping °-trouble swallowing °-weak or tired °This list Burnett not describe all possible side effects. Call your doctor for medical advice about side effects. You Meester report side effects to FDA at 1-800-FDA-1088. °Where should I keep my medicine? °This drug is given in a hospital or clinic and will not be stored at home. °NOTE: This sheet is a summary. It [place] not cover all possible information. If you have questions about this medicine, talk to your doctor, pharmacist, or health care provider. °© 2018 Elsevier/Gold Standard (2013-06-08 14:19:57) ° °

## 2017-05-18 ENCOUNTER — Ambulatory Visit (INDEPENDENT_AMBULATORY_CARE_PROVIDER_SITE_OTHER): Payer: Medicare Other | Admitting: *Deleted

## 2017-05-18 DIAGNOSIS — I495 Sick sinus syndrome: Secondary | ICD-10-CM

## 2017-05-19 ENCOUNTER — Encounter: Payer: Self-pay | Admitting: Cardiology

## 2017-05-19 NOTE — Progress Notes (Signed)
Remote pacemaker transmission.   

## 2017-05-21 ENCOUNTER — Other Ambulatory Visit (HOSPITAL_COMMUNITY): Payer: Self-pay | Admitting: Nurse Practitioner

## 2017-05-22 NOTE — Progress Notes (Signed)
Jo [name] Date of Birth: Jan 08, 1918 Medical Record #[id number]  History of Present Illness: Jo [name] is seen back today for follow up Afib. She has a history of AI, depression, DM, HTN and PAF with a pacemaker in place. Had been on chronic Flecainide and Eliquis. Flecainide discontinued in April 2017 due to visual disturbance and weakness. Last generator change back in 2013. Pacer check in July 2018 showed 63 AMS <1%. . She was admitted in early June 2017 with CAP. Echo at that time showed normal LV function and only mild AI.   She was readmitted in August 2018 with generalized weakness, dehydration, and a UTI. Echo repeated and was stable.   On follow up she is doing well. She is seen with her daughter.   Denies any chest pain or palpitations. She does note SOB occasionally with exertion.  No edema. She is walking with a walker. She had a pacemaker check in January  which showed Afib burden of 13%. No bleeding problems on Eliquis.  Current Outpatient Medications  Medication Sig Dispense Refill  . acetaminophen (TYLENOL) 500 MG tablet Take 500-1,000 mg by mouth every 6 (six) hours as needed for mild pain, moderate pain, fever or headache.    Marland Kitchen apixaban (ELIQUIS) 2.5 MG TABS tablet Take 1 tablet (2.5 mg total) by mouth 2 (two) times daily. 60 tablet 11  . azelastine (ASTELIN) 0.1 % nasal spray Place 1 spray into both nostrils daily as needed for rhinitis. Use in each nostril as directed    . carvedilol (COREG) 6.25 MG tablet Take 1 tablet (6.25 mg total) 2 (two) times daily by mouth. 90 tablet 3  . carvedilol (COREG) 6.25 MG tablet Take 1 tablet (6.25 mg total) by mouth 2 (two) times daily with a meal. 90 tablet 2  . CRESTOR 5 MG tablet Take 2.5 mg by mouth every Monday, Wednesday, and Friday.   7  . diltiazem (CARDIZEM CD) 240 MG 24 hr capsule Take 240 mg by mouth daily.     Marland Kitchen estradiol (ESTRACE) 0.1 MG/GM vaginal cream Place 1 Applicatorful vaginally every Monday, Wednesday, and Friday.     . Flaxseed, Linseed, (FLAX SEEDS PO) Take 2.5 mLs by mouth daily with breakfast.    . LORazepam (ATIVAN) 0.5 MG tablet Take 0.25-0.5 mg by mouth 3 (three) times daily. Take 0.25 mg tablet in the morning and afternoon, then 0.5 mg at bedtime    . polyethylene glycol (MIRALAX / GLYCOLAX) packet Take 17 g by mouth daily as needed for mild constipation.     . sertraline (ZOLOFT) 50 MG tablet Take 50 mg by mouth daily.  3  . Vitamin D, Ergocalciferol, (DRISDOL) 50000 UNITS CAPS capsule Take 50,000 Units by mouth every Wednesday.    . linagliptin (TRADJENTA) 5 MG TABS tablet Take 1 tablet (5 mg total) by mouth daily. 30 tablet    No current facility-administered medications for this visit.     Allergies  Allergen Reactions  . Sulfonamide Derivatives Rash    "it was all over my legs"  . Citalopram Hydrobromide Other (See Comments)    Pt does not remember reaction    Past Medical History:  Diagnosis Date  . Anxiety   . Arthritis    "hands; between shoulders; back; feet"  . Chronic anticoagulation    -->Eliquis  . Chronic diastolic CHF (congestive heart failure) ([place])    a. 06/2011 Echo: EF 60-65%.  . Depression   . Essential hypertension   . High risk  medication use    on Flecainide  . Kidney stone    passed in [place] 2014  . Moderate aortic insufficiency    a. 06/2011 Echo: EF 60-65%, mild LVH, no rwma, mod AI.  Marland Kitchen Osteoporosis   . Osteoporosis   . PAF (paroxysmal atrial fibrillation) ([facility])    a. chronic flecainide->reduced to 25 bid 08/2014.  Marland Kitchen Panic attacks    "since losing husband 1994"  . Skin cancer ?1960's   "between breasts"  . SSS (sick sinus syndrome) ([facility])    a. s/p pacemaker. b. s/p Medtronic gen change 07/2011; c.   . Type II diabetes mellitus ([place])    "controlled w/diet"    Past Surgical History:  Procedure Laterality Date  . APPENDECTOMY  1955  . BREAST CYST EXCISION  ~ 1950   right  . BREAST LUMPECTOMY WITH NEEDLE LOCALIZATION Bilateral 02/03/2014   Procedure:  BILATERAL BREAST LUMPECTOMY WITH NEEDLE LOCALIZATION ON LEFT;  Surgeon: Excell Seltzer, MD;  Location: [facility];  Service: General;  Laterality: Bilateral;  . CARDIOVASCULAR STRESS TEST  12/09/2004  . CATARACT EXTRACTION W/ INTRAOCULAR LENS  IMPLANT, BILATERAL  1990's  . [place]  . DILATION AND CURETTAGE OF UTERUS  1953  . INSERT / REPLACE / REMOVE PACEMAKER  03/16/2006  . PERMANENT PACEMAKER GENERATOR CHANGE  08/03/2011   Procedure: PERMANENT PACEMAKER GENERATOR CHANGE;  Surgeon: Thompson Grayer, MD;  Location: [facility] CATH LAB;  Service: Cardiovascular;;  . SKIN CANCER EXCISION  ?1960's   "between my breasts"  . TONSILLECTOMY     "school age"    Social History   Tobacco Use  Smoking Status Never Smoker  Smokeless Tobacco Never Used    Social History   Substance and Sexual Activity  Alcohol Use No    Family History  Problem Relation Age of Onset  . Emphysema Father     Review of Systems: The review of systems is per the HPI.  All other systems were reviewed and are negative.  Physical Exam: BP (!) 146/72   Pulse 78   Ht 5\' 2"  (1.575 m)   Wt 133 lb (60.3 kg)   BMI 24.33 kg/m  GENERAL:  Elderly WF in NAD. HEENT:  PERRL, EOMI, sclera are clear. Oropharynx is clear. NECK:  No jugular venous distention, carotid upstroke brisk and symmetric, no bruits, no thyromegaly or adenopathy LUNGS:  Clear to auscultation bilaterally CHEST:  Unremarkable HEART:  RRR,  PMI not displaced or sustained,S1 and S2 within normal limits, no S3, no S4: no clicks, no rubs, no murmurs ABD:  Soft, nontender. BS +, no masses or bruits. No hepatomegaly, no splenomegaly EXT:  2 + pulses throughout, no edema, no cyanosis no clubbing SKIN:  Warm and dry.  No rashes NEURO:  Alert and oriented x 3. Cranial nerves II through XII intact. PSYCH:  Cognitively intact   LABORATORY DATA:  Lab Results  Component Value Date   WBC 10.2 11/24/2016   HGB 13.2 11/24/2016   HCT 40.0 11/24/2016   PLT  198 11/24/2016   GLUCOSE 137 (H) 11/24/2016   ALT 15 11/24/2016   AST 16 11/24/2016   NA 140 11/24/2016   K 4.2 11/24/2016   CL 106 11/24/2016   CREATININE 0.72 11/24/2016   BUN 20 11/24/2016   CO2 27 11/24/2016   TSH 2.559 11/24/2016   INR 1.41 07/06/2012   HGBA1C 7.6 (H) 06/28/2015   Labs dated 05/27/15: cholesterol 160, triglycerides 57, HDL 44, LDL 105. A1c 03/09/16: 6.9%.  Dated 06/21/16: cholesterol 158, triglycerides 52, HDL 46, LDL 102. A1c 7.2%. Dated 04/11/17: A1c 7.8%. CBC and CMET normal.  Echo 08/30/16: Study Conclusions  - Left ventricle: The cavity size was normal. Wall thickness was   increased in a pattern of moderate LVH. There was severe   assymetric focal basal hypertrophy of the septum. Systolic   function was vigorous. The estimated ejection fraction was in the   range of 65% to 70%. Wall motion was normal; there were no   regional wall motion abnormalities. Doppler parameters are   consistent with abnormal left ventricular relaxation (grade 1   diastolic dysfunction). The E/e&' ratio is >15, suggesting   elevated LV filling pressure. - Aortic valve: Sclerosis without stenosis. There was mild   regurgitation. - Mitral valve: Mildly thickened leaflets . There was trivial   regurgitation. - Left atrium: The atrium was normal in size. - Tricuspid valve: There was trivial regurgitation. - Pulmonary arteries: PA peak pressure: 30 mm Hg (S). - Inferior vena cava: The vessel was normal in size. The   respirophasic diameter changes were in the normal range (>= 50%),   consistent with normal central venous pressure.  Impressions:  - LVEF 65-70%, moderate LVH with severe assymetric proximal septal   thickening (no LVOT obstruction at rest), normal wall motion,   grade 1 DD with elevated LV filling pressure, aortic valve   sclerosis with mild AI, trivial MR, normal LA Size, trivial TR,   RVSP 30 mmHg, normal IVC.  Assessment / Plan:  1. PAF - Remains on   Eliquis and beta blocker. She is maintaining sinus rhythm pretty well. She is asymptomatic.   2. HTN - BP under excellent control. Continue current therapy  3. Chronic Diastolic HF - well compensated today. No edema and weight is stable.    I will follow up in one year

## 2017-05-24 ENCOUNTER — Ambulatory Visit (INDEPENDENT_AMBULATORY_CARE_PROVIDER_SITE_OTHER): Payer: Medicare Other | Admitting: Cardiology

## 2017-05-24 ENCOUNTER — Encounter: Payer: Self-pay | Admitting: Cardiology

## 2017-05-24 VITALS — BP 146/72 | HR 78 | Ht 62.0 in | Wt 133.0 lb

## 2017-05-24 DIAGNOSIS — I495 Sick sinus syndrome: Secondary | ICD-10-CM | POA: Diagnosis not present

## 2017-05-24 DIAGNOSIS — I480 Paroxysmal atrial fibrillation: Secondary | ICD-10-CM

## 2017-05-24 DIAGNOSIS — I10 Essential (primary) hypertension: Secondary | ICD-10-CM | POA: Diagnosis not present

## 2017-05-24 DIAGNOSIS — I5032 Chronic diastolic (congestive) heart failure: Secondary | ICD-10-CM | POA: Diagnosis not present

## 2017-05-24 MED ORDER — LINAGLIPTIN 5 MG PO TABS: 5.0000 mg | ORAL_TABLET | Freq: Every day | ORAL | Status: DC

## 2017-05-24 NOTE — Patient Instructions (Addendum)
Continue your current therapy  I will see you in one year   

## 2017-06-04 ENCOUNTER — Other Ambulatory Visit: Payer: Self-pay

## 2017-06-04 ENCOUNTER — Encounter (HOSPITAL_COMMUNITY): Payer: Self-pay

## 2017-06-04 ENCOUNTER — Emergency Department (HOSPITAL_COMMUNITY): Payer: Medicare Other

## 2017-06-04 ENCOUNTER — Inpatient Hospital Stay (HOSPITAL_COMMUNITY)
Admission: EM | Admit: 2017-06-04 | Discharge: 2017-06-07 | DRG: 308 | Disposition: A | Discharge status: Skilled Nursing Facility | Payer: Medicare Other | Attending: Internal Medicine | Admitting: Internal Medicine

## 2017-06-04 DIAGNOSIS — Z9841 Cataract extraction status, right eye: Secondary | ICD-10-CM

## 2017-06-04 DIAGNOSIS — I480 Paroxysmal atrial fibrillation: Secondary | ICD-10-CM | POA: Diagnosis present

## 2017-06-04 DIAGNOSIS — Z85828 Personal history of other malignant neoplasm of skin: Secondary | ICD-10-CM

## 2017-06-04 DIAGNOSIS — F329 Major depressive disorder, single episode, unspecified: Secondary | ICD-10-CM | POA: Diagnosis present

## 2017-06-04 DIAGNOSIS — I5031 Acute diastolic (congestive) heart failure: Secondary | ICD-10-CM | POA: Diagnosis not present

## 2017-06-04 DIAGNOSIS — F410 Panic disorder [episodic paroxysmal anxiety] without agoraphobia: Secondary | ICD-10-CM | POA: Diagnosis present

## 2017-06-04 DIAGNOSIS — M19072 Primary osteoarthritis, left ankle and foot: Secondary | ICD-10-CM | POA: Diagnosis present

## 2017-06-04 DIAGNOSIS — M2550 Pain in unspecified joint: Secondary | ICD-10-CM | POA: Diagnosis not present

## 2017-06-04 DIAGNOSIS — I4891 Unspecified atrial fibrillation: Secondary | ICD-10-CM | POA: Diagnosis present

## 2017-06-04 DIAGNOSIS — Z950 Presence of cardiac pacemaker: Secondary | ICD-10-CM | POA: Diagnosis not present

## 2017-06-04 DIAGNOSIS — J90 Pleural effusion, not elsewhere classified: Secondary | ICD-10-CM | POA: Diagnosis not present

## 2017-06-04 DIAGNOSIS — I351 Nonrheumatic aortic (valve) insufficiency: Secondary | ICD-10-CM | POA: Diagnosis not present

## 2017-06-04 DIAGNOSIS — M19071 Primary osteoarthritis, right ankle and foot: Secondary | ICD-10-CM | POA: Diagnosis present

## 2017-06-04 DIAGNOSIS — E785 Hyperlipidemia, unspecified: Secondary | ICD-10-CM | POA: Diagnosis present

## 2017-06-04 DIAGNOSIS — M19041 Primary osteoarthritis, right hand: Secondary | ICD-10-CM | POA: Diagnosis present

## 2017-06-04 DIAGNOSIS — M479 Spondylosis, unspecified: Secondary | ICD-10-CM | POA: Diagnosis present

## 2017-06-04 DIAGNOSIS — M6281 Muscle weakness (generalized): Secondary | ICD-10-CM | POA: Diagnosis not present

## 2017-06-04 DIAGNOSIS — Z66 Do not resuscitate: Secondary | ICD-10-CM | POA: Diagnosis present

## 2017-06-04 DIAGNOSIS — R0902 Hypoxemia: Secondary | ICD-10-CM

## 2017-06-04 DIAGNOSIS — Z9842 Cataract extraction status, left eye: Secondary | ICD-10-CM

## 2017-06-04 DIAGNOSIS — Z961 Presence of intraocular lens: Secondary | ICD-10-CM | POA: Diagnosis present

## 2017-06-04 DIAGNOSIS — M1990 Unspecified osteoarthritis, unspecified site: Secondary | ICD-10-CM | POA: Diagnosis present

## 2017-06-04 DIAGNOSIS — Z7401 Bed confinement status: Secondary | ICD-10-CM | POA: Diagnosis not present

## 2017-06-04 DIAGNOSIS — I481 Persistent atrial fibrillation: Secondary | ICD-10-CM | POA: Diagnosis present

## 2017-06-04 DIAGNOSIS — I5033 Acute on chronic diastolic (congestive) heart failure: Secondary | ICD-10-CM | POA: Diagnosis not present

## 2017-06-04 DIAGNOSIS — K5900 Constipation, unspecified: Secondary | ICD-10-CM | POA: Diagnosis present

## 2017-06-04 DIAGNOSIS — R278 Other lack of coordination: Secondary | ICD-10-CM | POA: Diagnosis not present

## 2017-06-04 DIAGNOSIS — I110 Hypertensive heart disease with heart failure: Secondary | ICD-10-CM | POA: Diagnosis present

## 2017-06-04 DIAGNOSIS — R2681 Unsteadiness on feet: Secondary | ICD-10-CM | POA: Diagnosis not present

## 2017-06-04 DIAGNOSIS — M19042 Primary osteoarthritis, left hand: Secondary | ICD-10-CM | POA: Diagnosis present

## 2017-06-04 DIAGNOSIS — M810 Age-related osteoporosis without current pathological fracture: Secondary | ICD-10-CM | POA: Diagnosis present

## 2017-06-04 DIAGNOSIS — R0602 Shortness of breath: Secondary | ICD-10-CM | POA: Diagnosis not present

## 2017-06-04 DIAGNOSIS — I361 Nonrheumatic tricuspid (valve) insufficiency: Secondary | ICD-10-CM | POA: Diagnosis not present

## 2017-06-04 DIAGNOSIS — J9601 Acute respiratory failure with hypoxia: Secondary | ICD-10-CM | POA: Diagnosis present

## 2017-06-04 DIAGNOSIS — F419 Anxiety disorder, unspecified: Secondary | ICD-10-CM | POA: Diagnosis not present

## 2017-06-04 DIAGNOSIS — Z882 Allergy status to sulfonamides status: Secondary | ICD-10-CM

## 2017-06-04 DIAGNOSIS — G8911 Acute pain due to trauma: Secondary | ICD-10-CM | POA: Diagnosis not present

## 2017-06-04 DIAGNOSIS — E119 Type 2 diabetes mellitus without complications: Secondary | ICD-10-CM | POA: Diagnosis not present

## 2017-06-04 DIAGNOSIS — I5032 Chronic diastolic (congestive) heart failure: Secondary | ICD-10-CM | POA: Diagnosis not present

## 2017-06-04 DIAGNOSIS — Z7901 Long term (current) use of anticoagulants: Secondary | ICD-10-CM

## 2017-06-04 DIAGNOSIS — Z888 Allergy status to other drugs, medicaments and biological substances status: Secondary | ICD-10-CM

## 2017-06-04 DIAGNOSIS — Z79899 Other long term (current) drug therapy: Secondary | ICD-10-CM

## 2017-06-04 DIAGNOSIS — Z7984 Long term (current) use of oral hypoglycemic drugs: Secondary | ICD-10-CM

## 2017-06-04 DIAGNOSIS — J302 Other seasonal allergic rhinitis: Secondary | ICD-10-CM | POA: Diagnosis not present

## 2017-06-04 DIAGNOSIS — Z87442 Personal history of urinary calculi: Secondary | ICD-10-CM

## 2017-06-04 LAB — CBC WITH DIFFERENTIAL/PLATELET
BASOS ABS: 0 10*3/uL (ref 0.0–0.1)
Basophils Relative: 0 %
Eosinophils Absolute: 0 10*3/uL (ref 0.0–0.7)
Eosinophils Relative: 0 %
HEMATOCRIT: 32.7 % — AB (ref 36.0–46.0)
Hemoglobin: 10.8 g/dL — ABNORMAL LOW (ref 12.0–15.0)
LYMPHS ABS: 1.7 10*3/uL (ref 0.7–4.0)
Lymphocytes Relative: 11 %
MCH: 31.1 pg (ref 26.0–34.0)
MCHC: 33 g/dL (ref 30.0–36.0)
MCV: 94.2 fL (ref 78.0–100.0)
MONO ABS: 1.2 10*3/uL — AB (ref 0.1–1.0)
MONOS PCT: 8 %
NEUTROS ABS: 12.2 10*3/uL — AB (ref 1.7–7.7)
Neutrophils Relative %: 81 %
PLATELETS: 217 10*3/uL (ref 150–400)
RBC: 3.47 MIL/uL — ABNORMAL LOW (ref 3.87–5.11)
RDW: 13.5 % (ref 11.5–15.5)
WBC: 15.1 10*3/uL — AB (ref 4.0–10.5)

## 2017-06-04 LAB — GLUCOSE, CAPILLARY: Glucose-Capillary: 175 mg/dL — ABNORMAL HIGH (ref 65–99)

## 2017-06-04 LAB — PROCALCITONIN: Procalcitonin: <0.1 ng/mL

## 2017-06-04 LAB — BASIC METABOLIC PANEL
Anion gap: 7 (ref 5–15)
BUN: 17 mg/dL (ref 6–20)
CO2: 26 mmol/L (ref 22–32)
CREATININE: 0.75 mg/dL (ref 0.44–1.00)
Calcium: 9 mg/dL (ref 8.9–10.3)
Chloride: 104 mmol/L (ref 101–111)
GFR calc Af Amer: >60 mL/min (ref >60)
GLUCOSE: 207 mg/dL — AB (ref 65–99)
Potassium: 4.1 mmol/L (ref 3.5–5.1)
SODIUM: 137 mmol/L (ref 135–145)

## 2017-06-04 LAB — BRAIN NATRIURETIC PEPTIDE: B NATRIURETIC PEPTIDE 5: 351.1 pg/mL — AB (ref 0.0–100.0)

## 2017-06-04 LAB — I-STAT TROPONIN, ED: Troponin i, poc: 0.01 ng/mL (ref 0.00–0.08)

## 2017-06-04 MED ORDER — SODIUM CHLORIDE 0.9% FLUSH: 3.0000 mL | INTRAVENOUS | Status: DC | PRN

## 2017-06-04 MED ORDER — ACETAMINOPHEN 325 MG PO TABS: 650.0000 mg | ORAL_TABLET | ORAL | Status: DC | PRN

## 2017-06-04 MED ORDER — POLYETHYLENE GLYCOL 3350 17 G PO PACK
17.0000 g | PACK | Freq: Every day | ORAL | Status: DC | PRN
  Administered 2017-06-05: 17 g via ORAL
  Filled 2017-06-04: qty 1

## 2017-06-04 MED ORDER — AZELASTINE HCL 0.1 % NA SOLN
1.0000 | Freq: Every day | NASAL | Status: DC | PRN
  Filled 2017-06-04: qty 30

## 2017-06-04 MED ORDER — FUROSEMIDE 10 MG/ML IJ SOLN
40.0000 mg | Freq: Two times a day (BID) | INTRAMUSCULAR | Status: DC
  Administered 2017-06-05: 40 mg via INTRAVENOUS
  Filled 2017-06-04: qty 4

## 2017-06-04 MED ORDER — VITAMIN D (ERGOCALCIFEROL) 1.25 MG (50000 UNIT) PO CAPS
50000.0000 [IU] | ORAL_CAPSULE | ORAL | Status: DC
  Filled 2017-06-04: qty 1

## 2017-06-04 MED ORDER — ACETAMINOPHEN 500 MG PO TABS: 500.0000 mg | ORAL_TABLET | Freq: Four times a day (QID) | ORAL | Status: DC | PRN

## 2017-06-04 MED ORDER — LORAZEPAM 2 MG/ML IJ SOLN
0.2500 mg | Freq: Two times a day (BID) | INTRAMUSCULAR | Status: DC
  Filled 2017-06-04: qty 1

## 2017-06-04 MED ORDER — APIXABAN 2.5 MG PO TABS
2.5000 mg | ORAL_TABLET | Freq: Two times a day (BID) | ORAL | Status: DC
  Administered 2017-06-04 – 2017-06-07 (×6): 2.5 mg via ORAL
  Filled 2017-06-04 (×6): qty 1

## 2017-06-04 MED ORDER — DILTIAZEM HCL 25 MG/5ML IV SOLN
20.0000 mg | Freq: Once | INTRAVENOUS | Status: AC
  Administered 2017-06-04: 20 mg via INTRAVENOUS
  Filled 2017-06-04: qty 5

## 2017-06-04 MED ORDER — FUROSEMIDE 10 MG/ML IJ SOLN
40.0000 mg | Freq: Once | INTRAMUSCULAR | Status: AC
  Administered 2017-06-04: 40 mg via INTRAVENOUS
  Filled 2017-06-04: qty 4

## 2017-06-04 MED ORDER — DILTIAZEM HCL ER COATED BEADS 240 MG PO CP24
240.0000 mg | ORAL_CAPSULE | Freq: Every day | ORAL | Status: DC
  Administered 2017-06-04 – 2017-06-07 (×4): 240 mg via ORAL
  Filled 2017-06-04 (×4): qty 1

## 2017-06-04 MED ORDER — DILTIAZEM HCL-DEXTROSE 100-5 MG/100ML-% IV SOLN (PREMIX)
5.0000 mg/h | Freq: Once | INTRAVENOUS | Status: AC
  Administered 2017-06-05: 5 mg/h via INTRAVENOUS
  Filled 2017-06-04: qty 100

## 2017-06-04 MED ORDER — ROSUVASTATIN CALCIUM 5 MG PO TABS
2.5000 mg | ORAL_TABLET | ORAL | Status: DC
  Administered 2017-06-05 – 2017-06-07 (×2): 2.5 mg via ORAL
  Filled 2017-06-04 (×2): qty 0.5

## 2017-06-04 MED ORDER — SODIUM CHLORIDE 0.9% FLUSH
3.0000 mL | Freq: Two times a day (BID) | INTRAVENOUS | Status: DC
Start: 2017-06-04 — End: 2017-06-07
  Administered 2017-06-04 – 2017-06-06 (×4): 3 mL via INTRAVENOUS

## 2017-06-04 MED ORDER — ONDANSETRON HCL 4 MG/2ML IJ SOLN: 4.0000 mg | Freq: Four times a day (QID) | INTRAMUSCULAR | Status: DC | PRN

## 2017-06-04 MED ORDER — CARVEDILOL 6.25 MG PO TABS
6.2500 mg | ORAL_TABLET | Freq: Two times a day (BID) | ORAL | Status: DC
  Administered 2017-06-04 – 2017-06-07 (×6): 6.25 mg via ORAL
  Filled 2017-06-04 (×4): qty 1
  Filled 2017-06-04: qty 2
  Filled 2017-06-04: qty 1

## 2017-06-04 MED ORDER — DILTIAZEM HCL-DEXTROSE 100-5 MG/100ML-% IV SOLN (PREMIX)
5.0000 mg/h | Freq: Once | INTRAVENOUS | Status: AC
  Administered 2017-06-04: 5 mg/h via INTRAVENOUS
  Filled 2017-06-04: qty 100

## 2017-06-04 MED ORDER — FUROSEMIDE 10 MG/ML IJ SOLN: 40.0000 mg | Freq: Two times a day (BID) | INTRAMUSCULAR | Status: DC

## 2017-06-04 MED ORDER — AMOXICILLIN-POT CLAVULANATE 875-125 MG PO TABS
1.0000 | ORAL_TABLET | Freq: Two times a day (BID) | ORAL | Status: DC
  Administered 2017-06-04 – 2017-06-06 (×4): 1 via ORAL
  Filled 2017-06-04 (×4): qty 1

## 2017-06-04 MED ORDER — SODIUM CHLORIDE 0.9 % IV SOLN: 250.0000 mL | INTRAVENOUS | Status: DC | PRN

## 2017-06-04 MED ORDER — INSULIN ASPART 100 UNIT/ML ~~LOC~~ SOLN
0.0000 [IU] | Freq: Three times a day (TID) | SUBCUTANEOUS | Status: DC
  Administered 2017-06-05: 5 [IU] via SUBCUTANEOUS
  Administered 2017-06-06 – 2017-06-07 (×5): 2 [IU] via SUBCUTANEOUS

## 2017-06-04 MED ORDER — SERTRALINE HCL 50 MG PO TABS
50.0000 mg | ORAL_TABLET | Freq: Every day | ORAL | Status: DC
  Administered 2017-06-05 – 2017-06-07 (×3): 50 mg via ORAL
  Filled 2017-06-04 (×3): qty 1

## 2017-06-04 MED ORDER — LORAZEPAM 0.5 MG PO TABS
0.5000 mg | ORAL_TABLET | Freq: Every day | ORAL | Status: DC
  Administered 2017-06-04 – 2017-06-06 (×3): 0.5 mg via ORAL
  Filled 2017-06-04 (×3): qty 1

## 2017-06-04 NOTE — ED Provider Notes (Signed)
[facility] EMERGENCY DEPARTMENT Provider Note   CSN: [id number] Arrival date & time: 06/04/17  1153     History   Chief Complaint Chief Complaint  Patient presents with  . Shortness of Breath    HPI Jo [name] is a 84 y.o. female with a past medical history of CHF, hypertension, diabetes, atrial fibrillation, pacemaker in place, currently anticoagulated on Eliquis, who presents to ED for evaluation of shortness of breath.  Began having shortness of breath approximately 10 hours ago.  She then called her son a few hours later who then called EMS.  EMS found her with oxygen saturations at 86% on room air.  Patient does not use home oxygen.  She was put on a nonrebreather and saturations improved to 97%.  She also has been complaining of a productive cough with green mucus for the past several days as well as bilateral conjunctivitis symptoms, nasal congestion.  She reports compliance with her home medications.  She denies any chest pain, abdominal pain, vomiting, headache or vision changes, injuries or falls, hemoptysis, fever.  Patient resides at home by herself.  Ambulates with a walker.  Her son lives in [place].   HPI  Past Medical History:  Diagnosis Date  . Anxiety   . Arthritis    "hands; between shoulders; back; feet"  . Chronic anticoagulation    -->Eliquis  . Chronic diastolic CHF (congestive heart failure) ([place])    a. 06/2011 Echo: EF 60-65%.  . Depression   . Essential hypertension   . High risk medication use    on Flecainide  . Kidney stone    passed in [place] 2014  . Moderate aortic insufficiency    a. 06/2011 Echo: EF 60-65%, mild LVH, no rwma, mod AI.  Marland Kitchen Osteoporosis   . Osteoporosis   . PAF (paroxysmal atrial fibrillation) (HCC)    a. chronic flecainide->reduced to 25 bid 08/2014.  Marland Kitchen Panic attacks    "since losing husband 1994"  . Skin cancer ?1960's   "between breasts"  . SSS (sick sinus syndrome) (HCC)    a. s/p pacemaker. b. s/p  Medtronic gen change 07/2011; c.   . Type II diabetes mellitus ([place])    "controlled w/diet"    Patient Active Problem List   Diagnosis Date Noted  . UTI (urinary tract infection) 08/30/2016  . Weakness 08/29/2016  . Acute lower UTI 08/29/2016  . Abdominal distention   . CAP (community acquired pneumonia) 06/27/2015  . Sepsis ([place]) 06/27/2015  . Pacemaker 06/27/2015  . Moderate aortic insufficiency   . Chronic diastolic CHF (congestive heart failure) ([place])   . Breast cancer ([place]) 02/03/2014  . Dyslipidemia 08/15/2012  . Atrophic vaginitis 08/15/2012  . Constipation 08/15/2012  . Vitamin D intoxication 08/15/2012  . Gram negative septicemia ([place]) 07/07/2012  . Acute on chronic diastolic HF (heart failure) ([place]) 07/06/2012  . Atrial fibrillation with RVR ([place]) 07/21/2011  . Diabetes mellitus, type II ([place])   . Panic attacks   . Anxiety   . PAF (paroxysmal atrial fibrillation) ([place])   . SSS (sick sinus syndrome) ([place])   . History of aortic insufficiency   . Hypertension     Past Surgical History:  Procedure Laterality Date  . APPENDECTOMY  1955  . BREAST CYST EXCISION  ~ 1950   right  . BREAST LUMPECTOMY WITH NEEDLE LOCALIZATION Bilateral 02/03/2014   Procedure: BILATERAL BREAST LUMPECTOMY WITH NEEDLE LOCALIZATION ON LEFT;  Surgeon: Excell Seltzer, MD;  Location: [facility];  Service: General;  Laterality: Bilateral;  . CARDIOVASCULAR STRESS TEST  12/09/2004  . CATARACT EXTRACTION W/ INTRAOCULAR LENS  IMPLANT, BILATERAL  1990's  . [place]  . DILATION AND CURETTAGE OF UTERUS  1953  . INSERT / REPLACE / REMOVE PACEMAKER  03/16/2006  . PERMANENT PACEMAKER GENERATOR CHANGE  08/03/2011   Procedure: PERMANENT PACEMAKER GENERATOR CHANGE;  Surgeon: Thompson Grayer, MD;  Location: [facility] CATH LAB;  Service: Cardiovascular;;  . SKIN CANCER EXCISION  ?1960's   "between my breasts"  . TONSILLECTOMY     "school age"     OB History   None      Home Medications    Prior to  Admission medications   Medication Sig Start Date End Date Taking? Authorizing Provider  acetaminophen (TYLENOL) 500 MG tablet Take 500-1,000 mg by mouth every 6 (six) hours as needed for mild pain, moderate pain, fever or headache.    [provider]  apixaban (ELIQUIS) 2.5 MG TABS tablet Take 1 tablet (2.5 mg total) by mouth 2 (two) times daily. 01/28/14   Martinique, Peter M, MD  azelastine (ASTELIN) 0.1 % nasal spray Place 1 spray into both nostrils daily as needed for rhinitis. Use in each nostril as directed    [provider]  carvedilol (COREG) 6.25 MG tablet Take 1 tablet (6.25 mg total) 2 (two) times daily by mouth. 12/01/16   Sherran Needs, NP  carvedilol (COREG) 6.25 MG tablet Take 1 tablet (6.25 mg total) by mouth 2 (two) times daily with a meal. 05/22/17   Sherran Needs, NP  CRESTOR 5 MG tablet Take 2.5 mg by mouth every Monday, Wednesday, and Friday.  07/25/14   [provider]  diltiazem (CARDIZEM CD) 240 MG 24 hr capsule Take 240 mg by mouth daily.  09/16/14   [provider]  estradiol (ESTRACE) 0.1 MG/GM vaginal cream Place 1 Applicatorful vaginally every Monday, Wednesday, and Friday.    [provider]  Flaxseed, Linseed, (FLAX SEEDS PO) Take 2.5 mLs by mouth daily with breakfast.    [provider]  linagliptin (TRADJENTA) 5 MG TABS tablet Take 1 tablet (5 mg total) by mouth daily. 05/24/17   [place]ique, Peter M, MD  LORazepam (ATIVAN) 0.5 MG tablet Take 0.25-0.5 mg by mouth 3 (three) times daily. Take 0.25 mg tablet in the morning and afternoon, then 0.5 mg at bedtime    [provider]  polyethylene glycol (MIRALAX / GLYCOLAX) packet Take 17 g by mouth daily as needed for mild constipation.     [provider]  sertraline (ZOLOFT) 50 MG tablet Take 50 mg by mouth daily. 07/20/14   [provider]  Vitamin D, Ergocalciferol, (DRISDOL) 50000 UNITS CAPS capsule Take 50,000 Units by mouth every Wednesday.     [provider]    Family History Family History  Problem Relation Age of Onset  . Emphysema Father     Social History Social History   Tobacco Use  . Smoking status: Never Smoker  . Smokeless tobacco: Never Used  Substance Use Topics  . Alcohol use: No  . Drug use: No     Allergies   Sulfonamide derivatives and Citalopram hydrobromide   Review of Systems Review of Systems  Constitutional: Negative for appetite change, chills and fever.  HENT: Negative for ear pain, rhinorrhea, sneezing and sore throat.   Eyes: Negative for photophobia and visual disturbance.  Respiratory: Positive for cough and shortness of breath. Negative for chest tightness  and wheezing.   Cardiovascular: Negative for chest pain and palpitations.  Gastrointestinal: Negative for abdominal pain, blood in stool, constipation, diarrhea, nausea and vomiting.  Genitourinary: Negative for dysuria, hematuria and urgency.  Musculoskeletal: Negative for myalgias.  Skin: Negative for rash.  Neurological: Negative for dizziness, weakness and light-headedness.     Physical Exam Updated Vital Signs BP 122/66   Pulse 80   Temp 98.4 F (36.9 C) (Oral)   Resp (!) 28   Ht 5\' 2"  (1.575 m)   Wt 60.3 kg (133 lb)   SpO2 94%   BMI 24.33 kg/m   Physical Exam  Constitutional: She appears well-developed and well-nourished. No distress.  HENT:  Head: Normocephalic and atraumatic.  Right Ear: Tympanic membrane normal.  Left Ear: Tympanic membrane normal.  Nose: Nose normal.  Mouth/Throat: Uvula is midline and oropharynx is clear and moist.  Eyes: Pupils are equal, round, and reactive to light. EOM are normal. Right eye exhibits discharge. Left eye exhibits discharge. Right conjunctiva is injected. Left conjunctiva is injected. No scleral icterus.  Purulent eye drainage bilaterally.  Neck: Normal range of motion. Neck supple.  Cardiovascular: Normal rate, regular rhythm, normal heart sounds and intact  distal pulses. Exam reveals no gallop and no friction rub.  No murmur heard. Pulmonary/Chest: Effort normal and breath sounds normal. No respiratory distress.  Coarse breath sounds in bilateral middle and lower lung fields.  Abdominal: Soft. Bowel sounds are normal. She exhibits no distension. There is no tenderness. There is no guarding.  Musculoskeletal: Normal range of motion. She exhibits no edema.  No lower extremity edema, erythema or calf tenderness bilaterally.  Equal grip strength bilaterally.  Neurological: She is alert. No cranial nerve deficit or sensory deficit. She exhibits normal muscle tone. Coordination normal.  Alert and oriented to self.  Tells me that it is Sunday, [place] of 2020 (it is Sunday, [place] of 2019). Able to recall her son's name and where he resides.  Able to recall that she is at [facility] in [place]. Pupils reactive. No facial asymmetry noted. Cranial nerves appear grossly intact. Sensation intact to light touch on face.  Skin: Skin is warm and dry. No rash noted.  Psychiatric: She has a normal mood and affect.  Nursing note and vitals reviewed.    ED Treatments / Results  Labs (all labs ordered are listed, but only abnormal results are displayed) Labs Reviewed  BASIC METABOLIC PANEL - Abnormal; Notable for the following components:      Result Value   Glucose, Bld 207 (*)    All other components within normal limits  CBC WITH DIFFERENTIAL/PLATELET - Abnormal; Notable for the following components:   WBC 15.1 (*)    RBC 3.47 (*)    Hemoglobin 10.8 (*)    HCT 32.7 (*)    Neutro Abs 12.2 (*)    Monocytes Absolute 1.2 (*)    All other components within normal limits  BRAIN NATRIURETIC PEPTIDE  I-STAT TROPONIN, ED    EKG EKG Interpretation  Date/Time:  Sunday Goers 12 2019 12:07:06 EDT Ventricular Rate:  96 PR Interval:    QRS Duration: 106 QT Interval:  399 QTC Calculation: 469 R Axis:   50 Text Interpretation:  Atrial flutter Inferior  infarct, acute (RCA) Probable RV involvement, suggest recording right precordial leads Confirmed by Isla Pence [phone]) on 06/04/2017 12:15:09 PM   Radiology Dg Chest 2 View  Result Date: 06/04/2017 CLINICAL DATA:  84 year old female with a history of shortness of breath  EXAM: CHEST - 2 VIEW COMPARISON:  08/29/2016 FINDINGS: Cardiomediastinal silhouette unchanged in size and contour. Increasing interlobular septal thickening and patchy airspace opacity/interstitial opacity. Low lung volumes persist. Unchanged cardiac pacing device with 2 leads in place. No pneumothorax. Lateral view demonstrates blunting of the costophrenic sulcus. IMPRESSION: Chest x-ray demonstrates developing CHF with small pleural effusion. Electronically Signed   By: Corrie Mckusick D.O.   On: 06/04/2017 12:40    Procedures Procedures (including critical care time)  CRITICAL CARE Performed by: Delia Heady   Total critical care time: 35 minutes  Critical care time was exclusive of separately billable procedures and treating other patients.  Critical care was necessary to treat or prevent imminent or life-threatening deterioration.  Critical care was time spent personally by me on the following activities: development of treatment plan with patient and/or surrogate as well as nursing, discussions with consultants, evaluation of patient's response to treatment, examination of patient, obtaining history from patient or surrogate, ordering and performing treatments and interventions, ordering and review of laboratory studies, ordering and review of radiographic studies, pulse oximetry and re-evaluation of patient's condition.   Medications Ordered in ED Medications  diltiazem (CARDIZEM) 100 mg in dextrose 5% 141mL (1 mg/mL) infusion (has no administration in time range)  furosemide (LASIX) injection 40 mg (has no administration in time range)  diltiazem (CARDIZEM) injection 20 mg (20 mg Intravenous Given 06/04/17 1325)       Initial Impression / Assessment and Plan / ED Course  I have reviewed the triage vital signs and the nursing notes.  Pertinent labs & imaging results that were available during my care of the patient were reviewed by me and considered in my medical decision making (see chart for details).     Patient presents to ED for evaluation of shortness of breath since earlier this morning.  Was initially 86% on room air and has now improved with oxygen via nasal cannula.  Lab work and imaging findings significant for mild CHF exacerbation on chest x-ray.  EKG within normal limits.  Troponin negative.  Patient's heart rate elevated to 130s during ED visit.  Improved with Cardizem bolus however then elevated back to 130s.  Will begin on Cardizem drip and admit to hospitalist for further management. Patient discussed with and seen by my attending, Dr. Gilford Raid.  Portions of this note were generated with Lobbyist. Dictation errors [place] occur despite best attempts at proofreading.   Final Clinical Impressions(s) / ED Diagnoses   Final diagnoses:  None    ED Discharge Orders    None       Delia Heady, PA-C 06/04/17 1406    Isla Pence, MD 06/04/17 1433

## 2017-06-04 NOTE — ED Notes (Signed)
ED Provider at bedside. 

## 2017-06-04 NOTE — Progress Notes (Addendum)
Questioned PO Cardizem 24 hour while on IV Cardizem.  Dr. Silas [place] allows to give both and reordered med as it had time expired from earlier order placed.

## 2017-06-04 NOTE — ED Notes (Signed)
BNP added to Blood in main lab.

## 2017-06-04 NOTE — ED Notes (Signed)
Patient transported to X-ray 

## 2017-06-04 NOTE — H&P (Signed)
History and Physical    Jo [name] MRN:[id number] DOB: [birth year] DOA: 06/04/2017 Shortness of breath PCP: Crist Infante, MD   Patient coming from: Home   Chief Complaint: Shortness of breath  HPI: Jo [name] is a 84 y.o. female with medical history significant of paroxysmal atrial fibrillation and sick sinus syndrome status post pacer Medtronic.  Chronic diastolic heart failure grade 1 diastolic dysfunction by echo on 08/30/2016, hypertension, type 2 diabetes on oral hypoglycemic medications, hyperlipidemia who comes in with shortness of breath.  She reports that approximately 3 days ago she began to notice intermittent episodes of shortness of breath.  She also noted increasing orthopnea and episodes of paroxysmal nocturnal dyspnea.  She began to have episodes of exertional dyspnea.  She denies any palpitations, chest pain, nausea, vomiting, diarrhea during this time.  Her family called her PCP yesterday about some redness in the eyes and some cough that she developed over the past 2 weeks and she was prescribed Augmentin for possible sinus infection.  She denies any syncope or presyncope.  ED Course: In the ED patient was noted to be mildly hypoxic requiring 2 L nasal cannula.  Patient also had an episode of A. fib with RVR requiring 20 mg IV diltiazem.  This worked initially but then she again flipped into RVR and was placed on diltiazem drip.  Chest x-ray showed mild pulmonary congestion consistent with heart failure exacerbation and patient was given 40 mg IV furosemide.  Labs are notable for white blood cell count of 15.1, hemoglobin of 10.8 and hematocrit 32.7, BMP was unremarkable except for glucose of 207.  Review of Systems: As per HPI otherwise 10 point review of systems negative.    Past Medical History:  Diagnosis Date  . Anxiety   . Arthritis    "hands; between shoulders; back; feet"  . Chronic anticoagulation    -->Eliquis  . Chronic diastolic CHF (congestive heart failure)  ([place])    a. 06/2011 Echo: EF 60-65%.  . Depression   . Essential hypertension   . High risk medication use    on Flecainide  . Kidney stone    passed in [place] 2014  . Moderate aortic insufficiency    a. 06/2011 Echo: EF 60-65%, mild LVH, no rwma, mod AI.  Marland Kitchen Osteoporosis   . Osteoporosis   . PAF (paroxysmal atrial fibrillation) (HCC)    a. chronic flecainide->reduced to 25 bid 08/2014.  Marland Kitchen Panic attacks    "since losing husband 1994"  . Skin cancer ?1960's   "between breasts"  . SSS (sick sinus syndrome) (HCC)    a. s/p pacemaker. b. s/p Medtronic gen change 07/2011; c.   . Type II diabetes mellitus ([place])    "controlled w/diet"    Past Surgical History:  Procedure Laterality Date  . APPENDECTOMY  1955  . BREAST CYST EXCISION  ~ 1950   right  . BREAST LUMPECTOMY WITH NEEDLE LOCALIZATION Bilateral 02/03/2014   Procedure: BILATERAL BREAST LUMPECTOMY WITH NEEDLE LOCALIZATION ON LEFT;  Surgeon: Excell Seltzer, MD;  Location: [facility];  Service: General;  Laterality: Bilateral;  . CARDIOVASCULAR STRESS TEST  12/09/2004  . CATARACT EXTRACTION W/ INTRAOCULAR LENS  IMPLANT, BILATERAL  1990's  . [place]  . DILATION AND CURETTAGE OF UTERUS  1953  . INSERT / REPLACE / REMOVE PACEMAKER  03/16/2006  . PERMANENT PACEMAKER GENERATOR CHANGE  08/03/2011   Procedure: PERMANENT PACEMAKER GENERATOR CHANGE;  Surgeon: Thompson Grayer, MD;  Location: [facility] CATH LAB;  Service: Cardiovascular;;  . SKIN CANCER EXCISION  ?1960's   "between my breasts"  . TONSILLECTOMY     "school age"     reports that she has never smoked. She has never used smokeless tobacco. She reports that she does not drink alcohol or use drugs.  Allergies  Allergen Reactions  . Sulfonamide Derivatives Rash    "it was all over my legs"  . Citalopram Hydrobromide Other (See Comments)    Pt does not remember reaction    Family History  Problem Relation Age of Onset  . Emphysema Father    Unacceptable: Noncontributory,  unremarkable, or negative. Acceptable: Family history reviewed and not pertinent (If you reviewed it)  Prior to Admission medications   Medication Sig Start Date End Date Taking? Authorizing Provider  acetaminophen (TYLENOL) 500 MG tablet Take 500-1,000 mg by mouth every 6 (six) hours as needed for mild pain, moderate pain, fever or headache.   Yes [provider]  apixaban (ELIQUIS) 2.5 MG TABS tablet Take 1 tablet (2.5 mg total) by mouth 2 (two) times daily. 01/28/14  Yes Martinique, Peter M, MD  azelastine (ASTELIN) 0.1 % nasal spray Place 1 spray into both nostrils daily as needed for rhinitis. Use in each nostril as directed   Yes [provider]  carvedilol (COREG) 6.25 MG tablet Take 1 tablet (6.25 mg total) 2 (two) times daily by mouth. 12/01/16  Yes Sherran Needs, NP  CRESTOR 5 MG tablet Take 2.5 mg by mouth every Monday, Wednesday, and Friday.  07/25/14  Yes [provider]  diltiazem (CARDIZEM CD) 240 MG 24 hr capsule Take 240 mg by mouth daily.  09/16/14  Yes [provider]  Flaxseed, Linseed, (FLAX SEEDS PO) Take 2.5 mLs by mouth daily with breakfast.   Yes [provider]  LORazepam (ATIVAN) 0.5 MG tablet Take 0.25-0.5 mg by mouth 3 (three) times daily. Take 0.25 mg tablet in the morning and afternoon, then 0.5 mg at bedtime   Yes [provider]  sertraline (ZOLOFT) 50 MG tablet Take 50 mg by mouth daily. 07/20/14  Yes [provider]  carvedilol (COREG) 6.25 MG tablet Take 1 tablet (6.25 mg total) by mouth 2 (two) times daily with a meal. 05/22/17   Sherran Needs, NP  estradiol (ESTRACE) 0.1 MG/GM vaginal cream Place 1 Applicatorful vaginally every Monday, Wednesday, and Friday.    [provider]  linagliptin (TRADJENTA) 5 MG TABS tablet Take 1 tablet (5 mg total) by mouth daily. 05/24/17   Martinique, Peter M, MD  polyethylene glycol [facility] / Floria Raveling) packet Take 17 g by mouth daily as needed for mild constipation.      [provider]  Vitamin D, Ergocalciferol, (DRISDOL) 50000 UNITS CAPS capsule Take 50,000 Units by mouth every Wednesday.    [provider]    Physical Exam: Vitals:   06/04/17 1328 06/04/17 1330 06/04/17 1347 06/04/17 1353  BP:  122/66    Pulse: 91 75 76 80  Resp: 16 (!) 25 (!) 28 (!) 28  Temp:      TempSrc:      SpO2: 100% 92% 97% 94%  Weight:      Height:        Constitutional: NAD, calm, comfortable Vitals:   06/04/17 1328 06/04/17 1330 06/04/17 1347 06/04/17 1353  BP:  122/66    Pulse: 91 75 76 80  Resp: 16 (!) 25 (!) 28 (!) 28  Temp:      TempSrc:  SpO2: 100% 92% 97% 94%  Weight:      Height:       Eyes: Anicteric sclera, allergic shiners noted bilaterally ENMT: Mucous membranes are moist. Posterior pharynx clear of any exudate or lesions.Normal dentition.  Neck: normal, supple, Respiratory: No increased work of breathing, bibasilar crackles up to mid lung fields bilaterally, no wheezes or rhonchi Cardiovascular: Tachycardic, irregularly irregular, no murmurs.  Abdomen: no tenderness, no masses palpated. No hepatosplenomegaly. Bowel sounds positive.  Musculoskeletal: Trace pedal edema Skin: no rashes on visible skin Neurologic: Grossly intact, moving all extremities Psychiatric: Normal judgment and insight. Alert and oriented x 3. Normal mood.     Labs on Admission: I have personally reviewed following labs and imaging studies  CBC: Recent Labs  Lab 06/04/17 1224  WBC 15.1*  NEUTROABS 12.2*  HGB 10.8*  HCT 32.7*  MCV 94.2  PLT 388   Basic Metabolic Panel: Recent Labs  Lab 06/04/17 1224  NA 137  K 4.1  CL 104  CO2 26  GLUCOSE 207*  BUN 17  CREATININE 0.75  CALCIUM 9.0   GFR: Estimated Creatinine Clearance: 34.4 mL/min (by C-G formula based on SCr of 0.75 mg/dL). Liver Function Tests: No results for input(s): AST, ALT, ALKPHOS, BILITOT, PROT, ALBUMIN in the last 168 hours. No results for input(s): LIPASE, AMYLASE in  the last 168 hours. No results for input(s): AMMONIA in the last 168 hours. Coagulation Profile: No results for input(s): INR, PROTIME in the last 168 hours. Cardiac Enzymes: No results for input(s): CKTOTAL, CKMB, CKMBINDEX, TROPONINI in the last 168 hours. BNP (last 3 results) No results for input(s): PROBNP in the last 8760 hours. HbA1C: No results for input(s): HGBA1C in the last 72 hours. CBG: No results for input(s): GLUCAP in the last 168 hours. Lipid Profile: No results for input(s): CHOL, HDL, LDLCALC, TRIG, CHOLHDL, LDLDIRECT in the last 72 hours. Thyroid Function Tests: No results for input(s): TSH, T4TOTAL, FREET4, T3FREE, THYROIDAB in the last 72 hours. Anemia Panel: No results for input(s): VITAMINB12, FOLATE, FERRITIN, TIBC, IRON, RETICCTPCT in the last 72 hours. Urine analysis:    Component Value Date/Time   COLORURINE STRAW (A) 08/29/2016 1300   APPEARANCEUR CLEAR 08/29/2016 1300   LABSPEC 1.003 (L) 08/29/2016 1300   PHURINE 8.0 08/29/2016 1300   GLUCOSEU >=500 (A) 08/29/2016 1300   HGBUR SMALL (A) 08/29/2016 1300   BILIRUBINUR NEGATIVE 08/29/2016 1300   KETONESUR NEGATIVE 08/29/2016 1300   PROTEINUR NEGATIVE 08/29/2016 1300   UROBILINOGEN 1.0 07/06/2012 1130   NITRITE NEGATIVE 08/29/2016 1300   LEUKOCYTESUR TRACE (A) 08/29/2016 1300    Radiological Exams on Admission: Dg Chest 2 View  Result Date: 06/04/2017 CLINICAL DATA:  84 year old female with a history of shortness of breath EXAM: CHEST - 2 VIEW COMPARISON:  08/29/2016 FINDINGS: Cardiomediastinal silhouette unchanged in size and contour. Increasing interlobular septal thickening and patchy airspace opacity/interstitial opacity. Low lung volumes persist. Unchanged cardiac pacing device with 2 leads in place. No pneumothorax. Lateral view demonstrates blunting of the costophrenic sulcus. IMPRESSION: Chest x-ray demonstrates developing CHF with small pleural effusion. Electronically Signed   By: Corrie Mckusick  D.O.   On: 06/04/2017 12:40    EKG: Independently reviewed.  Atrial flutter with variable 3-1 and 2-1 block, no acute ST segment changes,  Assessment/Plan Active Problems:   PAF (paroxysmal atrial fibrillation) (HCC)   Atrial fibrillation with RVR (HCC)   Diabetes mellitus, type II (HCC)   Dyslipidemia   Constipation   Chronic diastolic CHF (congestive  heart failure) ([place])   Pacemaker   #) Acute hypoxic respiratory failure: Unclear if this is related to diastolic heart failure exacerbation secondary to atrial flutter with rapid ventricular response.  This then begs the question with side of the atrial flutter.  On review of her chart the last time her pacer was interrogated her atrial fibrillation load was relatively low when she really had paroxysmal atrial fibrillation.  At this time she does not appear to have any evidence of pneumonia other than the elevated white blood cell count. - Diltiazem drip -Telemetry -Procalcitonin ordered - IV furosemide 40 mg twice daily -Repeat echo  #) Sinus infection: -Continue Augmentin, follow-up procalcitonin  #) Paroxysmal atrial fibrillation: -Continue home diltiazem 240 mg daily -Continue carvedilol 6.25 mg twice daily -Continue apixaban 2.5 mg twice daily  #) Hypertension/hyperlipidemia: -Continue rosuvastatin 2.5 mg every Monday Wednesday Friday - Continue beta-blocker  #) Type 2 diabetes: -Hold home linagliptin 5 mg daily -Sliding scale insulin, AC at bedtime  #) Pain/psych: - Continue sertraline 50 mg daily -Continue lorazepam 0.25 mg every morning and afternoon and 0.5 mg nightly  Fluids: Restrict Elect lites: Monitor and supplement Nutrition: Carb restricted, heart healthy diet, 2 L fluid restriction, 2 g sodium restriction  Prophylaxis: Apixaban  Disposition: Pending resolution of hypoxia and control heart rate and PT evaluation   DO NOT RESUSCITATE    Cristy Folks MD [facility]   If 7PM-7AM, please  contact night-coverage [email or web address] Password [facility]  06/04/2017, 2:11 PM

## 2017-06-04 NOTE — ED Notes (Signed)
EDP advises to hold Cardizem drip r/t control HR at this time, will continue to monitor.

## 2017-06-04 NOTE — ED Triage Notes (Signed)
Pt arrived from home w/ c/o SOB. Pt states that SOB woke her up about 230 am. Pt states she has been feeling bad since Wednesday. Denies pain. A&O from home alone. Uses walker for ambulation. Placed on 4L [place] via PTAR.

## 2017-06-05 ENCOUNTER — Inpatient Hospital Stay (HOSPITAL_COMMUNITY): Payer: Medicare Other

## 2017-06-05 DIAGNOSIS — I5033 Acute on chronic diastolic (congestive) heart failure: Secondary | ICD-10-CM

## 2017-06-05 DIAGNOSIS — E785 Hyperlipidemia, unspecified: Secondary | ICD-10-CM

## 2017-06-05 DIAGNOSIS — I361 Nonrheumatic tricuspid (valve) insufficiency: Secondary | ICD-10-CM

## 2017-06-05 DIAGNOSIS — R0902 Hypoxemia: Secondary | ICD-10-CM

## 2017-06-05 DIAGNOSIS — I351 Nonrheumatic aortic (valve) insufficiency: Secondary | ICD-10-CM

## 2017-06-05 DIAGNOSIS — E119 Type 2 diabetes mellitus without complications: Secondary | ICD-10-CM

## 2017-06-05 LAB — CBC WITH DIFFERENTIAL/PLATELET
Basophils Absolute: 0 10*3/uL (ref 0.0–0.1)
Basophils Relative: 0 %
Eosinophils Absolute: 0.1 K/uL (ref 0.0–0.7)
Eosinophils Relative: 1 %
HCT: 32.3 % — ABNORMAL LOW (ref 36.0–46.0)
Hemoglobin: 10.1 g/dL — ABNORMAL LOW (ref 12.0–15.0)
Lymphocytes Relative: 14 %
Lymphs Abs: 1.9 10*3/uL (ref 0.7–4.0)
MCH: 29.6 pg (ref 26.0–34.0)
MCHC: 31.3 g/dL (ref 30.0–36.0)
MCV: 94.7 fL (ref 78.0–100.0)
Monocytes Absolute: 1.3 10*3/uL — ABNORMAL HIGH (ref 0.1–1.0)
Monocytes Relative: 10 %
Neutro Abs: 10.1 10*3/uL — ABNORMAL HIGH (ref 1.7–7.7)
Neutrophils Relative %: 75 %
Platelets: 242 K/uL (ref 150–400)
RBC: 3.41 MIL/uL — ABNORMAL LOW (ref 3.87–5.11)
RDW: 13.4 % (ref 11.5–15.5)
WBC: 13.4 10*3/uL — ABNORMAL HIGH (ref 4.0–10.5)

## 2017-06-05 LAB — BASIC METABOLIC PANEL WITH GFR
BUN: 14 mg/dL (ref 6–20)
CO2: 30 mmol/L (ref 22–32)
Calcium: 8.8 mg/dL — ABNORMAL LOW (ref 8.9–10.3)
Chloride: 103 mmol/L (ref 101–111)
Creatinine, Ser: 0.81 mg/dL (ref 0.44–1.00)
GFR calc Af Amer: >60 mL/min (ref >60)

## 2017-06-05 LAB — GLUCOSE, CAPILLARY
Glucose-Capillary: 169 mg/dL — ABNORMAL HIGH (ref 65–99)
Glucose-Capillary: 275 mg/dL — ABNORMAL HIGH (ref 65–99)
Glucose-Capillary: 72 mg/dL (ref 65–99)
Glucose-Capillary: 169 mg/dL — ABNORMAL HIGH (ref 65–99)

## 2017-06-05 LAB — ECHOCARDIOGRAM COMPLETE
Height: 62 in
Weight: 1726.4 oz

## 2017-06-05 LAB — MAGNESIUM: Magnesium: 2 mg/dL (ref 1.7–2.4)

## 2017-06-05 LAB — BASIC METABOLIC PANEL
Anion gap: 7 (ref 5–15)
GFR calc non Af Amer: 59 mL/min — ABNORMAL LOW (ref >60)
Glucose, Bld: 165 mg/dL — ABNORMAL HIGH (ref 65–99)
Potassium: 3.5 mmol/L (ref 3.5–5.1)
Sodium: 140 mmol/L (ref 135–145)

## 2017-06-05 MED ORDER — FUROSEMIDE 10 MG/ML IJ SOLN
40.0000 mg | Freq: Every day | INTRAMUSCULAR | Status: DC
  Administered 2017-06-06: 40 mg via INTRAVENOUS
  Filled 2017-06-05: qty 4

## 2017-06-05 MED ORDER — POTASSIUM CHLORIDE CRYS ER 20 MEQ PO TBCR
40.0000 meq | EXTENDED_RELEASE_TABLET | Freq: Once | ORAL | Status: AC
  Administered 2017-06-05: 40 meq via ORAL
  Filled 2017-06-05: qty 2

## 2017-06-05 MED ORDER — ENSURE ENLIVE PO LIQD: 237.0000 mL | Freq: Two times a day (BID) | ORAL | Status: DC

## 2017-06-05 MED ORDER — LORAZEPAM 0.5 MG PO TABS
0.2500 mg | ORAL_TABLET | Freq: Every morning | ORAL | Status: DC
  Administered 2017-06-05 – 2017-06-07 (×3): 0.25 mg via ORAL
  Filled 2017-06-05 (×3): qty 1

## 2017-06-05 MED ORDER — WHITE PETROLATUM EX OINT
TOPICAL_OINTMENT | CUTANEOUS | Status: AC
  Filled 2017-06-05: qty 28.35

## 2017-06-05 NOTE — Progress Notes (Signed)
PROGRESS NOTE        PATIENT DETAILS Name: Jo [name] Age: 84 y.o. Sex: female Date of Birth: Cairns 22, 1919 Admit Date: 06/04/2017 Admitting Physician Cristy Folks, MD [id number], Elta Gu[place], MD  Brief Narrative: Patient is a 84 y.o. female with prior history of sick sinus syndrome status post pacemaker placement, paroxysmal atrial fibrillation, chronic diastolic heart failure who presented with shortness of breath, found to have acute on chronic diastolic heart failure and atrial fibrillation with RVR.  Subjective: Remains in atrial fibrillation-however heart rate better controlled.  No longer on Cardizem infusion.  Claims that her shortness of breath has markedly improved.  Assessment/Plan: Atrial fibrillation with RVR: Rate better controlled-no longer on Cardizem infusion-continue oral Coreg and Cardizem.  Continue Eliquis.  Continue telemetry monitoring-monitor another 24 hours to see if she spontaneously converted back to sinus rhythm.  If she still is in atrial fibrillation we will touch base with her primary EP MD.  Acute on chronic diastolic heart failure: Likely provoked by above-improved-able to lie flat-no leg edema-continue Lasix for Lasix for 1 additional day-as still has bibasilar rales.  Follow weights/intake output and electrolytes closely.  Acute hypoxic respiratory failure: Secondary to above-titrate off oxygen as heart failure seems to have significantly improved.  DM-2: CBG stable with SSI-resume oral hypoglycemic agents on discharge.  Dyslipidemia: Continue statin  Anxiety/depression: Appears to be stable-continue Zoloft and lorazepam.  DVT Prophylaxis: Full dose anticoagulation with Eliquis  Code Status: DNR  Family Communication: None at bedside  Disposition Plan: Remain inpatient-hopefully home in the next day or so.  Antimicrobial agents: Anti-infectives (From admission, onward)   Start     Dose/Rate Route Frequency Ordered Stop    06/04/17 2200  amoxicillin-clavulanate (AUGMENTIN) 875-125 MG per tablet 1 tablet    Note to Pharmacy:  Take 1 tablet BID for 7 days     1 tablet Oral 2 times daily 06/04/17 1956 06/11/17 0959      Procedures: None  CONSULTS:  None  Time spent: 25- minutes-Greater than 50% of this time was spent in counseling, explanation of diagnosis, planning of further management, and coordination of care.  MEDICATIONS: Scheduled Meds: . amoxicillin-clavulanate  1 tablet Oral BID  . apixaban  2.5 mg Oral BID  . carvedilol  6.25 mg Oral BID  . diltiazem  240 mg Oral Daily  . feeding supplement (ENSURE ENLIVE)  237 mL Oral BID BM  . furosemide  40 mg Intravenous BID  . insulin aspart  0-9 Units Subcutaneous TID WC  . LORazepam  0.25 mg Intravenous BID  . LORazepam  0.5 mg Oral QHS  . rosuvastatin  2.5 mg Oral Q M,W,F  . sertraline  50 mg Oral Daily  . sodium chloride flush  3 mL Intravenous Q12H  . [START ON 06/07/2017] Vitamin D (Ergocalciferol)  50,000 Units Oral Q Wed   Continuous Infusions: . sodium chloride     PRN Meds:.sodium chloride, acetaminophen, azelastine, ondansetron (ZOFRAN) IV, polyethylene glycol, sodium chloride flush   PHYSICAL EXAM: Vital signs: Vitals:   06/05/17 0100 06/05/17 0200 06/05/17 0300 06/05/17 0424  BP:    (!) 101/53  Pulse: 75 77 (!) 55 73  Resp: 20 (!) 25 (!) 22 (!) 22  Temp:    98.2 F (36.8 C)  TempSrc:    Oral  SpO2: 95% 94% 94% 94%  Weight:  48.9 kg (107 lb 14.4 oz)  Height:    5\' 2"  (1.575 m)   Filed Weights   06/04/17 1157 06/04/17 1955 06/05/17 0424  Weight: 60.3 kg (133 lb) 48.9 kg (107 lb 14.4 oz) 48.9 kg (107 lb 14.4 oz)   Body mass index is 19.74 kg/m.   General appearance :Awake, alert, not in any distress. Speech Clear. HEENT: Atraumatic and Normocephalic Neck: supple, no JVD. No cervical lymphadenopathy. No thyromegaly Resp:Good air entry bilaterally, bibasilar rales CVS: S1 S2 regular, no murmurs.  GI: Bowel sounds  present, Non tender and not distended with no gaurding, rigidity or rebound.No organomegaly Extremities: B/L Lower Ext shows no edema, both legs are warm to touch Neurology:  speech clear,Non focal, sensation is grossly intact. Psychiatric: Normal judgment and insight. Alert and oriented x 3. Normal mood. Musculoskeletal:No digital cyanosis Skin:No Rash, warm and dry Wounds:N/A  I have personally reviewed following labs and imaging studies  LABORATORY DATA: CBC: Recent Labs  Lab 06/04/17 1224 06/05/17 0421  WBC 15.1* 13.4*  NEUTROABS 12.2* 10.1*  HGB 10.8* 10.1*  HCT 32.7* 32.3*  MCV 94.2 94.7  PLT 217 093    Basic Metabolic Panel: Recent Labs  Lab 06/04/17 1224 06/05/17 0421  NA 137 140  K 4.1 3.5  CL 104 103  CO2 26 30  GLUCOSE 207* 165*  BUN 17 14  CREATININE 0.75 0.81  CALCIUM 9.0 8.8*  MG  --  2.0    GFR: Estimated Creatinine Clearance: 30.6 mL/min (by C-G formula based on SCr of 0.81 mg/dL).  Liver Function Tests: No results for input(s): AST, ALT, ALKPHOS, BILITOT, PROT, ALBUMIN in the last 168 hours. No results for input(s): LIPASE, AMYLASE in the last 168 hours. No results for input(s): AMMONIA in the last 168 hours.  Coagulation Profile: No results for input(s): INR, PROTIME in the last 168 hours.  Cardiac Enzymes: No results for input(s): CKTOTAL, CKMB, CKMBINDEX, TROPONINI in the last 168 hours.  BNP (last 3 results) No results for input(s): PROBNP in the last 8760 hours.  HbA1C: No results for input(s): HGBA1C in the last 72 hours.  CBG: Recent Labs  Lab 06/04/17 2326 06/05/17 1012 06/05/17 1143  GLUCAP 175* 169* 275*    Lipid Profile: No results for input(s): CHOL, HDL, LDLCALC, TRIG, CHOLHDL, LDLDIRECT in the last 72 hours.  Thyroid Function Tests: No results for input(s): TSH, T4TOTAL, FREET4, T3FREE, THYROIDAB in the last 72 hours.  Anemia Panel: No results for input(s): VITAMINB12, FOLATE, FERRITIN, TIBC, IRON, RETICCTPCT  in the last 72 hours.  Urine analysis:    Component Value Date/Time   COLORURINE STRAW (A) 08/29/2016 1300   APPEARANCEUR CLEAR 08/29/2016 1300   LABSPEC 1.003 (L) 08/29/2016 1300   PHURINE 8.0 08/29/2016 1300   GLUCOSEU >=500 (A) 08/29/2016 1300   HGBUR SMALL (A) 08/29/2016 1300   BILIRUBINUR NEGATIVE 08/29/2016 1300   KETONESUR NEGATIVE 08/29/2016 1300   PROTEINUR NEGATIVE 08/29/2016 1300   UROBILINOGEN 1.0 07/06/2012 1130   NITRITE NEGATIVE 08/29/2016 1300   LEUKOCYTESUR TRACE (A) 08/29/2016 1300    Sepsis Labs: Lactic Acid, Venous    Component Value Date/Time   LATICACIDVEN 0.89 06/27/2015 2049    MICROBIOLOGY: No results found for this or any previous visit (from the past 240 hour(s)).  RADIOLOGY STUDIES/RESULTS: Dg Chest 2 View  Result Date: 06/04/2017 CLINICAL DATA:  84 year old female with a history of shortness of breath EXAM: CHEST - 2 VIEW COMPARISON:  08/29/2016 FINDINGS: Cardiomediastinal silhouette unchanged in size and  contour. Increasing interlobular septal thickening and patchy airspace opacity/interstitial opacity. Low lung volumes persist. Unchanged cardiac pacing device with 2 leads in place. No pneumothorax. Lateral view demonstrates blunting of the costophrenic sulcus. IMPRESSION: Chest x-ray demonstrates developing CHF with small pleural effusion. Electronically Signed   By: Corrie Mckusick D.O.   On: 06/04/2017 12:40     LOS: 1 day   Oren Binet, MD  [facility] Pager:[phone]  If 7PM-7AM, please contact night-coverage [email or web address] Password [email or web address] 06/05/2017, 1:26 PM

## 2017-06-05 NOTE — Progress Notes (Signed)
SATURATION QUALIFICATIONS: (This note is used to comply with regulatory documentation for home oxygen)  Patient Saturations on Room Air at Rest = 94%  Patient Saturations on Room Air while Ambulating = 87%  Patient Saturations on 2 Liters of oxygen while Ambulating = 95%  Please briefly explain why patient needs home oxygen:Pt desats with activity on RA.  Will need home O2. Thanks.  [place] [phone] (pager)

## 2017-06-05 NOTE — Evaluation (Signed)
Physical Therapy Evaluation Patient Details Name: Jo [name] MRN: [id number] DOB: 14-Aug-1917 Today's Date: 06/05/2017   History of Present Illness  Jo [name] is a 84 y.o. female with medical history significant of paroxysmal atrial fibrillation and sick sinus syndrome status post pacer Medtronic.  Chronic diastolic heart failure grade 1 diastolic dysfunction by echo on 08/30/2016, hypertension, type 2 diabetes on oral hypoglycemic medications, hyperlipidemia who comes in with shortness of breath.    Clinical Impression  Pt admitted with above diagnosis. Pt currently with functional limitations due to the deficits listed below (see PT Problem List). Pt was able to pivot to chair with min assist.  Limited as BP dropped and pt orthostatic. Did improve once pt was in chair. Also, pt desat on RA therefore replaced 2LO2 on pt.  Will follow acutely.   Pt will benefit from skilled PT to increase their independence and safety with mobility to allow discharge to the venue listed below.   Orthostatic BPs  Supine 92/48, 91 bpm  Sitting 73/45, 110 bpm  Standing 78/26, 134 bpm  Standing after 3 min NT as pt was dizzy   BP once in chair 96/56 with HR 128 bpm.  Pt sats on RA at rest 94%.  With activity desat to 87% therefore replaced O2 at 2L.   Follow Up Recommendations SNF;Supervision/Assistance - 24 hour    Equipment Recommendations  None recommended by PT    Recommendations for Other Services       Precautions / Restrictions Precautions Precautions: Fall Restrictions Weight Bearing Restrictions: No      Mobility  Bed Mobility Overal bed mobility: Independent                Transfers Overall transfer level: Needs assistance Equipment used: Rolling walker (2 wheeled) Transfers: Sit to/from [facility] Sit to Stand: Min assist Stand pivot transfers: Min assist       General transfer comment: min assist to power up and to stand pivot.   Ambulation/Gait              General Gait Details: Did not ambulate as pt was orthostatic with report of dizziness.  Did stabliize once in chair.   Stairs            Wheelchair Mobility    Modified Rankin (Stroke Patients Only)       Balance Overall balance assessment: Needs assistance Sitting-balance support: No upper extremity supported;Feet supported Sitting balance-Leahy Scale: Fair     Standing balance support: During functional activity;Bilateral upper extremity supported Standing balance-Leahy Scale: Poor Standing balance comment: relies on UE support for balance.                             Pertinent Vitals/Pain Pain Assessment: No/denies pain    Home Living Family/patient expects to be discharged to:: Skilled nursing facility Living Arrangements: Alone Available Help at Discharge: Family;Available PRN/intermittently Type of Home: House Home Access: Stairs to enter Entrance Stairs-Rails: None Entrance Stairs-Number of Steps: 3 Home Layout: One level Home Equipment: Walker - 4 wheels;Shower seat Additional Comments: Has life alert    Prior Function Level of Independence: Needs assistance   Gait / Transfers Assistance Needed: Mod I with rollator, states that she has assistance on steps.     Comments: Meals on Wheels     Hand Dominance        Extremity/Trunk Assessment   Upper Extremity Assessment Upper Extremity Assessment: Defer  to OT evaluation    Lower Extremity Assessment Lower Extremity Assessment: Generalized weakness    Cervical / Trunk Assessment Cervical / Trunk Assessment: Normal  Communication   Communication: HOH  Cognition Arousal/Alertness: Awake/alert Behavior During Therapy: Flat affect Overall Cognitive Status: Within Functional Limits for tasks assessed                                        General Comments      Exercises     Assessment/Plan    PT Assessment Patient needs continued PT services  PT  Problem List Decreased activity tolerance;Decreased balance;Decreased mobility;Decreased knowledge of use of DME;Decreased safety awareness;Decreased knowledge of precautions       PT Treatment Interventions DME instruction;Gait training;Therapeutic exercise;Therapeutic activities;Functional mobility training;Patient/family education;Balance training    PT Goals (Current goals can be found in the Care Plan section)  Acute Rehab PT Goals Patient Stated Goal: to get better PT Goal Formulation: With patient Time For Goal Achievement: 06/19/17 Potential to Achieve Goals: Good    Frequency Min 2X/week   Barriers to discharge Decreased caregiver support      Co-evaluation               AM-PAC PT "6 Clicks" Daily Activity  Outcome Measure Difficulty turning over in bed (including adjusting bedclothes, sheets and blankets)?: None Difficulty moving from lying on back to sitting on the side of the bed? : None Difficulty sitting down on and standing up from a chair with arms (e.g., wheelchair, bedside commode, etc,.)?: A Little Help needed moving to and from a bed to chair (including a wheelchair)?: A Little Help needed walking in hospital room?: Total Help needed climbing 3-5 steps with a railing? : Total 6 Click Score: 16    End of Session Equipment Utilized During Treatment: Gait belt Activity Tolerance: Patient limited by fatigue(limited by BP) Patient left: in chair;with call bell/phone within reach;with chair alarm set Nurse Communication: Mobility status PT Visit Diagnosis: Unsteadiness on feet (R26.81);Muscle weakness (generalized) (M62.81)    Time: 1308-6578 PT Time Calculation (min) (ACUTE ONLY): 18 min   Charges:   PT Evaluation $PT Eval Moderate Complexity: 1 Mod     PT G Codes:        [place] Maiorino,PT Acute Rehabilitation [phone] (pager)   Denice Paradise 06/05/2017, 1:34 PM

## 2017-06-05 NOTE — Progress Notes (Signed)
  Echocardiogram 2D Echocardiogram has been performed.  Jennette Dubin 06/05/2017, 8:57 AM

## 2017-06-05 NOTE — Progress Notes (Addendum)
MD --Medtronic pacer appears to be on demand only.   Please indicate in a Note the proper setting,

## 2017-06-05 NOTE — Progress Notes (Signed)
MD----Pt daughter explained pt take Lorazepam 0.25mg  HS, 0.125mg  at 10:00, and then again at 15:00.   MAR does not reflect this.  Pt states she gets too groggy if she takes  0.25mg  during the day.  Thank you

## 2017-06-05 NOTE — Progress Notes (Signed)
Initial Nutrition Assessment  DOCUMENTATION CODES:   Not applicable  INTERVENTION:    Ensure Enlive po BID, each supplement provides 350 kcal and 20 grams of protein  No BM in 10 days, recommend bowel regimen.  NUTRITION DIAGNOSIS:   Inadequate oral intake related to decreased appetite as evidenced by meal completion < 25%.  GOAL:   Patient will meet greater than or equal to 90% of their needs  MONITOR:   PO intake, Supplement acceptance, Labs, I & O's  REASON FOR ASSESSMENT:   Malnutrition Screening Tool    ASSESSMENT:   84 yo female with PMH of anxiety, HTN, PAF, DM-2, arthritis, depression, SSS, pacemaker, osteoporosis, and CHF who was admitted on 5/12 with SOB, acute on chronic HF and A fib with RVR.   Patient is very [place]. She reports fair appetite and intake, likes Ensure. Lunch tray at bedside; she consumed ~25% of the meal.    Labs and medications reviewed.  Mild-moderate muscle depletion likely related to advanced age.   Recent weight loss noted; weight has ranged from 122-135 lbs over the past 6 months. 12% weight loss within 6 months is significant for the time frame, however, suspect some of this weight loss was related to fluids.   NUTRITION - FOCUSED PHYSICAL EXAM:    Most Recent Value  Orbital Region  No depletion  Upper Arm Region  No depletion  Thoracic and Lumbar Region  No depletion  Buccal Region  No depletion  Temple Region  Mild depletion  Clavicle Bone Region  Mild depletion  Clavicle and Acromion Bone Region  Mild depletion  Scapular Bone Region  No depletion  Dorsal Hand  Moderate depletion  Patellar Region  Mild depletion  Anterior Thigh Region  Unable to assess  Posterior Calf Region  Mild depletion  Edema (RD Assessment)  None  Hair  Reviewed  Eyes  Reviewed  Mouth  Reviewed  Skin  Reviewed  Nails  Reviewed       Diet Order:   Diet Order           Diet heart healthy/carb modified Room service appropriate? Yes; Fluid  consistency: Thin; Fluid restriction: 1500 mL Fluid  Diet effective now          EDUCATION NEEDS:   No education needs have been identified at this time  Skin:  Skin Assessment: Reviewed RN Assessment  Last BM:  5/3  Height:   Ht Readings from Last 1 Encounters:  06/05/17 5\' 2"  (1.575 m)    Weight:   Wt Readings from Last 1 Encounters:  06/05/17 107 lb 14.4 oz (48.9 kg)    Ideal Body Weight:  50 kg  BMI:  Body mass index is 19.74 kg/m.  Estimated Nutritional Needs:   Kcal:  1200-1350  Protein:  65-75 gm  Fluid:  1.4 L    Molli Barrows, RD, LDN, [place] Pager [phone] After Hours Pager [phone]

## 2017-06-05 NOTE — Progress Notes (Signed)
Pt worked with PT today. At some point (?) Purewick was removed and not replaced. Pt had large incontinent episode after, therefor output is inaccurate.

## 2017-06-06 DIAGNOSIS — I4891 Unspecified atrial fibrillation: Secondary | ICD-10-CM

## 2017-06-06 DIAGNOSIS — I5032 Chronic diastolic (congestive) heart failure: Secondary | ICD-10-CM

## 2017-06-06 DIAGNOSIS — Z950 Presence of cardiac pacemaker: Secondary | ICD-10-CM

## 2017-06-06 DIAGNOSIS — I5031 Acute diastolic (congestive) heart failure: Secondary | ICD-10-CM

## 2017-06-06 LAB — GLUCOSE, CAPILLARY
Glucose-Capillary: 189 mg/dL — ABNORMAL HIGH (ref 65–99)
Glucose-Capillary: 193 mg/dL — ABNORMAL HIGH (ref 65–99)
Glucose-Capillary: 179 mg/dL — ABNORMAL HIGH (ref 65–99)
Glucose-Capillary: 138 mg/dL — ABNORMAL HIGH (ref 65–99)

## 2017-06-06 LAB — BASIC METABOLIC PANEL
Anion gap: 10 (ref 5–15)
BUN: 19 mg/dL (ref 6–20)
CALCIUM: 8.9 mg/dL (ref 8.9–10.3)
CO2: 28 mmol/L (ref 22–32)
CREATININE: 0.89 mg/dL (ref 0.44–1.00)
Chloride: 103 mmol/L (ref 101–111)
GFR calc Af Amer: >60 mL/min (ref >60)
GFR, EST NON AFRICAN AMERICAN: 53 mL/min — AB (ref >60)
GLUCOSE: 175 mg/dL — AB (ref 65–99)
POTASSIUM: 4.2 mmol/L (ref 3.5–5.1)
SODIUM: 141 mmol/L (ref 135–145)

## 2017-06-06 MED ORDER — AMOXICILLIN-POT CLAVULANATE 500-125 MG PO TABS
1.0000 | ORAL_TABLET | Freq: Two times a day (BID) | ORAL | Status: DC
  Administered 2017-06-06 – 2017-06-07 (×2): 500 mg via ORAL
  Filled 2017-06-06 (×2): qty 1

## 2017-06-06 MED ORDER — FUROSEMIDE 40 MG PO TABS
40.0000 mg | ORAL_TABLET | Freq: Every day | ORAL | Status: DC
  Administered 2017-06-07: 40 mg via ORAL
  Filled 2017-06-06: qty 1

## 2017-06-06 MED ORDER — AMIODARONE HCL 200 MG PO TABS
200.0000 mg | ORAL_TABLET | Freq: Every day | ORAL | Status: DC
  Administered 2017-06-06 – 2017-06-07 (×2): 200 mg via ORAL
  Filled 2017-06-06 (×2): qty 1

## 2017-06-06 NOTE — Progress Notes (Signed)
PHARMACY NOTE:  ANTIMICROBIAL RENAL DOSAGE ADJUSTMENT  Current antimicrobial regimen includes a mismatch between antimicrobial dosage and estimated renal function.  As per policy approved by the Pharmacy & Therapeutics and Medical Executive Committees, the antimicrobial dosage will be adjusted accordingly.  Current antimicrobial dosage:  Augmentin 875 mg bid  Indication: Sinus Infection  Renal Function:  Estimated Creatinine Clearance: 27.7 mL/min (by C-G formula based on SCr of 0.89 mg/dL). []      On intermittent HD, scheduled: []      On CRRT    Antimicrobial dosage has been changed to:  Augmentin 500 mg bid    Thank you for allowing pharmacy to be a part of this patient's care.  Corinda Gubler, [facility] 06/06/2017 10:57 AM

## 2017-06-06 NOTE — Progress Notes (Signed)
PROGRESS NOTE        PATIENT DETAILS Name: Jo [name] Age: 84 y.o. Sex: female Date of Birth: May 18, 1917 Admit Date: 06/04/2017 Admitting Physician Cristy Folks, MD ZDG:LOVFIE, Elta Guadeloupe, MD  Brief Narrative: Patient is a 84 y.o. female with prior history of sick sinus syndrome status post pacemaker placement, paroxysmal atrial fibrillation, chronic diastolic heart failure who presented with shortness of breath, found to have acute on chronic diastolic heart failure and atrial fibrillation with RVR.  Subjective: Comfortably in bed-remains in atrial fibrillation-rates were in the 120s earlier this morning.  Has been a stable and much improved.    Assessment/Plan: Atrial fibrillation with RVR: Initially required Cardizem infusion-rate better controlled-continue Coreg and Cardizem.  Anticoagulated with Eliquis.  She continues to be in persistent atrial fibrillation-she also was orthostatic with physical therapy yesterday-she previously was on flecainide-have consulted EP  Acute on chronic diastolic heart failure: Likely provoked by above-improved-we will stop IV Lasix and transition to oral Lasix.  Acute hypoxic respiratory failure: Secondary to compensated diastolic heart failure-titrate off oxygen as tolerated.  .  DM-2: CBG stable with SSI-resume oral hypoglycemic agents on discharge.  Dyslipidemia: Continue statin  Anxiety/depression: Appears to be stable-continue Zoloft and lorazepam.  DVT Prophylaxis: Full dose anticoagulation with Eliquis  Code Status: DNR  Family Communication: None at bedside  Disposition Plan: Remain inpatient-  Antimicrobial agents: Anti-infectives (From admission, onward)   Start     Dose/Rate Route Frequency Ordered Stop   06/06/17 2200  amoxicillin-clavulanate (AUGMENTIN) 500-125 MG per tablet 500 mg     1 tablet Oral 2 times daily 06/06/17 1056 06/11/17 0959   06/04/17 2200  amoxicillin-clavulanate (AUGMENTIN) 875-125  MG per tablet 1 tablet  Status:  Discontinued    Note to Pharmacy:  Take 1 tablet BID for 7 days     1 tablet Oral 2 times daily 06/04/17 1956 06/06/17 1056      Procedures: None  CONSULTS:  None  Time spent: 25- minutes-Greater than 50% of this time was spent in counseling, explanation of diagnosis, planning of further management, and coordination of care.  MEDICATIONS: Scheduled Meds: . amoxicillin-clavulanate  1 tablet Oral BID  . apixaban  2.5 mg Oral BID  . carvedilol  6.25 mg Oral BID  . diltiazem  240 mg Oral Daily  . feeding supplement (ENSURE ENLIVE)  237 mL Oral BID BM  . furosemide  40 mg Intravenous Daily  . insulin aspart  0-9 Units Subcutaneous TID WC  . LORazepam  0.25 mg Oral q morning - 10a  . LORazepam  0.5 mg Oral QHS  . rosuvastatin  2.5 mg Oral Q M,W,F  . sertraline  50 mg Oral Daily  . sodium chloride flush  3 mL Intravenous Q12H  . [START ON 06/07/2017] Vitamin D (Ergocalciferol)  50,000 Units Oral Q Wed   Continuous Infusions: . sodium chloride     PRN Meds:.sodium chloride, acetaminophen, azelastine, ondansetron (ZOFRAN) IV, polyethylene glycol, sodium chloride flush   PHYSICAL EXAM: Vital signs: Vitals:   06/05/17 2145 06/05/17 2300 06/06/17 0606 06/06/17 0607  BP: 136/65  101/72   Pulse: 76 68 (!) 40 69  Resp:  16 (!) 23   Temp:   98 F (36.7 C)   TempSrc:   Oral   SpO2:  97% 94% 95%  Weight:    48.5 kg (107 lb)  Height:       Filed Weights   06/04/17 1955 06/05/17 0424 06/06/17 0607  Weight: 48.9 kg (107 lb 14.4 oz) 48.9 kg (107 lb 14.4 oz) 48.5 kg (107 lb)   Body mass index is 19.57 kg/m.   General appearance :Awake, alert, not in any distress. HEENT: Atraumatic and Normocephalic Neck: supple, no JVD. No cervical lymphadenopathy. No thyromegaly Resp:Good air entry bilaterally, few bibasilar rales CVS: S1 S2 regular, no murmurs.  GI: Bowel sounds present, Non tender and not distended with no gaurding, rigidity or  rebound. Extremities: B/L Lower Ext shows no edema, both legs are warm to touch Neurology:  speech clear,Non focal, sensation is grossly intact. Musculoskeletal:No digital cyanosis Skin:No Rash, warm and dry Wounds:N/A  I have personally reviewed following labs and imaging studies  LABORATORY DATA: CBC: Recent Labs  Lab 06/04/17 1224 06/05/17 0421  WBC 15.1* 13.4*  NEUTROABS 12.2* 10.1*  HGB 10.8* 10.1*  HCT 32.7* 32.3*  MCV 94.2 94.7  PLT 217 709    Basic Metabolic Panel: Recent Labs  Lab 06/04/17 1224 06/05/17 0421 06/06/17 0440  NA 137 140 141  K 4.1 3.5 4.2  CL 104 103 103  CO2 26 30 28   GLUCOSE 207* 165* 175*  BUN 17 14 19   CREATININE 0.75 0.81 0.89  CALCIUM 9.0 8.8* 8.9  MG  --  2.0  --     GFR: Estimated Creatinine Clearance: 27.7 mL/min (by C-G formula based on SCr of 0.89 mg/dL).  Liver Function Tests: No results for input(s): AST, ALT, ALKPHOS, BILITOT, PROT, ALBUMIN in the last 168 hours. No results for input(s): LIPASE, AMYLASE in the last 168 hours. No results for input(s): AMMONIA in the last 168 hours.  Coagulation Profile: No results for input(s): INR, PROTIME in the last 168 hours.  Cardiac Enzymes: No results for input(s): CKTOTAL, CKMB, CKMBINDEX, TROPONINI in the last 168 hours.  BNP (last 3 results) No results for input(s): PROBNP in the last 8760 hours.  HbA1C: No results for input(s): HGBA1C in the last 72 hours.  CBG: Recent Labs  Lab 06/05/17 1143 06/05/17 1700 06/05/17 2136 06/06/17 0755 06/06/17 1136  GLUCAP 275* 72 169* 189* 193*    Lipid Profile: No results for input(s): CHOL, HDL, LDLCALC, TRIG, CHOLHDL, LDLDIRECT in the last 72 hours.  Thyroid Function Tests: No results for input(s): TSH, T4TOTAL, FREET4, T3FREE, THYROIDAB in the last 72 hours.  Anemia Panel: No results for input(s): VITAMINB12, FOLATE, FERRITIN, TIBC, IRON, RETICCTPCT in the last 72 hours.  Urine analysis:    Component Value Date/Time    COLORURINE STRAW (A) 08/29/2016 1300   APPEARANCEUR CLEAR 08/29/2016 1300   LABSPEC 1.003 (L) 08/29/2016 1300   PHURINE 8.0 08/29/2016 1300   GLUCOSEU >=500 (A) 08/29/2016 1300   HGBUR SMALL (A) 08/29/2016 1300   BILIRUBINUR NEGATIVE 08/29/2016 1300   KETONESUR NEGATIVE 08/29/2016 1300   PROTEINUR NEGATIVE 08/29/2016 1300   UROBILINOGEN 1.0 07/06/2012 1130   NITRITE NEGATIVE 08/29/2016 1300   LEUKOCYTESUR TRACE (A) 08/29/2016 1300    Sepsis Labs: Lactic Acid, Venous    Component Value Date/Time   LATICACIDVEN 0.89 06/27/2015 2049    MICROBIOLOGY: No results found for this or any previous visit (from the past 240 hour(s)).  RADIOLOGY STUDIES/RESULTS: Dg Chest 2 View  Result Date: 06/04/2017 CLINICAL DATA:  84 year old female with a history of shortness of breath EXAM: CHEST - 2 VIEW COMPARISON:  08/29/2016 FINDINGS: Cardiomediastinal silhouette unchanged in size and contour. Increasing interlobular septal thickening  and patchy airspace opacity/interstitial opacity. Low lung volumes persist. Unchanged cardiac pacing device with 2 leads in place. No pneumothorax. Lateral view demonstrates blunting of the costophrenic sulcus. IMPRESSION: Chest x-ray demonstrates developing CHF with small pleural effusion. Electronically Signed   By: Corrie Mckusick D.O.   On: 06/04/2017 12:40     LOS: 2 days   Oren Binet, MD  [facility] Pager:[phone]  If 7PM-7AM, please contact night-coverage [email or web address] Password [email or web address] 06/06/2017, 1:46 PM

## 2017-06-06 NOTE — Clinical Social Work Note (Signed)
Clinical Social Work Assessment  Patient Details  Name: Jo [name] MRN: [id number] Date of Birth: 08-12-1917  Date of referral:  06/06/17               Reason for consult:  Facility Placement, Discharge Planning                Permission sought to share information with:  Facility Sport and exercise psychologist, Family Supports Permission granted to share information::  Yes, Verbal Permission Granted  Name::     Jo [name]  Agency::  SNFs  Relationship::  daughter  Contact Information:  [phone]  Housing/Transportation Living arrangements for the past 2 months:  Single Family Home Source of Information:  Patient, Adult Children Patient Interpreter Needed:  None Criminal Activity/Legal Involvement Pertinent to Current Situation/Hospitalization:  No - Comment as needed Significant Relationships:  Adult Children, Other Family Members Lives with:  Self Do you feel safe going back to the place where you live?  Yes Need for family participation in patient care:  No (Coment)  Care giving concerns: Patient from home independently. Family lives in the area. PT recommending SNF.  Social Worker assessment / plan: CSW met with patient at bedside. Patient alert and oriented. CSW introduced self and role and discussed PT recommendation for SNF. Patient reported she has been at [facility] in the past and has had home health PT as well. Patient agreeable to SNF and would like to go back to [facility]. Patient's daughter lives in [place] near [place].  CSW also spoke to patient's daughter, Jo [name], via phone. Jo [name] understanding of recommendation for SNF, stating she thought patient would need SNF again due to advanced age and health conditions.   CSW sent out initial referrals, awaiting offers. CSW to provide offers when available and support with discharge when medically stable.  Employment status:  Retired Forensic scientist:  Medicare PT Recommendations:  [place] / Referral to community resources:  [place]  Patient/Family's Response to care: Patient and daughter appreciative of care.  Patient/Family's Understanding of and Emotional Response to Diagnosis, Current Treatment, and Prognosis: Patient and daughter with good understanding of patient's condition and agreeable to SNF.  Emotional Assessment Appearance:  Appears stated age Attitude/Demeanor/Rapport:  Engaged Affect (typically observed):  Accepting, Appropriate, Calm, Pleasant Orientation:  Oriented to Self, Oriented to Place, Oriented to  Time, Oriented to Situation Alcohol / Substance use:  Not Applicable Psych involvement (Current and /or in the community):  No (Comment)  Discharge Needs  Concerns to be addressed:  Discharge Planning Concerns, Care Coordination Readmission within the last 30 days:  No Current discharge risk:  Physical Impairment, Lives alone Barriers to Discharge:  Continued Medical Work up   Estanislado Emms, LCSW 06/06/2017, 11:07 AM

## 2017-06-06 NOTE — Consult Note (Addendum)
ELECTROPHYSIOLOGY CONSULT NOTE    Patient ID: Jo [name] MRN: [id number], DOB/AGE: 07/07/1917 84 y.o.  Admit date: 06/04/2017 Date of Consult: 06/06/2017  Primary Physician: Crist Infante, MD Primary Cardiologist: [place] Electrophysiologist: Henretta Quist  Patient Profile: Jo [name] is a 84 y.o. female with a history of persistent atrial fibrillation, SSS s/p PPM, chronic diastolic heart failure who is being seen today for the evaluation of AF at the request of Dr Sloan Leiter.  HPI:  Jo [name] is a 84 y.o. female with the above past medical history. She was admitted with worsening shortness of breath in the setting of AF with RVR. She has previously failed Flecainide. She has been rate controlled but started having problems with orthostasis. EP has been asked to evaluate for treatment options. She is seen alone today and just states that she is tired. She feels much better since converting to SR earlier today.  She was on Coreg 6.25mg  twice daily as well as Diltiazem 240mg  daily at home. She was initially on Diltiazem drip here but has converted back to po dosing.   Echo this admission demonstrated EF 65-70%, no RWMA, moderate AR, mild MR, LA 32.   She denies chest pain, nausea, vomiting, syncope, edema, weight gain, or early satiety.  Past Medical History:  Diagnosis Date  . Anxiety   . Arthritis    "hands; between shoulders; back; feet"  . Chronic anticoagulation    -->Eliquis  . Chronic diastolic CHF (congestive heart failure) ([place])    a. 06/2011 Echo: EF 60-65%.  . Depression   . Essential hypertension   . High risk medication use    on Flecainide  . Kidney stone    passed in [place] 2014  . Moderate aortic insufficiency    a. 06/2011 Echo: EF 60-65%, mild LVH, no rwma, mod AI.  Marland Kitchen Osteoporosis   . Osteoporosis   . PAF (paroxysmal atrial fibrillation) (H[facility])    a. chronic flecainide->reduced to 25 bid 08/2014.  Mar[place] Kitchen Panic attacks    "since losing husband 1994"  . Skin cancer ?1960's   "between breasts"  . SSS (sick sinus syndrome) (H[facility])    a. s/p pacemaker. b. s/p Medtronic gen change 07/2011; c.   . Type II diabetes mellitus ([place])    "controlled w/diet"     Surgical History:  Past Surgical History:  Procedure Laterality Date  . APPENDECTOMY  1955  . BREAST CYST EXCISION  ~ 1950   right  . BREAST LUMPECTOMY WITH NEEDLE LOCALIZATION Bilateral 02/03/2014   Procedure: BILATERAL BREAST LUMPECTOMY WITH NEEDLE LOCALIZATION ON LEFT;  Surgeon: Excell Seltzer, MD;  Location: [facility];  Service: General;  Laterality: Bilateral;  . CARDIOVASCULAR STRESS TEST  12/09/2004  . CATARACT EXTRACTION W/ INTRAOCULAR LENS  IMPLANT, BILATERAL  1990's  . [place]  . DILATION AND CURETTAGE OF UTERUS  1953  . INSERT / REPLACE / REMOVE PACEMAKER  03/16/2006  . PERMANENT PACEMAKER GENERATOR CHANGE  08/03/2011   Procedure: PERMANENT PACEMAKER GENERATOR CHANGE;  Surgeon: Thompson Grayer, MD;  Location: [facility] CATH LAB;  Service: Cardiovascular;;  . SKIN CANCER EXCISION  ?1960's   "between my breasts"  . TONSILLECTOMY     "school age"     Medications Prior to Admission  Medication Sig Dispense Refill Last Dose  . acetaminophen (TYLENOL) 500 MG tablet Take 500-1,000 mg by mouth every 6 (six) hours as needed for mild pain, moderate pain, fever or headache.   Past Week at Unknown time  .  amoxicillin-clavulanate (AUGMENTIN) 875-125 MG tablet Take 1 tablet by mouth 2 (two) times daily. Take 1 tablet BID for 7 days   06/03/2017 at Unknown time  . apixaban (ELIQUIS) 2.5 MG TABS tablet Take 1 tablet (2.5 mg total) by mouth 2 (two) times daily. 60 tablet 11 06/03/2017 at 2230  . azelastine (ASTELIN) 0.1 % nasal spray Place 1 spray into both nostrils daily as needed for rhinitis. Use in each nostril as directed   Past Week at Unknown time  . carvedilol (COREG) 6.25 MG tablet Take 1 tablet (6.25 mg total) 2 (two) times daily by mouth. 90 tablet 3 06/03/2017 at 2230  . CRESTOR 5 MG tablet Take 2.5 mg by  mouth every Monday, Wednesday, and Friday.   7 06/02/2017  . diltiazem (CARDIZEM CD) 240 MG 24 hr capsule Take 240 mg by mouth daily.    06/03/2017 at Unknown time  . estradiol (ESTRACE) 0.1 MG/GM vaginal cream Place 1 Applicatorful vaginally every Monday, Wednesday, and Friday.   06/02/2017  . Flaxseed, Linseed, (FLAX SEEDS PO) Take 2.5 mLs by mouth daily with breakfast.   06/03/2017 at Unknown time  . LORazepam (ATIVAN) 0.5 MG tablet Take 0.25-0.5 mg by mouth See admin instructions. Take 0.25 mg tablet in the morning and afternoon, then 0.5 mg at bedtime   06/04/2017 at Unknown time  . polyethylene glycol (MIRALAX / GLYCOLAX) packet Take 17 g by mouth daily as needed for mild constipation.    Unk  . sertraline (ZOLOFT) 50 MG tablet Take 50 mg by mouth daily.  3 06/03/2017 at Unknown time  . carvedilol (COREG) 6.25 MG tablet Take 1 tablet (6.25 mg total) by mouth 2 (two) times daily with a meal. (Patient not taking: Reported on 06/04/2017) 90 tablet 2 Not Taking at Unknown time  . linagliptin (TRADJENTA) 5 MG TABS tablet Take 1 tablet (5 mg total) by mouth daily. (Patient not taking: Reported on 06/04/2017) 30 tablet  Not Taking at Unknown time  . Vitamin D, Ergocalciferol, (DRISDOL) 50000 UNITS CAPS capsule Take 50,000 Units by mouth every Wednesday.   05/31/2017    Inpatient Medications:  . amoxicillin-clavulanate  1 tablet Oral BID  . apixaban  2.5 mg Oral BID  . carvedilol  6.25 mg Oral BID  . diltiazem  240 mg Oral Daily  . feeding supplement (ENSURE ENLIVE)  237 mL Oral BID BM  . furosemide  40 mg Intravenous Daily  . insulin aspart  0-9 Units Subcutaneous TID WC  . LORazepam  0.25 mg Oral q morning - 10a  . LORazepam  0.5 mg Oral QHS  . rosuvastatin  2.5 mg Oral Q M,W,F  . sertraline  50 mg Oral Daily  . sodium chloride flush  3 mL Intravenous Q12H  . [START ON 06/07/2017] Vitamin D (Ergocalciferol)  50,000 Units Oral Q Wed    Allergies:  Allergies  Allergen Reactions  . Sulfonamide  Derivatives Rash    "it was all over my legs"  . Citalopram Hydrobromide Other (See Comments)    Pt does not remember reaction    Social History   Socioeconomic History  . Marital status: Widowed    Spouse name: Not on file  . Number of children: 2  . Years of education: Not on file  . Highest education level: Not on file  Occupational History  . Not on file  Social Needs  . Financial resource strain: Not on file  . Food insecurity:    Worry: Not on file  Inability: Not on file  . Transportation needs:    Medical: Not on file    Non-medical: Not on file  Tobacco Use  . Smoking status: Never Smoker  . Smokeless tobacco: Never Used  Substance and Sexual Activity  . Alcohol use: No  . Drug use: No  . Sexual activity: Never  Lifestyle  . Physical activity:    Days per week: Not on file    Minutes per session: Not on file  . Stress: Not on file  Relationships  . Social connections:    Talks on phone: Not on file    Gets together: Not on file    Attends religious service: Not on file    Active member of club or organization: Not on file    Attends meetings of clubs or organizations: Not on file    Relationship status: Not on file  . Intimate partner violence:    Fear of current or ex partner: Not on file    Emotionally abused: Not on file    Physically abused: Not on file    Forced sexual activity: Not on file  Other Topics Concern  . Not on file  Social History Narrative   Lives alone     Family History  Problem Relation Age of Onset  . Emphysema Father      Review of Systems: All other systems reviewed and are otherwise negative except as noted above.  Physical Exam: Vitals:   06/05/17 2300 06/06/17 0606 06/06/17 0607 06/06/17 1434  BP:  101/72  (!) 101/49  Pulse: 68 (!) 40 69 70  Resp: 16 (!) 23  15  Temp:  98 F (36.7 C)  97.9 F (36.6 C)  TempSrc:  Oral  Oral  SpO2: 97% 94% 95% 97%  Weight:   107 lb (48.5 kg)   Height:        GEN- The  patient is elderly, frail, and chronically ill appearing, alert and oriented x 3 today, +HOH.   HEENT: normocephalic, atraumatic; sclera clear, conjunctiva pink; hearing intact; oropharynx clear; neck supple Lungs- Clear to ausculation bilaterally, normal work of breathing.  No wheezes, rales, rhonchi Heart- Regular rate and rhythm  GI- soft, non-tender, non-distended, bowel sounds present Extremities- no clubbing, cyanosis, or edema  MS- no significant deformity or atrophy Skin- warm and dry, no rash or lesion Psych- euthymic mood, full affect Neuro- strength and sensation are intact  Labs:   Lab Results  Component Value Date   WBC 13.4 (H) 06/05/2017   HGB 10.1 (L) 06/05/2017   HCT 32.3 (L) 06/05/2017   MCV 94.7 06/05/2017   PLT 242 06/05/2017    Recent Labs  Lab 06/06/17 0440  NA 141  K 4.2  CL 103  CO2 28  BUN 19  CREATININE 0.89  CALCIUM 8.9  GLUCOSE 175*      Radiology/Studies: Dg Chest 2 View  Result Date: 06/04/2017 CLINICAL DATA:  84 year old female with a history of shortness of breath EXAM: CHEST - 2 VIEW COMPARISON:  08/29/2016 FINDINGS: Cardiomediastinal silhouette unchanged in size and contour. Increasing interlobular septal thickening and patchy airspace opacity/interstitial opacity. Low lung volumes persist. Unchanged cardiac pacing device with 2 leads in place. No pneumothorax. Lateral view demonstrates blunting of the costophrenic sulcus. IMPRESSION: Chest x-ray demonstrates developing CHF with small pleural effusion. Electronically Signed   By: Corrie Mckusick D.O.   On: 06/04/2017 12:40    EKG:AF (personally reviewed)  TELEMETRY: AF -> SR (personally reviewed)  DEVICE HISTORY:  MDT dual chamber PPM implanted 2008 for SSS  Assessment/Plan: 1.  Persistent atrial fibrillation She has converted back to SR with improvement in symptoms She has previously failed Flecainide Device interrogation today demonstrates EF 12% Would start low dose amiodarone -  200mg  daily to help maintain SR If she has continued problems with orthostasis would stop Coreg Continue dose adjusted Eliquis for CHADS2VASC of 5  2.  Chronic diastolic heart failure Worsened by AF Symptomatically improved with SR  3.  SSS Normal device interrogation by me today  Follow up scheduled with AF clinic for 2 weeks.   Electrophysiology team to see as needed while here. Please call with questions.   Signed, Chanetta [place], NP 06/06/2017 3:05 PM   I have seen, examined the patient, and reviewed the above assessment and plan.  Changes to above are made where necessary.  On exam, RRR.  AF has converted to sinus rhythm.  Would stop coreg and add amiodarone 200mg  daily.  EP to follow.  Co Sign: Thompson Grayer, MD

## 2017-06-06 NOTE — NC FL2 (Signed)
[name] LEVEL OF CARE SCREENING TOOL     IDENTIFICATION  Patient Name: Jo [name] Birthdate: [birth year] Sex: female Admission Date (Current Location): 06/04/2017  [facility] and [place] Number:  Herbalist and Address:  [facility], [place] [address] [place], [place] [address]      Provider Number: [id number]  Attending Physician Name and Address:  Jonetta Osgood, MD  Relative Name and Phone Number:  Chrystie Nose, daughter, [phone]    Current Level of Care: Hospital Recommended Level of Care: [place] Prior Approval Number:    Date Approved/Denied:   PASRR Number: [id number]  Discharge Plan: SNF    Current Diagnoses: Patient Active Problem List   Diagnosis Date Noted  . Atrial fibrillation with rapid ventricular response ([place]) 06/04/2017  . UTI (urinary tract infection) 08/30/2016  . Weakness 08/29/2016  . Acute lower UTI 08/29/2016  . Abdominal distention   . CAP (community acquired pneumonia) 06/27/2015  . Sepsis ([place]) 06/27/2015  . Pacemaker 06/27/2015  . Moderate aortic insufficiency   . Chronic diastolic CHF (congestive heart failure) ([place])   . Breast cancer ([place]) 02/03/2014  . Dyslipidemia 08/15/2012  . Atrophic vaginitis 08/15/2012  . Constipation 08/15/2012  . Vitamin D intoxication 08/15/2012  . Acute on chronic diastolic HF (heart failure) ([place]) 07/06/2012  . Atrial fibrillation with RVR ([place]) 07/21/2011  . Diabetes mellitus, type II ([place])   . Panic attacks   . Anxiety   . PAF (paroxysmal atrial fibrillation) ([place])   . SSS (sick sinus syndrome) ([place])   . History of aortic insufficiency   . Hypertension     Orientation RESPIRATION BLADDER Height & Weight     Self, Time, Situation, Place  O2(nasal cannula 1L) Incontinent, External catheter Weight: 107 lb (48.5 kg) Height:  5\' 2"  (157.5 cm)  BEHAVIORAL SYMPTOMS/MOOD NEUROLOGICAL BOWEL NUTRITION STATUS      Continent  Diet(please see DC summary)  AMBULATORY STATUS COMMUNICATION OF NEEDS Skin   Extensive Assist Verbally Normal                       Personal Care Assistance Level of Assistance  Bathing, Feeding, Dressing Bathing Assistance: Limited assistance Feeding assistance: Independent Dressing Assistance: Limited assistance     Functional Limitations Info  Sight, Hearing, Speech Sight Info: Adequate Hearing Info: Impaired Speech Info: Adequate    SPECIAL CARE FACTORS FREQUENCY  PT (By licensed PT)     PT Frequency: 5x/week              Contractures Contractures Info: Not present    Additional Factors Info  Code Status, Allergies, Psychotropic, Insulin Sliding Scale Code Status Info: DNR Allergies Info: Sulfonamide Derivatives, Citalopram Hydrobromide Psychotropic Info: zoloft Insulin Sliding Scale Info: novolog 3x/day with meals       Current Medications (06/06/2017):  This is the current hospital active medication list Current Facility-Administered Medications  Medication Dose Route Frequency Provider Last Rate Last Dose  . 0.9 %  sodium chloride infusion  250 mL Intravenous PRN Purohit, Shrey C, MD      . acetaminophen (TYLENOL) tablet 650 mg  650 mg Oral Q4H PRN Purohit, Shrey C, MD      . amoxicillin-clavulanate (AUGMENTIN) 875-125 MG per tablet 1 tablet  1 tablet Oral BID Purohit, Konrad Dolores, MD   1 tablet at 06/06/17 0821  . apixaban (ELIQUIS) tablet 2.5 mg  2.5 mg Oral BID Purohit, Konrad Dolores, MD  2.5 mg at 06/06/17 0820  . azelastine (ASTELIN) 0.1 % nasal spray 1 spray  1 spray Each Nare Daily PRN Purohit, Shrey C, MD      . carvedilol (COREG) tablet 6.25 mg  6.25 mg Oral BID Purohit, Shrey C, MD   6.25 mg at 06/06/17 0821  . diltiazem (CARDIZEM CD) 24 hr capsule 240 mg  240 mg Oral Daily Purohit, Shrey C, MD   240 mg at 06/06/17 0717  . feeding supplement (ENSURE ENLIVE) (ENSURE ENLIVE) liquid 237 mL  237 mL Oral BID BM Ghimire, Shanker M, MD      . furosemide (LASIX)  injection 40 mg  40 mg Intravenous Daily Jonetta Osgood, MD   40 mg at 06/06/17 7915  . insulin aspart (novoLOG) injection 0-9 Units  0-9 Units Subcutaneous TID WC Purohit, Konrad Dolores, MD   2 Units at 06/06/17 [phone]  . LORazepam (ATIVAN) tablet 0.25 mg  0.25 mg Oral q morning - 10a Ghimire, Henreitta Leber, MD   0.25 mg at 06/06/17 7948  . LORazepam (ATIVAN) tablet 0.5 mg  0.5 mg Oral QHS Purohit, Shrey C, MD   0.5 mg at 06/05/17 2144  . ondansetron (ZOFRAN) injection 4 mg  4 mg Intravenous Q6H PRN Purohit, Shrey C, MD      . polyethylene glycol (MIRALAX / GLYCOLAX) packet 17 g  17 g Oral Daily PRN Purohit, Konrad Dolores, MD   17 g at 06/05/17 2032  . rosuvastatin (CRESTOR) tablet 2.5 mg  2.5 mg Oral Q M,W,F Purohit, Shrey C, MD   2.5 mg at 06/05/17 0954  . sertraline (ZOLOFT) tablet 50 mg  50 mg Oral Daily Purohit, Shrey C, MD   50 mg at 06/06/17 0821  . sodium chloride flush (NS) 0.9 % injection 3 mL  3 mL Intravenous Q12H Purohit, Shrey C, MD   3 mL at 06/05/17 2155  . sodium chloride flush (NS) 0.9 % injection 3 mL  3 mL Intravenous PRN Purohit, Konrad Dolores, MD      . Derrill Memo ON 06/07/2017] Vitamin D (Ergocalciferol) (DRISDOL) capsule 50,000 Units  50,000 Units Oral Q Wed Purohit, Shrey C, MD      . white petrolatum (VASELINE) gel              Discharge Medications: Please see discharge summary for a list of discharge medications.  Relevant Imaging Results:  Relevant Lab Results:   Additional Information SSN: [id number]  Estanislado Emms, LCSW

## 2017-06-07 DIAGNOSIS — Z87442 Personal history of urinary calculi: Secondary | ICD-10-CM | POA: Diagnosis not present

## 2017-06-07 DIAGNOSIS — I110 Hypertensive heart disease with heart failure: Secondary | ICD-10-CM | POA: Diagnosis not present

## 2017-06-07 DIAGNOSIS — Z85828 Personal history of other malignant neoplasm of skin: Secondary | ICD-10-CM | POA: Diagnosis not present

## 2017-06-07 DIAGNOSIS — Z79899 Other long term (current) drug therapy: Secondary | ICD-10-CM | POA: Diagnosis not present

## 2017-06-07 DIAGNOSIS — I480 Paroxysmal atrial fibrillation: Secondary | ICD-10-CM | POA: Diagnosis not present

## 2017-06-07 DIAGNOSIS — G8911 Acute pain due to trauma: Secondary | ICD-10-CM | POA: Diagnosis not present

## 2017-06-07 DIAGNOSIS — I351 Nonrheumatic aortic (valve) insufficiency: Secondary | ICD-10-CM | POA: Diagnosis not present

## 2017-06-07 DIAGNOSIS — M2550 Pain in unspecified joint: Secondary | ICD-10-CM | POA: Diagnosis not present

## 2017-06-07 DIAGNOSIS — Z9841 Cataract extraction status, right eye: Secondary | ICD-10-CM | POA: Diagnosis not present

## 2017-06-07 DIAGNOSIS — I5033 Acute on chronic diastolic (congestive) heart failure: Secondary | ICD-10-CM | POA: Diagnosis not present

## 2017-06-07 DIAGNOSIS — Z9889 Other specified postprocedural states: Secondary | ICD-10-CM | POA: Diagnosis not present

## 2017-06-07 DIAGNOSIS — R0602 Shortness of breath: Secondary | ICD-10-CM | POA: Diagnosis not present

## 2017-06-07 DIAGNOSIS — I10 Essential (primary) hypertension: Secondary | ICD-10-CM | POA: Diagnosis not present

## 2017-06-07 DIAGNOSIS — Z9842 Cataract extraction status, left eye: Secondary | ICD-10-CM | POA: Diagnosis not present

## 2017-06-07 DIAGNOSIS — K5900 Constipation, unspecified: Secondary | ICD-10-CM | POA: Diagnosis not present

## 2017-06-07 DIAGNOSIS — R278 Other lack of coordination: Secondary | ICD-10-CM | POA: Diagnosis not present

## 2017-06-07 DIAGNOSIS — E139 Other specified diabetes mellitus without complications: Secondary | ICD-10-CM | POA: Diagnosis not present

## 2017-06-07 DIAGNOSIS — I5032 Chronic diastolic (congestive) heart failure: Secondary | ICD-10-CM | POA: Diagnosis not present

## 2017-06-07 DIAGNOSIS — F329 Major depressive disorder, single episode, unspecified: Secondary | ICD-10-CM | POA: Diagnosis not present

## 2017-06-07 DIAGNOSIS — E785 Hyperlipidemia, unspecified: Secondary | ICD-10-CM | POA: Diagnosis not present

## 2017-06-07 DIAGNOSIS — Z882 Allergy status to sulfonamides status: Secondary | ICD-10-CM | POA: Diagnosis not present

## 2017-06-07 DIAGNOSIS — I4891 Unspecified atrial fibrillation: Secondary | ICD-10-CM | POA: Diagnosis not present

## 2017-06-07 DIAGNOSIS — J302 Other seasonal allergic rhinitis: Secondary | ICD-10-CM | POA: Diagnosis not present

## 2017-06-07 DIAGNOSIS — M6281 Muscle weakness (generalized): Secondary | ICD-10-CM | POA: Diagnosis not present

## 2017-06-07 DIAGNOSIS — Z7401 Bed confinement status: Secondary | ICD-10-CM | POA: Diagnosis not present

## 2017-06-07 DIAGNOSIS — Z950 Presence of cardiac pacemaker: Secondary | ICD-10-CM | POA: Diagnosis not present

## 2017-06-07 DIAGNOSIS — Z7901 Long term (current) use of anticoagulants: Secondary | ICD-10-CM | POA: Diagnosis not present

## 2017-06-07 DIAGNOSIS — R0902 Hypoxemia: Secondary | ICD-10-CM | POA: Diagnosis not present

## 2017-06-07 DIAGNOSIS — F419 Anxiety disorder, unspecified: Secondary | ICD-10-CM | POA: Diagnosis not present

## 2017-06-07 DIAGNOSIS — Z825 Family history of asthma and other chronic lower respiratory diseases: Secondary | ICD-10-CM | POA: Diagnosis not present

## 2017-06-07 DIAGNOSIS — R2681 Unsteadiness on feet: Secondary | ICD-10-CM | POA: Diagnosis not present

## 2017-06-07 DIAGNOSIS — E119 Type 2 diabetes mellitus without complications: Secondary | ICD-10-CM | POA: Diagnosis not present

## 2017-06-07 LAB — GLUCOSE, CAPILLARY
Glucose-Capillary: 190 mg/dL — ABNORMAL HIGH (ref 65–99)
Glucose-Capillary: 182 mg/dL — ABNORMAL HIGH (ref 65–99)

## 2017-06-07 MED ORDER — LORAZEPAM 0.5 MG PO TABS: 0.2500 mg | ORAL_TABLET | ORAL | 0 refills | Status: DC

## 2017-06-07 MED ORDER — FUROSEMIDE 40 MG PO TABS: 20.0000 mg | ORAL_TABLET | Freq: Every day | ORAL | Status: DC

## 2017-06-07 MED ORDER — AMIODARONE HCL 200 MG PO TABS: 200.0000 mg | ORAL_TABLET | Freq: Every day | ORAL | Status: DC

## 2017-06-07 MED ORDER — ENSURE ENLIVE PO LIQD: 237.0000 mL | Freq: Two times a day (BID) | ORAL | 12 refills | Status: AC

## 2017-06-07 NOTE — Plan of Care (Signed)
  Problem: Education: Goal: Ability to demonstrate management of disease process will improve Outcome: Adequate for Discharge Goal: Ability to verbalize understanding of medication therapies will improve Outcome: Adequate for Discharge   Problem: Activity: Goal: Capacity to carry out activities will improve Outcome: Adequate for Discharge   Problem: Cardiac: Goal: Ability to achieve and maintain adequate cardiopulmonary perfusion will improve Outcome: Adequate for Discharge   Problem: Education: Goal: Knowledge of General Education information will improve Outcome: Adequate for Discharge   Problem: Health Behavior/Discharge Planning: Goal: Ability to manage health-related needs will improve Outcome: Adequate for Discharge   Problem: Clinical Measurements: Goal: Ability to maintain clinical measurements within normal limits will improve Outcome: Adequate for Discharge Goal: Will remain free from infection Outcome: Adequate for Discharge Goal: Diagnostic test results will improve Outcome: Adequate for Discharge Goal: Respiratory complications will improve Outcome: Adequate for Discharge Goal: Cardiovascular complication will be avoided Outcome: Adequate for Discharge   Problem: Activity: Goal: Risk for activity intolerance will decrease Outcome: Adequate for Discharge   Problem: Nutrition: Goal: Adequate nutrition will be maintained Outcome: Adequate for Discharge   Problem: Coping: Goal: Level of anxiety will decrease Outcome: Adequate for Discharge   Problem: Elimination: Goal: Will not experience complications related to bowel motility Outcome: Adequate for Discharge Goal: Will not experience complications related to urinary retention Outcome: Adequate for Discharge   Problem: Pain Managment: Goal: General experience of comfort will improve Outcome: Adequate for Discharge   Problem: Safety: Goal: Ability to remain free from injury will improve Outcome:  Adequate for Discharge   Problem: Skin Integrity: Goal: Risk for impaired skin integrity will decrease Outcome: Adequate for Discharge   Problem: Education: Goal: Knowledge of disease or condition will improve Outcome: Adequate for Discharge Goal: Understanding of medication regimen will improve Outcome: Adequate for Discharge   Problem: Activity: Goal: Ability to tolerate increased activity will improve Outcome: Adequate for Discharge   Problem: Cardiac: Goal: Ability to achieve and maintain adequate cardiopulmonary perfusion will improve Outcome: Adequate for Discharge   Problem: Health Behavior/Discharge Planning: Goal: Ability to safely manage health-related needs after discharge will improve Outcome: Adequate for Discharge

## 2017-06-07 NOTE — Discharge Summary (Addendum)
PATIENT DETAILS Name: Jo [name] Age: 84 y.o. Sex: female Date of Birth: 1917-09-04 MRN: [id number]. Admitting Physician: Cristy Folks, MD DJS:HFWYOV, Elta Gu[place], MD  Admit Date: 06/04/2017 Discharge date: 06/07/2017  Recommendations for Outpatient Follow-up:  1. Follow up with PCP in 1-2 weeks 2. Please obtain BMP/CBC in one week 3. Please ensure follow-up at the atrial fibrillation clinic in 1 week.  Admitted From:  Home  Disposition: SNF   Home Health: No  Equipment/Devices: None  Discharge Condition: Stable  CODE STATUS:  DNR  Diet recommendation:  Heart Healthy / Carb Modified  Brief Summary: See H&P, Labs, Consult and Test reports for all details in brief, Patient is a 84 y.o. female with prior history of sick sinus syndrome status post pacemaker placement, paroxysmal atrial fibrillation, chronic diastolic heart failure who presented with shortness of breath, found to have acute on chronic diastolic heart failure and atrial fibrillation with RVR.  Brief Hospital Course: Atrial fibrillation with RVR: Initially required Cardizem infusion-once her rate was better controlled she was transitioned to oral Cardizem and Coreg.  She continued to be in persistent atrial fibrillation-hence electrophysiology was consulted-patient was then started on amiodarone.  She continues to go in and out of A. fib-but at the time of discharge she was in sinus rhythm.  Seen by EP earlier today, okay to discharge-they will arrange for a quick follow-up at the atrial fibrillation clinic.  Since she continues to be orthostatic-will stop beta blocker today. Continue full dose anticoagulation with Eliquis.  Acute on chronic diastolic heart failure: Likely provoked by above-improved-initially on IV Lasix-she has been transitioned to oral Lasix-volume status looks good-do not think she requires diuretics at this time-she is still orthostatic.Please re-assess volume status closely at SNF and restart  Lasix prn.   Acute hypoxic respiratory failure: Secondary to compensated diastolic heart failure-she has been titrated off oxygen and was on room air this morning.    DM-2: CBG stable with SSI-resume oral hypoglycemic agents on discharge.  Dyslipidemia: Continue statin  Anxiety/depression: Appears to be stable-continue Zoloft and lorazepam.  Procedures/Studies: None  Discharge Diagnoses:  Active Problems:   PAF (paroxysmal atrial fibrillation) (HCC)   Atrial fibrillation with RVR (HCC)   Diabetes mellitus, type II (HCC)   Dyslipidemia   Constipation   Chronic diastolic CHF (congestive heart failure) ([place])   Pacemaker   Atrial fibrillation with rapid ventricular response [place])   Discharge Instructions:  Activity:  As tolerated with Full fall precautions use walker/cane & assistance as needed  Discharge Instructions    (HEART FAILURE PATIENTS) Call MD:  Anytime you have any of the following symptoms: 1) 3 pound weight gain in 24 hours or 5 pounds in 1 week 2) shortness of breath, with or without a dry hacking cough 3) swelling in the hands, feet or stomach 4) if you have to sleep on extra pillows at night in order to breathe.   Complete by:  As directed    Diet - low sodium heart healthy   Complete by:  As directed    Diet Carb Modified   Complete by:  As directed    Discharge instructions   Complete by:  As directed    Follow with Primary MD  Crist Infante, MD in 1 week  Follow at the atrial fibrillation clinic in 1 week  Please get a complete blood count and chemistry panel checked by your Primary MD at your next visit, and again as instructed by your Primary MD.  Get  Medicines reviewed and adjusted: Please take all your medications with you for your next visit with your Primary MD  Laboratory/radiological data: Please request your Primary MD to go over all hospital tests and procedure/radiological results at the follow up, please ask your Primary MD to get all  Hospital records sent to his/her office.  In some cases, they will be blood work, cultures and biopsy results pending at the time of your discharge. Please request that your primary care M.D. follows up on these results.  Also Note the following: If you experience worsening of your admission symptoms, develop shortness of breath, life threatening emergency, suicidal or homicidal thoughts you must seek medical attention immediately by calling 911 or calling your MD immediately  if symptoms less severe.  You must read complete instructions/literature along with all the possible adverse reactions/side effects for all the Medicines you take and that have been prescribed to you. Take any new Medicines after you have completely understood and accpet all the possible adverse reactions/side effects.   Do not drive when taking Pain medications or sleeping medications (Benzodaizepines)  Do not take more than prescribed Pain, Sleep and Anxiety Medications. It is not advisable to combine anxiety,sleep and pain medications without talking with your primary care practitioner  Special Instructions: If you have smoked or chewed Tobacco  in the last 2 yrs please stop smoking, stop any regular Alcohol  and or any Recreational drug use.  Wear Seat belts while driving.  Please note: You were cared for by a hospitalist during your hospital stay. Once you are discharged, your primary care physician will handle any further medical issues. Please note that NO REFILLS for any discharge medications will be authorized once you are discharged, as it is imperative that you return to your primary care physician (or establish a relationship with a primary care physician if you do not have one) for your post hospital discharge needs so that they can reassess your need for medications and monitor your lab values.   Increase activity slowly   Complete by:  As directed      Allergies as of 06/07/2017      Reactions    Sulfonamide Derivatives Rash   "it was all over my legs"   Citalopram Hydrobromide Other (See Comments)   Pt does not remember reaction      Medication List    STOP taking these medications   amoxicillin-clavulanate 875-125 MG tablet Commonly known as:  AUGMENTIN   carvedilol 6.25 MG tablet Commonly known as:  COREG     TAKE these medications   acetaminophen 500 MG tablet Commonly known as:  TYLENOL Take 500-1,000 mg by mouth every 6 (six) hours as needed for mild pain, moderate pain, fever or headache.   amiodarone 200 MG tablet Commonly known as:  PACERONE Take 1 tablet (200 mg total) by mouth daily. Start taking on:  06/08/2017   apixaban 2.5 MG Tabs tablet Commonly known as:  ELIQUIS Take 1 tablet (2.5 mg total) by mouth 2 (two) times daily.   azelastine 0.1 % nasal spray Commonly known as:  ASTELIN Place 1 spray into both nostrils daily as needed for rhinitis. Use in each nostril as directed   CRESTOR 5 MG tablet Generic drug:  rosuvastatin Take 2.5 mg by mouth every Monday, Wednesday, and Friday.   diltiazem 240 MG 24 hr capsule Commonly known as:  CARDIZEM CD Take 240 mg by mouth daily.   estradiol 0.1 MG/GM vaginal cream Commonly known as:  ESTRACE Place 1 Applicatorful vaginally every Monday, Wednesday, and Friday.   feeding supplement (ENSURE ENLIVE) Liqd Take 237 mLs by mouth 2 (two) times daily between meals.   FLAX SEEDS PO Take 2.5 mLs by mouth daily with breakfast.   linagliptin 5 MG Tabs tablet Commonly known as:  TRADJENTA Take 1 tablet (5 mg total) by mouth daily.   LORazepam 0.5 MG tablet Commonly known as:  ATIVAN Take 0.5-1 tablets (0.25-0.5 mg total) by mouth See admin instructions. Take 0.25 mg tablet in the morning and afternoon, then 0.5 mg at bedtime   polyethylene glycol packet Commonly known as:  MIRALAX / GLYCOLAX Take 17 g by mouth daily as needed for mild constipation.   sertraline 50 MG tablet Commonly known as:   ZOLOFT Take 50 mg by mouth daily.   Vitamin D (Ergocalciferol) 50000 units Caps capsule Commonly known as:  DRISDOL Take 50,000 Units by mouth every Wednesday.       Contact information for follow-up providers    [facility] Follow up on 06/20/2017.   Specialty:  Cardiology Why:  at 1:30PM  Contact information: [address] [phone] [place]ath[place]       Crist Infante, MD. Schedule an appointment as soon as possible for a visit in 1 week(s).   Specialty:  Internal Medicine Contact information: [place] [address] [phone]            Contact information for after-discharge care    Destination    [facility] .   Service:  Skilled Nursing Contact information: [address] [place] [phone]                 Allergies  Allergen Reactions  . Sulfonamide Derivatives Rash    "it was all over my legs"  . Citalopram Hydrobromide Other (See Comments)    Pt does not remember reaction    Consultations:   cardiology  Other Procedures/Studies: Dg Chest 2 View  Result Date: 06/04/2017 CLINICAL DATA:  84 year old female with a history of shortness of breath EXAM: CHEST - 2 VIEW COMPARISON:  08/29/2016 FINDINGS: Cardiomediastinal silhouette unchanged in size and contour. Increasing interlobular septal thickening and patchy airspace opacity/interstitial opacity. Low lung volumes persist. Unchanged cardiac pacing device with 2 leads in place. No pneumothorax. Lateral view demonstrates blunting of the costophrenic sulcus. IMPRESSION: Chest x-ray demonstrates developing CHF with small pleural effusion. Electronically Signed   By: Corrie Mckusick D.O.   On: 06/04/2017 12:40     TODAY-DAY OF DISCHARGE:  Subjective:   [place] Cien[place] today has no headache,no chest abdominal pain,no new weakness tingling or numbness, feels much better wants to  go home today.  Objective:   Blood pressure 105/84, pulse (!) 46, temperature 97.8 F (36.6 C), temperature source Oral, resp. rate (!) 22, height 5\' 2"  (1.575 m), weight 48.1 kg (106 lb 0.7 oz), SpO2 95 %.  Intake/Output Summary (Last 24 hours) at 06/07/2017 1049 Last data filed at 06/07/2017 0600 Gross per 24 hour  Intake 960 ml  Output 500 ml  Net 460 ml   Filed Weights   06/05/17 0424 06/06/17 0607 06/07/17 0502  Weight: 48.9 kg (107 lb 14.4 oz) 48.5 kg (107 lb) 48.1 kg (106 lb 0.7 oz)    Exam: Awake Alert, Oriented *3, No new F.N deficits, Normal affect Gibbs.AT,PERRAL Supple Neck,No JVD, No cervical lymphadenopathy appriciated.  Symmetrical Chest wall movement, Good air movement bilaterally,  CTAB RRR,No Gallops,Rubs or new Murmurs, No Parasternal Heave +ve B.Sounds, Abd Soft, Non tender, No organomegaly appriciated, No rebound -guarding or rigidity. No Cyanosis, Clubbing or edema, No new Rash or bruise   PERTINENT RADIOLOGIC STUDIES: Dg Chest 2 View  Result Date: 06/04/2017 CLINICAL DATA:  84 year old female with a history of shortness of breath EXAM: CHEST - 2 VIEW COMPARISON:  08/29/2016 FINDINGS: Cardiomediastinal silhouette unchanged in size and contour. Increasing interlobular septal thickening and patchy airspace opacity/interstitial opacity. Low lung volumes persist. Unchanged cardiac pacing device with 2 leads in place. No pneumothorax. Lateral view demonstrates blunting of the costophrenic sulcus. IMPRESSION: Chest x-ray demonstrates developing CHF with small pleural effusion. Electronically Signed   By: Corrie Mckusick D.O.   On: 06/04/2017 12:40     PERTINENT LAB RESULTS: CBC: Recent Labs    06/04/17 1224 06/05/17 0421  WBC 15.1* 13.4*  HGB 10.8* 10.1*  HCT 32.7* 32.3*  PLT 217 242   CMET CMP     Component Value Date/Time   NA 141 06/06/2017 0440   NA 142 07/06/2015   K 4.2 06/06/2017 0440   CL 103 06/06/2017 0440   CO2 28 06/06/2017 0440   GLUCOSE 175  (H) 06/06/2017 0440   BUN 19 06/06/2017 0440   BUN 21 07/06/2015   CREATININE 0.89 06/06/2017 0440   CALCIUM 8.9 06/06/2017 0440   PROT 6.7 11/24/2016 1445   ALBUMIN 3.8 11/24/2016 1445   AST 16 11/24/2016 1445   ALT 15 11/24/2016 1445   ALKPHOS 64 11/24/2016 1445   BILITOT 0.6 11/24/2016 1445   GFRNONAA 53 (L) 06/06/2017 0440   GFRAA >60 06/06/2017 0440    GFR Estimated Creatinine Clearance: 27.4 mL/min (by C-G formula based on SCr of 0.89 mg/dL). No results for input(s): LIPASE, AMYLASE in the last 72 hours. No results for input(s): CKTOTAL, CKMB, CKMBINDEX, TROPONINI in the last 72 hours. Invalid input(s): POCBNP No results for input(s): DDIMER in the last 72 hours. No results for input(s): HGBA1C in the last 72 hours. No results for input(s): CHOL, HDL, LDLCALC, TRIG, CHOLHDL, LDLDIRECT in the last 72 hours. No results for input(s): TSH, T4TOTAL, T3FREE, THYROIDAB in the last 72 hours.  Invalid input(s): FREET3 No results for input(s): VITAMINB12, FOLATE, FERRITIN, TIBC, IRON, RETICCTPCT in the last 72 hours. Coags: No results for input(s): INR in the last 72 hours.  Invalid input(s): PT Microbiology: No results found for this or any previous visit (from the past 240 hour(s)).  FURTHER DISCHARGE INSTRUCTIONS:  Get Medicines reviewed and adjusted: Please take all your medications with you for your next visit with your Primary MD  Laboratory/radiological data: Please request your Primary MD to go over all hospital tests and procedure/radiological results at the follow up, please ask your Primary MD to get all Hospital records sent to his/her office.  In some cases, they will be blood work, cultures and biopsy results pending at the time of your discharge. Please request that your primary care M.D. goes through all the records of your hospital data and follows up on these results.  Also Note the following: If you experience worsening of your admission symptoms, develop  shortness of breath, life threatening emergency, suicidal or homicidal thoughts you must seek medical attention immediately by calling 911 or calling your MD immediately  if symptoms less severe.  You must read complete instructions/literature along with all the possible adverse reactions/side effects for all the Medicines you take and that have been prescribed to you. Take any  new Medicines after you have completely understood and accpet all the possible adverse reactions/side effects.   Do not drive when taking Pain medications or sleeping medications (Benzodaizepines)  Do not take more than prescribed Pain, Sleep and Anxiety Medications. It is not advisable to combine anxiety,sleep and pain medications without talking with your primary care practitioner  Special Instructions: If you have smoked or chewed Tobacco  in the last 2 yrs please stop smoking, stop any regular Alcohol  and or any Recreational drug use.  Wear Seat belts while driving.  Please note: You were cared for by a hospitalist during your hospital stay. Once you are discharged, your primary care physician will handle any further medical issues. Please note that NO REFILLS for any discharge medications will be authorized once you are discharged, as it is imperative that you return to your primary care physician (or establish a relationship with a primary care physician if you do not have one) for your post hospital discharge needs so that they can reassess your need for medications and monitor your lab values.  Total Time spent coordinating discharge including counseling, education and face to face time equals  45 minutes.  SignedOren Binet 06/07/2017 10:49 AM

## 2017-06-07 NOTE — Clinical Social Work Placement (Signed)
   CLINICAL SOCIAL WORK PLACEMENT  NOTE  Date:  06/07/2017  Patient Details  Name: Jo [name] MRN: [id number] Date of Birth: [name] 30, 1919  Clinical Social Work is seeking post-discharge placement for this patient at the [place] level of care (*CSW will initial, date and re-position this form in  chart as items are completed):  Yes   Patient/family provided with [place] Work Department's list of facilities offering this level of care within the geographic area requested by the patient (or if unable, by the patient's family).  Yes   Patient/family informed of their freedom to choose among providers that offer the needed level of care, that participate in Medicare, Medicaid or managed care program needed by the patient, have an available bed and are willing to accept the patient.  Yes   Patient/family informed of [facility]'s ownership interest in [facility] and [facility], as well as of the fact that they are under no obligation to receive care at these facilities.  PASRR submitted to EDS on       PASRR number received on       Existing PASRR number confirmed on 06/06/17     FL2 transmitted to all facilities in geographic area requested by pt/family on 06/06/17     FL2 transmitted to all facilities within larger geographic area on       Patient informed that his/her managed care company has contracts with or will negotiate with certain facilities, including the following:  [name]     Yes   Patient/family informed of bed offers received.  Patient chooses bed at [facility]     Physician recommends and patient chooses bed at      Patient to be transferred to [facility] on 06/07/17.  Patient to be transferred to facility by [facility]     Patient family notified on 06/07/17 of transfer.  Name of family member notified:        PHYSICIAN Please prepare priority discharge summary, including medications, Please prepare  prescriptions, Please sign DNR     Additional Comment:    _______________________________________________ Estanislado Emms, LCSW 06/07/2017, 10:07 AM

## 2017-06-07 NOTE — Progress Notes (Addendum)
Electrophysiology Rounding Note  Patient Name: Jo [name] Date of Encounter: 06/07/2017  Primary Cardiologist: [place] Electrophysiologist: Quamir Willemsen   Subjective   The patient is doing well today.  At this time, the patient denies chest pain, shortness of breath, or any new concerns. She has some palpitations but not the awareness she had before of AF  Inpatient Medications    Scheduled Meds: . amiodarone  200 mg Oral Daily  . amoxicillin-clavulanate  1 tablet Oral BID  . apixaban  2.5 mg Oral BID  . carvedilol  6.25 mg Oral BID  . diltiazem  240 mg Oral Daily  . feeding supplement (ENSURE ENLIVE)  237 mL Oral BID BM  . furosemide  40 mg Oral Daily  . insulin aspart  0-9 Units Subcutaneous TID WC  . LORazepam  0.25 mg Oral q morning - 10a  . LORazepam  0.5 mg Oral QHS  . rosuvastatin  2.5 mg Oral Q M,W,F  . sertraline  50 mg Oral Daily  . sodium chloride flush  3 mL Intravenous Q12H  . Vitamin D (Ergocalciferol)  50,000 Units Oral Q Wed   Continuous Infusions: . sodium chloride     PRN Meds: sodium chloride, acetaminophen, azelastine, ondansetron (ZOFRAN) IV, polyethylene glycol, sodium chloride flush   Vital Signs    Vitals:   06/06/17 0607 06/06/17 1434 06/06/17 2042 06/07/17 0502  BP:  (!) 101/49 (!) 140/59 105/84  Pulse: 69 70 78 (!) 46  Resp:  15 18 (!) 22  Temp:  97.9 F (36.6 C) 97.8 F (36.6 C) 97.8 F (36.6 C)  TempSrc:  Oral Oral Oral  SpO2: 95% 97% 97% 95%  Weight: 107 lb (48.5 kg)   106 lb 0.7 oz (48.1 kg)  Height:        Intake/Output Summary (Last 24 hours) at 06/07/2017 0804 Last data filed at 06/07/2017 0600 Gross per 24 hour  Intake 1200 ml  Output 500 ml  Net 700 ml   Filed Weights   06/05/17 0424 06/06/17 0607 06/07/17 0502  Weight: 107 lb 14.4 oz (48.9 kg) 107 lb (48.5 kg) 106 lb 0.7 oz (48.1 kg)    Physical Exam    GEN- The patient is elderly, frail appearing, alert and oriented x 3 today.   Head- normocephalic,  atraumatic Eyes-  Sclera clear, conjunctiva pink Ears- hearing intact Oropharynx- clear Neck- supple Lungs- Clear to ausculation bilaterally, normal work of breathing Heart- Irregular rate and rhythm  GI- soft, NT, ND, + BS Extremities- no clubbing, cyanosis, or edema Skin- no rash or lesion Psych- euthymic mood, full affect Neuro- strength and sensation are intact  Labs    CBC Recent Labs    06/04/17 1224 06/05/17 0421  WBC 15.1* 13.4*  NEUTROABS 12.2* 10.1*  HGB 10.8* 10.1*  HCT 32.7* 32.3*  MCV 94.2 94.7  PLT 217 948   Basic Metabolic Panel Recent Labs    06/05/17 0421 06/06/17 0440  NA 140 141  K 3.5 4.2  CL 103 103  CO2 30 28  GLUCOSE 165* 175*  BUN 14 19  CREATININE 0.81 0.89  CALCIUM 8.8* 8.9  MG 2.0  --      Telemetry    SR -> AF (personally reviewed)  Radiology    No results found.   Assessment & Plan    1.  Persistent AF Back in AF this morning Continue amiodarone loading (200mg  daily) Continue Diltiazem and Coreg for now Continue dose adjusted Eliquis for CHADS2VASC of 5  2.  Chronic diastolic heart failure Maintaining SR will help As above Lying flat in bed today  3.  SSS Normal device function this admission  Ok from our standpoint to discharge home. She has had intermittent AF for the last several months by device interrogation. Burden increased recently which likely has led to acute on chronic diastolic heart failure.  Amiodarone loading will help maintain SR over time. Early follow up scheduled with AF clinic.   Signed, Chanetta Marshall, NP  06/07/2017, 8:04 AM    I have seen, examined the patient, and reviewed the above assessment and plan.  Changes to above are made where necessary.  On exam, RRR.  Though she did have afib overnight, she is now back in sinus.  Continue current plan with amiodarone.  Co Sign: Thompson Grayer, MD 06/07/2017

## 2017-06-07 NOTE — Consult Note (Signed)
[facility] CM Primary Care Navigator  06/07/2017  Jo [name] 06/25/1917 [id number]   Met withpatient at the bedside toidentify possible discharge needs. Patient reports that "heart got out of rhythm" and shortness of breath that resulted to thisadmission. (persistent atrial fibrillation with rapid ventricular response that likely provoked acute on chronic diastolic HF).  PatientendorsesDr.Mark Perini with [facility] astheprimary care provider.   [facility] [place] [facility].  Patientreports thatshehas beenmanaginghermedications at homewith use of "pill box" system filled once a week.  Her son (Jo [name]) and daughter(Jo [name]) have been providing transportationto herdoctors'appointments.  Patientverbalized that her children are theprimary caregivers for her at home.  Anticipated plan for discharge isskilled nursing facility (SNF)for rehabilitationper therapy recommendation.  Patientvoiced understandingto callprimarycareprovider'soffice whenshereturns backhome,for a post discharge follow-upvisitwithin1- 2 weeksor sooner if needs arise.Patient letter (with PCP's contact number) was provided asareminder.   Explained topatient regarding [facility] CM services available for health management/ resourcesat homeand she voiced interest for it.She plans to discuss with primary care provider on her next visit about further needs in managing her health issues when back home.   Patientverbalizedunderstandingto seekreferral from primary care provider to [facility] care management ifdeemed necessary and appropriatefor anyservicesin the nearfuture,once shereturns back home.   [facility] care management information was provided for futureneeds thatsheSalido have.  Primary care provider's office is listed as providing transition of care (TOC) follow-up.   For additional  questions please contact:   A. , BSN, RN-BC [facility] PRIMARY CARE Navigator Cell: [phone] 

## 2017-06-07 NOTE — Progress Notes (Signed)
Patient will discharge to [facility]  Anticipated discharge date: 06/07/17 Family notified: Chrystie Nose, daughter Transportation by: Corey Harold  Nurse to call report to [phone]. Patient will go to room 1202 at the facility.   CSW signing off.  Estanislado Emms, [place]  Clinical Social Worker

## 2017-06-08 DIAGNOSIS — F419 Anxiety disorder, unspecified: Secondary | ICD-10-CM | POA: Diagnosis not present

## 2017-06-08 DIAGNOSIS — I10 Essential (primary) hypertension: Secondary | ICD-10-CM | POA: Diagnosis not present

## 2017-06-08 DIAGNOSIS — E139 Other specified diabetes mellitus without complications: Secondary | ICD-10-CM | POA: Diagnosis not present

## 2017-06-08 DIAGNOSIS — I480 Paroxysmal atrial fibrillation: Secondary | ICD-10-CM | POA: Diagnosis not present

## 2017-06-13 LAB — CUP PACEART REMOTE DEVICE CHECK
Battery Impedance: 280 Ohm
Brady Statistic AP VP Percent: 2 %
Brady Statistic AP VS Percent: 89 %
Brady Statistic AS VS Percent: 9 %
Date Time Interrogation Session: 20190425160205
Implantable Lead Implant Date: 20080221
Implantable Lead Model: 4076
Implantable Lead Model: 5076
Lead Channel Impedance Value: 428 Ohm
Lead Channel Pacing Threshold Amplitude: 0.5 V
Lead Channel Pacing Threshold Amplitude: 0.875 V
Lead Channel Pacing Threshold Pulse Width: 0.4 ms
Lead Channel Setting Sensing Sensitivity: 2 mV
MDC IDC LEAD IMPLANT DT: 20080221
MDC IDC LEAD LOCATION: 753859
MDC IDC LEAD LOCATION: 753860
MDC IDC MSMT BATTERY REMAINING LONGEVITY: 109 mo
MDC IDC MSMT BATTERY VOLTAGE: 2.79 V
MDC IDC MSMT LEADCHNL RA IMPEDANCE VALUE: 479 Ohm
MDC IDC MSMT LEADCHNL RV PACING THRESHOLD PULSEWIDTH: 0.4 ms
MDC IDC PG IMPLANT DT: 20130710
MDC IDC SET LEADCHNL RA PACING AMPLITUDE: 2 V
MDC IDC SET LEADCHNL RV PACING AMPLITUDE: 2.5 V
MDC IDC SET LEADCHNL RV PACING PULSEWIDTH: 0.4 ms
MDC IDC STAT BRADY AS VP PERCENT: 0 %

## 2017-06-20 ENCOUNTER — Ambulatory Visit (HOSPITAL_COMMUNITY)
Admit: 2017-06-20 | Discharge: 2017-06-20 | Disposition: A | Discharge status: Home / Self Care | Payer: No Typology Code available for payment source | Attending: Nurse Practitioner | Admitting: Nurse Practitioner

## 2017-06-20 VITALS — BP 118/52 | HR 61 | Wt 128.0 lb

## 2017-06-20 DIAGNOSIS — I5032 Chronic diastolic (congestive) heart failure: Secondary | ICD-10-CM | POA: Insufficient documentation

## 2017-06-20 DIAGNOSIS — Z9841 Cataract extraction status, right eye: Secondary | ICD-10-CM | POA: Insufficient documentation

## 2017-06-20 DIAGNOSIS — I110 Hypertensive heart disease with heart failure: Secondary | ICD-10-CM | POA: Diagnosis not present

## 2017-06-20 DIAGNOSIS — I480 Paroxysmal atrial fibrillation: Secondary | ICD-10-CM

## 2017-06-20 DIAGNOSIS — Z882 Allergy status to sulfonamides status: Secondary | ICD-10-CM | POA: Insufficient documentation

## 2017-06-20 DIAGNOSIS — Z87442 Personal history of urinary calculi: Secondary | ICD-10-CM | POA: Insufficient documentation

## 2017-06-20 DIAGNOSIS — F419 Anxiety disorder, unspecified: Secondary | ICD-10-CM | POA: Insufficient documentation

## 2017-06-20 DIAGNOSIS — Z950 Presence of cardiac pacemaker: Secondary | ICD-10-CM | POA: Insufficient documentation

## 2017-06-20 DIAGNOSIS — Z9889 Other specified postprocedural states: Secondary | ICD-10-CM | POA: Insufficient documentation

## 2017-06-20 DIAGNOSIS — Z825 Family history of asthma and other chronic lower respiratory diseases: Secondary | ICD-10-CM | POA: Insufficient documentation

## 2017-06-20 DIAGNOSIS — I351 Nonrheumatic aortic (valve) insufficiency: Secondary | ICD-10-CM | POA: Insufficient documentation

## 2017-06-20 DIAGNOSIS — Z85828 Personal history of other malignant neoplasm of skin: Secondary | ICD-10-CM | POA: Insufficient documentation

## 2017-06-20 DIAGNOSIS — E119 Type 2 diabetes mellitus without complications: Secondary | ICD-10-CM | POA: Insufficient documentation

## 2017-06-20 DIAGNOSIS — Z7901 Long term (current) use of anticoagulants: Secondary | ICD-10-CM | POA: Diagnosis not present

## 2017-06-20 DIAGNOSIS — Z79899 Other long term (current) drug therapy: Secondary | ICD-10-CM | POA: Insufficient documentation

## 2017-06-20 DIAGNOSIS — F329 Major depressive disorder, single episode, unspecified: Secondary | ICD-10-CM | POA: Insufficient documentation

## 2017-06-20 DIAGNOSIS — Z9842 Cataract extraction status, left eye: Secondary | ICD-10-CM | POA: Insufficient documentation

## 2017-06-20 NOTE — Progress Notes (Signed)
Primary Care Physician: Crist Infante, MD Referring Physician: [facility] f/u Cardiologist: Dr. Martinique   Jo [name] is a 84 y.o. female with a h/o paroxysmal  Afib, chronic diastolic HF, HTN, SSS, s/p PPM, was admitted 5/12 thru 5/15 for Afib with RVR and HF. She was initial;ly controlled with cardizem and eventually transitioned to amiodarone. She was diuresed. Hypoxia at [place] was resolved with diuresis and getting back to SR. SHe is in the afib clinc for f/u.   Her last weight in the hospital is 106 lb and weight is 128 lb today, but daughter states the 106 weight is erroneous. Her normal weight at home is 125-130 lbs. She reports feeling improved. No obvious fluid today and no significant shortness of breath.  Today, she denies symptoms of palpitations, chest pain, shortness of breath, orthopnea, PND, lower extremity edema, dizziness, presyncope, syncope, or neurologic sequela. The patient is tolerating medications without difficulties and is otherwise without complaint today.   Past Medical History:  Diagnosis Date  . Anxiety   . Arthritis    "hands; between shoulders; back; feet"  . Chronic anticoagulation    -->Eliquis  . Chronic diastolic CHF (congestive heart failure) ([place])    a. 06/2011 Echo: EF 60-65%.  . Depression   . Essential hypertension   . High risk medication use    on Flecainide  . Kidney stone    passed in [place] 2014  . Moderate aortic insufficiency    a. 06/2011 Echo: EF 60-65%, mild LVH, no rwma, mod AI.  Marland Kitchen Osteoporosis   . Osteoporosis   . PAF (paroxysmal atrial fibrillation) ([facility])    a. chronic flecainide->reduced to 25 bid 08/2014.  Marland Kitchen Panic attacks    "since losing husband 1994"  . Skin cancer ?1960's   "between breasts"  . SSS (sick sinus syndrome) ([facility])    a. s/p pacemaker. b. s/p Medtronic gen change 07/2011; c.   . Type II diabetes mellitus ([place])    "controlled w/diet"   Past Surgical History:  Procedure Laterality Date  . APPENDECTOMY  1955  .  BREAST CYST EXCISION  ~ 1950   right  . BREAST LUMPECTOMY WITH NEEDLE LOCALIZATION Bilateral 02/03/2014   Procedure: BILATERAL BREAST LUMPECTOMY WITH NEEDLE LOCALIZATION ON LEFT;  Surgeon: Excell Seltzer, MD;  Location: [facility];  Service: General;  Laterality: Bilateral;  . CARDIOVASCULAR STRESS TEST  12/09/2004  . CATARACT EXTRACTION W/ INTRAOCULAR LENS  IMPLANT, BILATERAL  1990's  . [place]  . DILATION AND CURETTAGE OF UTERUS  1953  . INSERT / REPLACE / REMOVE PACEMAKER  03/16/2006  . PERMANENT PACEMAKER GENERATOR CHANGE  08/03/2011   Procedure: PERMANENT PACEMAKER GENERATOR CHANGE;  Surgeon: Thompson Grayer, MD;  Location: [facility] CATH LAB;  Service: Cardiovascular;;  . SKIN CANCER EXCISION  ?1960's   "between my breasts"  . TONSILLECTOMY     "school age"    Current Outpatient Medications  Medication Sig Dispense Refill  . acetaminophen (TYLENOL) 500 MG tablet Take 500-1,000 mg by mouth every 6 (six) hours as needed for mild pain, moderate pain, fever or headache.    Marland Kitchen amiodarone (PACERONE) 200 MG tablet Take 1 tablet (200 mg total) by mouth daily.    Marland Kitchen apixaban (ELIQUIS) 2.5 MG TABS tablet Take 1 tablet (2.5 mg total) by mouth 2 (two) times daily. 60 tablet 11  . azelastine (ASTELIN) 0.1 % nasal spray Place 1 spray into both nostrils daily as needed for rhinitis. Use in each nostril as directed    .  CRESTOR 5 MG tablet Take 2.5 mg by mouth every Monday, Wednesday, and Friday.   7  . diltiazem (CARDIZEM CD) 240 MG 24 hr capsule Take 240 mg by mouth daily.     Marland Kitchen estradiol (ESTRACE) 0.1 MG/GM vaginal cream Place 1 Applicatorful vaginally every Monday, Wednesday, and Friday.    . feeding supplement, ENSURE ENLIVE, (ENSURE ENLIVE) LIQD Take 237 mLs by mouth 2 (two) times daily between meals. 237 mL 12  . Flaxseed, Linseed, (FLAX SEEDS PO) Take 2.5 mLs by mouth daily with breakfast.    . linagliptin (TRADJENTA) 5 MG TABS tablet Take 1 tablet (5 mg total) by mouth daily. (Patient not  taking: Reported on 06/04/2017) 30 tablet   . LORazepam (ATIVAN) 0.5 MG tablet Take 0.5-1 tablets (0.25-0.5 mg total) by mouth See admin instructions. Take 0.25 mg tablet in the morning and afternoon, then 0.5 mg at bedtime 12 tablet 0  . polyethylene glycol (MIRALAX / GLYCOLAX) packet Take 17 g by mouth daily as needed for mild constipation.     . sertraline (ZOLOFT) 50 MG tablet Take 50 mg by mouth daily.  3  . Vitamin D, Ergocalciferol, (DRISDOL) 50000 UNITS CAPS capsule Take 50,000 Units by mouth every Wednesday.     No current facility-administered medications for this encounter.     Allergies  Allergen Reactions  . Sulfonamide Derivatives Rash    "it was all over my legs"  . Citalopram Hydrobromide Other (See Comments)    Pt does not remember reaction    Social History   Socioeconomic History  . Marital status: Widowed    Spouse name: Not on file  . Number of children: 2  . Years of education: Not on file  . Highest education level: Not on file  Occupational History  . Not on file  Social Needs  . Financial resource strain: Not on file  . Food insecurity:    Worry: Not on file    Inability: Not on file  . Transportation needs:    Medical: Not on file    Non-medical: Not on file  Tobacco Use  . Smoking status: Never Smoker  . Smokeless tobacco: Never Used  Substance and Sexual Activity  . Alcohol use: No  . Drug use: No  . Sexual activity: Never  Lifestyle  . Physical activity:    Days per week: Not on file    Minutes per session: Not on file  . Stress: Not on file  Relationships  . Social connections:    Talks on phone: Not on file    Gets together: Not on file    Attends religious service: Not on file    Active member of club or organization: Not on file    Attends meetings of clubs or organizations: Not on file    Relationship status: Not on file  . Intimate partner violence:    Fear of current or ex partner: Not on file    Emotionally abused: Not on  file    Physically abused: Not on file    Forced sexual activity: Not on file  Other Topics Concern  . Not on file  Social History Narrative   Lives alone    Family History  Problem Relation Age of Onset  . Emphysema Father     ROS- All systems are reviewed and negative except as per the HPI above  Physical Exam: Vitals:   06/20/17 1358  BP: (!) 118/52  Pulse: 61  Weight: 128 lb (58.1 kg)  Wt Readings from Last 3 Encounters:  06/20/17 128 lb (58.1 kg)  06/07/17 106 lb 0.7 oz (48.1 kg)  05/24/17 133 lb (60.3 kg)  Family states that 106 lb weight is erroneous, normal weight at home is 125-130 lbs  Labs: Lab Results  Component Value Date   NA 141 06/06/2017   K 4.2 06/06/2017   CL 103 06/06/2017   CO2 28 06/06/2017   GLUCOSE 175 (H) 06/06/2017   BUN 19 06/06/2017   CREATININE 0.89 06/06/2017   CALCIUM 8.9 06/06/2017   PHOS 3.1 07/09/2012   MG 2.0 06/05/2017   Lab Results  Component Value Date   INR 1.41 07/06/2012   No results found for: CHOL, HDL, LDLCALC, TRIG   GEN- The patient is well appearing, alert and oriented x 3 today.   Head- normocephalic, atraumatic Eyes-  Sclera clear, conjunctiva pink Ears- hearing intact Oropharynx- clear Neck- supple, no JVP Lymph- no cervical lymphadenopathy Lungs- Clear to ausculation bilaterally, normal work of breathing Heart- Regular rate and rhythm, no murmurs, rubs or gallops, PMI not laterally displaced GI- soft, NT, ND, + BS Extremities- no clubbing, cyanosis, or edema MS- no significant deformity or atrophy Skin- no rash or lesion Psych- euthymic mood, full affect Neuro- strength and sensation are intact  EKG-atrial paced rhythm at 61 bpm, Pr int 170 bpm, qrs int 92 ms, qtc 440 ms Hospital records reviewed    Assessment and Plan: 1. Afib with RVR Now appears to be in SR  Continue amiodarone 200 mg daily Continue diltiazem 240 mg daily Continue eliquis 2.5 mg bid for chadsvasc score of at least  6  2. Chronic diastolic HF Stable  Continue monitoring weight at home  Low salt  3. HTN  Stable   F/u with PCP as scheduled in the next few weeks F/u in afib clinic in 2 months  [place]. Andreya Lacks, [facility] [address] [place], [place] [address] [phone]

## 2017-07-08 DIAGNOSIS — F3289 Other specified depressive episodes: Secondary | ICD-10-CM | POA: Diagnosis not present

## 2017-07-08 DIAGNOSIS — E119 Type 2 diabetes mellitus without complications: Secondary | ICD-10-CM | POA: Diagnosis not present

## 2017-07-08 DIAGNOSIS — F329 Major depressive disorder, single episode, unspecified: Secondary | ICD-10-CM | POA: Diagnosis not present

## 2017-07-08 DIAGNOSIS — F419 Anxiety disorder, unspecified: Secondary | ICD-10-CM | POA: Diagnosis not present

## 2017-07-08 DIAGNOSIS — E785 Hyperlipidemia, unspecified: Secondary | ICD-10-CM | POA: Diagnosis not present

## 2017-07-08 DIAGNOSIS — F418 Other specified anxiety disorders: Secondary | ICD-10-CM | POA: Diagnosis not present

## 2017-07-08 DIAGNOSIS — I5033 Acute on chronic diastolic (congestive) heart failure: Secondary | ICD-10-CM | POA: Diagnosis not present

## 2017-07-08 DIAGNOSIS — I4891 Unspecified atrial fibrillation: Secondary | ICD-10-CM | POA: Diagnosis not present

## 2017-07-08 DIAGNOSIS — E7849 Other hyperlipidemia: Secondary | ICD-10-CM | POA: Diagnosis not present

## 2017-07-08 DIAGNOSIS — Z791 Long term (current) use of non-steroidal anti-inflammatories (NSAID): Secondary | ICD-10-CM | POA: Diagnosis not present

## 2017-07-08 DIAGNOSIS — Z7901 Long term (current) use of anticoagulants: Secondary | ICD-10-CM | POA: Diagnosis not present

## 2017-07-08 DIAGNOSIS — Z950 Presence of cardiac pacemaker: Secondary | ICD-10-CM | POA: Diagnosis not present

## 2017-07-10 DIAGNOSIS — E119 Type 2 diabetes mellitus without complications: Secondary | ICD-10-CM | POA: Diagnosis not present

## 2017-07-10 DIAGNOSIS — E785 Hyperlipidemia, unspecified: Secondary | ICD-10-CM | POA: Diagnosis not present

## 2017-07-10 DIAGNOSIS — F329 Major depressive disorder, single episode, unspecified: Secondary | ICD-10-CM | POA: Diagnosis not present

## 2017-07-10 DIAGNOSIS — I5033 Acute on chronic diastolic (congestive) heart failure: Secondary | ICD-10-CM | POA: Diagnosis not present

## 2017-07-10 DIAGNOSIS — F419 Anxiety disorder, unspecified: Secondary | ICD-10-CM | POA: Diagnosis not present

## 2017-07-10 DIAGNOSIS — I4891 Unspecified atrial fibrillation: Secondary | ICD-10-CM | POA: Diagnosis not present

## 2017-07-11 DIAGNOSIS — F329 Major depressive disorder, single episode, unspecified: Secondary | ICD-10-CM | POA: Diagnosis not present

## 2017-07-11 DIAGNOSIS — I5033 Acute on chronic diastolic (congestive) heart failure: Secondary | ICD-10-CM | POA: Diagnosis not present

## 2017-07-11 DIAGNOSIS — E785 Hyperlipidemia, unspecified: Secondary | ICD-10-CM | POA: Diagnosis not present

## 2017-07-11 DIAGNOSIS — I4891 Unspecified atrial fibrillation: Secondary | ICD-10-CM | POA: Diagnosis not present

## 2017-07-11 DIAGNOSIS — F419 Anxiety disorder, unspecified: Secondary | ICD-10-CM | POA: Diagnosis not present

## 2017-07-11 DIAGNOSIS — E119 Type 2 diabetes mellitus without complications: Secondary | ICD-10-CM | POA: Diagnosis not present

## 2017-07-13 DIAGNOSIS — F329 Major depressive disorder, single episode, unspecified: Secondary | ICD-10-CM | POA: Diagnosis not present

## 2017-07-13 DIAGNOSIS — E119 Type 2 diabetes mellitus without complications: Secondary | ICD-10-CM | POA: Diagnosis not present

## 2017-07-13 DIAGNOSIS — I4891 Unspecified atrial fibrillation: Secondary | ICD-10-CM | POA: Diagnosis not present

## 2017-07-13 DIAGNOSIS — E785 Hyperlipidemia, unspecified: Secondary | ICD-10-CM | POA: Diagnosis not present

## 2017-07-13 DIAGNOSIS — I5033 Acute on chronic diastolic (congestive) heart failure: Secondary | ICD-10-CM | POA: Diagnosis not present

## 2017-07-13 DIAGNOSIS — F419 Anxiety disorder, unspecified: Secondary | ICD-10-CM | POA: Diagnosis not present

## 2017-07-14 DIAGNOSIS — I4891 Unspecified atrial fibrillation: Secondary | ICD-10-CM | POA: Diagnosis not present

## 2017-07-14 DIAGNOSIS — F419 Anxiety disorder, unspecified: Secondary | ICD-10-CM | POA: Diagnosis not present

## 2017-07-14 DIAGNOSIS — F329 Major depressive disorder, single episode, unspecified: Secondary | ICD-10-CM | POA: Diagnosis not present

## 2017-07-14 DIAGNOSIS — E785 Hyperlipidemia, unspecified: Secondary | ICD-10-CM | POA: Diagnosis not present

## 2017-07-14 DIAGNOSIS — E119 Type 2 diabetes mellitus without complications: Secondary | ICD-10-CM | POA: Diagnosis not present

## 2017-07-14 DIAGNOSIS — I5033 Acute on chronic diastolic (congestive) heart failure: Secondary | ICD-10-CM | POA: Diagnosis not present

## 2017-07-17 DIAGNOSIS — E785 Hyperlipidemia, unspecified: Secondary | ICD-10-CM | POA: Diagnosis not present

## 2017-07-17 DIAGNOSIS — E119 Type 2 diabetes mellitus without complications: Secondary | ICD-10-CM | POA: Diagnosis not present

## 2017-07-17 DIAGNOSIS — F329 Major depressive disorder, single episode, unspecified: Secondary | ICD-10-CM | POA: Diagnosis not present

## 2017-07-17 DIAGNOSIS — F419 Anxiety disorder, unspecified: Secondary | ICD-10-CM | POA: Diagnosis not present

## 2017-07-17 DIAGNOSIS — I5033 Acute on chronic diastolic (congestive) heart failure: Secondary | ICD-10-CM | POA: Diagnosis not present

## 2017-07-17 DIAGNOSIS — I4891 Unspecified atrial fibrillation: Secondary | ICD-10-CM | POA: Diagnosis not present

## 2017-07-18 DIAGNOSIS — I5033 Acute on chronic diastolic (congestive) heart failure: Secondary | ICD-10-CM | POA: Diagnosis not present

## 2017-07-18 DIAGNOSIS — F329 Major depressive disorder, single episode, unspecified: Secondary | ICD-10-CM | POA: Diagnosis not present

## 2017-07-18 DIAGNOSIS — E785 Hyperlipidemia, unspecified: Secondary | ICD-10-CM | POA: Diagnosis not present

## 2017-07-18 DIAGNOSIS — E119 Type 2 diabetes mellitus without complications: Secondary | ICD-10-CM | POA: Diagnosis not present

## 2017-07-18 DIAGNOSIS — F419 Anxiety disorder, unspecified: Secondary | ICD-10-CM | POA: Diagnosis not present

## 2017-07-18 DIAGNOSIS — I4891 Unspecified atrial fibrillation: Secondary | ICD-10-CM | POA: Diagnosis not present

## 2017-07-19 DIAGNOSIS — J9601 Acute respiratory failure with hypoxia: Secondary | ICD-10-CM | POA: Diagnosis not present

## 2017-07-19 DIAGNOSIS — F418 Other specified anxiety disorders: Secondary | ICD-10-CM | POA: Diagnosis not present

## 2017-07-19 DIAGNOSIS — R0609 Other forms of dyspnea: Secondary | ICD-10-CM | POA: Diagnosis not present

## 2017-07-19 DIAGNOSIS — Z6823 Body mass index (BMI) 23.0-23.9, adult: Secondary | ICD-10-CM | POA: Diagnosis not present

## 2017-07-19 DIAGNOSIS — E7849 Other hyperlipidemia: Secondary | ICD-10-CM | POA: Diagnosis not present

## 2017-07-19 DIAGNOSIS — I5033 Acute on chronic diastolic (congestive) heart failure: Secondary | ICD-10-CM | POA: Diagnosis not present

## 2017-07-19 DIAGNOSIS — I4891 Unspecified atrial fibrillation: Secondary | ICD-10-CM | POA: Diagnosis not present

## 2017-07-19 DIAGNOSIS — E119 Type 2 diabetes mellitus without complications: Secondary | ICD-10-CM | POA: Diagnosis not present

## 2017-07-20 DIAGNOSIS — E119 Type 2 diabetes mellitus without complications: Secondary | ICD-10-CM | POA: Diagnosis not present

## 2017-07-20 DIAGNOSIS — F329 Major depressive disorder, single episode, unspecified: Secondary | ICD-10-CM | POA: Diagnosis not present

## 2017-07-20 DIAGNOSIS — F419 Anxiety disorder, unspecified: Secondary | ICD-10-CM | POA: Diagnosis not present

## 2017-07-20 DIAGNOSIS — E785 Hyperlipidemia, unspecified: Secondary | ICD-10-CM | POA: Diagnosis not present

## 2017-07-20 DIAGNOSIS — I4891 Unspecified atrial fibrillation: Secondary | ICD-10-CM | POA: Diagnosis not present

## 2017-07-20 DIAGNOSIS — I5033 Acute on chronic diastolic (congestive) heart failure: Secondary | ICD-10-CM | POA: Diagnosis not present

## 2017-07-25 DIAGNOSIS — I4891 Unspecified atrial fibrillation: Secondary | ICD-10-CM | POA: Diagnosis not present

## 2017-07-25 DIAGNOSIS — I5033 Acute on chronic diastolic (congestive) heart failure: Secondary | ICD-10-CM | POA: Diagnosis not present

## 2017-07-25 DIAGNOSIS — F419 Anxiety disorder, unspecified: Secondary | ICD-10-CM | POA: Diagnosis not present

## 2017-07-25 DIAGNOSIS — E785 Hyperlipidemia, unspecified: Secondary | ICD-10-CM | POA: Diagnosis not present

## 2017-07-25 DIAGNOSIS — F329 Major depressive disorder, single episode, unspecified: Secondary | ICD-10-CM | POA: Diagnosis not present

## 2017-07-25 DIAGNOSIS — E119 Type 2 diabetes mellitus without complications: Secondary | ICD-10-CM | POA: Diagnosis not present

## 2017-07-26 DIAGNOSIS — E119 Type 2 diabetes mellitus without complications: Secondary | ICD-10-CM | POA: Diagnosis not present

## 2017-07-26 DIAGNOSIS — I4891 Unspecified atrial fibrillation: Secondary | ICD-10-CM | POA: Diagnosis not present

## 2017-07-26 DIAGNOSIS — E785 Hyperlipidemia, unspecified: Secondary | ICD-10-CM | POA: Diagnosis not present

## 2017-07-26 DIAGNOSIS — I5033 Acute on chronic diastolic (congestive) heart failure: Secondary | ICD-10-CM | POA: Diagnosis not present

## 2017-07-26 DIAGNOSIS — F419 Anxiety disorder, unspecified: Secondary | ICD-10-CM | POA: Diagnosis not present

## 2017-07-26 DIAGNOSIS — F329 Major depressive disorder, single episode, unspecified: Secondary | ICD-10-CM | POA: Diagnosis not present

## 2017-07-27 DIAGNOSIS — E119 Type 2 diabetes mellitus without complications: Secondary | ICD-10-CM | POA: Diagnosis not present

## 2017-07-27 DIAGNOSIS — I5033 Acute on chronic diastolic (congestive) heart failure: Secondary | ICD-10-CM | POA: Diagnosis not present

## 2017-07-27 DIAGNOSIS — F419 Anxiety disorder, unspecified: Secondary | ICD-10-CM | POA: Diagnosis not present

## 2017-07-27 DIAGNOSIS — I4891 Unspecified atrial fibrillation: Secondary | ICD-10-CM | POA: Diagnosis not present

## 2017-07-27 DIAGNOSIS — F329 Major depressive disorder, single episode, unspecified: Secondary | ICD-10-CM | POA: Diagnosis not present

## 2017-07-27 DIAGNOSIS — E785 Hyperlipidemia, unspecified: Secondary | ICD-10-CM | POA: Diagnosis not present

## 2017-07-31 DIAGNOSIS — F329 Major depressive disorder, single episode, unspecified: Secondary | ICD-10-CM | POA: Diagnosis not present

## 2017-07-31 DIAGNOSIS — I5033 Acute on chronic diastolic (congestive) heart failure: Secondary | ICD-10-CM | POA: Diagnosis not present

## 2017-07-31 DIAGNOSIS — E785 Hyperlipidemia, unspecified: Secondary | ICD-10-CM | POA: Diagnosis not present

## 2017-07-31 DIAGNOSIS — E119 Type 2 diabetes mellitus without complications: Secondary | ICD-10-CM | POA: Diagnosis not present

## 2017-07-31 DIAGNOSIS — I4891 Unspecified atrial fibrillation: Secondary | ICD-10-CM | POA: Diagnosis not present

## 2017-07-31 DIAGNOSIS — F419 Anxiety disorder, unspecified: Secondary | ICD-10-CM | POA: Diagnosis not present

## 2017-08-01 ENCOUNTER — Other Ambulatory Visit (HOSPITAL_COMMUNITY): Payer: Self-pay | Admitting: *Deleted

## 2017-08-01 MED ORDER — AMIODARONE HCL 200 MG PO TABS: 200.0000 mg | ORAL_TABLET | Freq: Every day | ORAL | 6 refills | Status: DC

## 2017-08-02 DIAGNOSIS — E119 Type 2 diabetes mellitus without complications: Secondary | ICD-10-CM | POA: Diagnosis not present

## 2017-08-02 DIAGNOSIS — F329 Major depressive disorder, single episode, unspecified: Secondary | ICD-10-CM | POA: Diagnosis not present

## 2017-08-02 DIAGNOSIS — I5033 Acute on chronic diastolic (congestive) heart failure: Secondary | ICD-10-CM | POA: Diagnosis not present

## 2017-08-02 DIAGNOSIS — I4891 Unspecified atrial fibrillation: Secondary | ICD-10-CM | POA: Diagnosis not present

## 2017-08-02 DIAGNOSIS — E785 Hyperlipidemia, unspecified: Secondary | ICD-10-CM | POA: Diagnosis not present

## 2017-08-02 DIAGNOSIS — F419 Anxiety disorder, unspecified: Secondary | ICD-10-CM | POA: Diagnosis not present

## 2017-08-03 DIAGNOSIS — F419 Anxiety disorder, unspecified: Secondary | ICD-10-CM | POA: Diagnosis not present

## 2017-08-03 DIAGNOSIS — E785 Hyperlipidemia, unspecified: Secondary | ICD-10-CM | POA: Diagnosis not present

## 2017-08-03 DIAGNOSIS — E119 Type 2 diabetes mellitus without complications: Secondary | ICD-10-CM | POA: Diagnosis not present

## 2017-08-03 DIAGNOSIS — I4891 Unspecified atrial fibrillation: Secondary | ICD-10-CM | POA: Diagnosis not present

## 2017-08-03 DIAGNOSIS — F329 Major depressive disorder, single episode, unspecified: Secondary | ICD-10-CM | POA: Diagnosis not present

## 2017-08-03 DIAGNOSIS — I5033 Acute on chronic diastolic (congestive) heart failure: Secondary | ICD-10-CM | POA: Diagnosis not present

## 2017-08-09 DIAGNOSIS — E119 Type 2 diabetes mellitus without complications: Secondary | ICD-10-CM | POA: Diagnosis not present

## 2017-08-09 DIAGNOSIS — F329 Major depressive disorder, single episode, unspecified: Secondary | ICD-10-CM | POA: Diagnosis not present

## 2017-08-09 DIAGNOSIS — E785 Hyperlipidemia, unspecified: Secondary | ICD-10-CM | POA: Diagnosis not present

## 2017-08-09 DIAGNOSIS — F419 Anxiety disorder, unspecified: Secondary | ICD-10-CM | POA: Diagnosis not present

## 2017-08-09 DIAGNOSIS — I4891 Unspecified atrial fibrillation: Secondary | ICD-10-CM | POA: Diagnosis not present

## 2017-08-09 DIAGNOSIS — I5033 Acute on chronic diastolic (congestive) heart failure: Secondary | ICD-10-CM | POA: Diagnosis not present

## 2017-08-10 DIAGNOSIS — F419 Anxiety disorder, unspecified: Secondary | ICD-10-CM | POA: Diagnosis not present

## 2017-08-10 DIAGNOSIS — I4891 Unspecified atrial fibrillation: Secondary | ICD-10-CM | POA: Diagnosis not present

## 2017-08-10 DIAGNOSIS — F329 Major depressive disorder, single episode, unspecified: Secondary | ICD-10-CM | POA: Diagnosis not present

## 2017-08-10 DIAGNOSIS — I5033 Acute on chronic diastolic (congestive) heart failure: Secondary | ICD-10-CM | POA: Diagnosis not present

## 2017-08-10 DIAGNOSIS — E785 Hyperlipidemia, unspecified: Secondary | ICD-10-CM | POA: Diagnosis not present

## 2017-08-10 DIAGNOSIS — E119 Type 2 diabetes mellitus without complications: Secondary | ICD-10-CM | POA: Diagnosis not present

## 2017-08-11 DIAGNOSIS — I5033 Acute on chronic diastolic (congestive) heart failure: Secondary | ICD-10-CM | POA: Diagnosis not present

## 2017-08-11 DIAGNOSIS — F419 Anxiety disorder, unspecified: Secondary | ICD-10-CM | POA: Diagnosis not present

## 2017-08-11 DIAGNOSIS — F329 Major depressive disorder, single episode, unspecified: Secondary | ICD-10-CM | POA: Diagnosis not present

## 2017-08-11 DIAGNOSIS — E119 Type 2 diabetes mellitus without complications: Secondary | ICD-10-CM | POA: Diagnosis not present

## 2017-08-11 DIAGNOSIS — I4891 Unspecified atrial fibrillation: Secondary | ICD-10-CM | POA: Diagnosis not present

## 2017-08-11 DIAGNOSIS — E785 Hyperlipidemia, unspecified: Secondary | ICD-10-CM | POA: Diagnosis not present

## 2017-08-14 DIAGNOSIS — I10 Essential (primary) hypertension: Secondary | ICD-10-CM | POA: Diagnosis not present

## 2017-08-14 DIAGNOSIS — E785 Hyperlipidemia, unspecified: Secondary | ICD-10-CM | POA: Diagnosis not present

## 2017-08-14 DIAGNOSIS — F419 Anxiety disorder, unspecified: Secondary | ICD-10-CM | POA: Diagnosis not present

## 2017-08-14 DIAGNOSIS — E119 Type 2 diabetes mellitus without complications: Secondary | ICD-10-CM | POA: Diagnosis not present

## 2017-08-14 DIAGNOSIS — F329 Major depressive disorder, single episode, unspecified: Secondary | ICD-10-CM | POA: Diagnosis not present

## 2017-08-14 DIAGNOSIS — E7849 Other hyperlipidemia: Secondary | ICD-10-CM | POA: Diagnosis not present

## 2017-08-14 DIAGNOSIS — R82998 Other abnormal findings in urine: Secondary | ICD-10-CM | POA: Diagnosis not present

## 2017-08-14 DIAGNOSIS — I5033 Acute on chronic diastolic (congestive) heart failure: Secondary | ICD-10-CM | POA: Diagnosis not present

## 2017-08-14 DIAGNOSIS — E559 Vitamin D deficiency, unspecified: Secondary | ICD-10-CM | POA: Diagnosis not present

## 2017-08-14 DIAGNOSIS — R946 Abnormal results of thyroid function studies: Secondary | ICD-10-CM | POA: Diagnosis not present

## 2017-08-14 DIAGNOSIS — I4891 Unspecified atrial fibrillation: Secondary | ICD-10-CM | POA: Diagnosis not present

## 2017-08-15 DIAGNOSIS — E785 Hyperlipidemia, unspecified: Secondary | ICD-10-CM | POA: Diagnosis not present

## 2017-08-15 DIAGNOSIS — F329 Major depressive disorder, single episode, unspecified: Secondary | ICD-10-CM | POA: Diagnosis not present

## 2017-08-15 DIAGNOSIS — I4891 Unspecified atrial fibrillation: Secondary | ICD-10-CM | POA: Diagnosis not present

## 2017-08-15 DIAGNOSIS — F419 Anxiety disorder, unspecified: Secondary | ICD-10-CM | POA: Diagnosis not present

## 2017-08-15 DIAGNOSIS — E119 Type 2 diabetes mellitus without complications: Secondary | ICD-10-CM | POA: Diagnosis not present

## 2017-08-15 DIAGNOSIS — I5033 Acute on chronic diastolic (congestive) heart failure: Secondary | ICD-10-CM | POA: Diagnosis not present

## 2017-08-16 DIAGNOSIS — I4891 Unspecified atrial fibrillation: Secondary | ICD-10-CM | POA: Diagnosis not present

## 2017-08-16 DIAGNOSIS — E785 Hyperlipidemia, unspecified: Secondary | ICD-10-CM | POA: Diagnosis not present

## 2017-08-16 DIAGNOSIS — F329 Major depressive disorder, single episode, unspecified: Secondary | ICD-10-CM | POA: Diagnosis not present

## 2017-08-16 DIAGNOSIS — E119 Type 2 diabetes mellitus without complications: Secondary | ICD-10-CM | POA: Diagnosis not present

## 2017-08-16 DIAGNOSIS — F419 Anxiety disorder, unspecified: Secondary | ICD-10-CM | POA: Diagnosis not present

## 2017-08-16 DIAGNOSIS — I5033 Acute on chronic diastolic (congestive) heart failure: Secondary | ICD-10-CM | POA: Diagnosis not present

## 2017-08-17 ENCOUNTER — Ambulatory Visit (INDEPENDENT_AMBULATORY_CARE_PROVIDER_SITE_OTHER): Payer: Medicare Other | Admitting: *Deleted

## 2017-08-17 DIAGNOSIS — I495 Sick sinus syndrome: Secondary | ICD-10-CM

## 2017-08-17 NOTE — Progress Notes (Signed)
Remote pacemaker transmission.   

## 2017-08-18 DIAGNOSIS — I4891 Unspecified atrial fibrillation: Secondary | ICD-10-CM | POA: Diagnosis not present

## 2017-08-18 DIAGNOSIS — E785 Hyperlipidemia, unspecified: Secondary | ICD-10-CM | POA: Diagnosis not present

## 2017-08-18 DIAGNOSIS — F419 Anxiety disorder, unspecified: Secondary | ICD-10-CM | POA: Diagnosis not present

## 2017-08-18 DIAGNOSIS — F329 Major depressive disorder, single episode, unspecified: Secondary | ICD-10-CM | POA: Diagnosis not present

## 2017-08-18 DIAGNOSIS — I5033 Acute on chronic diastolic (congestive) heart failure: Secondary | ICD-10-CM | POA: Diagnosis not present

## 2017-08-18 DIAGNOSIS — E119 Type 2 diabetes mellitus without complications: Secondary | ICD-10-CM | POA: Diagnosis not present

## 2017-08-21 DIAGNOSIS — I5033 Acute on chronic diastolic (congestive) heart failure: Secondary | ICD-10-CM | POA: Diagnosis not present

## 2017-08-21 DIAGNOSIS — E7849 Other hyperlipidemia: Secondary | ICD-10-CM | POA: Diagnosis not present

## 2017-08-21 DIAGNOSIS — C50919 Malignant neoplasm of unspecified site of unspecified female breast: Secondary | ICD-10-CM | POA: Diagnosis not present

## 2017-08-21 DIAGNOSIS — E1169 Type 2 diabetes mellitus with other specified complication: Secondary | ICD-10-CM | POA: Diagnosis not present

## 2017-08-21 DIAGNOSIS — H9193 Unspecified hearing loss, bilateral: Secondary | ICD-10-CM | POA: Diagnosis not present

## 2017-08-21 DIAGNOSIS — I4891 Unspecified atrial fibrillation: Secondary | ICD-10-CM | POA: Diagnosis not present

## 2017-08-21 DIAGNOSIS — Z6824 Body mass index (BMI) 24.0-24.9, adult: Secondary | ICD-10-CM | POA: Diagnosis not present

## 2017-08-21 DIAGNOSIS — J9601 Acute respiratory failure with hypoxia: Secondary | ICD-10-CM | POA: Diagnosis not present

## 2017-08-21 DIAGNOSIS — Z1389 Encounter for screening for other disorder: Secondary | ICD-10-CM | POA: Diagnosis not present

## 2017-08-21 DIAGNOSIS — Z0000 Encounter for general adult medical examination without abnormal findings: Secondary | ICD-10-CM | POA: Diagnosis not present

## 2017-08-21 DIAGNOSIS — R0609 Other forms of dyspnea: Secondary | ICD-10-CM | POA: Diagnosis not present

## 2017-08-21 DIAGNOSIS — Z8674 Personal history of sudden cardiac arrest: Secondary | ICD-10-CM | POA: Diagnosis not present

## 2017-08-22 ENCOUNTER — Ambulatory Visit (HOSPITAL_COMMUNITY)
Admission: RE | Admit: 2017-08-22 | Discharge: 2017-08-22 | Disposition: A | Discharge status: Home / Self Care | Payer: Medicare Other | Source: Ambulatory Visit | Attending: Nurse Practitioner | Admitting: Nurse Practitioner

## 2017-08-22 ENCOUNTER — Encounter (HOSPITAL_COMMUNITY): Payer: Self-pay | Admitting: Nurse Practitioner

## 2017-08-22 VITALS — BP 136/64 | HR 77 | Ht 62.0 in | Wt 132.0 lb

## 2017-08-22 DIAGNOSIS — Z79899 Other long term (current) drug therapy: Secondary | ICD-10-CM | POA: Insufficient documentation

## 2017-08-22 DIAGNOSIS — F419 Anxiety disorder, unspecified: Secondary | ICD-10-CM | POA: Insufficient documentation

## 2017-08-22 DIAGNOSIS — Z85828 Personal history of other malignant neoplasm of skin: Secondary | ICD-10-CM | POA: Diagnosis not present

## 2017-08-22 DIAGNOSIS — Z87442 Personal history of urinary calculi: Secondary | ICD-10-CM | POA: Insufficient documentation

## 2017-08-22 DIAGNOSIS — M810 Age-related osteoporosis without current pathological fracture: Secondary | ICD-10-CM | POA: Insufficient documentation

## 2017-08-22 DIAGNOSIS — I4891 Unspecified atrial fibrillation: Secondary | ICD-10-CM | POA: Diagnosis present

## 2017-08-22 DIAGNOSIS — I110 Hypertensive heart disease with heart failure: Secondary | ICD-10-CM | POA: Diagnosis not present

## 2017-08-22 DIAGNOSIS — E119 Type 2 diabetes mellitus without complications: Secondary | ICD-10-CM | POA: Diagnosis not present

## 2017-08-22 DIAGNOSIS — I4589 Other specified conduction disorders: Secondary | ICD-10-CM | POA: Insufficient documentation

## 2017-08-22 DIAGNOSIS — I5032 Chronic diastolic (congestive) heart failure: Secondary | ICD-10-CM | POA: Insufficient documentation

## 2017-08-22 DIAGNOSIS — Z950 Presence of cardiac pacemaker: Secondary | ICD-10-CM | POA: Insufficient documentation

## 2017-08-22 DIAGNOSIS — I495 Sick sinus syndrome: Secondary | ICD-10-CM | POA: Insufficient documentation

## 2017-08-22 DIAGNOSIS — Z7901 Long term (current) use of anticoagulants: Secondary | ICD-10-CM | POA: Diagnosis not present

## 2017-08-22 DIAGNOSIS — F329 Major depressive disorder, single episode, unspecified: Secondary | ICD-10-CM | POA: Insufficient documentation

## 2017-08-22 DIAGNOSIS — I351 Nonrheumatic aortic (valve) insufficiency: Secondary | ICD-10-CM | POA: Insufficient documentation

## 2017-08-22 DIAGNOSIS — I480 Paroxysmal atrial fibrillation: Secondary | ICD-10-CM | POA: Insufficient documentation

## 2017-08-22 DIAGNOSIS — Z882 Allergy status to sulfonamides status: Secondary | ICD-10-CM | POA: Diagnosis not present

## 2017-08-22 NOTE — Progress Notes (Signed)
Primary Care Physician: Crist Infante, MD Referring Physician: [facility] f/u Cardiologist: Dr. Martinique   Jo [name] is a 84 y.o. female with a h/o paroxysmal  Afib, chronic diastolic HF, HTN, SSS, s/p PPM, was admitted 5/12 thru 5/15 for Afib with RVR and HF. She was initially  controlled with cardizem and eventually transitioned to amiodarone. She was diuresed. Hypoxia at [place] was resolved with diuresis and getting back to SR. SHe is in the afib clinic for f/u.   Her last weight in the hospital is 106 lb and weight is 128 lb today, but daughter states the 106 weight is erroneous. Her normal weight at home is 125-130 lbs. She reports feeling improved. No obvious fluid today and no significant shortness of breath.  F/u 7/30, she feels well. She is A paced today. She saw her PCP yesterday and was told labs, exam was good. She has no complaints today.  Today, she denies symptoms of palpitations, chest pain, shortness of breath, orthopnea, PND, lower extremity edema, dizziness, presyncope, syncope, or neurologic sequela. The patient is tolerating medications without difficulties and is otherwise without complaint today.   Past Medical History:  Diagnosis Date  . Anxiety   . Arthritis    "hands; between shoulders; back; feet"  . Chronic anticoagulation    -->Eliquis  . Chronic diastolic CHF (congestive heart failure) ([place])    a. 06/2011 Echo: EF 60-65%.  . Depression   . Essential hypertension   . High risk medication use    on Flecainide  . Kidney stone    passed in [place] 2014  . Moderate aortic insufficiency    a. 06/2011 Echo: EF 60-65%, mild LVH, no rwma, mod AI.  Marland Kitchen Osteoporosis   . Osteoporosis   . PAF (paroxysmal atrial fibrillation) (H[facility])    a. chronic flecainide->reduced to 25 bid 08/2014.  Marland Kitchen Panic attacks    "since losing husband 1994"  . Skin cancer ?1960's   "between breasts"  . SSS (sick sinus syndrome) (H[facility])    a. s/p pacemaker. b. s/p Medtronic gen change 07/2011; c.   .  Type II diabetes mellitus ([place])    "controlled w/diet"   Past Surgical History:  Procedure Laterality Date  . APPENDECTOMY  1955  . BREAST CYST EXCISION  ~ 1950   right  . BREAST LUMPECTOMY WITH NEEDLE LOCALIZATION Bilateral 02/03/2014   Procedure: BILATERAL BREAST LUMPECTOMY WITH NEEDLE LOCALIZATION ON LEFT;  Surgeon: Excell Seltzer, MD;  Location: [facility];  Service: General;  Laterality: Bilateral;  . CARDIOVASCULAR STRESS TEST  12/09/2004  . CATARACT EXTRACTION W/ INTRAOCULAR LENS  IMPLANT, BILATERAL  1990's  . [place]  . DILATION AND CURETTAGE OF UTERUS  1953  . INSERT / REPLACE / REMOVE PACEMAKER  03/16/2006  . PERMANENT PACEMAKER GENERATOR CHANGE  08/03/2011   Procedure: PERMANENT PACEMAKER GENERATOR CHANGE;  Surgeon: Thompson Grayer, MD;  Location[facility] CATH LAB;  Service: Cardiovascular;;  . SKIN CANCER EXCISION  ?1960's   "between my breasts"  . TONSILLECTOMY     "school age"    Current Outpatient Medications  Medication Sig Dispense Refill  . acetaminophen (TYLENOL) 500 MG tablet Take 500-1,000 mg by mouth every 6 (six) hours as needed for mild pain, moderate pain, fever or headache.    Marland Kitchen amiodarone (PACERONE) 200 MG tablet Take 1 tablet (200 mg total) by mouth daily. 30 tablet 6  . apixaban (ELIQUIS) 2.5 MG TABS tablet Take 1 tablet (2.5 mg total) by mouth 2 (two)  times daily. 60 tablet 11  . azelastine (ASTELIN) 0.1 % nasal spray Place 1 spray into both nostrils daily as needed for rhinitis. Use in each nostril as directed    . carvedilol (COREG) 6.25 MG tablet Take 6.25 mg by mouth 2 (two) times daily with a meal. Hold for SBP<110 OR HR <60    . CRESTOR 5 MG tablet Take 2.5 mg by mouth every Monday, Wednesday, and Friday.   7  . diltiazem (CARDIZEM CD) 240 MG 24 hr capsule Take 240 mg by mouth daily.     Marland Kitchen estradiol (ESTRACE) 0.1 MG/GM vaginal cream Place 1 Applicatorful vaginally every Monday, Wednesday, and Friday.    . feeding supplement, ENSURE ENLIVE,  (ENSURE ENLIVE) LIQD Take 237 mLs by mouth 2 (two) times daily between meals. 237 mL 12  . Flaxseed, Linseed, (FLAX SEEDS PO) Take 2.5 mLs by mouth daily with breakfast.    . furosemide (LASIX) 20 MG tablet Take 20 mg by mouth.    Marland Kitchen JANUVIA 100 MG tablet Take 100 mg by mouth daily.  3  . LORazepam (ATIVAN) 0.5 MG tablet Take 0.5-1 tablets (0.25-0.5 mg total) by mouth See admin instructions. Take 0.25 mg tablet in the morning and afternoon, then 0.5 mg at bedtime 12 tablet 0  . polyethylene glycol (MIRALAX / GLYCOLAX) packet Take 17 g by mouth daily as needed for mild constipation.     . sertraline (ZOLOFT) 50 MG tablet Take 50 mg by mouth daily.  3  . Vitamin D, Ergocalciferol, (DRISDOL) 50000 UNITS CAPS capsule Take 50,000 Units by mouth every Wednesday.     No current facility-administered medications for this encounter.     Allergies  Allergen Reactions  . Sulfonamide Derivatives Rash    "it was all over my legs"  . Citalopram Hydrobromide Other (See Comments)    Pt does not remember reaction    Social History   Socioeconomic History  . Marital status: Widowed    Spouse name: Not on file  . Number of children: 2  . Years of education: Not on file  . Highest education level: Not on file  Occupational History  . Not on file  Social Needs  . Financial resource strain: Not on file  . Food insecurity:    Worry: Not on file    Inability: Not on file  . Transportation needs:    Medical: Not on file    Non-medical: Not on file  Tobacco Use  . Smoking status: Never Smoker  . Smokeless tobacco: Never Used  Substance and Sexual Activity  . Alcohol use: No  . Drug use: No  . Sexual activity: Never  Lifestyle  . Physical activity:    Days per week: Not on file    Minutes per session: Not on file  . Stress: Not on file  Relationships  . Social connections:    Talks on phone: Not on file    Gets together: Not on file    Attends religious service: Not on file    Active  member of club or organization: Not on file    Attends meetings of clubs or organizations: Not on file    Relationship status: Not on file  . Intimate partner violence:    Fear of current or ex partner: Not on file    Emotionally abused: Not on file    Physically abused: Not on file    Forced sexual activity: Not on file  Other Topics Concern  . Not  on file  Social History Narrative   Lives alone    Family History  Problem Relation Age of Onset  . Emphysema Father     ROS- All systems are reviewed and negative except as per the HPI above  Physical Exam: Vitals:   08/22/17 1509  BP: 136/64  Pulse: 77  Weight: 132 lb (59.9 kg)  Height: 5\' 2"  (1.575 m)   Wt Readings from Last 3 Encounters:  08/22/17 132 lb (59.9 kg)  06/20/17 128 lb (58.1 kg)  06/07/17 106 lb 0.7 oz (48.1 kg)  Family states that 106 lb weight is erroneous, normal weight at home is 125-130 lbs  Labs: Lab Results  Component Value Date   NA 141 06/06/2017   K 4.2 06/06/2017   CL 103 06/06/2017   CO2 28 06/06/2017   GLUCOSE 175 (H) 06/06/2017   BUN 19 06/06/2017   CREATININE 0.89 06/06/2017   CALCIUM 8.9 06/06/2017   PHOS 3.1 07/09/2012   MG 2.0 06/05/2017   Lab Results  Component Value Date   INR 1.41 07/06/2012   No results found for: CHOL, HDL, LDLCALC, TRIG   GEN- The patient is well appearing, alert and oriented x 3 today.   Head- normocephalic, atraumatic Eyes-  Sclera clear, conjunctiva pink Ears- hearing intact Oropharynx- clear Neck- supple, no JVP Lymph- no cervical lymphadenopathy Lungs- Clear to ausculation bilaterally, normal work of breathing Heart- Regular rate and rhythm, no murmurs, rubs or gallops, PMI not laterally displaced GI- soft, NT, ND, + BS Extremities- no clubbing, cyanosis, or edema MS- no significant deformity or atrophy Skin- no rash or lesion Psych- euthymic mood, full affect Neuro- strength and sensation are intact  EKG-atrial paced rhythm at 77 bpm, Pr  int 272  bpm, qrs int 86 ms, qtc 459 ms Hospital records reviewed    Assessment and Plan: 1. Afib with RVR Now appears to be in SR  Continue amiodarone 200 mg daily Continue diltiazem 240 mg daily Continue eliquis 2.5 mg bid for chadsvasc score of at least 6 Labs checked recently  with PCP   2. Chronic diastolic HF Stable  Continue monitoring weight   Low salt  3. HTN  Stable   F/u with  Chanetta Marshall, NP as scheduled early October and Dr. Martinique in April  2020 F/u in [place] clinic  As needed  [place]. Trishelle Devora, [facility] [address] [place], [place] [address] [phone]

## 2017-08-23 DIAGNOSIS — F419 Anxiety disorder, unspecified: Secondary | ICD-10-CM | POA: Diagnosis not present

## 2017-08-23 DIAGNOSIS — I5033 Acute on chronic diastolic (congestive) heart failure: Secondary | ICD-10-CM | POA: Diagnosis not present

## 2017-08-23 DIAGNOSIS — E119 Type 2 diabetes mellitus without complications: Secondary | ICD-10-CM | POA: Diagnosis not present

## 2017-08-23 DIAGNOSIS — I4891 Unspecified atrial fibrillation: Secondary | ICD-10-CM | POA: Diagnosis not present

## 2017-08-23 DIAGNOSIS — E785 Hyperlipidemia, unspecified: Secondary | ICD-10-CM | POA: Diagnosis not present

## 2017-08-23 DIAGNOSIS — F329 Major depressive disorder, single episode, unspecified: Secondary | ICD-10-CM | POA: Diagnosis not present

## 2017-08-24 DIAGNOSIS — E119 Type 2 diabetes mellitus without complications: Secondary | ICD-10-CM | POA: Diagnosis not present

## 2017-08-24 DIAGNOSIS — I5033 Acute on chronic diastolic (congestive) heart failure: Secondary | ICD-10-CM | POA: Diagnosis not present

## 2017-08-24 DIAGNOSIS — I4891 Unspecified atrial fibrillation: Secondary | ICD-10-CM | POA: Diagnosis not present

## 2017-08-24 DIAGNOSIS — F419 Anxiety disorder, unspecified: Secondary | ICD-10-CM | POA: Diagnosis not present

## 2017-08-24 DIAGNOSIS — F329 Major depressive disorder, single episode, unspecified: Secondary | ICD-10-CM | POA: Diagnosis not present

## 2017-08-24 DIAGNOSIS — E785 Hyperlipidemia, unspecified: Secondary | ICD-10-CM | POA: Diagnosis not present

## 2017-08-28 DIAGNOSIS — Z1212 Encounter for screening for malignant neoplasm of rectum: Secondary | ICD-10-CM | POA: Diagnosis not present

## 2017-08-30 DIAGNOSIS — I4891 Unspecified atrial fibrillation: Secondary | ICD-10-CM | POA: Diagnosis not present

## 2017-08-30 DIAGNOSIS — E119 Type 2 diabetes mellitus without complications: Secondary | ICD-10-CM | POA: Diagnosis not present

## 2017-08-30 DIAGNOSIS — I5033 Acute on chronic diastolic (congestive) heart failure: Secondary | ICD-10-CM | POA: Diagnosis not present

## 2017-08-30 DIAGNOSIS — F329 Major depressive disorder, single episode, unspecified: Secondary | ICD-10-CM | POA: Diagnosis not present

## 2017-08-30 DIAGNOSIS — F419 Anxiety disorder, unspecified: Secondary | ICD-10-CM | POA: Diagnosis not present

## 2017-08-30 DIAGNOSIS — E785 Hyperlipidemia, unspecified: Secondary | ICD-10-CM | POA: Diagnosis not present

## 2017-08-31 DIAGNOSIS — F329 Major depressive disorder, single episode, unspecified: Secondary | ICD-10-CM | POA: Diagnosis not present

## 2017-08-31 DIAGNOSIS — E785 Hyperlipidemia, unspecified: Secondary | ICD-10-CM | POA: Diagnosis not present

## 2017-08-31 DIAGNOSIS — I4891 Unspecified atrial fibrillation: Secondary | ICD-10-CM | POA: Diagnosis not present

## 2017-08-31 DIAGNOSIS — I5033 Acute on chronic diastolic (congestive) heart failure: Secondary | ICD-10-CM | POA: Diagnosis not present

## 2017-08-31 DIAGNOSIS — E119 Type 2 diabetes mellitus without complications: Secondary | ICD-10-CM | POA: Diagnosis not present

## 2017-08-31 DIAGNOSIS — F419 Anxiety disorder, unspecified: Secondary | ICD-10-CM | POA: Diagnosis not present

## 2017-09-05 DIAGNOSIS — E119 Type 2 diabetes mellitus without complications: Secondary | ICD-10-CM | POA: Diagnosis not present

## 2017-09-05 DIAGNOSIS — I5033 Acute on chronic diastolic (congestive) heart failure: Secondary | ICD-10-CM | POA: Diagnosis not present

## 2017-09-05 DIAGNOSIS — I4891 Unspecified atrial fibrillation: Secondary | ICD-10-CM | POA: Diagnosis not present

## 2017-09-05 DIAGNOSIS — F329 Major depressive disorder, single episode, unspecified: Secondary | ICD-10-CM | POA: Diagnosis not present

## 2017-09-05 DIAGNOSIS — E785 Hyperlipidemia, unspecified: Secondary | ICD-10-CM | POA: Diagnosis not present

## 2017-09-05 DIAGNOSIS — F419 Anxiety disorder, unspecified: Secondary | ICD-10-CM | POA: Diagnosis not present

## 2017-09-25 LAB — CUP PACEART REMOTE DEVICE CHECK
Battery Impedance: 329 Ohm
Battery Remaining Longevity: 103 mo
Battery Voltage: 2.79 V
Brady Statistic AP VP Percent: 2 %
Brady Statistic AP VS Percent: 86 %
Brady Statistic AS VP Percent: 0 %
Brady Statistic AS VS Percent: 12 %
Implantable Lead Implant Date: 20080221
Implantable Lead Location: 753859
Implantable Lead Model: 4076
Implantable Lead Model: 5076
Implantable Pulse Generator Implant Date: 20130710
Lead Channel Impedance Value: 427 Ohm
Lead Channel Impedance Value: 493 Ohm
Lead Channel Pacing Threshold Amplitude: 0.625 V
Lead Channel Pacing Threshold Amplitude: 0.875 V
Lead Channel Pacing Threshold Pulse Width: 0.4 ms
Lead Channel Pacing Threshold Pulse Width: 0.4 ms
MDC IDC LEAD IMPLANT DT: 20080221
MDC IDC LEAD LOCATION: 753860
MDC IDC SESS DTM: 20190725152843
MDC IDC SET LEADCHNL RA PACING AMPLITUDE: 2 V
MDC IDC SET LEADCHNL RV PACING AMPLITUDE: 2.5 V
MDC IDC SET LEADCHNL RV PACING PULSEWIDTH: 0.4 ms
MDC IDC SET LEADCHNL RV SENSING SENSITIVITY: 2 mV

## 2017-11-15 NOTE — Progress Notes (Signed)
Electrophysiology Office Note Date: 11/17/2017  ID:  Jo [name], DOB Jo [name] 05, 1919, MRN [id number]  PCP: Jo Infante, MD Primary Cardiologist: Jo [name] Electrophysiologist: Jo [name]  CC: Pacemaker follow-up  Jo [name] is a 84 y.o. female seen today for Dr Jo [name].  She presents today for routine electrophysiology followup.  Since last being seen in our clinic, the patient reports doing reasonably well.   She continues to struggle with anxiety and a "funny feeling in her stomach".  Daughter who is with her today states that anxiety issues are chronic and she typically improves with extra Ativan.  She denies chest pain,dyspnea, PND, orthopnea, nausea, vomiting, dizziness, syncope, edema, weight gain, or early satiety.  Device History: MDT dual chamber PPM implanted 2008 for SSS; gen change 2013   Past Medical History:  Diagnosis Date  . Anxiety   . Arthritis    "hands; between shoulders; back; feet"  . Chronic anticoagulation    -->Eliquis  . Chronic diastolic CHF (congestive heart failure) ([place])    a. 06/2011 Echo: EF 60-65%.  . Depression   . Essential hypertension   . High risk medication use    on Flecainide  . Kidney stone    passed in [place] 2014  . Moderate aortic insufficiency    a. 06/2011 Echo: EF 60-65%, mild LVH, no rwma, mod AI.  Marland Kitchen Osteoporosis   . Osteoporosis   . PAF (paroxysmal atrial fibrillation) (H[facility])    a. chronic flecainide->reduced to 25 bid 08/2014.  Marland Kitchen Panic attacks    "since losing husband 1994"  . Skin cancer ?1960's   "between breasts"  . SSS (sick sinus syndrome) ([facility])    a. s/p pacemaker. b. s/p Medtronic gen change 07/2011; c.   . Type II diabetes mellitus ([place])    "controlled w/diet"   Past Surgical History:  Procedure Laterality Date  . APPENDECTOMY  1955  . BREAST CYST EXCISION  ~ 1950   right  . BREAST LUMPECTOMY WITH NEEDLE LOCALIZATION Bilateral 02/03/2014   Procedure: BILATERAL BREAST LUMPECTOMY WITH NEEDLE LOCALIZATION ON LEFT;  Surgeon:  Jo Seltzer, MD;  Location: [facility];  Service: General;  Laterality: Bilateral;  . CARDIOVASCULAR STRESS TEST  12/09/2004  . CATARACT EXTRACTION W/ INTRAOCULAR LENS  IMPLANT, BILATERAL  1990's  . [place]  . DILATION AND CURETTAGE OF UTERUS  1953  . INSERT / REPLACE / REMOVE PACEMAKER  03/16/2006  . PERMANENT PACEMAKER GENERATOR CHANGE  08/03/2011   Procedure: PERMANENT PACEMAKER GENERATOR CHANGE;  Surgeon: Jo Grayer, MD;  Location: [facility] CATH LAB;  Service: Cardiovascular;;  . SKIN CANCER EXCISION  ?1960's   "between my breasts"  . TONSILLECTOMY     "school age"    Current Outpatient Medications  Medication Sig Dispense Refill  . acetaminophen (TYLENOL) 500 MG tablet Take 500-1,000 mg by mouth every 6 (six) hours as needed for mild pain, moderate pain, fever or headache.    Marland Kitchen amiodarone (PACERONE) 200 MG tablet Take 1 tablet (200 mg total) by mouth daily. 30 tablet 6  . apixaban (ELIQUIS) 2.5 MG TABS tablet Take 1 tablet (2.5 mg total) by mouth 2 (two) times daily. 60 tablet 11  . azelastine (ASTELIN) 0.1 % nasal spray Place 1 spray into both nostrils daily as needed for rhinitis. Use in each nostril as directed    . carvedilol (COREG) 6.25 MG tablet Take 6.25 mg by mouth 2 (two) times daily with a meal. Hold for SBP<110 OR HR <60    .  CRESTOR 5 MG tablet Take 2.5 mg by mouth every Monday, Wednesday, and Friday.   7  . diltiazem (CARDIZEM CD) 240 MG 24 hr capsule Take 240 mg by mouth daily.     Marland Kitchen estradiol (ESTRACE) 0.1 MG/GM vaginal cream Place 1 Applicatorful vaginally every Monday, Wednesday, and Friday.    . feeding supplement, ENSURE ENLIVE, (ENSURE ENLIVE) LIQD Take 237 mLs by mouth 2 (two) times daily between meals. 237 mL 12  . Flaxseed, Linseed, (FLAX SEEDS PO) Take 2.5 mLs by mouth daily with breakfast.    . furosemide (LASIX) 20 MG tablet Take 20 mg by mouth.    Marland Kitchen JANUVIA 100 MG tablet Take 100 mg by mouth daily.  3  . LORazepam (ATIVAN) 0.5 MG tablet Take  0.5-1 tablets (0.25-0.5 mg total) by mouth See admin instructions. Take 0.25 mg tablet in the morning and afternoon, then 0.5 mg at bedtime 12 tablet 0  . polyethylene glycol (MIRALAX / GLYCOLAX) packet Take 17 g by mouth daily as needed for mild constipation.     . sertraline (ZOLOFT) 50 MG tablet Take 50 mg by mouth daily.  3  . Vitamin D, Ergocalciferol, (DRISDOL) 50000 UNITS CAPS capsule Take 50,000 Units by mouth every Wednesday.     No current facility-administered medications for this visit.     Allergies:   Sulfonamide derivatives and Citalopram hydrobromide   Social History: Social History   Socioeconomic History  . Marital status: Widowed    Spouse name: Not on file  . Number of children: 2  . Years of education: Not on file  . Highest education level: Not on file  Occupational History  . Not on file  Social Needs  . Financial resource strain: Not on file  . Food insecurity:    Worry: Not on file    Inability: Not on file  . Transportation needs:    Medical: Not on file    Non-medical: Not on file  Tobacco Use  . Smoking status: Never Smoker  . Smokeless tobacco: Never Used  Substance and Sexual Activity  . Alcohol use: No  . Drug use: No  . Sexual activity: Never  Lifestyle  . Physical activity:    Days per week: Not on file    Minutes per session: Not on file  . Stress: Not on file  Relationships  . Social connections:    Talks on phone: Not on file    Gets together: Not on file    Attends religious service: Not on file    Active member of club or organization: Not on file    Attends meetings of clubs or organizations: Not on file    Relationship status: Not on file  . Intimate partner violence:    Fear of current or ex partner: Not on file    Emotionally abused: Not on file    Physically abused: Not on file    Forced sexual activity: Not on file  Other Topics Concern  . Not on file  Social History Narrative   Lives alone    Family  History: Family History  Problem Relation Age of Onset  . Emphysema Father      Review of Systems: All other systems reviewed and are otherwise negative except as noted above.   Physical Exam: VS:  BP 140/68   Pulse 83   Ht 5\' 2"  (1.575 m)   Wt 134 lb (60.8 kg)   PF 93 L/min   BMI 24.51 kg/m  ,  BMI Body mass index is 24.51 kg/m.  GEN- The patient is elderly appearing, alert and oriented x 3 today.   HEENT: normocephalic, atraumatic; sclera clear, conjunctiva pink; hearing intact; oropharynx clear; neck supple  Lungs- Clear to ausculation bilaterally, normal work of breathing.  No wheezes, rales, rhonchi Heart- Regular rate and rhythm  GI- soft, non-tender, non-distended, bowel sounds present  Extremities- no clubbing, cyanosis, or edema  MS- no significant deformity or atrophy Skin- warm and dry, no rash or lesion; PPM pocket well healed Psych- euthymic mood, full affect Neuro- strength and sensation are intact  PPM Interrogation- reviewed in detail today,  See PACEART report  EKG:  EKG is not ordered today.  Recent Labs: 11/24/2016: ALT 15; TSH 2.559 06/04/2017: B Natriuretic Peptide 351.1 06/05/2017: Hemoglobin 10.1; Magnesium 2.0; Platelets 242 06/06/2017: BUN 19; Creatinine, Ser 0.89; Potassium 4.2; Sodium 141   Wt Readings from Last 3 Encounters:  11/17/17 134 lb (60.8 kg)  08/22/17 132 lb (59.9 kg)  06/20/17 128 lb (58.1 kg)     Other studies Reviewed: Additional studies/ records that were reviewed today include: Dr Jo [name], Dr Jo [name], AF clinic notes  Assessment and Plan:  1.  Symptomatic sinus node dysfunction Normal PPM function See Pace Art report No changes today  2.  Paroxysmal atrial fibrillation No AF since September Continue amiodarone Continue Eliquis for [facility] Labs followed by Dr Joylene Draft  3.  HTN Stable No change required today   Current medicines are reviewed at length with the patient today.   The patient does not have  concerns regarding her medicines.  The following changes were made today:  none  Labs/ tests ordered today include:   Orders Placed This Encounter  Procedures  . CUP PACEART INCLINIC DEVICE CHECK     Disposition:   Follow up with [facility], Dr Jo [name] as scheduled, me in 1 year    Signed, Chanetta Marshall, NP 11/17/2017 11:57 AM  [facility] [address] [place] [address] [phone] (office) [phone] (fax)

## 2017-11-16 ENCOUNTER — Ambulatory Visit (INDEPENDENT_AMBULATORY_CARE_PROVIDER_SITE_OTHER): Payer: Self-pay | Admitting: *Deleted

## 2017-11-16 DIAGNOSIS — I495 Sick sinus syndrome: Secondary | ICD-10-CM

## 2017-11-16 DIAGNOSIS — I5032 Chronic diastolic (congestive) heart failure: Secondary | ICD-10-CM

## 2017-11-16 NOTE — Progress Notes (Signed)
Remote pacemaker transmission.   

## 2017-11-17 ENCOUNTER — Ambulatory Visit (INDEPENDENT_AMBULATORY_CARE_PROVIDER_SITE_OTHER): Payer: Medicare Other | Admitting: Nurse Practitioner

## 2017-11-17 ENCOUNTER — Encounter: Payer: Self-pay | Admitting: Nurse Practitioner

## 2017-11-17 VITALS — BP 140/68 | HR 83 | Ht 62.0 in | Wt 134.0 lb

## 2017-11-17 DIAGNOSIS — I10 Essential (primary) hypertension: Secondary | ICD-10-CM

## 2017-11-17 DIAGNOSIS — I495 Sick sinus syndrome: Secondary | ICD-10-CM

## 2017-11-17 DIAGNOSIS — I480 Paroxysmal atrial fibrillation: Secondary | ICD-10-CM

## 2017-11-17 LAB — CUP PACEART INCLINIC DEVICE CHECK
Implantable Lead Implant Date: 20080221
Implantable Lead Implant Date: 20080221
Implantable Lead Location: 753859
Implantable Lead Model: 4076
Implantable Lead Model: 5076
Implantable Pulse Generator Implant Date: 20130710
MDC IDC LEAD LOCATION: 753860
MDC IDC SESS DTM: 20191025113909

## 2017-11-17 NOTE — Patient Instructions (Signed)
Medication Instructions:  NONE If you need a refill on your cardiac medications before your next appointment, please call your pharmacy.   Lab work: NONE If you have labs (blood work) drawn today and your tests are completely normal, you will receive your results only by: Marland Kitchen MyChart Message (if you have MyChart) OR . A paper copy in the mail If you have any lab test that is abnormal or we need to change your treatment, we will call you to review the results.  Testing/Procedures: NONE  Follow-Up: Remote monitoring is used to monitor your Pacemaker  from home. This monitoring reduces the number of office visits required to check your device to one time per year. It allows [place] to keep an eye on the functioning of your device to ensure it is working properly. You are scheduled for a device check from home on 02/15/2018. You [place] send your transmission at any time that day. If you have a wireless device, the transmission will be sent automatically. After your physician reviews your transmission, you will receive a postcard with your next transmission date.   At [facility], you and your health needs are our priority.  As part of our continuing mission to provide you with exceptional heart care, we have created designated Provider Care Teams.  These Care Teams include your primary Cardiologist (physician) and Advanced Practice Providers (APPs -  Physician Assistants and Nurse Practitioners) who all work together to provide you with the care you need, when you need it. You will need a follow up appointment in 1 years.  Please call our office 2 months in advance to schedule this appointment.  You Wile see Jo Marshall Jo [name] or one of the following Advanced Practice Providers on your designated Care Team:   Jo Marshall, Jo [name] . Tommye Standard, PA-C  Any Other Special Instructions Will Be Listed Below (If Applicable).

## 2017-12-08 LAB — CUP PACEART REMOTE DEVICE CHECK
Battery Remaining Longevity: 103 mo
Battery Voltage: 2.79 V
Brady Statistic AP VP Percent: 2 %
Brady Statistic AS VP Percent: 0 %
Implantable Lead Implant Date: 20080221
Implantable Lead Model: 4076
Implantable Lead Model: 5076
Implantable Pulse Generator Implant Date: 20130710
Lead Channel Pacing Threshold Amplitude: 0.625 V
Lead Channel Pacing Threshold Pulse Width: 0.4 ms
Lead Channel Pacing Threshold Pulse Width: 0.4 ms
Lead Channel Setting Pacing Amplitude: 2.5 V
Lead Channel Setting Pacing Pulse Width: 0.4 ms
MDC IDC LEAD IMPLANT DT: 20080221
MDC IDC LEAD LOCATION: 753859
MDC IDC LEAD LOCATION: 753860
MDC IDC MSMT BATTERY IMPEDANCE: 329 Ohm
MDC IDC MSMT LEADCHNL RA IMPEDANCE VALUE: 500 Ohm
MDC IDC MSMT LEADCHNL RV IMPEDANCE VALUE: 442 Ohm
MDC IDC MSMT LEADCHNL RV PACING THRESHOLD AMPLITUDE: 1 V
MDC IDC MSMT LEADCHNL RV SENSING INTR AMPL: 5.6 mV
MDC IDC SESS DTM: 20191024144905
MDC IDC SET LEADCHNL RA PACING AMPLITUDE: 2 V
MDC IDC SET LEADCHNL RV SENSING SENSITIVITY: 2.8 mV
MDC IDC STAT BRADY AP VS PERCENT: 92 %
MDC IDC STAT BRADY AS VS PERCENT: 6 %

## 2017-12-19 DIAGNOSIS — I4891 Unspecified atrial fibrillation: Secondary | ICD-10-CM | POA: Diagnosis not present

## 2017-12-19 DIAGNOSIS — E1169 Type 2 diabetes mellitus with other specified complication: Secondary | ICD-10-CM | POA: Diagnosis not present

## 2017-12-19 DIAGNOSIS — H6121 Impacted cerumen, right ear: Secondary | ICD-10-CM | POA: Diagnosis not present

## 2017-12-19 DIAGNOSIS — Z950 Presence of cardiac pacemaker: Secondary | ICD-10-CM | POA: Diagnosis not present

## 2017-12-19 DIAGNOSIS — C50919 Malignant neoplasm of unspecified site of unspecified female breast: Secondary | ICD-10-CM | POA: Diagnosis not present

## 2017-12-19 DIAGNOSIS — I10 Essential (primary) hypertension: Secondary | ICD-10-CM | POA: Diagnosis not present

## 2017-12-19 DIAGNOSIS — Z6824 Body mass index (BMI) 24.0-24.9, adult: Secondary | ICD-10-CM | POA: Diagnosis not present

## 2017-12-19 DIAGNOSIS — Z23 Encounter for immunization: Secondary | ICD-10-CM | POA: Diagnosis not present

## 2018-02-15 ENCOUNTER — Ambulatory Visit (INDEPENDENT_AMBULATORY_CARE_PROVIDER_SITE_OTHER): Payer: Medicare Other

## 2018-02-15 DIAGNOSIS — I495 Sick sinus syndrome: Secondary | ICD-10-CM

## 2018-02-16 ENCOUNTER — Encounter: Payer: Self-pay | Admitting: Cardiology

## 2018-02-16 NOTE — Progress Notes (Signed)
Remote pacemaker transmission.   

## 2018-02-18 LAB — CUP PACEART REMOTE DEVICE CHECK
Battery Impedance: 403 Ohm
Battery Remaining Longevity: 96 mo
Battery Voltage: 2.79 V
Brady Statistic AP VS Percent: 99 %
Brady Statistic AS VP Percent: 0 %
Brady Statistic AS VS Percent: 0 %
Implantable Lead Implant Date: 20080221
Implantable Lead Implant Date: 20080221
Implantable Lead Location: 753859
Implantable Lead Model: 4076
Implantable Lead Model: 5076
Implantable Pulse Generator Implant Date: 20130710
Lead Channel Impedance Value: 425 Ohm
Lead Channel Impedance Value: 493 Ohm
Lead Channel Pacing Threshold Amplitude: 0.75 V
Lead Channel Pacing Threshold Amplitude: 1.125 V
Lead Channel Pacing Threshold Pulse Width: 0.4 ms
Lead Channel Pacing Threshold Pulse Width: 0.4 ms
Lead Channel Setting Pacing Amplitude: 2 V
Lead Channel Setting Pacing Amplitude: 2.5 V
Lead Channel Setting Pacing Pulse Width: 0.4 ms
Lead Channel Setting Sensing Sensitivity: 2.8 mV
MDC IDC LEAD LOCATION: 753860
MDC IDC SESS DTM: 20200123174348
MDC IDC STAT BRADY AP VP PERCENT: 0 %

## 2018-02-19 ENCOUNTER — Other Ambulatory Visit (HOSPITAL_COMMUNITY): Payer: Self-pay | Admitting: Nurse Practitioner

## 2018-02-21 NOTE — Telephone Encounter (Signed)
This is a A-Fib clinic pt 

## 2018-04-11 DIAGNOSIS — I110 Hypertensive heart disease with heart failure: Secondary | ICD-10-CM | POA: Diagnosis not present

## 2018-04-11 DIAGNOSIS — Z6823 Body mass index (BMI) 23.0-23.9, adult: Secondary | ICD-10-CM | POA: Diagnosis not present

## 2018-04-11 DIAGNOSIS — I5032 Chronic diastolic (congestive) heart failure: Secondary | ICD-10-CM | POA: Diagnosis not present

## 2018-04-11 DIAGNOSIS — R600 Localized edema: Secondary | ICD-10-CM | POA: Diagnosis not present

## 2018-04-26 DIAGNOSIS — I480 Paroxysmal atrial fibrillation: Secondary | ICD-10-CM | POA: Diagnosis not present

## 2018-04-26 DIAGNOSIS — I5032 Chronic diastolic (congestive) heart failure: Secondary | ICD-10-CM | POA: Diagnosis not present

## 2018-04-26 DIAGNOSIS — E1169 Type 2 diabetes mellitus with other specified complication: Secondary | ICD-10-CM | POA: Diagnosis not present

## 2018-04-26 DIAGNOSIS — R600 Localized edema: Secondary | ICD-10-CM | POA: Diagnosis not present

## 2018-04-26 DIAGNOSIS — C50919 Malignant neoplasm of unspecified site of unspecified female breast: Secondary | ICD-10-CM | POA: Diagnosis not present

## 2018-04-26 DIAGNOSIS — Z950 Presence of cardiac pacemaker: Secondary | ICD-10-CM | POA: Diagnosis not present

## 2018-04-26 DIAGNOSIS — I110 Hypertensive heart disease with heart failure: Secondary | ICD-10-CM | POA: Diagnosis not present

## 2018-05-16 ENCOUNTER — Telehealth: Payer: Self-pay

## 2018-05-16 DIAGNOSIS — Z79899 Other long term (current) drug therapy: Secondary | ICD-10-CM | POA: Diagnosis not present

## 2018-05-16 DIAGNOSIS — E1169 Type 2 diabetes mellitus with other specified complication: Secondary | ICD-10-CM | POA: Diagnosis not present

## 2018-05-16 DIAGNOSIS — I110 Hypertensive heart disease with heart failure: Secondary | ICD-10-CM | POA: Diagnosis not present

## 2018-05-16 NOTE — Telephone Encounter (Signed)
Left a voicemail message for patient to call the office to schedule a virtual or tele visit due to covid 19.

## 2018-05-17 ENCOUNTER — Other Ambulatory Visit: Payer: Self-pay

## 2018-05-17 ENCOUNTER — Ambulatory Visit (INDEPENDENT_AMBULATORY_CARE_PROVIDER_SITE_OTHER): Payer: Medicare Other | Admitting: *Deleted

## 2018-05-17 DIAGNOSIS — I495 Sick sinus syndrome: Secondary | ICD-10-CM | POA: Diagnosis not present

## 2018-05-17 LAB — CUP PACEART REMOTE DEVICE CHECK
Battery Impedance: 427 Ohm
Battery Remaining Longevity: 93 mo
Battery Voltage: 2.79 V
Brady Statistic AP VP Percent: 0 %
Brady Statistic AP VS Percent: 100 %
Brady Statistic AS VP Percent: 0 %
Brady Statistic AS VS Percent: 0 %
Date Time Interrogation Session: 20200423154339
Implantable Lead Implant Date: 20080221
Implantable Lead Implant Date: 20080221
Implantable Lead Location: 753859
Implantable Lead Location: 753860
Implantable Lead Model: 4076
Implantable Lead Model: 5076
Implantable Pulse Generator Implant Date: 20130710
Lead Channel Impedance Value: 424 Ohm
Lead Channel Impedance Value: 500 Ohm
Lead Channel Pacing Threshold Amplitude: 0.75 V
Lead Channel Pacing Threshold Amplitude: 1 V
Lead Channel Pacing Threshold Pulse Width: 0.4 ms
Lead Channel Pacing Threshold Pulse Width: 0.4 ms
Lead Channel Sensing Intrinsic Amplitude: 5.6 mV
Lead Channel Setting Pacing Amplitude: 2 V
Lead Channel Setting Pacing Amplitude: 2.5 V
Lead Channel Setting Pacing Pulse Width: 0.4 ms
Lead Channel Setting Sensing Sensitivity: 2.8 mV

## 2018-05-25 ENCOUNTER — Encounter: Payer: Self-pay | Admitting: Cardiology

## 2018-05-25 NOTE — Progress Notes (Signed)
Remote pacemaker transmission.   

## 2018-05-29 ENCOUNTER — Ambulatory Visit: Payer: PRIVATE HEALTH INSURANCE | Admitting: Cardiology

## 2018-06-19 DIAGNOSIS — I5032 Chronic diastolic (congestive) heart failure: Secondary | ICD-10-CM | POA: Diagnosis not present

## 2018-06-19 DIAGNOSIS — R600 Localized edema: Secondary | ICD-10-CM | POA: Diagnosis not present

## 2018-06-19 DIAGNOSIS — M545 Low back pain: Secondary | ICD-10-CM | POA: Diagnosis not present

## 2018-06-19 DIAGNOSIS — R0609 Other forms of dyspnea: Secondary | ICD-10-CM | POA: Diagnosis not present

## 2018-06-19 DIAGNOSIS — R300 Dysuria: Secondary | ICD-10-CM | POA: Diagnosis not present

## 2018-06-19 DIAGNOSIS — K5900 Constipation, unspecified: Secondary | ICD-10-CM | POA: Diagnosis not present

## 2018-06-19 DIAGNOSIS — I110 Hypertensive heart disease with heart failure: Secondary | ICD-10-CM | POA: Diagnosis not present

## 2018-06-19 DIAGNOSIS — I480 Paroxysmal atrial fibrillation: Secondary | ICD-10-CM | POA: Diagnosis not present

## 2018-06-20 DIAGNOSIS — E7849 Other hyperlipidemia: Secondary | ICD-10-CM | POA: Diagnosis not present

## 2018-06-20 DIAGNOSIS — R8290 Unspecified abnormal findings in urine: Secondary | ICD-10-CM | POA: Diagnosis not present

## 2018-06-20 DIAGNOSIS — N390 Urinary tract infection, site not specified: Secondary | ICD-10-CM | POA: Diagnosis not present

## 2018-06-23 ENCOUNTER — Emergency Department (HOSPITAL_COMMUNITY): Payer: Medicare Other

## 2018-06-23 ENCOUNTER — Other Ambulatory Visit: Payer: Self-pay

## 2018-06-23 ENCOUNTER — Inpatient Hospital Stay (HOSPITAL_COMMUNITY)
Admission: EM | Admit: 2018-06-23 | Discharge: 2018-06-27 | DRG: 291 | Disposition: A | Discharge status: Home Health | Payer: Medicare Other | Attending: Internal Medicine | Admitting: Internal Medicine

## 2018-06-23 ENCOUNTER — Encounter (HOSPITAL_COMMUNITY): Payer: Self-pay | Admitting: Emergency Medicine

## 2018-06-23 DIAGNOSIS — Z20828 Contact with and (suspected) exposure to other viral communicable diseases: Secondary | ICD-10-CM | POA: Diagnosis not present

## 2018-06-23 DIAGNOSIS — R0602 Shortness of breath: Secondary | ICD-10-CM | POA: Diagnosis not present

## 2018-06-23 DIAGNOSIS — R109 Unspecified abdominal pain: Secondary | ICD-10-CM | POA: Diagnosis not present

## 2018-06-23 DIAGNOSIS — F329 Major depressive disorder, single episode, unspecified: Secondary | ICD-10-CM | POA: Diagnosis present

## 2018-06-23 DIAGNOSIS — Z882 Allergy status to sulfonamides status: Secondary | ICD-10-CM

## 2018-06-23 DIAGNOSIS — Z888 Allergy status to other drugs, medicaments and biological substances status: Secondary | ICD-10-CM

## 2018-06-23 DIAGNOSIS — H9190 Unspecified hearing loss, unspecified ear: Secondary | ICD-10-CM | POA: Diagnosis present

## 2018-06-23 DIAGNOSIS — R1084 Generalized abdominal pain: Secondary | ICD-10-CM | POA: Diagnosis not present

## 2018-06-23 DIAGNOSIS — I110 Hypertensive heart disease with heart failure: Secondary | ICD-10-CM | POA: Diagnosis not present

## 2018-06-23 DIAGNOSIS — Z85828 Personal history of other malignant neoplasm of skin: Secondary | ICD-10-CM

## 2018-06-23 DIAGNOSIS — J189 Pneumonia, unspecified organism: Secondary | ICD-10-CM | POA: Diagnosis present

## 2018-06-23 DIAGNOSIS — I351 Nonrheumatic aortic (valve) insufficiency: Secondary | ICD-10-CM | POA: Diagnosis present

## 2018-06-23 DIAGNOSIS — Z961 Presence of intraocular lens: Secondary | ICD-10-CM | POA: Diagnosis present

## 2018-06-23 DIAGNOSIS — N1330 Unspecified hydronephrosis: Secondary | ICD-10-CM | POA: Diagnosis not present

## 2018-06-23 DIAGNOSIS — I495 Sick sinus syndrome: Secondary | ICD-10-CM | POA: Diagnosis not present

## 2018-06-23 DIAGNOSIS — K7689 Other specified diseases of liver: Secondary | ICD-10-CM | POA: Diagnosis not present

## 2018-06-23 DIAGNOSIS — M19042 Primary osteoarthritis, left hand: Secondary | ICD-10-CM | POA: Diagnosis present

## 2018-06-23 DIAGNOSIS — I10 Essential (primary) hypertension: Secondary | ICD-10-CM | POA: Diagnosis present

## 2018-06-23 DIAGNOSIS — I5033 Acute on chronic diastolic (congestive) heart failure: Secondary | ICD-10-CM | POA: Diagnosis present

## 2018-06-23 DIAGNOSIS — M810 Age-related osteoporosis without current pathological fracture: Secondary | ICD-10-CM | POA: Diagnosis present

## 2018-06-23 DIAGNOSIS — Z9841 Cataract extraction status, right eye: Secondary | ICD-10-CM

## 2018-06-23 DIAGNOSIS — M545 Low back pain: Secondary | ICD-10-CM | POA: Diagnosis not present

## 2018-06-23 DIAGNOSIS — K5900 Constipation, unspecified: Secondary | ICD-10-CM | POA: Diagnosis present

## 2018-06-23 DIAGNOSIS — F410 Panic disorder [episodic paroxysmal anxiety] without agoraphobia: Secondary | ICD-10-CM | POA: Diagnosis present

## 2018-06-23 DIAGNOSIS — I5032 Chronic diastolic (congestive) heart failure: Secondary | ICD-10-CM

## 2018-06-23 DIAGNOSIS — R0902 Hypoxemia: Secondary | ICD-10-CM | POA: Diagnosis not present

## 2018-06-23 DIAGNOSIS — I480 Paroxysmal atrial fibrillation: Secondary | ICD-10-CM | POA: Diagnosis not present

## 2018-06-23 DIAGNOSIS — Z7901 Long term (current) use of anticoagulants: Secondary | ICD-10-CM

## 2018-06-23 DIAGNOSIS — E119 Type 2 diabetes mellitus without complications: Secondary | ICD-10-CM | POA: Diagnosis not present

## 2018-06-23 DIAGNOSIS — Z87442 Personal history of urinary calculi: Secondary | ICD-10-CM

## 2018-06-23 DIAGNOSIS — Z79899 Other long term (current) drug therapy: Secondary | ICD-10-CM

## 2018-06-23 DIAGNOSIS — Z825 Family history of asthma and other chronic lower respiratory diseases: Secondary | ICD-10-CM

## 2018-06-23 DIAGNOSIS — Z9842 Cataract extraction status, left eye: Secondary | ICD-10-CM

## 2018-06-23 DIAGNOSIS — M5489 Other dorsalgia: Secondary | ICD-10-CM | POA: Diagnosis not present

## 2018-06-23 DIAGNOSIS — Z7984 Long term (current) use of oral hypoglycemic drugs: Secondary | ICD-10-CM

## 2018-06-23 DIAGNOSIS — Z66 Do not resuscitate: Secondary | ICD-10-CM | POA: Diagnosis present

## 2018-06-23 DIAGNOSIS — E876 Hypokalemia: Secondary | ICD-10-CM | POA: Diagnosis present

## 2018-06-23 DIAGNOSIS — M19041 Primary osteoarthritis, right hand: Secondary | ICD-10-CM | POA: Diagnosis present

## 2018-06-23 DIAGNOSIS — Z950 Presence of cardiac pacemaker: Secondary | ICD-10-CM

## 2018-06-23 LAB — URINALYSIS, ROUTINE W REFLEX MICROSCOPIC
Bilirubin Urine: NEGATIVE
Glucose, UA: NEGATIVE mg/dL
Hgb urine dipstick: NEGATIVE
Ketones, ur: NEGATIVE mg/dL
Leukocytes,Ua: NEGATIVE
Nitrite: NEGATIVE
Protein, ur: NEGATIVE mg/dL
Specific Gravity, Urine: 1.003 — ABNORMAL LOW (ref 1.005–1.030)
pH: 7 (ref 5.0–8.0)

## 2018-06-23 LAB — CBC WITH DIFFERENTIAL/PLATELET
Abs Immature Granulocytes: 0.2 10*3/uL — ABNORMAL HIGH (ref 0.00–0.07)
Basophils Absolute: 0 10*3/uL (ref 0.0–0.1)
Basophils Relative: 0 %
Eosinophils Absolute: 0.1 10*3/uL (ref 0.0–0.5)
Eosinophils Relative: 0 %
HCT: 36.2 % (ref 36.0–46.0)
Hemoglobin: 11.8 g/dL — ABNORMAL LOW (ref 12.0–15.0)
Immature Granulocytes: 1 %
Lymphocytes Relative: 11 %
Lymphs Abs: 1.6 10*3/uL (ref 0.7–4.0)
MCH: 31.1 pg (ref 26.0–34.0)
MCHC: 32.6 g/dL (ref 30.0–36.0)
MCV: 95.5 fL (ref 80.0–100.0)
Monocytes Absolute: 1.2 10*3/uL — ABNORMAL HIGH (ref 0.1–1.0)
Monocytes Relative: 8 %
Neutro Abs: 11.2 10*3/uL — ABNORMAL HIGH (ref 1.7–7.7)
Neutrophils Relative %: 80 %
Platelets: 222 10*3/uL (ref 150–400)
RBC: 3.79 MIL/uL — ABNORMAL LOW (ref 3.87–5.11)
RDW: 14.6 % (ref 11.5–15.5)
WBC: 14.2 10*3/uL — ABNORMAL HIGH (ref 4.0–10.5)
nRBC: 0 % (ref 0.0–0.2)

## 2018-06-23 LAB — I-STAT TROPONIN, ED: Troponin i, poc: 0.01 ng/mL (ref 0.00–0.08)

## 2018-06-23 LAB — LACTIC ACID, PLASMA: Lactic Acid, Venous: 1 mmol/L (ref 0.5–1.9)

## 2018-06-23 LAB — SARS CORONAVIRUS 2 BY RT PCR (HOSPITAL ORDER, PERFORMED IN ~~LOC~~ HOSPITAL LAB): SARS Coronavirus 2: NEGATIVE

## 2018-06-23 LAB — COMPREHENSIVE METABOLIC PANEL
ALT: 19 U/L (ref 0–44)
AST: 20 U/L (ref 15–41)
Albumin: 3.4 g/dL — ABNORMAL LOW (ref 3.5–5.0)
Alkaline Phosphatase: 79 U/L (ref 38–126)
Anion gap: 8 (ref 5–15)
BUN: 10 mg/dL (ref 8–23)
CO2: 25 mmol/L (ref 22–32)
Calcium: 8.4 mg/dL — ABNORMAL LOW (ref 8.9–10.3)
Chloride: 99 mmol/L (ref 98–111)
Creatinine, Ser: 0.66 mg/dL (ref 0.44–1.00)
GFR calc Af Amer: >60 mL/min (ref >60)
GFR calc non Af Amer: >60 mL/min (ref >60)
Glucose, Bld: 145 mg/dL — ABNORMAL HIGH (ref 70–99)
Potassium: 3.7 mmol/L (ref 3.5–5.1)
Sodium: 132 mmol/L — ABNORMAL LOW (ref 135–145)
Total Bilirubin: 0.6 mg/dL (ref 0.3–1.2)
Total Protein: 6.4 g/dL — ABNORMAL LOW (ref 6.5–8.1)

## 2018-06-23 LAB — PROCALCITONIN: Procalcitonin: <0.1 ng/mL

## 2018-06-23 LAB — GLUCOSE, CAPILLARY: Glucose-Capillary: 135 mg/dL — ABNORMAL HIGH (ref 70–99)

## 2018-06-23 LAB — LIPASE, BLOOD: Lipase: 23 U/L (ref 11–51)

## 2018-06-23 LAB — BRAIN NATRIURETIC PEPTIDE: B Natriuretic Peptide: 343.6 pg/mL — ABNORMAL HIGH (ref 0.0–100.0)

## 2018-06-23 MED ORDER — SODIUM CHLORIDE 0.9 % IV SOLN
2.0000 g | INTRAVENOUS | Status: DC
  Administered 2018-06-23: 2 g via INTRAVENOUS
  Filled 2018-06-23: qty 20

## 2018-06-23 MED ORDER — LORAZEPAM 0.5 MG PO TABS
0.2500 mg | ORAL_TABLET | Freq: Two times a day (BID) | ORAL | Status: DC
  Administered 2018-06-24 – 2018-06-27 (×8): 0.25 mg via ORAL
  Filled 2018-06-23 (×8): qty 1

## 2018-06-23 MED ORDER — INSULIN ASPART 100 UNIT/ML ~~LOC~~ SOLN
0.0000 [IU] | Freq: Three times a day (TID) | SUBCUTANEOUS | Status: DC
  Administered 2018-06-25: 3 [IU] via SUBCUTANEOUS
  Administered 2018-06-25 – 2018-06-27 (×4): 2 [IU] via SUBCUTANEOUS
  Administered 2018-06-27: 1 [IU] via SUBCUTANEOUS

## 2018-06-23 MED ORDER — SODIUM CHLORIDE 0.9 % IV SOLN
1.0000 g | INTRAVENOUS | Status: DC
  Administered 2018-06-24 – 2018-06-25 (×2): 1 g via INTRAVENOUS
  Filled 2018-06-23 (×2): qty 1

## 2018-06-23 MED ORDER — DILTIAZEM HCL ER COATED BEADS 240 MG PO CP24
240.0000 mg | ORAL_CAPSULE | Freq: Every day | ORAL | Status: DC
  Administered 2018-06-24 – 2018-06-25 (×2): 240 mg via ORAL
  Filled 2018-06-23 (×2): qty 1

## 2018-06-23 MED ORDER — SODIUM CHLORIDE 0.9% FLUSH
3.0000 mL | Freq: Two times a day (BID) | INTRAVENOUS | Status: DC
  Administered 2018-06-23 – 2018-06-25 (×3): 3 mL via INTRAVENOUS

## 2018-06-23 MED ORDER — ROSUVASTATIN CALCIUM 5 MG PO TABS
2.5000 mg | ORAL_TABLET | ORAL | Status: DC
  Administered 2018-06-25 – 2018-06-27 (×2): 2.5 mg via ORAL
  Filled 2018-06-23 (×2): qty 1

## 2018-06-23 MED ORDER — APIXABAN 2.5 MG PO TABS
2.5000 mg | ORAL_TABLET | Freq: Two times a day (BID) | ORAL | Status: DC
  Administered 2018-06-24 – 2018-06-27 (×7): 2.5 mg via ORAL
  Filled 2018-06-23 (×7): qty 1

## 2018-06-23 MED ORDER — MAGNESIUM HYDROXIDE 400 MG/5ML PO SUSP: 30.0000 mL | Freq: Every day | ORAL | Status: DC | PRN

## 2018-06-23 MED ORDER — ACETAMINOPHEN 325 MG PO TABS
650.0000 mg | ORAL_TABLET | Freq: Four times a day (QID) | ORAL | Status: DC | PRN
  Administered 2018-06-24 – 2018-06-26 (×4): 650 mg via ORAL
  Filled 2018-06-23 (×4): qty 2

## 2018-06-23 MED ORDER — SODIUM CHLORIDE 0.9% FLUSH: 3.0000 mL | INTRAVENOUS | Status: DC | PRN

## 2018-06-23 MED ORDER — ACETAMINOPHEN 650 MG RE SUPP: 650.0000 mg | Freq: Four times a day (QID) | RECTAL | Status: DC | PRN

## 2018-06-23 MED ORDER — CARVEDILOL 6.25 MG PO TABS
6.2500 mg | ORAL_TABLET | Freq: Two times a day (BID) | ORAL | Status: DC
  Administered 2018-06-24 – 2018-06-27 (×8): 6.25 mg via ORAL
  Filled 2018-06-23 (×8): qty 1

## 2018-06-23 MED ORDER — FUROSEMIDE 10 MG/ML IJ SOLN
20.0000 mg | Freq: Once | INTRAMUSCULAR | Status: AC
  Administered 2018-06-23: 20 mg via INTRAVENOUS
  Filled 2018-06-23: qty 4

## 2018-06-23 MED ORDER — SODIUM CHLORIDE 0.9 % IV SOLN
500.0000 mg | INTRAVENOUS | Status: DC
  Administered 2018-06-23: 500 mg via INTRAVENOUS
  Filled 2018-06-23: qty 500

## 2018-06-23 MED ORDER — BISACODYL 10 MG RE SUPP
10.0000 mg | Freq: Once | RECTAL | Status: AC
  Administered 2018-06-23: 10 mg via RECTAL
  Filled 2018-06-23: qty 1

## 2018-06-23 MED ORDER — SODIUM CHLORIDE 0.9 % IV SOLN
INTRAVENOUS | Status: DC
  Administered 2018-06-23: 13:00:00 via INTRAVENOUS

## 2018-06-23 MED ORDER — LORAZEPAM 0.5 MG PO TABS
0.5000 mg | ORAL_TABLET | Freq: Every day | ORAL | Status: DC
  Administered 2018-06-23 – 2018-06-26 (×4): 0.5 mg via ORAL
  Filled 2018-06-23 (×4): qty 1

## 2018-06-23 MED ORDER — LORAZEPAM 0.5 MG PO TABS: 0.2500 mg | ORAL_TABLET | ORAL | Status: DC

## 2018-06-23 MED ORDER — POLYETHYLENE GLYCOL 3350 17 G PO PACK
17.0000 g | PACK | Freq: Two times a day (BID) | ORAL | Status: DC
  Administered 2018-06-23 – 2018-06-26 (×7): 17 g via ORAL
  Filled 2018-06-23 (×7): qty 1

## 2018-06-23 MED ORDER — SODIUM CHLORIDE 0.9 % IV SOLN: 250.0000 mL | INTRAVENOUS | Status: DC | PRN

## 2018-06-23 MED ORDER — ONDANSETRON HCL 4 MG/2ML IJ SOLN: 4.0000 mg | Freq: Four times a day (QID) | INTRAMUSCULAR | Status: DC | PRN

## 2018-06-23 MED ORDER — SODIUM CHLORIDE 0.9 % IV SOLN
100.0000 mg | Freq: Two times a day (BID) | INTRAVENOUS | Status: DC
  Administered 2018-06-24 – 2018-06-25 (×3): 100 mg via INTRAVENOUS
  Filled 2018-06-23 (×4): qty 100

## 2018-06-23 MED ORDER — ESTRADIOL 0.1 MG/GM VA CREA
1.0000 | TOPICAL_CREAM | VAGINAL | Status: DC
  Administered 2018-06-25 – 2018-06-27 (×2): 1 via VAGINAL
  Filled 2018-06-23: qty 42.5

## 2018-06-23 MED ORDER — IOHEXOL 300 MG/ML  SOLN
100.0000 mL | Freq: Once | INTRAMUSCULAR | Status: AC | PRN
  Administered 2018-06-23: 100 mL via INTRAVENOUS

## 2018-06-23 MED ORDER — ENSURE ENLIVE PO LIQD
237.0000 mL | Freq: Two times a day (BID) | ORAL | Status: DC
  Administered 2018-06-24 – 2018-06-26 (×4): 237 mL via ORAL

## 2018-06-23 MED ORDER — FLEET ENEMA 7-19 GM/118ML RE ENEM
1.0000 | ENEMA | Freq: Once | RECTAL | Status: AC
  Administered 2018-06-23: 1 via RECTAL
  Filled 2018-06-23: qty 1

## 2018-06-23 MED ORDER — SERTRALINE HCL 50 MG PO TABS
50.0000 mg | ORAL_TABLET | Freq: Every day | ORAL | Status: DC
  Administered 2018-06-24 – 2018-06-27 (×4): 50 mg via ORAL
  Filled 2018-06-23 (×4): qty 1

## 2018-06-23 MED ORDER — SODIUM CHLORIDE (PF) 0.9 % IJ SOLN
INTRAMUSCULAR | Status: AC
  Filled 2018-06-23: qty 50

## 2018-06-23 MED ORDER — ONDANSETRON HCL 4 MG PO TABS: 4.0000 mg | ORAL_TABLET | Freq: Four times a day (QID) | ORAL | Status: DC | PRN

## 2018-06-23 MED ORDER — AMIODARONE HCL 200 MG PO TABS
200.0000 mg | ORAL_TABLET | Freq: Every day | ORAL | Status: DC
  Administered 2018-06-24 – 2018-06-27 (×4): 200 mg via ORAL
  Filled 2018-06-23 (×4): qty 1

## 2018-06-23 MED ORDER — FUROSEMIDE 10 MG/ML IJ SOLN
20.0000 mg | Freq: Every day | INTRAMUSCULAR | Status: DC
  Administered 2018-06-24 – 2018-06-25 (×2): 20 mg via INTRAVENOUS
  Filled 2018-06-23 (×2): qty 2

## 2018-06-23 NOTE — ED Provider Notes (Signed)
[facility] DEPT Provider Note   CSN: [id number] Arrival date & time: 06/23/18  1148    History   Chief Complaint Chief Complaint  Patient presents with  . Abdominal Pain  . Shortness of Breath    HPI Jo [name] is a 85 y.o. female.     85 year old female who presents with 2 days of abdominal distention with increasing shortness of breath.  Denies any emesis or diarrhea.  No fever or chills.  States that her last bowel movement was 2 days ago.  No reported fever but some slight cough.  Notes some nasal congestion.  Denies any urinary symptoms.  Abdominal discomfort characterized as crampy and persistent.  No chest pain or chest pressure.  Called EMS was transported here.  No treatment use prior to arrival.     Past Medical History:  Diagnosis Date  . Anxiety   . Arthritis    "hands; between shoulders; back; feet"  . Chronic anticoagulation    -->Eliquis  . Chronic diastolic CHF (congestive heart failure) ([place])    a. 06/2011 Echo: EF 60-65%.  . Depression   . Essential hypertension   . High risk medication use    on Flecainide  . Kidney stone    passed in [place] 2014  . Moderate aortic insufficiency    a. 06/2011 Echo: EF 60-65%, mild LVH, no rwma, mod AI.  Marland Kitchen Osteoporosis   . Osteoporosis   . PAF (paroxysmal atrial fibrillation) (HCC)    a. chronic flecainide->reduced to 25 bid 08/2014.  Marland Kitchen Panic attacks    "since losing husband 1994"  . Skin cancer ?1960's   "between breasts"  . SSS (sick sinus syndrome) (HCC)    a. s/p pacemaker. b. s/p Medtronic gen change 07/2011; c.   . Type II diabetes mellitus ([place])    "controlled w/diet"    Patient Active Problem List   Diagnosis Date Noted  . Atrial fibrillation with rapid ventricular response ([place]) 06/04/2017  . UTI (urinary tract infection) 08/30/2016  . Weakness 08/29/2016  . Acute lower UTI 08/29/2016  . Abdominal distention   . CAP (community acquired pneumonia) 06/27/2015  . Sepsis  ([place]) 06/27/2015  . Pacemaker 06/27/2015  . Moderate aortic insufficiency   . Chronic diastolic CHF (congestive heart failure) ([place])   . Breast cancer ([place]) 02/03/2014  . Dyslipidemia 08/15/2012  . Atrophic vaginitis 08/15/2012  . Constipation 08/15/2012  . Vitamin D intoxication 08/15/2012  . Acute on chronic diastolic HF (heart failure) ([place]) 07/06/2012  . Atrial fibrillation with RVR ([place]) 07/21/2011  . Diabetes mellitus, type II ([place])   . Panic attacks   . Anxiety   . PAF (paroxysmal atrial fibrillation) ([place])   . SSS (sick sinus syndrome) ([place])   . History of aortic insufficiency   . Hypertension     Past Surgical History:  Procedure Laterality Date  . APPENDECTOMY  1955  . BREAST CYST EXCISION  ~ 1950   right  . BREAST LUMPECTOMY WITH NEEDLE LOCALIZATION Bilateral 02/03/2014   Procedure: BILATERAL BREAST LUMPECTOMY WITH NEEDLE LOCALIZATION ON LEFT;  Surgeon: Excell Seltzer, MD;  Location: [facility];  Service: General;  Laterality: Bilateral;  . CARDIOVASCULAR STRESS TEST  12/09/2004  . CATARACT EXTRACTION W/ INTRAOCULAR LENS  IMPLANT, BILATERAL  1990's  . [place]  . DILATION AND CURETTAGE OF UTERUS  1953  . INSERT / REPLACE / REMOVE PACEMAKER  03/16/2006  . PERMANENT PACEMAKER GENERATOR CHANGE  08/03/2011   Procedure: PERMANENT  PACEMAKER GENERATOR CHANGE;  Surgeon: Thompson Grayer, MD;  Location: [facility] CATH LAB;  Service: Cardiovascular;;  . SKIN CANCER EXCISION  ?1960's   "between my breasts"  . TONSILLECTOMY     "school age"     OB History   No obstetric history on file.      Home Medications    Prior to Admission medications   Medication Sig Start Date End Date Taking? Authorizing Provider  acetaminophen (TYLENOL) 500 MG tablet Take 500-1,000 mg by mouth every 6 (six) hours as needed for mild pain, moderate pain, fever or headache.    [provider]  amiodarone (PACERONE) 200 MG tablet TAKE 1 TABLET BY MOUTH EVERY DAY 02/21/18   Sherran Needs, NP  apixaban (ELIQUIS) 2.5 MG TABS tablet Take 1 tablet (2.5 mg total) by mouth 2 (two) times daily. 01/28/14   [place], Peter M, MD  azelastine (ASTELIN) 0.1 % nasal spray Place 1 spray into both nostrils daily as needed for rhinitis. Use in each nostril as directed    [provider]  carvedilol (COREG) 6.25 MG tablet Take 6.25 mg by mouth 2 (two) times daily with a meal. Hold for SBP<110 OR HR <60    [provider]  CRESTOR 5 MG tablet Take 2.5 mg by mouth every Monday, Wednesday, and Friday.  07/25/14   [provider]  diltiazem (CARDIZEM CD) 240 MG 24 hr capsule Take 240 mg by mouth daily.  09/16/14   [provider]  estradiol (ESTRACE) 0.1 MG/GM vaginal cream Place 1 Applicatorful vaginally every Monday, Wednesday, and Friday.    [provider]  feeding supplement, ENSURE ENLIVE, (ENSURE ENLIVE) LIQD Take 237 mLs by mouth 2 (two) times daily between meals. 06/07/17   Ghimire, Henreitta Leber, MD  Flaxseed, Linseed, (FLAX SEEDS PO) Take 2.5 mLs by mouth daily with breakfast.    [provider]  furosemide (LASIX) 20 MG tablet Take 20 mg by mouth.    [provider]  JANUVIA 100 MG tablet Take 100 mg by mouth daily. 08/12/17   [provider]  LORazepam (ATIVAN) 0.5 MG tablet Take 0.5-1 tablets (0.25-0.5 mg total) by mouth See admin instructions. Take 0.25 mg tablet in the morning and afternoon, then 0.5 mg at bedtime 06/07/17   Ghimire, Henreitta Leber, MD  polyethylene glycol (MIRALAX / GLYCOLAX) packet Take 17 g by mouth daily as needed for mild constipation.     [provider]  sertraline (ZOLOFT) 50 MG tablet Take 50 mg by mouth daily. 07/20/14   [provider]  Vitamin D, Ergocalciferol, (DRISDOL) 50000 UNITS CAPS capsule Take 50,000 Units by mouth every Wednesday.    [provider]    Family History Family History  Problem Relation Age of Onset  . Emphysema Father     Social History Social  History   Tobacco Use  . Smoking status: Never Smoker  . Smokeless tobacco: Never Used  Substance Use Topics  . Alcohol use: No  . Drug use: No     Allergies   Sulfonamide derivatives and Citalopram hydrobromide   Review of Systems Review of Systems  All other systems reviewed and are negative.    Physical Exam Updated Vital Signs BP (!) 162/90   Pulse 64   Temp 98.2 F (36.8 C)   Resp 18   SpO2 95%   Physical Exam Vitals signs and nursing note reviewed.  Constitutional:      General: She is not in acute distress.  Appearance: Normal appearance. She is well-developed. She is not toxic-appearing.  HENT:     Head: Normocephalic and atraumatic.  Eyes:     General: Lids are normal.     Conjunctiva/sclera: Conjunctivae normal.     Pupils: Pupils are equal, round, and reactive to light.  Neck:     Musculoskeletal: Normal range of motion and neck supple.     Thyroid: No thyroid mass.     Trachea: No tracheal deviation.  Cardiovascular:     Rate and Rhythm: Normal rate and regular rhythm.     Heart sounds: Normal heart sounds. No murmur. No gallop.   Pulmonary:     Effort: Tachypnea present. No accessory muscle usage or respiratory distress.     Breath sounds: No stridor. Examination of the right-upper field reveals decreased breath sounds. Examination of the left-upper field reveals decreased breath sounds. Decreased breath sounds present. No wheezing, rhonchi or rales.  Abdominal:     General: Bowel sounds are decreased. There is distension.     Palpations: Abdomen is soft.     Tenderness: There is generalized abdominal tenderness. There is no guarding or rebound.  Musculoskeletal: Normal range of motion.        General: No tenderness.  Skin:    General: Skin is warm and dry.     Findings: No abrasion or rash.  Neurological:     Mental Status: She is alert and oriented to person, place, and time.     GCS: GCS eye subscore is 4. GCS verbal subscore is 5. GCS  motor subscore is 6.     Cranial Nerves: No cranial nerve deficit.     Sensory: No sensory deficit.  Psychiatric:        Attention and Perception: Attention normal.      ED Treatments / Results  Labs (all labs ordered are listed, but only abnormal results are displayed) Labs Reviewed  URINE CULTURE  SARS CORONAVIRUS 2 ([facility])  CBC WITH DIFFERENTIAL/PLATELET  COMPREHENSIVE METABOLIC PANEL  LIPASE, BLOOD  URINALYSIS, ROUTINE W REFLEX MICROSCOPIC  BRAIN NATRIURETIC PEPTIDE  LACTIC ACID, PLASMA  I-STAT TROPONIN, ED    EKG EKG Interpretation  Date/Time:  Saturday Aguirre 30 2020 14:33:11 EDT Ventricular Rate:  60 PR Interval:    QRS Duration: 102 QT Interval:  467 QTC Calculation: 467 R Axis:   28 Text Interpretation:  Age not entered, assumed to be  85 years old for purpose of ECG interpretation Atrial-paced rhythm Borderline T abnormalities, anterior leads No significant change since last tracing Confirmed by Lacretia Leigh ([id number]) on 06/23/2018 2:37:08 PM   Radiology No results found.  Procedures Procedures (including critical care time)  Medications Ordered in ED Medications  0.9 %  sodium chloride infusion (has no administration in time range)     Initial Impression / Assessment and Plan / ED Course  I have reviewed the triage vital signs and the nursing notes.  Pertinent labs & imaging results that were available during my care of the patient were reviewed by me and considered in my medical decision making (see chart for details).        Patient's chest x-ray consistent with possible CHF versus pneumonia.  BNP slightly elevated and patient treated with Lasix.  Patient also placed on IV antibiotics.  Mild leukocytosis noted on patient's CBC.  COVID test negative.  Patient does not normally require oxygen and is currently on 2 L.  Abdominal CT without evidence of  obstruction only for constipation.  Will order enema.   Will consult hospitalist for admission   Jo [name] was evaluated in Emergency Department on 06/23/2018 for the symptoms described in the history of present illness. She was evaluated in the context of the global COVID-19 pandemic, which necessitated consideration that the patient might be at risk for infection with the SARS-CoV-2 virus that causes COVID-19. Institutional protocols and algorithms that pertain to the evaluation of patients at risk for COVID-19 are in a state of rapid change based on information released by regulatory bodies including the CDC and federal and state organizations. These policies and algorithms were followed during the patient's care in the ED.   Final Clinical Impressions(s) / ED Diagnoses   Final diagnoses:  None    ED Discharge Orders    None       Lacretia Leigh, MD 06/23/18 [phone]

## 2018-06-23 NOTE — ED Notes (Signed)
Patient transported to CT 

## 2018-06-23 NOTE — ED Notes (Signed)
Hospitalist at bedside 

## 2018-06-23 NOTE — ED Triage Notes (Signed)
Per EMS, patient from home, c/o abdominal pain, last BM x2 days ago. Took miralax with no relief. C/o chronic back pain.  A&Ox4.  BP 198/94 Patient ambulatory with walker.  85% on EMS arrival. 94% on 2L. Denies cough and SOB.  Currently being treated for UTI. Taking meds as prescribed.

## 2018-06-23 NOTE — ED Notes (Signed)
XR at bedside

## 2018-06-23 NOTE — ED Notes (Signed)
Redressed IV site. Bleeding noted under tegaderm from insertion site. No swelling noted. IV flushed and remains patent. Patient denies pain at time of flushing or pain to surrounding arm.

## 2018-06-23 NOTE — ED Notes (Signed)
ED TO INPATIENT HANDOFF REPORT  Name/Age/Gender Jo [name] 85 y.o. female  Code Status Code Status History    Date Active Date Inactive Code Status Order ID Comments User Context   06/04/2017 1956 06/07/2017 1659 DNR [id number]  Cristy Folks, MD Inpatient   08/29/2016 1806 08/31/2016 1652 DNR [id number]  Rise Patience, MD ED   06/27/2015 2318 07/01/2015 2207 Full Code [id number]  Lily Kocher, MD Inpatient   02/03/2014 1803 02/04/2014 1730 Full Code [id number]  Excell Seltzer, MD Inpatient   08/03/2011 1004 08/05/2011 0310 Full Code [id number]  Chales Salmon, RN Inpatient   07/21/2011 0451 07/23/2011 2041 Full Code [id number]  Elgergawy, Emeline Gins, MD ED    Questions for Most Recent Historical Code Status (Order [id number])    Question Answer Comment   In the event of cardiac or respiratory ARREST Do not call a "code blue"    In the event of cardiac or respiratory ARREST Do not perform Intubation, CPR, defibrillation or ACLS    In the event of cardiac or respiratory ARREST Use medication by any route, position, wound care, and other measures to relive pain and suffering. Raso use oxygen, suction and manual treatment of airway obstruction as needed for comfort.       Home/SNF/Other Home  Chief Complaint Abd pain  Level of Care/Admitting Diagnosis ED Disposition    ED Disposition Condition Comment   Admit  Hospital Area: [facility] [[id number]]  Level of Care: Telemetry [5]  Admit to tele based on following criteria: Acute CHF  Covid Evaluation: Confirmed COVID Negative  Diagnosis: Acute on chronic diastolic heart failure ([place]) [428.33.ICD-9-CM]  Admitting Physician: Mariel Aloe [[id number]]  Attending Physician: Mariel Aloe [phone]  PT Class (Do Not Modify): Observation [104]  PT Acc Code (Do Not Modify): Observation [10022]       Medical History Past Medical History:  Diagnosis Date  . Anxiety   . Arthritis    "hands; between shoulders; back; feet"  .  Chronic anticoagulation    -->Eliquis  . Chronic diastolic CHF (congestive heart failure) ([place])    a. 06/2011 Echo: EF 60-65%.  . Depression   . Essential hypertension   . High risk medication use    on Flecainide  . Kidney stone    passed in [place] 2014  . Moderate aortic insufficiency    a. 06/2011 Echo: EF 60-65%, mild LVH, no rwma, mod AI.  Marland Kitchen Osteoporosis   . Osteoporosis   . PAF (paroxysmal atrial fibrillation) ([facility])    a. chronic flecainide->reduced to 25 bid 08/2014.  Marland Kitchen Panic attacks    "since losing husband 1994"  . Skin cancer ?1960's   "between breasts"  . SSS (sick sinus syndrome) ([facility])    a. s/p pacemaker. b. s/p Medtronic gen change 07/2011; c.   . Type II diabetes mellitus ([place])    "controlled w/diet"    Allergies Allergies  Allergen Reactions  . Sulfonamide Derivatives Rash    "it was all over my legs"  . Citalopram Hydrobromide Other (See Comments)    Pt does not remember reaction    IV Location/Drains/Wounds Patient Lines/Drains/Airways Status   Active Line/Drains/Airways    Name:   Placement date:   Placement time:   Site:   Days:   Peripheral IV 06/04/17 Anterior;Right Forearm   06/04/17    2000    Forearm   384   Peripheral IV 06/23/18 Left Arm   06/23/18    1239  Arm   less than 1   External Urinary Catheter   06/04/17    1646    -   384   Incision (Closed) 02/03/14 Breast Bilateral   02/03/14    1603     1601          Labs/Imaging Results for orders placed or performed during the hospital encounter of 06/23/18 (from the past 48 hour(s))  CBC with Differential/Platelet     Status: Abnormal   Collection Time: 06/23/18 12:44 PM  Result Value Ref Range   WBC 14.2 (H) 4.0 - 10.5 K/uL   RBC 3.79 (L) 3.87 - 5.11 MIL/uL   Hemoglobin 11.8 (L) 12.0 - 15.0 g/dL   HCT 36.2 36.0 - 46.0 %   MCV 95.5 80.0 - 100.0 fL   MCH 31.1 26.0 - 34.0 pg   MCHC 32.6 30.0 - 36.0 g/dL   RDW 14.6 11.5 - 15.5 %   Platelets 222 150 - 400 K/uL   nRBC 0.0 0.0 - 0.2 %    Neutrophils Relative % 80 %   Neutro Abs 11.2 (H) 1.7 - 7.7 K/uL   Lymphocytes Relative 11 %   Lymphs Abs 1.6 0.7 - 4.0 K/uL   Monocytes Relative 8 %   Monocytes Absolute 1.2 (H) 0.1 - 1.0 K/uL   Eosinophils Relative 0 %   Eosinophils Absolute 0.1 0.0 - 0.5 K/uL   Basophils Relative 0 %   Basophils Absolute 0.0 0.0 - 0.1 K/uL   Immature Granulocytes 1 %   Abs Immature Granulocytes 0.20 (H) 0.00 - 0.07 K/uL    Comment: Performed at [facility], [place] [address] [place], [place] [address]  Comprehensive metabolic panel     Status: Abnormal   Collection Time: 06/23/18 12:44 PM  Result Value Ref Range   Sodium 132 (L) 135 - 145 mmol/L   Potassium 3.7 3.5 - 5.1 mmol/L   Chloride 99 98 - 111 mmol/L   CO2 25 22 - 32 mmol/L   Glucose, Bld 145 (H) 70 - 99 mg/dL   BUN 10 8 - 23 mg/dL   Creatinine, Ser 0.66 0.44 - 1.00 mg/dL   Calcium 8.4 (L) 8.9 - 10.3 mg/dL   Total Protein 6.4 (L) 6.5 - 8.1 g/dL   Albumin 3.4 (L) 3.5 - 5.0 g/dL   AST 20 15 - 41 U/L   ALT 19 0 - 44 U/L   Alkaline Phosphatase 79 38 - 126 U/L   Total Bilirubin 0.6 0.3 - 1.2 mg/dL   GFR calc non Af Amer >60 >60 mL/min   GFR calc Af Amer >60 >60 mL/min   Anion gap 8 5 - 15    Comment: Performed at [facility], [place] [address], [place], [place] [address]  Lipase, blood     Status: None   Collection Time: 06/23/18 12:44 PM  Result Value Ref Range   Lipase 23 11 - 51 U/L    Comment: Performed at [facility], [place] [address], [place],  [address]  Urinalysis, Routine w reflex microscopic     Status: Abnormal   Collection Time: 06/23/18 12:44 PM  Result Value Ref Range   Color, Urine COLORLESS (A) YELLOW   APPearance CLEAR CLEAR   Specific Gravity, Urine 1.003 (L) 1.005 - 1.030   pH 7.0 5.0 - 8.0   Glucose, UA NEGATIVE NEGATIVE mg/dL   Hgb urine dipstick NEGATIVE NEGATIVE   Bilirubin Urine NEGATIVE NEGATIVE   Ketones, ur  NEGATIVE NEGATIVE mg/dL   Protein,  ur NEGATIVE NEGATIVE mg/dL   Nitrite NEGATIVE NEGATIVE   Leukocytes,Ua NEGATIVE NEGATIVE    Comment: Performed at [place] [address] [place], [place] [address]  Brain natriuretic peptide     Status: Abnormal   Collection Time: 06/23/18 12:44 PM  Result Value Ref Range   B Natriuretic Peptide 343.6 (H) 0.0 - 100.0 pg/mL    Comment: Performed at [facility], [place] [address], [place], [place] [address]  SARS Coronavirus 2 (CEPHEID- Performed in [facility] hospital lab), Hosp Order     Status: None   Collection Time: 06/23/18 12:44 PM  Result Value Ref Range   SARS Coronavirus 2 NEGATIVE NEGATIVE    Comment: (NOTE) If result is NEGATIVE SARS-CoV-2 target nucleic acids are NOT DETECTED. The SARS-CoV-2 RNA is generally detectable in upper and lower  respiratory specimens during the acute phase of infection. The lowest  concentration of SARS-CoV-2 viral copies this assay can detect is 250  copies / mL. A negative result does not preclude SARS-CoV-2 infection  and should not be used as the sole basis for treatment or other  patient management decisions.  A negative result Crymes occur with  improper specimen collection / handling, submission of specimen other  than nasopharyngeal swab, presence of viral mutation(s) within the  areas targeted by this assay, and inadequate number of viral copies  (<250 copies / mL). A negative result must be combined with clinical  observations, patient history, and epidemiological information. If result is POSITIVE SARS-CoV-2 target nucleic acids are DETECTED. The SARS-CoV-2 RNA is generally detectable in upper and lower  respiratory specimens dur ing the acute phase of infection.  Positive  results are indicative of active infection with SARS-CoV-2.  Clinical  correlation with patient history and other diagnostic information is  necessary to determine patient infection status.  Positive results do  not rule  out bacterial infection or co-infection with other viruses. If result is PRESUMPTIVE POSTIVE SARS-CoV-2 nucleic acids [place] BE PRESENT.   A presumptive positive result was obtained on the submitted specimen  and confirmed on repeat testing.  While 2019 novel coronavirus  (SARS-CoV-2) nucleic acids Pulcini be present in the submitted sample  additional confirmatory testing Mathia be necessary for epidemiological  and / or clinical management purposes  to differentiate between  SARS-CoV-2 and other Sarbecovirus currently known to infect humans.  If clinically indicated additional testing with an alternate test  methodology [phone]) is advised. The SARS-CoV-2 RNA is generally  detectable in upper and lower respiratory sp ecimens during the acute  phase of infection. The expected result is Negative. Fact Sheet for Patients:  StrictlyIdeas.no Fact Sheet for Healthcare Providers: BankingDealers.co.za This test is not yet approved or cleared by the [place] FDA and has been authorized for detection and/or diagnosis of SARS-CoV-2 by FDA under an Emergency Use Authorization (EUA).  This EUA will remain in effect (meaning this test can be used) for the duration of the COVID-19 declaration under Section 564(b)(1) of the Act, 21 U.S.C. section 360bbb-3(b)(1), unless the authorization is terminated or revoked sooner. Performed at [facility], [place] [address], [place], [place] [address]   Lactic acid, plasma     Status: None   Collection Time: 06/23/18 12:44 PM  Result Value Ref Range   Lactic Acid, Venous 1.0 0.5 - 1.9 mmol/L    Comment: Performed at [facility], [place] [address] [place], [place] [address]  Procalcitonin - Baseline     Status: None   Collection Time: 06/23/18 12:44 PM  Result Value Ref Range   Procalcitonin <0.10 ng/mL    Comment:        Interpretation: PCT (Procalcitonin) <= 0.5  ng/mL: Systemic infection (sepsis) is not likely. Local bacterial infection is possible. (NOTE)       Sepsis PCT Algorithm           Lower Respiratory Tract                                      Infection PCT Algorithm    ----------------------------     ----------------------------         PCT < 0.25 ng/mL                PCT < 0.10 ng/mL         Strongly encourage             Strongly discourage   discontinuation of antibiotics    initiation of antibiotics    ----------------------------     -----------------------------       PCT 0.25 - 0.50 ng/mL            PCT 0.10 - 0.25 ng/mL               OR       >80% decrease in PCT            Discourage initiation of                                            antibiotics      Encourage discontinuation           of antibiotics    ----------------------------     -----------------------------         PCT >= 0.50 ng/mL              PCT 0.26 - 0.50 ng/mL               AND        <80% decrease in PCT             Encourage initiation of                                             antibiotics       Encourage continuation           of antibiotics    ----------------------------     -----------------------------        PCT >= 0.50 ng/mL                  PCT > 0.50 ng/mL               AND         increase in PCT                  Strongly encourage                                      initiation  of antibiotics    Strongly encourage escalation           of antibiotics                                     -----------------------------                                           PCT <= 0.25 ng/mL                                                 OR                                        > 80% decrease in PCT                                     Discontinue / Do not initiate                                             antibiotics Performed at [facility] [address] [place], [place] [address]   I-stat troponin, ED     Status:  None   Collection Time: 06/23/18 12:58 PM  Result Value Ref Range   Troponin i, poc 0.01 0.00 - 0.08 ng/mL   Comment 3            Comment: Due to the release kinetics of cTnI, a negative result within the first hours of the onset of symptoms does not rule out myocardial infarction with certainty. If myocardial infarction is still suspected, repeat the test at appropriate intervals.    Dg Abdomen 1 View  Result Date: 06/23/2018 CLINICAL DATA:  Abdominal pain. EXAM: ABDOMEN - 1 VIEW COMPARISON:  June 27, 2015 FINDINGS: A small stool ball is seen in the rectum measuring 6.7 by 4.4 cm. Bowel gas pattern is otherwise normal. No bowel obstruction. No free air, portal venous gas, or pneumatosis. IMPRESSION: Small stool ball in the rectum.  No other abnormalities. Electronically Signed   By: Dorise Bullion III M.D   On: 06/23/2018 13:30   Ct Abdomen Pelvis W Contrast  Result Date: 06/23/2018 CLINICAL DATA:  Diffuse abdominal pain.  Abdominal distention EXAM: CT ABDOMEN AND PELVIS WITH CONTRAST TECHNIQUE: Multidetector CT imaging of the abdomen and pelvis was performed using the standard protocol following bolus administration of intravenous contrast. CONTRAST:  167mL OMNIPAQUE IOHEXOL 300 MG/ML  SOLN COMPARISON:  07/06/2012 FINDINGS: Lower chest: Small bilateral pleural effusions. Mild cardiomegaly. Bibasilar atelectasis. Hepatobiliary: Hypodense lesion noted within the left hepatic lobe measures 17 mm with peripheral contrast puddling, most compatible with hemangioma. Gallbladder is distended. No biliary ductal dilatation. Pancreas: No pancreatic ductal dilatation. 10 mm low-density cystic area anteriorly within the pancreatic head, new since prior study. Spleen: No focal abnormality.  Normal size. Adrenals/Urinary Tract: 5.5 cm fat attenuating mass in  the right adrenal gland most compatible with myelolipoma mild fullness of the renal collecting systems bilaterally, likely mild hydronephrosis. Urinary  bladder is moderately distended. No obstructing stones. Cortical thinning within the left kidney. Left adrenal mass measures up to 3.1 cm and is stable. Stomach/Bowel: Moderate stool throughout the colon. Stomach and small bowel decompressed. No evidence of obstruction. Vascular/Lymphatic: Aortic atherosclerosis. No enlarged abdominal or pelvic lymph nodes. Reproductive: Prior hysterectomy.  No adnexal masses. Other: No free fluid or free air. Musculoskeletal: No acute bony abnormality. IMPRESSION: Small bilateral pleural effusions.  Bibasilar atelectasis. Cardiomegaly. 17 mm hypodense lesion in the left hepatic lobe along the falciform ligament with peripheral puddling of contrast most compatible with hemangioma. Bilateral adrenal masses, indeterminate on the left but stable in size. Right adrenal masses most compatible with myelolipoma. Mild bilateral hydronephrosis without obstructing stone. Moderate distention of the urinary bladder. Moderate stool burden in the colon. Aortic atherosclerosis. Electronically Signed   By: Rolm Baptise M.D.   On: 06/23/2018 15:53   Dg Chest Port 1 View  Result Date: 06/23/2018 CLINICAL DATA:  Abdominal pain.  Low oxygen saturation. EXAM: PORTABLE CHEST 1 VIEW COMPARISON:  06/04/2017 FINDINGS: Left subclavian double lead pacemaker device and leads are stable. Upper normal heart size. Pulmonary vascularity is indistinct. Interstitial infiltrates throughout both lungs are stable. Patchy airspace opacities at the left base. IMPRESSION: The above findings suggest interstitial edema and patchy airspace disease at the left base. An inflammatory process is not excluded. Electronically Signed   By: Marybelle Killings M.D.   On: 06/23/2018 13:29    Pending Labs Unresulted Labs (From admission, onward)    Start     Ordered   06/24/18 0500  Procalcitonin  Daily,   R     06/23/18 1617   06/23/18 1223  Urine Culture  ONCE - STAT,   STAT     06/23/18 1224   Signed and Held  Basic  metabolic panel  Tomorrow morning,   R     Signed and Held   Signed and Held  CBC  Tomorrow morning,   R     Signed and Held          Vitals/Pain Today's Vitals   06/23/18 1723 06/23/18 1730 06/23/18 1800 06/23/18 1830  BP: (!) 164/59 (!) 157/52 (!) 154/50 (!) 168/77  Pulse: 64 65 66 86  Resp: (!) 21 (!) 21 20 16   Temp:      SpO2: 97% 97% 97% 95%  PainSc:        Isolation Precautions No active isolations  Medications Medications  0.9 %  sodium chloride infusion ( Intravenous New Bag/Given (Non-Interop) 06/23/18 1246)  cefTRIAXone (ROCEPHIN) 2 g in sodium chloride 0.9 % 100 mL IVPB (0 g Intravenous Stopped 06/23/18 1839)  azithromycin (ZITHROMAX) 500 mg in sodium chloride 0.9 % 250 mL IVPB (500 mg Intravenous New Bag/Given (Non-Interop) 06/23/18 1810)  sodium chloride (PF) 0.9 % injection (has no administration in time range)  furosemide (LASIX) injection 20 mg (20 mg Intravenous Given 06/23/18 1600)  iohexol (OMNIPAQUE) 300 MG/ML solution 100 mL (100 mLs Intravenous Contrast Given 06/23/18 1529)  sodium phosphate (FLEET) 7-19 GM/118ML enema 1 enema (1 enema Rectal Given 06/23/18 1709)    Mobility walks with device

## 2018-06-23 NOTE — H&P (Addendum)
History and Physical    Averly Ericson Hogen UKG:[id number] DOB: [birth year] DOA: 06/23/2018  PCP: Crist Infante, MD Patient coming from: Home  Chief Complaint: shortness of breath and fluid on the belly  HPI: Quetzalli Clos Ferraz is a 85 y.o. female with medical history significant of anxiety, chronic diastolic heart failure, depression, hypertension, constipation, SSS s/p pacemaker, atrial fibrillation, diabetes mellitus.  Patient presented secondary to 2-week history of shortness of breath and fluid around her belly. She reports not using anything to help with her shortness of breath but that Miralax was not helping with her constipation. She reports that her symptoms have not worsened but have stayed persistent, prompting her to seek medical help.  ED Course: Vitals: Afebrile, pulse of 60, RR of 22, BP of 178/76, SpO2 of 96% on 2 L via [place] Labs: Sodium of 132, BNP of 343, WBC of 14.2k Imaging: CT abdomen/pelvis significant for hepatic lesion compatible with hemangioma, bilateral adrenal masses compatible with myelolipoma, moderate stool burden. Chest x-ray significant for interstitial edema and patchy LLL airspace disease Medications/Course: Lasix 20 mg IV, Fleet enema, Ceftriaxone/azithromycin  Review of Systems: Review of Systems  Constitutional: Negative for chills and fever.  Respiratory: Positive for shortness of breath. Negative for cough and wheezing.   Cardiovascular: Positive for leg swelling. Negative for chest pain.  Gastrointestinal: Positive for abdominal pain and constipation. Negative for diarrhea, nausea and vomiting.  All other systems reviewed and are negative.   Past Medical History:  Diagnosis Date   Anxiety    Arthritis    "hands; between shoulders; back; feet"   Chronic anticoagulation    -->Eliquis   Chronic diastolic CHF (congestive heart failure) ([place])    a. 06/2011 Echo: EF 60-65%.   Depression    Essential hypertension    High risk medication use    on Flecainide    Kidney stone    passed in [place] 2014   Moderate aortic insufficiency    a. 06/2011 Echo: EF 60-65%, mild LVH, no rwma, mod AI.   Osteoporosis    Osteoporosis    PAF (paroxysmal atrial fibrillation) (HCC)    a. chronic flecainide->reduced to 25 bid 08/2014.   Panic attacks    "since losing husband 1994"   Skin cancer ?1960's   "between breasts"   SSS (sick sinus syndrome) (HCC)    a. s/p pacemaker. b. s/p Medtronic gen change 07/2011; c.    Type II diabetes mellitus ([place])    "controlled w/diet"    Past Surgical History:  Procedure Laterality Date   APPENDECTOMY  1955   BREAST CYST EXCISION  ~ 1950   right   BREAST LUMPECTOMY WITH NEEDLE LOCALIZATION Bilateral 02/03/2014   Procedure: BILATERAL BREAST LUMPECTOMY WITH NEEDLE LOCALIZATION ON LEFT;  Surgeon: Excell Seltzer, MD;  Location: [facility];  Service: General;  Laterality: Bilateral;   CARDIOVASCULAR STRESS TEST  12/09/2004   CATARACT EXTRACTION W/ INTRAOCULAR LENS  IMPLANT, BILATERAL  1990's   CESAREAN SECTION  1955   DILATION AND CURETTAGE OF UTERUS  1953   INSERT / REPLACE / REMOVE PACEMAKER  03/16/2006   PERMANENT PACEMAKER GENERATOR CHANGE  08/03/2011   Procedure: PERMANENT PACEMAKER GENERATOR CHANGE;  Surgeon: Thompson Grayer, MD;  Location: [facility] C[facility];  Service: Cardiovascular;;   SKIN CANCER EXCISION  ?1960's   "between my breasts"   TONSILLECTOMY     "school age"     reports that she has never smoked. She has never used smokeless tobacco. She reports that she does  not drink alcohol or use drugs.  Allergies  Allergen Reactions   Sulfonamide Derivatives Rash    "it was all over my legs"   Citalopram Hydrobromide Other (See Comments)    Pt does not remember reaction    Family History  Problem Relation Age of Onset   Emphysema Father     Prior to Admission medications   Medication Sig Start Date End Date Taking? Authorizing Provider  acetaminophen (TYLENOL) 500 MG tablet Take 500-1,000 mg  by mouth every 6 (six) hours as needed for mild pain, moderate pain, fever or headache.    [provider]  amiodarone (PACERONE) 200 MG tablet TAKE 1 TABLET BY MOUTH EVERY DAY 02/21/18   Sherran Needs, NP  apixaban (ELIQUIS) 2.5 MG TABS tablet Take 1 tablet (2.5 mg total) by mouth 2 (two) times daily. 01/28/14   [place], Peter M, MD  azelastine (ASTELIN) 0.1 % nasal spray Place 1 spray into both nostrils daily as needed for rhinitis. Use in each nostril as directed    [provider]  carvedilol (COREG) 6.25 MG tablet Take 6.25 mg by mouth 2 (two) times daily with a meal. Hold for SBP<110 OR HR <60    [provider]  CRESTOR 5 MG tablet Take 2.5 mg by mouth every Monday, Wednesday, and Friday.  07/25/14   [provider]  diltiazem (CARDIZEM CD) 240 MG 24 hr capsule Take 240 mg by mouth daily.  09/16/14   [provider]  estradiol (ESTRACE) 0.1 MG/GM vaginal cream Place 1 Applicatorful vaginally every Monday, Wednesday, and Friday.    [provider]  feeding supplement, ENSURE ENLIVE, (ENSURE ENLIVE) LIQD Take 237 mLs by mouth 2 (two) times daily between meals. 06/07/17   Ghimire, Henreitta Leber, MD  Flaxseed, Linseed, (FLAX SEEDS PO) Take 2.5 mLs by mouth daily with breakfast.    [provider]  furosemide (LASIX) 20 MG tablet Take 20 mg by mouth.    [provider]  JANUVIA 100 MG tablet Take 100 mg by mouth daily. 08/12/17   [provider]  LORazepam (ATIVAN) 0.5 MG tablet Take 0.5-1 tablets (0.25-0.5 mg total) by mouth See admin instructions. Take 0.25 mg tablet in the morning and afternoon, then 0.5 mg at bedtime 06/07/17   Ghimire, Henreitta Leber, MD  polyethylene glycol (MIRALAX / GLYCOLAX) packet Take 17 g by mouth daily as needed for mild constipation.     [provider]  sertraline (ZOLOFT) 50 MG tablet Take 50 mg by mouth daily. 07/20/14   [provider]  Vitamin D, Ergocalciferol, (DRISDOL) 50000 UNITS  CAPS capsule Take 50,000 Units by mouth every Wednesday.    [provider]    Physical Exam:  Physical Exam Vitals signs reviewed.  Constitutional:      General: She is not in acute distress.    Appearance: She is well-developed. She is not ill-appearing, toxic-appearing or diaphoretic.  Eyes:     Conjunctiva/sclera: Conjunctivae normal.     Pupils: Pupils are equal, round, and reactive to light.  Neck:     Musculoskeletal: Normal range of motion.  Cardiovascular:     Rate and Rhythm: Normal rate and regular rhythm.     Heart sounds: Normal heart sounds. No murmur.  Pulmonary:     Effort: Pulmonary effort is normal. No respiratory distress.     Breath sounds: Examination of the right-lower field reveals rales. Decreased breath sounds and rales present. No wheezing.  Abdominal:  General: Bowel sounds are normal. There is distension.     Palpations: Abdomen is soft.     Tenderness: There is no abdominal tenderness. There is no guarding or rebound.  Musculoskeletal: Normal range of motion.        General: No tenderness.  Lymphadenopathy:     Cervical: No cervical adenopathy.  Skin:    General: Skin is warm and dry.     Findings: Bruising and ecchymosis present.  Neurological:     Mental Status: She is alert and oriented to person, place, and time.     Labs on Admission: I have personally reviewed following labs and imaging studies  CBC: Recent Labs  Lab 06/23/18 1244  WBC 14.2*  NEUTROABS 11.2*  HGB 11.8*  HCT 36.2  MCV 95.5  PLT 976    Basic Metabolic Panel: Recent Labs  Lab 06/23/18 1244  NA 132*  K 3.7  CL 99  CO2 25  GLUCOSE 145*  BUN 10  CREATININE 0.66  CALCIUM 8.4*    GFR: CrCl cannot be calculated (Unknown ideal weight.).  Liver Function Tests: Recent Labs  Lab 06/23/18 1244  AST 20  ALT 19  ALKPHOS 79  BILITOT 0.6  PROT 6.4*  ALBUMIN 3.4*   Recent Labs  Lab 06/23/18 1244  LIPASE 23   No results for input(s):  AMMONIA in the last 168 hours.  Coagulation Profile: No results for input(s): INR, PROTIME in the last 168 hours.  Cardiac Enzymes: No results for input(s): CKTOTAL, CKMB, CKMBINDEX, TROPONINI in the last 168 hours.  BNP (last 3 results) No results for input(s): PROBNP in the last 8760 hours.  HbA1C: No results for input(s): HGBA1C in the last 72 hours.  CBG: No results for input(s): GLUCAP in the last 168 hours.  Lipid Profile: No results for input(s): CHOL, HDL, LDLCALC, TRIG, CHOLHDL, LDLDIRECT in the last 72 hours.  Thyroid Function Tests: No results for input(s): TSH, T4TOTAL, FREET4, T3FREE, THYROIDAB in the last 72 hours.  Anemia Panel: No results for input(s): VITAMINB12, FOLATE, FERRITIN, TIBC, IRON, RETICCTPCT in the last 72 hours.  Urine analysis:    Component Value Date/Time   COLORURINE COLORLESS (A) 06/23/2018 1244   APPEARANCEUR CLEAR 06/23/2018 1244   LABSPEC 1.003 (L) 06/23/2018 1244   PHURINE 7.0 06/23/2018 [place] 06/23/2018 [place] 06/23/2018 [place] 06/23/2018 [place] 06/23/2018 1244   PROTEINUR NEGATIVE 06/23/2018 1244   UROBILINOGEN 1.0 07/06/2012 1130   NITRITE NEGATIVE 06/23/2018 [place] 06/23/2018 1244     Radiological Exams on Admission: Dg Abdomen 1 View  Result Date: 06/23/2018 CLINICAL DATA:  Abdominal pain. EXAM: ABDOMEN - 1 VIEW COMPARISON:  June 27, 2015 FINDINGS: A small stool ball is seen in the rectum measuring 6.7 by 4.4 cm. Bowel gas pattern is otherwise normal. No bowel obstruction. No free air, portal venous gas, or pneumatosis. IMPRESSION: Small stool ball in the rectum.  No other abnormalities. Electronically Signed   By: Dorise Bullion III M.D   On: 06/23/2018 13:30   Ct Abdomen Pelvis W Contrast  Result Date: 06/23/2018 CLINICAL DATA:  Diffuse abdominal pain.  Abdominal distention EXAM: CT ABDOMEN AND PELVIS WITH CONTRAST TECHNIQUE:  Multidetector CT imaging of the abdomen and pelvis was performed using the standard protocol following bolus administration of intravenous contrast. CONTRAST:  139mL OMNIPAQUE IOHEXOL 300 MG/ML  SOLN COMPARISON:  07/06/2012 FINDINGS: Lower chest: Small bilateral pleural effusions.  Mild cardiomegaly. Bibasilar atelectasis. Hepatobiliary: Hypodense lesion noted within the left hepatic lobe measures 17 mm with peripheral contrast puddling, most compatible with hemangioma. Gallbladder is distended. No biliary ductal dilatation. Pancreas: No pancreatic ductal dilatation. 10 mm low-density cystic area anteriorly within the pancreatic head, new since prior study. Spleen: No focal abnormality.  Normal size. Adrenals/Urinary Tract: 5.5 cm fat attenuating mass in the right adrenal gland most compatible with myelolipoma mild fullness of the renal collecting systems bilaterally, likely mild hydronephrosis. Urinary bladder is moderately distended. No obstructing stones. Cortical thinning within the left kidney. Left adrenal mass measures up to 3.1 cm and is stable. Stomach/Bowel: Moderate stool throughout the colon. Stomach and small bowel decompressed. No evidence of obstruction. Vascular/Lymphatic: Aortic atherosclerosis. No enlarged abdominal or pelvic lymph nodes. Reproductive: Prior hysterectomy.  No adnexal masses. Other: No free fluid or free air. Musculoskeletal: No acute bony abnormality. IMPRESSION: Small bilateral pleural effusions.  Bibasilar atelectasis. Cardiomegaly. 17 mm hypodense lesion in the left hepatic lobe along the falciform ligament with peripheral puddling of contrast most compatible with hemangioma. Bilateral adrenal masses, indeterminate on the left but stable in size. Right adrenal masses most compatible with myelolipoma. Mild bilateral hydronephrosis without obstructing stone. Moderate distention of the urinary bladder. Moderate stool burden in the colon. Aortic atherosclerosis. Electronically Signed    By: Rolm Baptise M.D.   On: 06/23/2018 15:53   Dg Chest Port 1 View  Result Date: 06/23/2018 CLINICAL DATA:  Abdominal pain.  Low oxygen saturation. EXAM: PORTABLE CHEST 1 VIEW COMPARISON:  06/04/2017 FINDINGS: Left subclavian double lead pacemaker device and leads are stable. Upper normal heart size. Pulmonary vascularity is indistinct. Interstitial infiltrates throughout both lungs are stable. Patchy airspace opacities at the left base. IMPRESSION: The above findings suggest interstitial edema and patchy airspace disease at the left base. An inflammatory process is not excluded. Electronically Signed   By: Marybelle Killings M.D.   On: 06/23/2018 13:29    EKG: Independently reviewed. Slightly more pronounced T-wave flattening in V2-3. Atrial paced.  Assessment/Plan Principal Problem:   Shortness of breath Active Problems:   PAF (paroxysmal atrial fibrillation) (HCC)   SSS (sick sinus syndrome) (HCC)   Hypertension   Diabetes mellitus, type II (HCC)   Constipation   Chronic diastolic CHF (congestive heart failure) (HCC)   Shortness of breath Likely acute diastolic heart failure. Question of infiltrate on chest x-ray. Patient is afebrile with mildly elevated WBC. No cough. Elevated BNP. Overall appears euvolemic on exam. Transthoracic Echocardiogram from 05/2017 significant for an EF of 65-70%. -Lasix 20 mg IV daily -Daily weights/strict in and out -Procalcitonin; if undetectable, would discontinue antibiotics  Constipation Cause of patient's abdominal pain. Given a Fleet enema in the ED. CT abdomen pelvis significant for moderate stool burden -Miralax, Dulcolax suppository  Acute on chronic diastolic heart failure Possible acute process, and if so, very mild appearing.  -Lasix as mentioned above  Diabetes mellitus, type 2 Patient is on Januvia as a noutpatient -SSI  Paroxysmal atrial fibrillation Appears to be in sinus rhythm currently. Rate controlled. On chronic anticoagulation  with Eliquis. -Continue amiodarone, Eliquis, Cardizem  Sick sinus syndrome S/p pacemaker -Continue Cardizem/amiodarone as mentioned above  Essential hypertension -Cardizem as mentioned above  Depression/anxiety -Continue Zoloft and ativan prn   DVT prophylaxis: Lovenox Code Status: DNR Family Communication: Called son per patient request; no answer Disposition Plan: Medical floor Consults called: None Admission status: Observation   Cordelia Poche, MD [facility] 06/23/2018, 4:19 PM  If 7PM-7AM,  please contact night-coverage [email or web address] Password [email or web address]

## 2018-06-23 NOTE — ED Notes (Signed)
Per patient request, patient's son updated on patient's admission status.

## 2018-06-23 NOTE — ED Notes (Signed)
ED Provider at bedside. 

## 2018-06-23 NOTE — ED Notes (Addendum)
Patient's son, Hetty Blend, [phone] Patient's daughter, Chrystie Nose, [phone]

## 2018-06-23 NOTE — ED Notes (Signed)
Redressed IV site. Patient bleeding at insertion site. IV patent and flushed.

## 2018-06-24 DIAGNOSIS — I495 Sick sinus syndrome: Secondary | ICD-10-CM | POA: Diagnosis present

## 2018-06-24 DIAGNOSIS — Z20828 Contact with and (suspected) exposure to other viral communicable diseases: Secondary | ICD-10-CM | POA: Diagnosis present

## 2018-06-24 DIAGNOSIS — E876 Hypokalemia: Secondary | ICD-10-CM | POA: Diagnosis present

## 2018-06-24 DIAGNOSIS — Z85828 Personal history of other malignant neoplasm of skin: Secondary | ICD-10-CM | POA: Diagnosis not present

## 2018-06-24 DIAGNOSIS — I351 Nonrheumatic aortic (valve) insufficiency: Secondary | ICD-10-CM | POA: Diagnosis present

## 2018-06-24 DIAGNOSIS — I110 Hypertensive heart disease with heart failure: Secondary | ICD-10-CM | POA: Diagnosis present

## 2018-06-24 DIAGNOSIS — M810 Age-related osteoporosis without current pathological fracture: Secondary | ICD-10-CM | POA: Diagnosis present

## 2018-06-24 DIAGNOSIS — Z882 Allergy status to sulfonamides status: Secondary | ICD-10-CM | POA: Diagnosis not present

## 2018-06-24 DIAGNOSIS — I10 Essential (primary) hypertension: Secondary | ICD-10-CM | POA: Diagnosis not present

## 2018-06-24 DIAGNOSIS — J189 Pneumonia, unspecified organism: Secondary | ICD-10-CM | POA: Diagnosis present

## 2018-06-24 DIAGNOSIS — Z888 Allergy status to other drugs, medicaments and biological substances status: Secondary | ICD-10-CM | POA: Diagnosis not present

## 2018-06-24 DIAGNOSIS — K5900 Constipation, unspecified: Secondary | ICD-10-CM | POA: Diagnosis present

## 2018-06-24 DIAGNOSIS — F329 Major depressive disorder, single episode, unspecified: Secondary | ICD-10-CM | POA: Diagnosis present

## 2018-06-24 DIAGNOSIS — I5033 Acute on chronic diastolic (congestive) heart failure: Secondary | ICD-10-CM | POA: Diagnosis not present

## 2018-06-24 DIAGNOSIS — E119 Type 2 diabetes mellitus without complications: Secondary | ICD-10-CM | POA: Diagnosis present

## 2018-06-24 DIAGNOSIS — Z9841 Cataract extraction status, right eye: Secondary | ICD-10-CM | POA: Diagnosis not present

## 2018-06-24 DIAGNOSIS — R0602 Shortness of breath: Secondary | ICD-10-CM | POA: Diagnosis not present

## 2018-06-24 DIAGNOSIS — Z825 Family history of asthma and other chronic lower respiratory diseases: Secondary | ICD-10-CM | POA: Diagnosis not present

## 2018-06-24 DIAGNOSIS — M19042 Primary osteoarthritis, left hand: Secondary | ICD-10-CM | POA: Diagnosis present

## 2018-06-24 DIAGNOSIS — Z950 Presence of cardiac pacemaker: Secondary | ICD-10-CM | POA: Diagnosis not present

## 2018-06-24 DIAGNOSIS — Z87442 Personal history of urinary calculi: Secondary | ICD-10-CM | POA: Diagnosis not present

## 2018-06-24 DIAGNOSIS — M19041 Primary osteoarthritis, right hand: Secondary | ICD-10-CM | POA: Diagnosis present

## 2018-06-24 DIAGNOSIS — Z961 Presence of intraocular lens: Secondary | ICD-10-CM | POA: Diagnosis present

## 2018-06-24 DIAGNOSIS — Z9842 Cataract extraction status, left eye: Secondary | ICD-10-CM | POA: Diagnosis not present

## 2018-06-24 DIAGNOSIS — I480 Paroxysmal atrial fibrillation: Secondary | ICD-10-CM | POA: Diagnosis present

## 2018-06-24 DIAGNOSIS — F410 Panic disorder [episodic paroxysmal anxiety] without agoraphobia: Secondary | ICD-10-CM | POA: Diagnosis present

## 2018-06-24 LAB — GLUCOSE, CAPILLARY
Glucose-Capillary: 127 mg/dL — ABNORMAL HIGH (ref 70–99)
Glucose-Capillary: 115 mg/dL — ABNORMAL HIGH (ref 70–99)
Glucose-Capillary: 132 mg/dL — ABNORMAL HIGH (ref 70–99)
Glucose-Capillary: 141 mg/dL — ABNORMAL HIGH (ref 70–99)

## 2018-06-24 LAB — URINE CULTURE: Culture: NO GROWTH

## 2018-06-24 LAB — CBC
HCT: 36.8 % (ref 36.0–46.0)
Hemoglobin: 12 g/dL (ref 12.0–15.0)
MCH: 31.8 pg (ref 26.0–34.0)
MCHC: 32.6 g/dL (ref 30.0–36.0)
MCV: 97.6 fL (ref 80.0–100.0)
Platelets: 220 10*3/uL (ref 150–400)
RBC: 3.77 MIL/uL — ABNORMAL LOW (ref 3.87–5.11)
RDW: 15 % (ref 11.5–15.5)
WBC: 16.3 10*3/uL — ABNORMAL HIGH (ref 4.0–10.5)
nRBC: 0 % (ref 0.0–0.2)

## 2018-06-24 LAB — BASIC METABOLIC PANEL
Anion gap: 11 (ref 5–15)
BUN: 8 mg/dL (ref 8–23)
CO2: 25 mmol/L (ref 22–32)
Calcium: 8.3 mg/dL — ABNORMAL LOW (ref 8.9–10.3)
Chloride: 102 mmol/L (ref 98–111)
Creatinine, Ser: 0.63 mg/dL (ref 0.44–1.00)
GFR calc Af Amer: >60 mL/min (ref >60)
GFR calc non Af Amer: >60 mL/min (ref >60)
Glucose, Bld: 127 mg/dL — ABNORMAL HIGH (ref 70–99)
Potassium: 2.9 mmol/L — ABNORMAL LOW (ref 3.5–5.1)
Sodium: 138 mmol/L (ref 135–145)

## 2018-06-24 LAB — PROCALCITONIN: Procalcitonin: <0.1 ng/mL

## 2018-06-24 MED ORDER — MAGNESIUM SULFATE 2 GM/50ML IV SOLN
2.0000 g | Freq: Once | INTRAVENOUS | Status: AC
  Administered 2018-06-24: 2 g via INTRAVENOUS
  Filled 2018-06-24: qty 50

## 2018-06-24 MED ORDER — POTASSIUM CHLORIDE CRYS ER 20 MEQ PO TBCR
40.0000 meq | EXTENDED_RELEASE_TABLET | Freq: Once | ORAL | Status: AC
  Administered 2018-06-24: 40 meq via ORAL
  Filled 2018-06-24: qty 2

## 2018-06-24 NOTE — Progress Notes (Signed)
PT Cancellation Note  Patient Details Name: Jo [name] MRN: [id number] DOB: August 31, 1917   Cancelled Treatment:     PT order received and eval attempted but deferred at request of RN.  Pt had been up in chair with nursing and just returned to bed very fatigued.  Will follow.   Ira Busbin 06/24/2018, 2:31 PM

## 2018-06-24 NOTE — Progress Notes (Signed)
PROGRESS NOTE    Patient: Jo [name]                            PCP: Crist Infante, MD                    DOB: Apr 05, 1917            DOA: 06/23/2018 [id number]             DOS: 06/24/2018, 10:27 AM   LOS: 0 days   Date of Service: The patient was seen and examined on 06/24/2018 Chief Complaint: shortness of breath and fluid on the belly   Subjective:   The patient was seen examined this morning, on O2 by nasal cannula, still complaining of abdominal discomfort.  complaining of severe generalized weaknesses,   Of breath but has improved on O2 by nasal cannula. Still complaining shortness   Brief Narrative:  HPI: Jo [name] is a 85 y.o. female with medical history significant of anxiety, chronic diastolic heart failure, depression, hypertension, constipation, SSS s/p pacemaker, atrial fibrillation, diabetes mellitus.  Patient presented secondary to 2-week history of shortness of breath and fluid around her belly. She reports not using anything to help with her shortness of breath but that Miralax was not helping with her constipation. She reports that her symptoms have not worsened but have stayed persistent, prompting her to seek medical help.  ED Course: Vitals: Afebrile, pulse of 60, RR of 22, BP of 178/76, SpO2 of 96% on 2 L via [place] Labs: Sodium of 132, BNP of 343, WBC of 14.2k Imaging: CT abdomen/pelvis significant for hepatic lesion compatible with hemangioma, bilateral adrenal masses compatible with myelolipoma, moderate stool burden. Chest x-ray significant for interstitial edema and patchy LLL airspace disease Medications/Course: Lasix 20 mg IV, Fleet enema, Ceftriaxone/azithromycin   Assessment & Plan:    Principal Problem:   Shortness of breath Active Problems:   PAF (paroxysmal atrial fibrillation) (HCC)   SSS (sick sinus syndrome) (HCC)   Hypertension   Diabetes mellitus, type II (HCC)   Constipation   Chronic diastolic CHF (congestive heart failure) (HCC)  Acute on chronic diastolic heart failure (HCC)   Shortness of breath - currently on 2 L of oxyge  95% Likely acute diastolic heart failure. Question of infiltrate on chest x-ray. Patient is afebrile with mildly elevated WBC.Elevated BNP. Overall appears euvolemic on exam. Transthoracic Echocardiogram from 05/2017 significant for an EF of 65-70%. -Lasix 20 mg IV daily -Daily weights/strict in and out -Procalcitonin; if undetectable, would discontinue antibiotics   leukocytosis/ cannot rule out community-acquired pneumonia WBC 14.2 >>16.3 - lactic acid 1.0, procalcitonin <1.0 - chest x-ray questionable for infiltrate - apparently patient was started on doxycycline and Rocephin will be continued for today - blood culture was not obtained ED, or on admission, will obtain today 06/24/2018 -SARS-CoV-2 target nucleic acids are NOT DETECTED.    Hypokalemia - will replete accordingly p.o. Kcl  Constipation Cause of patient's abdominal pain. Given a Fleet enema in the ED. CT abdomen pelvis significant for moderate stool burden -Miralax, Dulcolax suppository,  P.o. Mag citrate ---  Awaiting results  Acute on chronic diastolic heart failure Possible acute process, and if so, very mild appearing.  -Lasix as mentioned above - monitoring daily weight, I's and O's  Diabetes mellitus, type 2 Patient is on Januvia as a noutpatient -SSI  Paroxysmal atrial fibrillation Appears to be in sinus rhythm currently. Rate  controlled. On chronic anticoagulation with Eliquis. -Continue amiodarone, Eliquis, Cardizem  Sick sinus syndrome S/p pacemaker -Continue Cardizem/amiodarone as mentioned above  Essential hypertension -Cardizem as mentioned above  Depression/anxiety -Continue Zoloft and ativan prn   DVT prophylaxis: Lovenox Code Status: DNR Family Communication: Called son per patient request;  Patient lives at home alone Disposition Plan:  patient reports that she lives alone at  home, will like to make arrangements her family son upon returning.  Anticipating discharge in 1-2 days Consults called: None Admission status: Observation   imaging CT IMPRESSION: Small bilateral pleural effusions.  Bibasilar atelectasis. Cardiomegaly.  17 mm hypodense lesion in the left hepatic lobe along the falciform ligament with peripheral puddling of contrast most compatible with hemangioma.  Bilateral adrenal masses, indeterminate on the left but stable in size. Right adrenal masses most compatible with myelolipoma.  Mild bilateral hydronephrosis without obstructing stone. Moderate distention of the urinary bladder.  Moderate stool burden in the colon.  Aortic atherosclerosis.    Procedures:   No admission procedures for hospital encounter.    Antimicrobials:  Anti-infectives (From admission, onward)   Start     Dose/Rate Route Frequency Ordered Stop   06/24/18 1430  cefTRIAXone (ROCEPHIN) 1 g in sodium chloride 0.9 % 100 mL IVPB     1 g 200 mL/hr over 30 Minutes Intravenous Every 24 hours 06/23/18 2206     06/24/18 1000  doxycycline (VIBRAMYCIN) 100 mg in sodium chloride 0.9 % 250 mL IVPB     100 mg 125 mL/hr over 120 Minutes Intravenous Every 12 hours 06/23/18 2206     06/23/18 1430  cefTRIAXone (ROCEPHIN) 2 g in sodium chloride 0.9 % 100 mL IVPB  Status:  Discontinued     2 g 200 mL/hr over 30 Minutes Intravenous Every 24 hours 06/23/18 1425 06/23/18 2206   06/23/18 1430  azithromycin (ZITHROMAX) 500 mg in sodium chloride 0.9 % 250 mL IVPB  Status:  Discontinued     500 mg 250 mL/hr over 60 Minutes Intravenous Every 24 hours 06/23/18 1425 06/23/18 2206       Medication:  . amiodarone  200 mg Oral Daily  . apixaban  2.5 mg Oral BID  . carvedilol  6.25 mg Oral BID WC  . diltiazem  240 mg Oral Daily  . [START ON 06/25/2018] estradiol  1 Applicatorful Vaginal Q M,W,F  . feeding supplement (ENSURE ENLIVE)  237 mL Oral BID BM  . furosemide  20 mg  Intravenous Daily  . insulin aspart  0-9 Units Subcutaneous TID WC  . LORazepam  0.25 mg Oral BID  . LORazepam  0.5 mg Oral QHS  . polyethylene glycol  17 g Oral BID  . [START ON 06/25/2018] rosuvastatin  2.5 mg Oral Q M,W,F  . sertraline  50 mg Oral Daily  . sodium chloride flush  3 mL Intravenous Q12H    sodium chloride, acetaminophen **OR** acetaminophen, magnesium hydroxide, ondansetron **OR** ondansetron (ZOFRAN) IV, sodium chloride flush     Objective:   Vitals:   06/23/18 1830 06/23/18 1932 06/23/18 1950 06/24/18 0504  BP: (!) 168/77 (!) 157/74  (!) 150/53  Pulse: 86 79  64  Resp: 16 20  18   Temp:  98.4 F (36.9 C)  98.7 F (37.1 C)  TempSrc:  Oral  Oral  SpO2: 95% 93%  95%  Weight:   58.2 kg   Height:   5' (1.524 m)     Intake/Output Summary (Last 24 hours) at 06/24/2018 1027 Last data  filed at 06/24/2018 0954 Gross per 24 hour  Intake 546.7 ml  Output 1400 ml  Net -853.3 ml   Filed Weights   06/23/18 1950  Weight: 58.2 kg     Examination:    General exam: Appears calm and comfortable  BP (!) 150/53 (BP Location: Left Arm)   Pulse 64   Temp 98.7 F (37.1 C) (Oral)   Resp 18   Ht 5' (1.524 m)   Wt 58.2 kg   SpO2 95%   BMI 25.06 kg/m    Physical Exam  Constitution:  Alert, cooperative,  Mild shortness of breath, on 2 L of oxygen, satting greater 92% Psychiatric: Normal and stable mood and affect, cognition intact,   HEENT: Normocephalic, PERRL, otherwise with in Normal limits  Chest:Chest symmetric Cardio vascular:  S1/S2, RRR, No murmure, No Rubs or Gallops  pulmonary: Clear to auscultation bilaterally, respirations unlabored, negative wheezes / crackles Abdomen: Soft,  Mild diffuse tenderness-t, mild distended, bowel sounds,no masses, no organomegaly Muscular skeletal: Limited exam - in bed, able to move all 4 extremities, Normal strength,  Neuro: CNII-XII intact. , normal motor and sensation, reflexes intact  Extremities: No pitting edema  lower extremities, +2 pulses  Skin: Dry, warm to touch, negative for any Rashes, No open wounds Wounds: per nursing documentation - none   LABs:  CBC Latest Ref Rng & Units 06/24/2018 06/23/2018 06/05/2017  WBC 4.0 - 10.5 K/uL 16.3(H) 14.2(H) 13.4(H)  Hemoglobin 12.0 - 15.0 g/dL 12.0 11.8(L) 10.1(L)  Hematocrit 36.0 - 46.0 % 36.8 36.2 32.3(L)  Platelets 150 - 400 K/uL 220 222 242   CMP Latest Ref Rng & Units 06/24/2018 06/23/2018 06/06/2017  Glucose 70 - 99 mg/dL 127(H) 145(H) 175(H)  BUN 8 - 23 mg/dL 8 10 19   Creatinine 0.44 - 1.00 mg/dL 0.63 0.66 0.89  Sodium 135 - 145 mmol/L 138 132(L) 141  Potassium 3.5 - 5.1 mmol/L 2.9(L) 3.7 4.2  Chloride 98 - 111 mmol/L 102 99 103  CO2 22 - 32 mmol/L 25 25 28   Calcium 8.9 - 10.3 mg/dL 8.3(L) 8.4(L) 8.9  Total Protein 6.5 - 8.1 g/dL - 6.4(L) -  Total Bilirubin 0.3 - 1.2 mg/dL - 0.6 -  Alkaline Phos 38 - 126 U/L - 79 -  AST 15 - 41 U/L - 20 -  ALT 0 - 44 U/L - 19 -    SIGNED: Deatra James, MD, FACP, FHM. [facility],  Pager [phone]  If 7PM-7AM, please contact night-coverage [email or web address] [email or web address] [facility] 06/24/2018, 10:27 AM

## 2018-06-24 NOTE — Plan of Care (Signed)
  Problem: Pain Managment: Goal: General experience of comfort will improve Outcome: Progressing   Problem: Elimination: Goal: Will not experience complications related to bowel motility Outcome: Progressing   Problem: Elimination: Goal: Will not experience complications related to urinary retention Outcome: Progressing   Problem: Safety: Goal: Ability to remain free from injury will improve Outcome: Progressing   Problem: Skin Integrity: Goal: Risk for impaired skin integrity will decrease Outcome: Progressing

## 2018-06-24 NOTE — Care Management Obs Status (Signed)
[facility] NOTIFICATION   Patient Details  Name: Jo [name] MRN: [id number] Date of Birth: [birth year]   Medicare Observation Status Notification Given:  Yes    Joaquin Courts, RN 06/24/2018, 9:43 AM

## 2018-06-25 ENCOUNTER — Encounter (HOSPITAL_COMMUNITY): Payer: Self-pay

## 2018-06-25 DIAGNOSIS — K5900 Constipation, unspecified: Secondary | ICD-10-CM

## 2018-06-25 DIAGNOSIS — I10 Essential (primary) hypertension: Secondary | ICD-10-CM

## 2018-06-25 LAB — GLUCOSE, CAPILLARY
Glucose-Capillary: 113 mg/dL — ABNORMAL HIGH (ref 70–99)
Glucose-Capillary: 242 mg/dL — ABNORMAL HIGH (ref 70–99)
Glucose-Capillary: 164 mg/dL — ABNORMAL HIGH (ref 70–99)
Glucose-Capillary: 94 mg/dL (ref 70–99)

## 2018-06-25 LAB — PROCALCITONIN: Procalcitonin: <0.1 ng/mL

## 2018-06-25 MED ORDER — POTASSIUM CHLORIDE CRYS ER 20 MEQ PO TBCR
20.0000 meq | EXTENDED_RELEASE_TABLET | Freq: Two times a day (BID) | ORAL | Status: DC
  Administered 2018-06-25 – 2018-06-27 (×4): 20 meq via ORAL
  Filled 2018-06-25 (×4): qty 1

## 2018-06-25 MED ORDER — DILTIAZEM HCL 60 MG PO TABS
60.0000 mg | ORAL_TABLET | Freq: Four times a day (QID) | ORAL | Status: DC
  Administered 2018-06-25 – 2018-06-27 (×8): 60 mg via ORAL
  Filled 2018-06-25 (×9): qty 1

## 2018-06-25 MED ORDER — POTASSIUM CHLORIDE CRYS ER 20 MEQ PO TBCR
40.0000 meq | EXTENDED_RELEASE_TABLET | Freq: Once | ORAL | Status: AC
  Administered 2018-06-25: 40 meq via ORAL
  Filled 2018-06-25: qty 2

## 2018-06-25 MED ORDER — FUROSEMIDE 10 MG/ML IJ SOLN: 40.0000 mg | Freq: Two times a day (BID) | INTRAMUSCULAR | Status: DC

## 2018-06-25 MED ORDER — FUROSEMIDE 10 MG/ML IJ SOLN
20.0000 mg | Freq: Once | INTRAMUSCULAR | Status: AC
  Administered 2018-06-25: 20 mg via INTRAVENOUS
  Filled 2018-06-25: qty 2

## 2018-06-25 MED ORDER — FUROSEMIDE 10 MG/ML IJ SOLN
20.0000 mg | Freq: Two times a day (BID) | INTRAMUSCULAR | Status: DC
  Administered 2018-06-25 – 2018-06-26 (×2): 20 mg via INTRAVENOUS
  Filled 2018-06-25 (×2): qty 2

## 2018-06-25 NOTE — Evaluation (Signed)
Physical Therapy Evaluation Patient Details Name: Jo [name] MRN: [id number] DOB: [birth year] Today's Date: 06/25/2018   History of Present Illness  85 y.o. female with medical history significant of anxiety, chronic diastolic heart failure, depression, hypertension, constipation, SSS s/p pacemaker, atrial fibrillation, diabetes mellitus.  Patient presented secondary to 2-week history of shortness of breath and fluid around her belly  Clinical Impression  Pt admitted with above diagnosis. Pt currently with functional limitations due to the deficits listed below (see PT Problem List). Pt will benefit from skilled PT to increase their independence and safety with mobility to allow discharge to the venue listed below.  Pt assisted with transferring to [facility] and then recliner. SPO2 dropped to 87% room air after transfer to [facility] so 2L O2 [place] reapplied.  Pt felt unable to tolerate ambulating today however reports minimal ambulator with rollator at baseline.    Follow Up Recommendations Supervision/Assistance - 24 hour;SNF    Equipment Recommendations  None recommended by PT    Recommendations for Other Services       Precautions / Restrictions Precautions Precautions: Fall Precaution Comments: monitor sats      Mobility  Bed Mobility Overal bed mobility: Needs Assistance Bed Mobility: Supine to Sit     Supine to sit: Min assist     General bed mobility comments: slight assist for upper body  Transfers Overall transfer level: Needs assistance Equipment used: Rolling walker (2 wheeled) Transfers: Sit to/from [facility] Sit to Stand: Min assist Stand pivot transfers: Min assist       General transfer comment: verbal cues for hand placement, pt up to [facility] and then transferred 180* to recliner using RW, assist for steadying, SPo2 dropped to 87% on room air after transfer so 2L O2 [place] reapplied  Ambulation/Gait             General Gait Details: pt felt unable at  this time  Stairs            Wheelchair Mobility    Modified Rankin (Stroke Patients Only)       Balance Overall balance assessment: Needs assistance         Standing balance support: Bilateral upper extremity supported Standing balance-Leahy Scale: Poor Standing balance comment: reliant on UE support                             Pertinent Vitals/Pain Pain Assessment: Faces Faces Pain Scale: No hurt Pain Intervention(s): Monitored during session;Repositioned    Home Living Family/patient expects to be discharged to:: Private residence Living Arrangements: Alone   Type of Home: House       Home Layout: One level Home Equipment: Environmental consultant - 4 wheels      Prior Function Level of Independence: Needs assistance   Gait / Transfers Assistance Needed: pt reports short distance ambulator with rollator           Hand Dominance        Extremity/Trunk Assessment        Lower Extremity Assessment Lower Extremity Assessment: Generalized weakness       Communication   Communication: HOH  Cognition Arousal/Alertness: Awake/alert Behavior During Therapy: WFL for tasks assessed/performed                                   General Comments: appears WFL, pt very [facility]  General Comments      Exercises     Assessment/Plan    PT Assessment Patient needs continued PT services  PT Problem List Decreased strength;Decreased mobility;Decreased activity tolerance;Decreased balance;Decreased knowledge of use of DME       PT Treatment Interventions Gait training;Therapeutic exercise;DME instruction;Therapeutic activities;Functional mobility training;Balance training;Patient/family education    PT Goals (Current goals can be found in the Care Plan section)  Acute Rehab PT Goals PT Goal Formulation: With patient Time For Goal Achievement: 07/09/18 Potential to Achieve Goals: Good    Frequency Min 3X/week   Barriers to  discharge        Co-evaluation               AM-PAC PT "6 Clicks" Mobility  Outcome Measure Help needed turning from your back to your side while in a flat bed without using bedrails?: A Little Help needed moving from lying on your back to sitting on the side of a flat bed without using bedrails?: A Little Help needed moving to and from a bed to a chair (including a wheelchair)?: A Little Help needed standing up from a chair using your arms (e.g., wheelchair or bedside chair)?: A Little Help needed to walk in hospital room?: A Little Help needed climbing 3-5 steps with a railing? : A Lot 6 Click Score: 17    End of Session Equipment Utilized During Treatment: Gait belt;Oxygen Activity Tolerance: Patient tolerated treatment well Patient left: with call bell/phone within reach;in bed;with chair alarm set Nurse Communication: Mobility status PT Visit Diagnosis: Muscle weakness (generalized) (M62.81);Difficulty in walking, not elsewhere classified (R26.2)    Time: 6067-7034 PT Time Calculation (min) (ACUTE ONLY): 23 min   Charges:   PT Evaluation $PT Eval Low Complexity: [place], PT, DPT Acute Rehabilitation Services Office: [phone] Pager: [phone]  Trena Platt 06/25/2018, 12:45 PM

## 2018-06-25 NOTE — Progress Notes (Signed)
Pt abd distended and pt c/o of bloating and lost of appetite. Pt up to [facility] voided 100 bladder scan 115 post Lasix administration. MD updated. SRP, RN

## 2018-06-25 NOTE — Progress Notes (Signed)
PROGRESS NOTE    Patient: Jo [name]                            PCP: Crist Infante, MD                    DOB: 1917-05-26            DOA: 06/23/2018 [id number]             DOS: 06/25/2018, 2:27 PM   LOS: 1 day   Date of Service: The patient was seen and examined on 06/25/2018 Chief Complaint: shortness of breath and fluid on the belly   Subjective:   Has been diuresing some but output / input balance is only -800 cc since admisison.  Up in chair after walking sats in the high 80's.     Brief Narrative:  HPI: Sandi Towe Kibble is a 85 y.o. female with medical history significant of anxiety, chronic diastolic heart failure, depression, hypertension, constipation, SSS s/p pacemaker, atrial fibrillation, diabetes mellitus.  Patient presented secondary to 2-week history of shortness of breath and fluid around her belly. She reports not using anything to help with her shortness of breath but that Miralax was not helping with her constipation. She reports that her symptoms have not worsened but have stayed persistent, prompting her to seek medical help.  ED Course: Vitals: Afebrile, pulse of 60, RR of 22, BP of 178/76, SpO2 of 96% on 2 L via [place] Labs: Sodium of 132, BNP of 343, WBC of 14.2k Imaging: CT abdomen/pelvis significant for hepatic lesion compatible with hemangioma, bilateral adrenal masses compatible with myelolipoma, moderate stool burden. Chest x-ray significant for interstitial edema and patchy LLL airspace disease Medications/Course: Lasix 20 mg IV, Fleet enema, Ceftriaxone/azithromycin   Assessment & Plan:    Principal Problem:   Shortness of breath Active Problems:   PAF (paroxysmal atrial fibrillation) (HCC)   SSS (sick sinus syndrome) (HCC)   Hypertension   Diabetes mellitus, type II (HCC)   Constipation   Chronic diastolic CHF (congestive heart failure) (HCC)   Acute on chronic diastolic heart failure (HCC)   Acute on chronic diastolic (congestive) heart failure (HCC)   Shortness of breath/ acute on chronic diast CHF: - currently on 2 L of oxygen. Patient is afebrile with mildly elevated WBC.Elevated BNP. Transthoracic Echocardiogram from 05/2017 significant for an EF of 65-70%. Rales on exam today, no LE edema.  - will increase Lasix 20 mg IV bid - Daily weights/strict in and out - Procalcitonin undetectable > will dc IV abx  - fluid restriction  Hypokalemia - will replete accordingly p.o. Kcl  Constipation  - cause of patient's abdominal pain. Gave a Fleet enema in the ED. CT abdomen pelvis moderate stool burden and plain film showed stool ball large size - didn't respond to miralax/ dulcolax > ordered SS enema and disimpaction if needed  Diabetes mellitus, type 2 Patient is on Januvia as a noutpatient -SSI here  Paroxysmal atrial fibrillation Appears to be in sinus rhythm currently. Rate controlled. On chronic anticoagulation with Eliquis. -Continue amiodarone, Eliquis, Cardizem  Sick sinus syndrome S/p pacemaker -Continue Cardizem/amiodarone as mentioned above  Essential hypertension -Cardizem as mentioned above  Depression/anxiety -Continue Zoloft and ativan prn   DVT prophylaxis: Lovenox Code Status: DNR Family Communication: none here today, patient lives at home alone Disposition Plan:  patient reports that she lives alone at home, will like to make arrangements her  family son upon returning.  Anticipating discharge in 1-2 days Consults called: None Admission status: Inpatient   Kelly Splinter MD  pgr [phone] 06/25/2018, 2:33 PM     imaging CT IMPRESSION: Small bilateral pleural effusions.  Bibasilar atelectasis. Cardiomegaly.  17 mm hypodense lesion in the left hepatic lobe along the falciform ligament with peripheral puddling of contrast most compatible with hemangioma.  Bilateral adrenal masses, indeterminate on the left but stable in size. Right adrenal masses most compatible with myelolipoma.  Mild  bilateral hydronephrosis without obstructing stone. Moderate distention of the urinary bladder.  Moderate stool burden in the colon.  Aortic atherosclerosis.    Procedures:   No admission procedures for hospital encounter.    Antimicrobials:  Anti-infectives (From admission, onward)   Start     Dose/Rate Route Frequency Ordered Stop   06/24/18 1430  cefTRIAXone (ROCEPHIN) 1 g in sodium chloride 0.9 % 100 mL IVPB     1 g 200 mL/hr over 30 Minutes Intravenous Every 24 hours 06/23/18 2206     06/24/18 1000  doxycycline (VIBRAMYCIN) 100 mg in sodium chloride 0.9 % 250 mL IVPB     100 mg 125 mL/hr over 120 Minutes Intravenous Every 12 hours 06/23/18 2206     06/23/18 1430  cefTRIAXone (ROCEPHIN) 2 g in sodium chloride 0.9 % 100 mL IVPB  Status:  Discontinued     2 g 200 mL/hr over 30 Minutes Intravenous Every 24 hours 06/23/18 1425 06/23/18 2206   06/23/18 1430  azithromycin (ZITHROMAX) 500 mg in sodium chloride 0.9 % 250 mL IVPB  Status:  Discontinued     500 mg 250 mL/hr over 60 Minutes Intravenous Every 24 hours 06/23/18 1425 06/23/18 2206       Medication:  . amiodarone  200 mg Oral Daily  . apixaban  2.5 mg Oral BID  . carvedilol  6.25 mg Oral BID WC  . diltiazem  60 mg Oral Q6H  . estradiol  1 Applicatorful Vaginal Q M,W,F  . feeding supplement (ENSURE ENLIVE)  237 mL Oral BID BM  . furosemide  40 mg Intravenous BID  . insulin aspart  0-9 Units Subcutaneous TID WC  . LORazepam  0.25 mg Oral BID  . LORazepam  0.5 mg Oral QHS  . polyethylene glycol  17 g Oral BID  . potassium chloride  20 mEq Oral BID  . potassium chloride  40 mEq Oral Once  . rosuvastatin  2.5 mg Oral Q M,W,F  . sertraline  50 mg Oral Daily  . sodium chloride flush  3 mL Intravenous Q12H    sodium chloride, acetaminophen **OR** acetaminophen, magnesium hydroxide, ondansetron **OR** ondansetron (ZOFRAN) IV, sodium chloride flush     Objective:   Vitals:   06/24/18 0504 06/24/18 1339  06/24/18 2200 06/25/18 0529  BP: (!) 150/53 (!) 141/58 (!) 151/65 140/63  Pulse: 64 66 75 60  Resp: 18 16 14 16   Temp: 98.7 F (37.1 C) 98.1 F (36.7 C) 98.8 F (37.1 C) 97.6 F (36.4 C)  TempSrc: Oral Oral Oral Oral  SpO2: 95% 98% 95% 96%  Weight:      Height:        Intake/Output Summary (Last 24 hours) at 06/25/2018 1427 Last data filed at 06/25/2018 1233 Gross per 24 hour  Intake 1319.66 ml  Output 2400 ml  Net -1080.34 ml   Filed Weights   06/23/18 1950  Weight: 58.2 kg     Examination:    General exam: Appears calm and  comfortable  BP 140/63 (BP Location: Right Arm)   Pulse 60   Temp 97.6 F (36.4 C) (Oral)   Resp 16   Ht 5' (1.524 m)   Wt 58.2 kg   SpO2 96%   BMI 25.06 kg/m    Physical Exam  Constitution:  Alert, cooperative,  Mild shortness of breath, on 2 L of oxygen, satting greater 92% Psychiatric: Normal and stable mood and affect, cognition intact,   HEENT: Normocephalic, PERRL, otherwise with in Normal limits  Chest:Chest symmetric Cardio vascular:  S1/S2, RRR, No murmure, No Rubs or Gallops  pulmonary: bilateral basilar crackles, no wheezing Abdomen: Soft,  Mild diffuse tenderness-t, mild distended, bowel sounds,no masses, no organomegaly Muscular skeletal: Limited exam - in bed, able to move all 4 extremities, Normal strength,  Neuro: CNII-XII intact. , normal motor and sensation, reflexes intact  Extremities: No pitting edema lower extremities, +2 pulses  Skin: Dry, warm to touch, negative for any Rashes, No open wounds Wounds: per nursing documentation - none   LABs:  CBC Latest Ref Rng & Units 06/24/2018 06/23/2018 06/05/2017  WBC 4.0 - 10.5 K/uL 16.3(H) 14.2(H) 13.4(H)  Hemoglobin 12.0 - 15.0 g/dL 12.0 11.8(L) 10.1(L)  Hematocrit 36.0 - 46.0 % 36.8 36.2 32.3(L)  Platelets 150 - 400 K/uL 220 222 242   CMP Latest Ref Rng & Units 06/24/2018 06/23/2018 06/06/2017  Glucose 70 - 99 mg/dL 127(H) 145(H) 175(H)  BUN 8 - 23 mg/dL 8 10 19   Creatinine  0.44 - 1.00 mg/dL 0.63 0.66 0.89  Sodium 135 - 145 mmol/L 138 132(L) 141  Potassium 3.5 - 5.1 mmol/L 2.9(L) 3.7 4.2  Chloride 98 - 111 mmol/L 102 99 103  CO2 22 - 32 mmol/L 25 25 28   Calcium 8.9 - 10.3 mg/dL 8.3(L) 8.4(L) 8.9  Total Protein 6.5 - 8.1 g/dL - 6.4(L) -  Total Bilirubin 0.3 - 1.2 mg/dL - 0.6 -  Alkaline Phos 38 - 126 U/L - 79 -  AST 15 - 41 U/L - 20 -  ALT 0 - 44 U/L - 19 -    SIGNED: Sol Blazing, MD, FACP, [facility].   If 7PM-7AM, please contact night-coverage [email or web address] [email or web address] [facility] 06/25/2018, 2:27 PM

## 2018-06-25 NOTE — Progress Notes (Signed)
Pt s/o SOB vital signs noted, voiding small amounts, increased abd distention, MD updated, I & O cath 300 cc clear yellow urine noted, MD updated. SRP, RN

## 2018-06-25 NOTE — Progress Notes (Addendum)
Spoke with pt son and updated him, answering question, etc. Appreciative of the care pt has received. Pt resting in bed, post 500cc Soap sud enema. Pt c/o constipation, disimpacted and several  large brown"rock-hard" solid stools noted. Pt up to beside commode and continue to have large solid brown stool. Pt states some relief. SRP, RN

## 2018-06-26 ENCOUNTER — Encounter (HOSPITAL_COMMUNITY): Payer: Self-pay | Admitting: *Deleted

## 2018-06-26 LAB — BASIC METABOLIC PANEL
Anion gap: 8 (ref 5–15)
BUN: 18 mg/dL (ref 8–23)
CO2: 30 mmol/L (ref 22–32)
Calcium: 9.3 mg/dL (ref 8.9–10.3)
Chloride: 100 mmol/L (ref 98–111)
Creatinine, Ser: 0.63 mg/dL (ref 0.44–1.00)
GFR calc Af Amer: >60 mL/min (ref >60)
GFR calc non Af Amer: >60 mL/min (ref >60)
Glucose, Bld: 155 mg/dL — ABNORMAL HIGH (ref 70–99)
Potassium: 4.1 mmol/L (ref 3.5–5.1)
Sodium: 138 mmol/L (ref 135–145)

## 2018-06-26 LAB — GLUCOSE, CAPILLARY
Glucose-Capillary: 156 mg/dL — ABNORMAL HIGH (ref 70–99)
Glucose-Capillary: 155 mg/dL — ABNORMAL HIGH (ref 70–99)
Glucose-Capillary: 119 mg/dL — ABNORMAL HIGH (ref 70–99)
Glucose-Capillary: 113 mg/dL — ABNORMAL HIGH (ref 70–99)

## 2018-06-26 MED ORDER — FUROSEMIDE 10 MG/ML IJ SOLN
20.0000 mg | Freq: Two times a day (BID) | INTRAMUSCULAR | Status: DC
  Administered 2018-06-26 – 2018-06-27 (×2): 20 mg via INTRAVENOUS
  Filled 2018-06-26 (×2): qty 2

## 2018-06-26 MED ORDER — FUROSEMIDE 40 MG PO TABS: 40.0000 mg | ORAL_TABLET | Freq: Every day | ORAL | Status: DC

## 2018-06-26 NOTE — Progress Notes (Addendum)
SATURATION QUALIFICATIONS: (This note is used to comply with regulatory documentation for home oxygen)  Patient Saturations on Room Air at Rest = 91%  Patient Saturations on Room Air while Ambulating = 85% getting from bed to chair  Patient Saturations on 2 Liters of oxygen while Ambulating = 94%  Please briefly explain why patient needs home oxygen: Patient requires oxygen d/t desaturation on RA with activity.

## 2018-06-26 NOTE — Progress Notes (Signed)
Physical Therapy Treatment Patient Details Name: Jo [name] MRN: [id number] DOB: March 17, 1917 Today's Date: 06/26/2018    History of Present Illness 85 y.o. female with medical history significant of anxiety, chronic diastolic heart failure, depression, hypertension, constipation, SSS s/p pacemaker, atrial fibrillation, diabetes mellitus.  Patient presented secondary to 2-week history of shortness of breath and fluid around her belly    PT Comments    Progressing slowly with mobility. Pt remained on [place] O2 during session. She was able to ambulate a short distance on today. She is weak and fatigues easily. Per chart review, pt's family is declining SNF and HHPT f/u. Will continue to follow.      Follow Up Recommendations  Home health PT;Supervision/Assistance - 24 hour(per chart, family is declining SNF and HH)     Equipment Recommendations  None recommended by PT    Recommendations for Other Services       Precautions / Restrictions Precautions Precautions: Fall Precaution Comments: monitor sats Restrictions Weight Bearing Restrictions: No    Mobility  Bed Mobility Overal bed mobility: Needs Assistance Bed Mobility: Sidelying to Sit;Sit to Supine   Sidelying to sit: Min assist;HOB elevated   Sit to supine: Min assist;HOB elevated   General bed mobility comments: Assist for trunk and LEs. Increased time.   Transfers Overall transfer level: Needs assistance Equipment used: 4-wheeled walker Transfers: Sit to/from Stand Sit to Stand: Min assist;From elevated surface         General transfer comment: VCs safety, technique, hand placement. Assist to rise, stabilize, control descent.   Ambulation/Gait Ambulation/Gait assistance: Min assist Gait Distance (Feet): 15 Feet Assistive device: 4-wheeled walker Gait Pattern/deviations: Step-through pattern     General Gait Details: Assist to stabilize pt throughout short distance. Pt is unsteady and fatigues easily. Remained  on [place] O2.    Stairs             Wheelchair Mobility    Modified Rankin (Stroke Patients Only)       Balance Overall balance assessment: Needs assistance         Standing balance support: Bilateral upper extremity supported Standing balance-Leahy Scale: Poor                              Cognition Arousal/Alertness: Awake/alert Behavior During Therapy: WFL for tasks assessed/performed                                   General Comments: appears WFL, pt very HOH      Exercises      General Comments        Pertinent Vitals/Pain Pain Assessment: Faces Faces Pain Scale: Hurts even more Pain Location: low back Pain Descriptors / Indicators: Aching;Discomfort;Grimacing Pain Intervention(s): Monitored during session;Repositioned    Home Living                      Prior Function            PT Goals (current goals can now be found in the care plan section) Progress towards PT goals: Progressing toward goals    Frequency    Min 3X/week      PT Plan Current plan remains appropriate    Co-evaluation              AM-PAC PT "6 Clicks" Mobility   Outcome Measure  Help  needed turning from your back to your side while in a flat bed without using bedrails?: A Little Help needed moving from lying on your back to sitting on the side of a flat bed without using bedrails?: A Little Help needed moving to and from a bed to a chair (including a wheelchair)?: A Little Help needed standing up from a chair using your arms (e.g., wheelchair or bedside chair)?: A Little Help needed to walk in hospital room?: A Little Help needed climbing 3-5 steps with a railing? : A Lot 6 Click Score: 17    End of Session Equipment Utilized During Treatment: Gait belt;Oxygen Activity Tolerance: Patient limited by fatigue Patient left: in bed;with call bell/phone within reach;with bed alarm set   PT Visit Diagnosis: Muscle weakness  (generalized) (M62.81);Difficulty in walking, not elsewhere classified (R26.2)     Time: 6803-2122 PT Time Calculation (min) (ACUTE ONLY): 17 min  Charges:  $Gait Training: 8-22 mins                        Weston Anna, PT Acute Rehabilitation Services Pager: [phone] Office: [phone]

## 2018-06-26 NOTE — TOC Initial Note (Signed)
Transition of Care [facility]) - Initial/Assessment Note    Patient Details  Name: Jo [name] MRN: [id number] Date of Birth: January 15, 1918  [facility] CM/SW Contact:    Lynnell Catalan, RN Phone Number: 06/26/2018, 11:37 AM  Clinical Narrative:                 Spoke with pt at bedside about dc plans. Pt defers to daughter [name] for dc planning. This CM called [name] and was informed that the family plans to provide 24hour care for pt at dc. [name] would not like anyone coming into the home at this time. She states that they still have the exercises that were given to them when she had HHPT last time. She states that they will work on the exercises with her themselves and contact the PCP office if they notice that she needs more help. [name] states that pt has a RW at home and doesn't need any additional equipment.  Marney Doctor RN,BSN [phone]

## 2018-06-26 NOTE — Progress Notes (Signed)
PROGRESS NOTE    Jo [name]  NWG:[id number] DOB: 1917-08-04 DOA: 06/23/2018 PCP: Crist Infante, MD   Brief Narrative: Patient is a 85 year old female with history of anxiety, chronic diastolic heart failure, depression, hypertension, constipation, sick sinus syndrome and status post pacemaker, atrial fibrillation, diabetes mellitus who presented with 2 days history of shortness of breath .  She was suspected to have fluid overload.  Started on diuretics.  Assessment & Plan:   Principal Problem:   Shortness of breath Active Problems:   PAF (paroxysmal atrial fibrillation) (HCC)   SSS (sick sinus syndrome) (HCC)   Hypertension   Diabetes mellitus, type II (HCC)   Constipation   Chronic diastolic CHF (congestive heart failure) (HCC)   Acute on chronic diastolic heart failure (HCC)   Acute on chronic diastolic (congestive) heart failure (HCC)  Shortness of breath/acute on chronic diastolic CHF: Currently on 2 L of oxygen at present.  She is not on oxygen at home.  Will monitor her on room air.  Elevated BNP on presentation.  Transthoracic echo on on 5/19 showed ejection fraction of 65 to 70%.  Currently she is reaching euvolemic state.  She has mild basilar crackles but she does not have peripheral edema.  Will change Lasix to oral 40 mg daily.  She takes Lasix 20 mg daily at home.  Hypokalemia: Continue supplementation  Constipation: She was complaining of abdominal discomfort on presentation.  Given soapsuds enema ,Also had disimpaction.  Had bowel movement finally.  Continue bowel regimen  Diabetes type 2: Continue sliding's insulin here.  On Januvia at home  Proximal A. fib: Currently in sinus rhythm.  Rate is controlled.  Continue amiodarone, Eliquis, Cardizem  History of sick sinus syndrome: Status post pacemaker.  Continue Cardizem, amiodarone  Hypertension: Current blood pressure stable.  Depression/anxiety: Continue Zoloft, Ativan as needed.  Deconditioning/debility: Seen  by physical therapy and recommended skilled nursing facility on discharge.  Social worker consulted         DVT prophylaxis: Lovenox Code Status: DNR Family Communication: Called and discussed with son on phone Disposition Plan: SNF as soon as bed  Is available   Consultants: None  Procedures:None  Antimicrobials:  Anti-infectives (From admission, onward)   Start     Dose/Rate Route Frequency Ordered Stop   06/24/18 1430  cefTRIAXone (ROCEPHIN) 1 g in sodium chloride 0.9 % 100 mL IVPB  Status:  Discontinued     1 g 200 mL/hr over 30 Minutes Intravenous Every 24 hours 06/23/18 2206 06/25/18 1437   06/24/18 1000  doxycycline (VIBRAMYCIN) 100 mg in sodium chloride 0.9 % 250 mL IVPB  Status:  Discontinued     100 mg 125 mL/hr over 120 Minutes Intravenous Every 12 hours 06/23/18 2206 06/25/18 1437   06/23/18 1430  cefTRIAXone (ROCEPHIN) 2 g in sodium chloride 0.9 % 100 mL IVPB  Status:  Discontinued     2 g 200 mL/hr over 30 Minutes Intravenous Every 24 hours 06/23/18 1425 06/23/18 2206   06/23/18 1430  azithromycin (ZITHROMAX) 500 mg in sodium chloride 0.9 % 250 mL IVPB  Status:  Discontinued     500 mg 250 mL/hr over 60 Minutes Intravenous Every 24 hours 06/23/18 1425 06/23/18 2206      Subjective: Patient seen and examined the bedside this morning.  Hemodynamically stable.  Comfortable.  Denies any shortness of breath, cough.  She does not have any peripheral edema.  Very hard of hearing  Objective: Vitals:   06/25/18 2021 06/26/18 0056 06/26/18  0536 06/26/18 0544  BP: (!) 151/88 128/60 (!) 112/92 (!) 154/62  Pulse: 66 65 68 63  Resp: 20  18 18   Temp: 98.5 F (36.9 C)  98.3 F (36.8 C) 98 F (36.7 C)  TempSrc: Oral  Oral Oral  SpO2: 94% 97% 94% 95%  Weight:      Height:        Intake/Output Summary (Last 24 hours) at 06/26/2018 1101 Last data filed at 06/26/2018 0900 Gross per 24 hour  Intake 240 ml  Output 1450 ml  Net -1210 ml   Filed Weights   06/23/18 1950   Weight: 58.2 kg    Examination:  General exam: Very pleasant, debilitated elderly female HEENT:PERRL,Oral mucosa moist, Ear/Nose normal on gross exam,HOH Respiratory system: Bilateral decreased air entry in the bases, mild crackles Cardiovascular system: S1 & S2 heard, RRR. No JVD, murmurs, rubs, gallops or clicks. No pedal edema. Gastrointestinal system: Abdomen is nondistended, soft and nontender. No organomegaly or masses felt. Normal bowel sounds heard. Central nervous system: Alert and oriented. No focal neurological deficits. Extremities: No edema, no clubbing ,no cyanosis, distal peripheral pulses palpable. Skin: Senile purpura    Data Reviewed: I have personally reviewed following labs and imaging studies  CBC: Recent Labs  Lab 06/23/18 1244 06/24/18 0344  WBC 14.2* 16.3*  NEUTROABS 11.2*  --   HGB 11.8* 12.0  HCT 36.2 36.8  MCV 95.5 97.6  PLT 222 614   Basic Metabolic Panel: Recent Labs  Lab 06/23/18 1244 06/24/18 0344 06/26/18 0515  NA 132* 138 138  K 3.7 2.9* 4.1  CL 99 102 100  CO2 25 25 30   GLUCOSE 145* 127* 155*  BUN 10 8 18   CREATININE 0.66 0.63 0.63  CALCIUM 8.4* 8.3* 9.3   GFR: Estimated Creatinine Clearance: 30.6 mL/min (by C-G formula based on SCr of 0.63 mg/dL). Liver Function Tests: Recent Labs  Lab 06/23/18 1244  AST 20  ALT 19  ALKPHOS 79  BILITOT 0.6  PROT 6.4*  ALBUMIN 3.4*   Recent Labs  Lab 06/23/18 1244  LIPASE 23   No results for input(s): AMMONIA in the last 168 hours. Coagulation Profile: No results for input(s): INR, PROTIME in the last 168 hours. Cardiac Enzymes: No results for input(s): CKTOTAL, CKMB, CKMBINDEX, TROPONINI in the last 168 hours. BNP (last 3 results) No results for input(s): PROBNP in the last 8760 hours. HbA1C: No results for input(s): HGBA1C in the last 72 hours. CBG: Recent Labs  Lab 06/25/18 0749 06/25/18 1121 06/25/18 1643 06/25/18 2101 06/26/18 0740  GLUCAP 113* 242* 164* 94 156*    Lipid Profile: No results for input(s): CHOL, HDL, LDLCALC, TRIG, CHOLHDL, LDLDIRECT in the last 72 hours. Thyroid Function Tests: No results for input(s): TSH, T4TOTAL, FREET4, T3FREE, THYROIDAB in the last 72 hours. Anemia Panel: No results for input(s): VITAMINB12, FOLATE, FERRITIN, TIBC, IRON, RETICCTPCT in the last 72 hours. Sepsis Labs: Recent Labs  Lab 06/23/18 1244 06/24/18 0344 06/25/18 0414  PROCALCITON <0.10 <0.10 <0.10  LATICACIDVEN 1.0  --   --     Recent Results (from the past 240 hour(s))  Urine Culture     Status: None   Collection Time: 06/23/18 12:44 PM  Result Value Ref Range Status   Specimen Description   Final    URINE, CLEAN CATCH Performed at [facility], [place] [address] [place], [place] [address]    Special Requests   Final    NONE Performed at [facility], [place] [address] [place], [place] [address]    Culture   Final    NO GROWTH Performed at [facility], [place] [address], [place], [place] [address]    Report Status 06/24/2018 FINAL  Final  SARS Coronavirus 2 (CEPHEID- Performed in [facility] hospital lab), Hosp Order     Status: None   Collection Time: 06/23/18 12:44 PM  Result Value Ref Range Status   SARS Coronavirus 2 NEGATIVE NEGATIVE Final    Comment: (NOTE) If result is NEGATIVE SARS-CoV-2 target nucleic acids are NOT DETECTED. The SARS-CoV-2 RNA is generally detectable in upper and lower  respiratory specimens during the acute phase of infection. The lowest  concentration of SARS-CoV-2 viral copies this assay can detect is 250  copies / mL. A negative result does not preclude SARS-CoV-2 infection  and should not be used as the sole basis for treatment or other  patient management decisions.  A negative result [place] occur with  improper specimen collection / handling, submission of specimen other  than nasopharyngeal swab, presence of viral mutation(s) within the  areas targeted by this  assay, and inadequate number of viral copies  (<250 copies / mL). A negative result must be combined with clinical  observations, patient history, and epidemiological information. If result is POSITIVE SARS-CoV-2 target nucleic acids are DETECTED. The SARS-CoV-2 RNA is generally detectable in upper and lower  respiratory specimens dur ing the acute phase of infection.  Positive  results are indicative of active infection with SARS-CoV-2.  Clinical  correlation with patient history and other diagnostic information is  necessary to determine patient infection status.  Positive results do  not rule out bacterial infection or co-infection with other viruses. If result is PRESUMPTIVE POSTIVE SARS-CoV-2 nucleic acids [place] BE PRESENT.   A presumptive positive result was obtained on the submitted specimen  and confirmed on repeat testing.  While 2019 novel coronavirus  (SARS-CoV-2) nucleic acids Weiss be present in the submitted sample  additional confirmatory testing [place] be necessary for epidemiological  and / or clinical management purposes  to differentiate between  SARS-CoV-2 and other Sarbecovirus currently known to infect humans.  If clinically indicated additional testing with an alternate test  methodology [phone]) is advised. The SARS-CoV-2 RNA is generally  detectable in upper and lower respiratory sp ecimens during the acute  phase of infection. The expected result is Negative. Fact Sheet for Patients:  StrictlyIdeas.no Fact Sheet for Healthcare Providers: BankingDealers.co.za This test is not yet approved or cleared by the [place] FDA and has been authorized for detection and/or diagnosis of SARS-CoV-2 by FDA under an Emergency Use Authorization (EUA).  This EUA will remain in effect (meaning this test can be used) for the duration of the COVID-19 declaration under Section 564(b)(1) of the Act, 21 U.S.C. section 360bbb-3(b)(1),  unless the authorization is terminated or revoked sooner. Performed at [facility], [place] [address] [place], [place] [address]   Culture, blood (routine x 2)     Status: None (Preliminary result)   Collection Time: 06/24/18 12:52 PM  Result Value Ref Range Status   Specimen Description   Final    BLOOD RIGHT HAND Performed at [place] [address], [place], [place] [address]    Special Requests   Final    BOTTLES DRAWN AEROBIC ONLY Blood Culture adequate volume Performed at [place] [address], [place], [place] [address]    Culture   Final  NO GROWTH 2 DAYS Performed at [facility], [place] [address], [place], [place] [address]    Report Status PENDING  Incomplete  Culture, blood (routine x 2)     Status: None (Preliminary result)   Collection Time: 06/24/18 12:53 PM  Result Value Ref Range Status   Specimen Description   Final    RIGHT ANTECUBITAL Performed at [place] [address] [place], [place] [address]    Special Requests   Final    BOTTLES DRAWN AEROBIC ONLY Blood Culture adequate volume Performed at [place] [address] [place], [place] [address]    Culture   Final    NO GROWTH 2 DAYS Performed at [place] [address], [place], [place] [address]    Report Status PENDING  Incomplete         Radiology Studies: No results found.      Scheduled Meds: . amiodarone  200 mg Oral Daily  . apixaban  2.5 mg Oral BID  . carvedilol  6.25 mg Oral BID WC  . diltiazem  60 mg Oral Q6H  . estradiol  1 Applicatorful Vaginal Q M,W,F  . feeding supplement (ENSURE ENLIVE)  237 mL Oral BID BM  . [START ON 06/27/2018] furosemide  40 mg Oral Daily  . insulin aspart  0-9 Units Subcutaneous TID WC  . LORazepam  0.25 mg Oral BID  . LORazepam  0.5 mg Oral QHS  . polyethylene glycol  17 g Oral BID  . potassium chloride  20 mEq  Oral BID  . rosuvastatin  2.5 mg Oral Q M,W,F  . sertraline  50 mg Oral Daily   Continuous Infusions: . sodium chloride 20 mL/hr at 06/23/18 1246     LOS: 2 days    Time spent: 35 mins.More than 50% of that time was spent in counseling and/or coordination of care.      Shelly Coss, MD [facility] Pager [phone]  If 7PM-7AM, please contact night-coverage [email or web address] Password [email or web address] 06/26/2018, 11:01 AM

## 2018-06-27 LAB — BASIC METABOLIC PANEL
Anion gap: 7 (ref 5–15)
BUN: 20 mg/dL (ref 8–23)
CO2: 30 mmol/L (ref 22–32)
Calcium: 9.6 mg/dL (ref 8.9–10.3)
Chloride: 98 mmol/L (ref 98–111)
Creatinine, Ser: 0.68 mg/dL (ref 0.44–1.00)
GFR calc Af Amer: >60 mL/min (ref >60)
GFR calc non Af Amer: >60 mL/min (ref >60)
Glucose, Bld: 156 mg/dL — ABNORMAL HIGH (ref 70–99)
Potassium: 4.3 mmol/L (ref 3.5–5.1)
Sodium: 135 mmol/L (ref 135–145)

## 2018-06-27 LAB — GLUCOSE, CAPILLARY
Glucose-Capillary: 176 mg/dL — ABNORMAL HIGH (ref 70–99)
Glucose-Capillary: 124 mg/dL — ABNORMAL HIGH (ref 70–99)

## 2018-06-27 MED ORDER — FUROSEMIDE 40 MG PO TABS: 40.0000 mg | ORAL_TABLET | Freq: Every day | ORAL | 0 refills | Status: DC

## 2018-06-27 MED ORDER — POTASSIUM CHLORIDE CRYS ER 20 MEQ PO TBCR: 20.0000 meq | EXTENDED_RELEASE_TABLET | Freq: Every day | ORAL | 0 refills | Status: DC

## 2018-06-27 NOTE — Care Management Important Message (Signed)
Important Message  Patient Details IM Letter given to Marney Doctor RN to present to the Patient Name: Jo [name] MRN: [id number] Date of Birth: 14-Mar-1917   Medicare Important Message Given:  Yes    Kerin Salen 06/27/2018, 11:16 AM

## 2018-06-27 NOTE — TOC Transition Note (Signed)
Transition of Care [facility]) - CM/SW Discharge Note   Patient Details  Name: Jo [name] MRN: [id number] Date of Birth: 03-01-1917  Transition of Care [facility]) CM/SW Contact:  Lynnell Catalan, RN Phone Number: 06/27/2018, 11:20 AM   Clinical Narrative:    Pt for home 02 at dc. [employer] contacted for delivery of home 02. Marney Doctor RN,BSN [phone]

## 2018-06-27 NOTE — Discharge Summary (Signed)
Physician Discharge Summary  Jo [name] ZOX:[id number] DOB: Mar 18, 1917 DOA: 06/23/2018  PCP: Crist Infante, MD  Admit date: 06/23/2018 Discharge date: 06/27/2018  Admitted From: Home Disposition:  Home  Discharge Condition:Stable CODE STATUS:FULL Diet recommendation: Heart Healthy  Brief/Interim Summary:  Patient is a 85 year old female with history of anxiety, chronic diastolic heart failure, depression, hypertension, constipation, sick sinus syndrome and status post pacemaker, atrial fibrillation, diabetes mellitus who presented with 2 days history of shortness of breath .  She was suspected to have fluid overload.  Started on IV diuretics.  Respiratory status gradually improved.  Peripheral edema resolved.  IV diuretics changed to oral on discharge.  She qualified for home oxygen.  Physical therapy evaluated her and recommended skilled nursing facility but family wanted to take her home.  Home health arranged. She is hemodynamically stable for discharge to home today.  Following problems were addressed during her hospitalization:  Shortness of breath/acute on chronic diastolic CHF: Currently on 2 L of oxygen at present.  She is not on oxygen at home.  Elevated BNP on presentation.  Transthoracic echo on on 5/19 showed ejection fraction of 65 to 70%.  Currently she is reaching euvolemic state.  She had mild basilar crackles but she does not have peripheral edema.  Will change Lasix to oral 40 mg daily.  She takes Lasix 20 mg daily at home.  Chronic respiratory insufficiency: Due to congestive heart failure.  Qualified  for home oxygen.  Hypokalemia: Continue supplementation  Constipation: She was complaining of abdominal discomfort on presentation.  Given soapsuds enema ,Also had disimpaction.  Had bowel movement finally.  Continue bowel regimen  Diabetes type 2: Continue sliding's insulin here.  On Januvia at home  Proximal A. fib: Currently in sinus rhythm.  Rate is controlled.   Continue amiodarone, Eliquis, Cardizem.Follow up with cardiology as an outpatient.  History of sick sinus syndrome: Status post pacemaker.  Continue Cardizem, amiodarone  Hypertension: Current blood pressure stable.  Depression/anxiety: Continue Zoloft, Ativan as needed.  Deconditioning/debility: Seen by physical therapy and recommended skilled nursing facility on discharge.  Family want to take her home.Home Health arranged.   Discharge Diagnoses:  Principal Problem:   Shortness of breath Active Problems:   PAF (paroxysmal atrial fibrillation) (HCC)   SSS (sick sinus syndrome) (HCC)   Hypertension   Diabetes mellitus, type II (HCC)   Constipation   Chronic diastolic CHF (congestive heart failure) (HCC)   Acute on chronic diastolic heart failure (HCC)   Acute on chronic diastolic (congestive) heart failure [facility])    Discharge Instructions  Discharge Instructions    Diet - low sodium heart healthy   Complete by:  As directed    Discharge instructions   Complete by:  As directed    1)Please take prescribed medications as instructed. 2)Follow up with your PCP in a week. 3)Follow up with your cardiologist in 2-3 weeks.   Increase activity slowly   Complete by:  As directed      Allergies as of 06/27/2018      Reactions   Sulfonamide Derivatives Rash   "it was all over my legs"   Citalopram Hydrobromide Other (See Comments)   Pt does not remember reaction      Medication List    TAKE these medications   acetaminophen 500 MG tablet Commonly known as:  TYLENOL Take 500-1,000 mg by mouth every 6 (six) hours as needed for mild pain, moderate pain, fever or headache.   amiodarone 200 MG tablet Commonly  known as:  PACERONE TAKE 1 TABLET BY MOUTH EVERY DAY   apixaban 2.5 MG Tabs tablet Commonly known as:  Eliquis Take 1 tablet (2.5 mg total) by mouth 2 (two) times daily.   azelastine 0.1 % nasal spray Commonly known as:  ASTELIN Place 1 spray into both nostrils  daily as needed for rhinitis. Use in each nostril as directed   carvedilol 6.25 MG tablet Commonly known as:  COREG Take 6.25 mg by mouth 2 (two) times daily with a meal. Hold for SBP<110 OR HR <60   Crestor 5 MG tablet Generic drug:  rosuvastatin Take 2.5 mg by mouth every Monday, Wednesday, and Friday.   diltiazem 240 MG 24 hr capsule Commonly known as:  CARDIZEM CD Take 240 mg by mouth daily.   estradiol 0.1 MG/GM vaginal cream Commonly known as:  ESTRACE Place 1 Applicatorful vaginally every Monday, Wednesday, and Friday.   feeding supplement (ENSURE ENLIVE) Liqd Take 237 mLs by mouth 2 (two) times daily between meals. What changed:    when to take this  reasons to take this   furosemide 40 MG tablet Commonly known as:  LASIX Take 1 tablet (40 mg total) by mouth daily for 30 days. Start taking on:  June 28, 2018 What changed:    medication strength  how much to take   Januvia 100 MG tablet Generic drug:  sitaGLIPtin Take 100 mg by mouth daily.   LORazepam 0.5 MG tablet Commonly known as:  ATIVAN Take 0.5-1 tablets (0.25-0.5 mg total) by mouth See admin instructions. Take 0.25 mg tablet in the morning and afternoon, then 0.5 mg at bedtime   polyethylene glycol 17 g packet Commonly known as:  MIRALAX / GLYCOLAX Take 17 g by mouth daily as needed for mild constipation.   potassium chloride SA 20 MEQ tablet Commonly known as:  K-DUR Take 1 tablet (20 mEq total) by mouth daily.   sertraline 50 MG tablet Commonly known as:  ZOLOFT Take 50 mg by mouth daily.   Vitamin D (Ergocalciferol) 1.25 MG (50000 UT) Caps capsule Commonly known as:  DRISDOL Take 50,000 Units by mouth every Wednesday.            Durable Medical Equipment  (From admission, onward)         Start     Ordered   06/27/18 1122  For home use only DME oxygen  Once    Question Answer Comment  Length of Need Lifetime   Mode or (Route) Mask   Liters per Minute 2   Frequency Continuous  (stationary and portable oxygen unit needed)   Oxygen delivery system Gas      06/27/18 1122         Follow-up Information    Crist Infante, MD. Schedule an appointment as soon as possible for a visit in 1 week(s).   Specialty:  Internal Medicine Contact information: [place] [address] [phone]          Allergies  Allergen Reactions  . Sulfonamide Derivatives Rash    "it was all over my legs"  . Citalopram Hydrobromide Other (See Comments)    Pt does not remember reaction    Consultations: None  Procedures/Studies: Dg Abdomen 1 View  Result Date: 06/23/2018 CLINICAL DATA:  Abdominal pain. EXAM: ABDOMEN - 1 VIEW COMPARISON:  June 27, 2015 FINDINGS: A small stool ball is seen in the rectum measuring 6.7 by 4.4 cm. Bowel gas pattern is otherwise normal. No bowel obstruction.  No free air, portal venous gas, or pneumatosis. IMPRESSION: Small stool ball in the rectum.  No other abnormalities. Electronically Signed   By: Dorise Bullion III M.D   On: 06/23/2018 13:30   Ct Abdomen Pelvis W Contrast  Result Date: 06/23/2018 CLINICAL DATA:  Diffuse abdominal pain.  Abdominal distention EXAM: CT ABDOMEN AND PELVIS WITH CONTRAST TECHNIQUE: Multidetector CT imaging of the abdomen and pelvis was performed using the standard protocol following bolus administration of intravenous contrast. CONTRAST:  149mL OMNIPAQUE IOHEXOL 300 MG/ML  SOLN COMPARISON:  07/06/2012 FINDINGS: Lower chest: Small bilateral pleural effusions. Mild cardiomegaly. Bibasilar atelectasis. Hepatobiliary: Hypodense lesion noted within the left hepatic lobe measures 17 mm with peripheral contrast puddling, most compatible with hemangioma. Gallbladder is distended. No biliary ductal dilatation. Pancreas: No pancreatic ductal dilatation. 10 mm low-density cystic area anteriorly within the pancreatic head, new since prior study. Spleen: No focal abnormality.  Normal size. Adrenals/Urinary Tract: 5.5 cm fat  attenuating mass in the right adrenal gland most compatible with myelolipoma mild fullness of the renal collecting systems bilaterally, likely mild hydronephrosis. Urinary bladder is moderately distended. No obstructing stones. Cortical thinning within the left kidney. Left adrenal mass measures up to 3.1 cm and is stable. Stomach/Bowel: Moderate stool throughout the colon. Stomach and small bowel decompressed. No evidence of obstruction. Vascular/Lymphatic: Aortic atherosclerosis. No enlarged abdominal or pelvic lymph nodes. Reproductive: Prior hysterectomy.  No adnexal masses. Other: No free fluid or free air. Musculoskeletal: No acute bony abnormality. IMPRESSION: Small bilateral pleural effusions.  Bibasilar atelectasis. Cardiomegaly. 17 mm hypodense lesion in the left hepatic lobe along the falciform ligament with peripheral puddling of contrast most compatible with hemangioma. Bilateral adrenal masses, indeterminate on the left but stable in size. Right adrenal masses most compatible with myelolipoma. Mild bilateral hydronephrosis without obstructing stone. Moderate distention of the urinary bladder. Moderate stool burden in the colon. Aortic atherosclerosis. Electronically Signed   By: Rolm Baptise M.D.   On: 06/23/2018 15:53   Dg Chest Port 1 View  Result Date: 06/23/2018 CLINICAL DATA:  Abdominal pain.  Low oxygen saturation. EXAM: PORTABLE CHEST 1 VIEW COMPARISON:  06/04/2017 FINDINGS: Left subclavian double lead pacemaker device and leads are stable. Upper normal heart size. Pulmonary vascularity is indistinct. Interstitial infiltrates throughout both lungs are stable. Patchy airspace opacities at the left base. IMPRESSION: The above findings suggest interstitial edema and patchy airspace disease at the left base. An inflammatory process is not excluded. Electronically Signed   By: Marybelle Killings M.D.   On: 06/23/2018 13:29       Subjective: Patient seen and examined the bedside this morning.   Hemodynamically stable.  Very comfortable.  Denies any shortness of breath.  Discharge Exam: Vitals:   06/26/18 2020 06/27/18 0526  BP: 135/65 133/67  Pulse: 76 75  Resp: 18 18  Temp: 98.5 F (36.9 C) 98.7 F (37.1 C)  SpO2: 95% 96%   Vitals:   06/26/18 1126 06/26/18 1423 06/26/18 2020 06/27/18 0526  BP: 139/70 130/72 135/65 133/67  Pulse: 78 62 76 75  Resp:  16 18 18   Temp:  98.2 F (36.8 C) 98.5 F (36.9 C) 98.7 F (37.1 C)  TempSrc:  Oral Oral Oral  SpO2: 96% 95% 95% 96%  Weight:    58.6 kg  Height:        General: Pt is alert, awake, not in acute distress,elderly female Cardiovascular: RRR, S1/S2 +, no rubs, no gallops Respiratory: CTA bilaterally, no wheezing, no rhonchi Abdominal: Soft, NT,  ND, bowel sounds + Extremities: no edema, no cyanosis    The results of significant diagnostics from this hospitalization (including imaging, microbiology, ancillary and laboratory) are listed below for reference.     Microbiology: Recent Results (from the past 240 hour(s))  Urine Culture     Status: None   Collection Time: 06/23/18 12:44 PM  Result Value Ref Range Status   Specimen Description   Final    URINE, CLEAN CATCH Performed at [facility], [place] [address] [place], [place] [address]    Special Requests   Final    NONE Performed at [facility], [place] [address], [place], [place] [address]    Culture   Final    NO GROWTH Performed at [facility], [place] [address], [place], [place] [address]    Report Status 06/24/2018 FINAL  Final  SARS Coronavirus 2 (CEPHEID- Performed in [place] hospital lab), Hosp Order     Status: None   Collection Time: 06/23/18 12:44 PM  Result Value Ref Range Status   SARS Coronavirus 2 NEGATIVE NEGATIVE Final    Comment: (NOTE) If result is NEGATIVE SARS-CoV-2 target nucleic acids are NOT DETECTED. The SARS-CoV-2 RNA is generally detectable in upper and lower  respiratory specimens  during the acute phase of infection. The lowest  concentration of SARS-CoV-2 viral copies this assay can detect is 250  copies / mL. A negative result does not preclude SARS-CoV-2 infection  and should not be used as the sole basis for treatment or other  patient management decisions.  A negative result [place] occur with  improper specimen collection / handling, submission of specimen other  than nasopharyngeal swab, presence of viral mutation(s) within the  areas targeted by this assay, and inadequate number of viral copies  (<250 copies / mL). A negative result must be combined with clinical  observations, patient history, and epidemiological information. If result is POSITIVE SARS-CoV-2 target nucleic acids are DETECTED. The SARS-CoV-2 RNA is generally detectable in upper and lower  respiratory specimens dur ing the acute phase of infection.  Positive  results are indicative of active infection with SARS-CoV-2.  Clinical  correlation with patient history and other diagnostic information is  necessary to determine patient infection status.  Positive results do  not rule out bacterial infection or co-infection with other viruses. If result is PRESUMPTIVE POSTIVE SARS-CoV-2 nucleic acids Brokaw BE PRESENT.   A presumptive positive result was obtained on the submitted specimen  and confirmed on repeat testing.  While 2019 novel coronavirus  (SARS-CoV-2) nucleic acids Kimrey be present in the submitted sample  additional confirmatory testing [place] be necessary for epidemiological  and / or clinical management purposes  to differentiate between  SARS-CoV-2 and other Sarbecovirus currently known to infect humans.  If clinically indicated additional testing with an alternate test  methodology [phone]) is advised. The SARS-CoV-2 RNA is generally  detectable in upper and lower respiratory sp ecimens during the acute  phase of infection. The expected result is Negative. Fact Sheet for Patients:   StrictlyIdeas.no Fact Sheet for Healthcare Providers: BankingDealers.co.za This test is not yet approved or cleared by the [place] FDA and has been authorized for detection and/or diagnosis of SARS-CoV-2 by FDA under an Emergency Use Authorization (EUA).  This EUA will remain in effect (meaning this test can be used) for the duration of the COVID-19 declaration under Section 564(b)(1) of the Act, 21 U.S.C. section 360bbb-3(b)(1), unless the authorization is terminated or  revoked sooner. Performed at [facility], [place] [address], [place], [place] [address]   Culture, blood (routine x 2)     Status: None (Preliminary result)   Collection Time: 06/24/18 12:52 PM  Result Value Ref Range Status   Specimen Description   Final    BLOOD RIGHT HAND Performed at [place] [address], [place], [place] [address]    Special Requests   Final    BOTTLES DRAWN AEROBIC ONLY Blood Culture adequate volume Performed at [place] [address] [place], [place] [address]    Culture   Final    NO GROWTH 3 DAYS Performed at [facility], [place] [address] [place], [place] [address]    Report Status PENDING  Incomplete  Culture, blood (routine x 2)     Status: None (Preliminary result)   Collection Time: 06/24/18 12:53 PM  Result Value Ref Range Status   Specimen Description   Final    RIGHT ANTECUBITAL Performed at [place] [address], [place], [place] [address]    Special Requests   Final    BOTTLES DRAWN AEROBIC ONLY Blood Culture adequate volume Performed at [place] [address], [place], [place] [address]    Culture   Final    NO GROWTH 3 DAYS Performed at [facility], [place] [address] [place], [place] [address]    Report Status PENDING  Incomplete     Labs: BNP (last 3 results) Recent Labs     06/23/18 1244  BNP 948.0*   Basic Metabolic Panel: Recent Labs  Lab 06/23/18 1244 06/24/18 0344 06/26/18 0515 06/27/18 0528  NA 132* 138 138 135  K 3.7 2.9* 4.1 4.3  CL 99 102 100 98  CO2 25 25 30 30   GLUCOSE 145* 127* 155* 156*  BUN 10 8 18 20   CREATININE 0.66 0.63 0.63 0.68  CALCIUM 8.4* 8.3* 9.3 9.6   Liver Function Tests: Recent Labs  Lab 06/23/18 1244  AST 20  ALT 19  ALKPHOS 79  BILITOT 0.6  PROT 6.4*  ALBUMIN 3.4*   Recent Labs  Lab 06/23/18 1244  LIPASE 23   No results for input(s): AMMONIA in the last 168 hours. CBC: Recent Labs  Lab 06/23/18 1244 06/24/18 0344  WBC 14.2* 16.3*  NEUTROABS 11.2*  --   HGB 11.8* 12.0  HCT 36.2 36.8  MCV 95.5 97.6  PLT 222 220   Cardiac Enzymes: No results for input(s): CKTOTAL, CKMB, CKMBINDEX, TROPONINI in the last 168 hours. BNP: Invalid input(s): POCBNP CBG: Recent Labs  Lab 06/26/18 0740 06/26/18 1141 06/26/18 1704 06/26/18 2056 06/27/18 0736  GLUCAP 156* 155* 119* 113* 176*   D-Dimer No results for input(s): DDIMER in the last 72 hours. Hgb A1c No results for input(s): HGBA1C in the last 72 hours. Lipid Profile No results for input(s): CHOL, HDL, LDLCALC, TRIG, CHOLHDL, LDLDIRECT in the last 72 hours. Thyroid function studies No results for input(s): TSH, T4TOTAL, T3FREE, THYROIDAB in the last 72 hours.  Invalid input(s): FREET3 Anemia work up No results for input(s): VITAMINB12, FOLATE, FERRITIN, TIBC, IRON, RETICCTPCT in the last 72 hours. Urinalysis    Component Value Date/Time   COLORURINE COLORLESS (A) 06/23/2018 1244   APPEARANCEUR CLEAR 06/23/2018 1244   LABSPEC 1.003 (L) 06/23/2018 1244   PHURINE 7.0 06/23/2018 [place] 06/23/2018 [place] 06/23/2018 [place] 06/23/2018  [place] 06/23/2018 [place] 06/23/2018 1244   UROBILINOGEN 1.0 07/06/2012 1130   NITRITE NEGATIVE 06/23/2018 1244    LEUKOCYTESUR NEGATIVE 06/23/2018 1244   Sepsis Labs Invalid input(s): PROCALCITONIN,  WBC,  LACTICIDVEN Microbiology Recent Results (from the past 240 hour(s))  Urine Culture     Status: None   Collection Time: 06/23/18 12:44 PM  Result Value Ref Range Status   Specimen Description   Final    URINE, CLEAN CATCH Performed at [facility], [place] [address], [place], [place] [address]    Special Requests   Final    NONE Performed at [facility], [place] [address], [place], [place] [address]    Culture   Final    NO GROWTH Performed at [facility], [place] [address], [place], [place] [address]    Report Status 06/24/2018 FINAL  Final  SARS Coronavirus 2 (CEPHEID- Performed in [place] hospital lab), Hosp Order     Status: None   Collection Time: 06/23/18 12:44 PM  Result Value Ref Range Status   SARS Coronavirus 2 NEGATIVE NEGATIVE Final    Comment: (NOTE) If result is NEGATIVE SARS-CoV-2 target nucleic acids are NOT DETECTED. The SARS-CoV-2 RNA is generally detectable in upper and lower  respiratory specimens during the acute phase of infection. The lowest  concentration of SARS-CoV-2 viral copies this assay can detect is 250  copies / mL. A negative result does not preclude SARS-CoV-2 infection  and should not be used as the sole basis for treatment or other  patient management decisions.  A negative result [place] occur with  improper specimen collection / handling, submission of specimen other  than nasopharyngeal swab, presence of viral mutation(s) within the  areas targeted by this assay, and inadequate number of viral copies  (<250 copies / mL). A negative result must be combined with clinical  observations, patient history, and epidemiological information. If result is POSITIVE SARS-CoV-2 target nucleic acids are DETECTED. The SARS-CoV-2 RNA is generally detectable in upper and lower  respiratory specimens dur ing the acute phase of  infection.  Positive  results are indicative of active infection with SARS-CoV-2.  Clinical  correlation with patient history and other diagnostic information is  necessary to determine patient infection status.  Positive results do  not rule out bacterial infection or co-infection with other viruses. If result is PRESUMPTIVE POSTIVE SARS-CoV-2 nucleic acids [place] BE PRESENT.   A presumptive positive result was obtained on the submitted specimen  and confirmed on repeat testing.  While 2019 novel coronavirus  (SARS-CoV-2) nucleic acids [place] be present in the submitted sample  additional confirmatory testing [place] be necessary for epidemiological  and / or clinical management purposes  to differentiate between  SARS-CoV-2 and other Sarbecovirus currently known to infect humans.  If clinically indicated additional testing with an alternate test  methodology [phone]) is advised. The SARS-CoV-2 RNA is generally  detectable in upper and lower respiratory sp ecimens during the acute  phase of infection. The expected result is Negative. Fact Sheet for Patients:  StrictlyIdeas.no Fact Sheet for Healthcare Providers: BankingDealers.co.za This test is not yet approved or cleared by the [place] FDA and has been authorized for detection and/or diagnosis of SARS-CoV-2 by FDA under an Emergency Use Authorization (EUA).  This EUA will remain in effect (meaning this test can be used) for the duration of the COVID-19 declaration under Section 564(b)(1) of the Act, 21 U.S.C. section  360bbb-3(b)(1), unless the authorization is terminated or revoked sooner. Performed at [facility], [place] [address], [place], [place] [address]   Culture, blood (routine x 2)     Status: None (Preliminary result)   Collection Time: 06/24/18 12:52 PM  Result Value Ref Range Status   Specimen Description   Final    BLOOD RIGHT HAND Performed at [place] [address], [place], [place] [address]    Special Requests   Final    BOTTLES DRAWN AEROBIC ONLY Blood Culture adequate volume Performed at [place] [address] [place], [place] [address]    Culture   Final    NO GROWTH 3 DAYS Performed at  [facility], [place] [address], [place], [place] [address]    Report Status PENDING  Incomplete  Culture, blood (routine x 2)     Status: None (Preliminary result)   Collection Time: 06/24/18 12:53 PM  Result Value Ref Range Status   Specimen Description   Final    RIGHT ANTECUBITAL Performed at [place] [address], [place], [place] [address]    Special Requests   Final    BOTTLES DRAWN AEROBIC ONLY Blood Culture adequate volume Performed at [place] [address] [place], [place] [address]    Culture   Final    NO GROWTH 3 DAYS Performed at [facility], [place] [address] [place], [place] [address]    Report Status PENDING  Incomplete    Please note: You were cared for by a hospitalist during your hospital stay. Once you are discharged, your primary care physician will handle any further medical issues. Please note that NO REFILLS for any discharge medications will be authorized once you are discharged, as it is imperative that you return to your primary care physician (or establish a relationship with a primary care physician if you do not have one) for your post hospital discharge needs so that they can reassess your need for medications and monitor your lab values.    Time coordinating discharge: 40 minutes  SIGNED:   Shelly Coss, MD  [facility] 06/27/2018, 11:25 AM Pager [phone]  If 7PM-7AM, please contact night-coverage [email or web address] Password [email or web address]

## 2018-06-27 NOTE — Discharge Instructions (Signed)
Information on my medicine - ELIQUIS® (apixaban) ° °This medication education was reviewed with me or my healthcare representative as part of my discharge preparation.  The pharmacist that spoke with me during my hospital stay was:  Kitiara Hintze, RPH-CPP ° °Why was Eliquis® prescribed for you? °Eliquis® was prescribed for you to reduce the risk of a blood clot forming that can cause a stroke if you have a medical condition called atrial fibrillation (a type of irregular heartbeat). ° °What do You need to know about Eliquis® ? °Take your Eliquis® TWICE DAILY - one tablet in the morning and one tablet in the evening with or without food. If you have difficulty swallowing the tablet whole please discuss with your pharmacist how to take the medication safely. ° °Take Eliquis® exactly as prescribed by your doctor and DO NOT stop taking Eliquis® without talking to the doctor who prescribed the medication.  Stopping Stopher increase your risk of developing a stroke.  Refill your prescription before you run out. ° °After discharge, you should have regular check-up appointments with your healthcare provider that is prescribing your Eliquis®.  In the future your dose [place] need to be changed if your kidney function or weight changes by a significant amount or as you get older. ° °What do you do if you miss a dose? °If you miss a dose, take it as soon as you remember on the same day and resume taking twice daily.  Do not take more than one dose of ELIQUIS at the same time to make up a missed dose. ° °Important Safety Information °A possible side effect of Eliquis® is bleeding. You should call your healthcare provider right away if you experience any of the following: °? Bleeding from an injury or your nose that does not stop. °? Unusual colored urine (red or dark brown) or unusual colored stools (red or black). °? Unusual bruising for unknown reasons. °? A serious fall or if you hit your head (even if there is no bleeding). ° °Some  medicines Spayd interact with Eliquis® and might increase your risk of bleeding or clotting while on Eliquis®. To help avoid this, consult your healthcare provider or pharmacist prior to using any new prescription or non-prescription medications, including herbals, vitamins, non-steroidal anti-inflammatory drugs (NSAIDs) and supplements. ° °This website has more information on Eliquis® (apixaban): http://www.eliquis.com/eliquis/home ° °

## 2018-06-27 NOTE — Progress Notes (Signed)
Patient is stable for discharge. Discharge instructions and medications have been reviewed with the patient and her daughter [name], and all questions answered. AVS and prescriptions given to [facility]. Reviewed how to use the Oxygen tank with [name] as well. She verbalized understanding.

## 2018-06-29 LAB — CULTURE, BLOOD (ROUTINE X 2)
Culture: NO GROWTH
Culture: NO GROWTH
Special Requests: ADEQUATE
Special Requests: ADEQUATE

## 2018-06-30 DIAGNOSIS — F410 Panic disorder [episodic paroxysmal anxiety] without agoraphobia: Secondary | ICD-10-CM | POA: Diagnosis not present

## 2018-06-30 DIAGNOSIS — E119 Type 2 diabetes mellitus without complications: Secondary | ICD-10-CM | POA: Diagnosis not present

## 2018-06-30 DIAGNOSIS — R2689 Other abnormalities of gait and mobility: Secondary | ICD-10-CM | POA: Diagnosis not present

## 2018-06-30 DIAGNOSIS — I480 Paroxysmal atrial fibrillation: Secondary | ICD-10-CM | POA: Diagnosis not present

## 2018-06-30 DIAGNOSIS — Z7901 Long term (current) use of anticoagulants: Secondary | ICD-10-CM | POA: Diagnosis not present

## 2018-06-30 DIAGNOSIS — M810 Age-related osteoporosis without current pathological fracture: Secondary | ICD-10-CM | POA: Diagnosis not present

## 2018-06-30 DIAGNOSIS — R300 Dysuria: Secondary | ICD-10-CM | POA: Diagnosis not present

## 2018-06-30 DIAGNOSIS — I5032 Chronic diastolic (congestive) heart failure: Secondary | ICD-10-CM | POA: Diagnosis not present

## 2018-06-30 DIAGNOSIS — K5900 Constipation, unspecified: Secondary | ICD-10-CM | POA: Diagnosis not present

## 2018-06-30 DIAGNOSIS — Z7984 Long term (current) use of oral hypoglycemic drugs: Secondary | ICD-10-CM | POA: Diagnosis not present

## 2018-06-30 DIAGNOSIS — M545 Low back pain: Secondary | ICD-10-CM | POA: Diagnosis not present

## 2018-06-30 DIAGNOSIS — Z743 Need for continuous supervision: Secondary | ICD-10-CM | POA: Diagnosis not present

## 2018-06-30 DIAGNOSIS — Z9981 Dependence on supplemental oxygen: Secondary | ICD-10-CM | POA: Diagnosis not present

## 2018-06-30 DIAGNOSIS — I110 Hypertensive heart disease with heart failure: Secondary | ICD-10-CM | POA: Diagnosis not present

## 2018-07-02 DIAGNOSIS — E119 Type 2 diabetes mellitus without complications: Secondary | ICD-10-CM | POA: Diagnosis not present

## 2018-07-02 DIAGNOSIS — I480 Paroxysmal atrial fibrillation: Secondary | ICD-10-CM | POA: Diagnosis not present

## 2018-07-02 DIAGNOSIS — F410 Panic disorder [episodic paroxysmal anxiety] without agoraphobia: Secondary | ICD-10-CM | POA: Diagnosis not present

## 2018-07-02 DIAGNOSIS — I110 Hypertensive heart disease with heart failure: Secondary | ICD-10-CM | POA: Diagnosis not present

## 2018-07-02 DIAGNOSIS — I5032 Chronic diastolic (congestive) heart failure: Secondary | ICD-10-CM | POA: Diagnosis not present

## 2018-07-02 DIAGNOSIS — R300 Dysuria: Secondary | ICD-10-CM | POA: Diagnosis not present

## 2018-07-03 DIAGNOSIS — E119 Type 2 diabetes mellitus without complications: Secondary | ICD-10-CM | POA: Diagnosis not present

## 2018-07-03 DIAGNOSIS — I110 Hypertensive heart disease with heart failure: Secondary | ICD-10-CM | POA: Diagnosis not present

## 2018-07-03 DIAGNOSIS — R0689 Other abnormalities of breathing: Secondary | ICD-10-CM | POA: Diagnosis not present

## 2018-07-03 DIAGNOSIS — E876 Hypokalemia: Secondary | ICD-10-CM | POA: Diagnosis not present

## 2018-07-03 DIAGNOSIS — I5032 Chronic diastolic (congestive) heart failure: Secondary | ICD-10-CM | POA: Diagnosis not present

## 2018-07-03 DIAGNOSIS — I5033 Acute on chronic diastolic (congestive) heart failure: Secondary | ICD-10-CM | POA: Diagnosis not present

## 2018-07-03 DIAGNOSIS — K5900 Constipation, unspecified: Secondary | ICD-10-CM | POA: Diagnosis not present

## 2018-07-03 DIAGNOSIS — F410 Panic disorder [episodic paroxysmal anxiety] without agoraphobia: Secondary | ICD-10-CM | POA: Diagnosis not present

## 2018-07-03 DIAGNOSIS — Z8679 Personal history of other diseases of the circulatory system: Secondary | ICD-10-CM | POA: Diagnosis not present

## 2018-07-03 DIAGNOSIS — I480 Paroxysmal atrial fibrillation: Secondary | ICD-10-CM | POA: Diagnosis not present

## 2018-07-03 DIAGNOSIS — E1169 Type 2 diabetes mellitus with other specified complication: Secondary | ICD-10-CM | POA: Diagnosis not present

## 2018-07-03 DIAGNOSIS — R300 Dysuria: Secondary | ICD-10-CM | POA: Diagnosis not present

## 2018-07-03 DIAGNOSIS — R5381 Other malaise: Secondary | ICD-10-CM | POA: Diagnosis not present

## 2018-07-03 DIAGNOSIS — F418 Other specified anxiety disorders: Secondary | ICD-10-CM | POA: Diagnosis not present

## 2018-07-04 DIAGNOSIS — I5033 Acute on chronic diastolic (congestive) heart failure: Secondary | ICD-10-CM | POA: Diagnosis not present

## 2018-07-05 DIAGNOSIS — I5032 Chronic diastolic (congestive) heart failure: Secondary | ICD-10-CM | POA: Diagnosis not present

## 2018-07-05 DIAGNOSIS — I110 Hypertensive heart disease with heart failure: Secondary | ICD-10-CM | POA: Diagnosis not present

## 2018-07-05 DIAGNOSIS — R300 Dysuria: Secondary | ICD-10-CM | POA: Diagnosis not present

## 2018-07-05 DIAGNOSIS — E119 Type 2 diabetes mellitus without complications: Secondary | ICD-10-CM | POA: Diagnosis not present

## 2018-07-05 DIAGNOSIS — F410 Panic disorder [episodic paroxysmal anxiety] without agoraphobia: Secondary | ICD-10-CM | POA: Diagnosis not present

## 2018-07-05 DIAGNOSIS — I480 Paroxysmal atrial fibrillation: Secondary | ICD-10-CM | POA: Diagnosis not present

## 2018-07-06 DIAGNOSIS — F410 Panic disorder [episodic paroxysmal anxiety] without agoraphobia: Secondary | ICD-10-CM | POA: Diagnosis not present

## 2018-07-06 DIAGNOSIS — I110 Hypertensive heart disease with heart failure: Secondary | ICD-10-CM | POA: Diagnosis not present

## 2018-07-06 DIAGNOSIS — I5032 Chronic diastolic (congestive) heart failure: Secondary | ICD-10-CM | POA: Diagnosis not present

## 2018-07-06 DIAGNOSIS — I480 Paroxysmal atrial fibrillation: Secondary | ICD-10-CM | POA: Diagnosis not present

## 2018-07-06 DIAGNOSIS — R300 Dysuria: Secondary | ICD-10-CM | POA: Diagnosis not present

## 2018-07-06 DIAGNOSIS — Z20818 Contact with and (suspected) exposure to other bacterial communicable diseases: Secondary | ICD-10-CM | POA: Diagnosis not present

## 2018-07-06 DIAGNOSIS — E119 Type 2 diabetes mellitus without complications: Secondary | ICD-10-CM | POA: Diagnosis not present

## 2018-07-09 DIAGNOSIS — I480 Paroxysmal atrial fibrillation: Secondary | ICD-10-CM | POA: Diagnosis not present

## 2018-07-09 DIAGNOSIS — F410 Panic disorder [episodic paroxysmal anxiety] without agoraphobia: Secondary | ICD-10-CM | POA: Diagnosis not present

## 2018-07-09 DIAGNOSIS — R300 Dysuria: Secondary | ICD-10-CM | POA: Diagnosis not present

## 2018-07-09 DIAGNOSIS — I110 Hypertensive heart disease with heart failure: Secondary | ICD-10-CM | POA: Diagnosis not present

## 2018-07-09 DIAGNOSIS — I5032 Chronic diastolic (congestive) heart failure: Secondary | ICD-10-CM | POA: Diagnosis not present

## 2018-07-09 DIAGNOSIS — E119 Type 2 diabetes mellitus without complications: Secondary | ICD-10-CM | POA: Diagnosis not present

## 2018-07-10 DIAGNOSIS — I480 Paroxysmal atrial fibrillation: Secondary | ICD-10-CM | POA: Diagnosis not present

## 2018-07-10 DIAGNOSIS — I5032 Chronic diastolic (congestive) heart failure: Secondary | ICD-10-CM | POA: Diagnosis not present

## 2018-07-10 DIAGNOSIS — E119 Type 2 diabetes mellitus without complications: Secondary | ICD-10-CM | POA: Diagnosis not present

## 2018-07-10 DIAGNOSIS — R300 Dysuria: Secondary | ICD-10-CM | POA: Diagnosis not present

## 2018-07-10 DIAGNOSIS — F410 Panic disorder [episodic paroxysmal anxiety] without agoraphobia: Secondary | ICD-10-CM | POA: Diagnosis not present

## 2018-07-10 DIAGNOSIS — I110 Hypertensive heart disease with heart failure: Secondary | ICD-10-CM | POA: Diagnosis not present

## 2018-07-11 DIAGNOSIS — I480 Paroxysmal atrial fibrillation: Secondary | ICD-10-CM | POA: Diagnosis not present

## 2018-07-11 DIAGNOSIS — I5032 Chronic diastolic (congestive) heart failure: Secondary | ICD-10-CM | POA: Diagnosis not present

## 2018-07-11 DIAGNOSIS — F410 Panic disorder [episodic paroxysmal anxiety] without agoraphobia: Secondary | ICD-10-CM | POA: Diagnosis not present

## 2018-07-11 DIAGNOSIS — I110 Hypertensive heart disease with heart failure: Secondary | ICD-10-CM | POA: Diagnosis not present

## 2018-07-11 DIAGNOSIS — R300 Dysuria: Secondary | ICD-10-CM | POA: Diagnosis not present

## 2018-07-11 DIAGNOSIS — E119 Type 2 diabetes mellitus without complications: Secondary | ICD-10-CM | POA: Diagnosis not present

## 2018-07-12 DIAGNOSIS — E119 Type 2 diabetes mellitus without complications: Secondary | ICD-10-CM | POA: Diagnosis not present

## 2018-07-12 DIAGNOSIS — I5032 Chronic diastolic (congestive) heart failure: Secondary | ICD-10-CM | POA: Diagnosis not present

## 2018-07-12 DIAGNOSIS — F410 Panic disorder [episodic paroxysmal anxiety] without agoraphobia: Secondary | ICD-10-CM | POA: Diagnosis not present

## 2018-07-12 DIAGNOSIS — I110 Hypertensive heart disease with heart failure: Secondary | ICD-10-CM | POA: Diagnosis not present

## 2018-07-12 DIAGNOSIS — I480 Paroxysmal atrial fibrillation: Secondary | ICD-10-CM | POA: Diagnosis not present

## 2018-07-12 DIAGNOSIS — R300 Dysuria: Secondary | ICD-10-CM | POA: Diagnosis not present

## 2018-07-13 ENCOUNTER — Emergency Department (HOSPITAL_COMMUNITY): Payer: Medicare Other

## 2018-07-13 ENCOUNTER — Encounter (HOSPITAL_COMMUNITY): Payer: Self-pay

## 2018-07-13 ENCOUNTER — Emergency Department (HOSPITAL_COMMUNITY)
Admission: EM | Admit: 2018-07-13 | Discharge: 2018-07-14 | Disposition: A | Discharge status: Home / Self Care | Payer: Medicare Other | Attending: Emergency Medicine | Admitting: Emergency Medicine

## 2018-07-13 ENCOUNTER — Other Ambulatory Visit: Payer: Self-pay

## 2018-07-13 DIAGNOSIS — Z7901 Long term (current) use of anticoagulants: Secondary | ICD-10-CM | POA: Insufficient documentation

## 2018-07-13 DIAGNOSIS — I110 Hypertensive heart disease with heart failure: Secondary | ICD-10-CM | POA: Insufficient documentation

## 2018-07-13 DIAGNOSIS — Z20828 Contact with and (suspected) exposure to other viral communicable diseases: Secondary | ICD-10-CM | POA: Insufficient documentation

## 2018-07-13 DIAGNOSIS — K5900 Constipation, unspecified: Secondary | ICD-10-CM | POA: Diagnosis not present

## 2018-07-13 DIAGNOSIS — R5381 Other malaise: Secondary | ICD-10-CM | POA: Diagnosis not present

## 2018-07-13 DIAGNOSIS — E278 Other specified disorders of adrenal gland: Secondary | ICD-10-CM | POA: Diagnosis not present

## 2018-07-13 DIAGNOSIS — I5032 Chronic diastolic (congestive) heart failure: Secondary | ICD-10-CM | POA: Insufficient documentation

## 2018-07-13 DIAGNOSIS — E119 Type 2 diabetes mellitus without complications: Secondary | ICD-10-CM | POA: Diagnosis not present

## 2018-07-13 DIAGNOSIS — R1084 Generalized abdominal pain: Secondary | ICD-10-CM | POA: Diagnosis not present

## 2018-07-13 DIAGNOSIS — Z79899 Other long term (current) drug therapy: Secondary | ICD-10-CM | POA: Insufficient documentation

## 2018-07-13 DIAGNOSIS — R0602 Shortness of breath: Secondary | ICD-10-CM | POA: Diagnosis not present

## 2018-07-13 DIAGNOSIS — Z85828 Personal history of other malignant neoplasm of skin: Secondary | ICD-10-CM | POA: Insufficient documentation

## 2018-07-13 LAB — COMPREHENSIVE METABOLIC PANEL
ALT: 33 U/L (ref 0–44)
AST: 28 U/L (ref 15–41)
Albumin: 3.8 g/dL (ref 3.5–5.0)
Alkaline Phosphatase: 121 U/L (ref 38–126)
Anion gap: 8 (ref 5–15)
BUN: 16 mg/dL (ref 8–23)
CO2: 30 mmol/L (ref 22–32)
Calcium: 9.4 mg/dL (ref 8.9–10.3)
Chloride: 100 mmol/L (ref 98–111)
Creatinine, Ser: 0.93 mg/dL (ref 0.44–1.00)
GFR calc Af Amer: 59 mL/min — ABNORMAL LOW (ref >60)
GFR calc non Af Amer: 51 mL/min — ABNORMAL LOW (ref >60)
Glucose, Bld: 117 mg/dL — ABNORMAL HIGH (ref 70–99)
Potassium: 4.3 mmol/L (ref 3.5–5.1)
Sodium: 138 mmol/L (ref 135–145)
Total Bilirubin: 0.6 mg/dL (ref 0.3–1.2)
Total Protein: 7 g/dL (ref 6.5–8.1)

## 2018-07-13 LAB — CBC WITH DIFFERENTIAL/PLATELET
Abs Immature Granulocytes: 0.14 10*3/uL — ABNORMAL HIGH (ref 0.00–0.07)
Basophils Absolute: 0 10*3/uL (ref 0.0–0.1)
Basophils Relative: 0 %
Eosinophils Absolute: 0.1 10*3/uL (ref 0.0–0.5)
Eosinophils Relative: 1 %
HCT: 40.8 % (ref 36.0–46.0)
Hemoglobin: 12.8 g/dL (ref 12.0–15.0)
Immature Granulocytes: 1 %
Lymphocytes Relative: 17 %
Lymphs Abs: 2.3 10*3/uL (ref 0.7–4.0)
MCH: 30.5 pg (ref 26.0–34.0)
MCHC: 31.4 g/dL (ref 30.0–36.0)
MCV: 97.4 fL (ref 80.0–100.0)
Monocytes Absolute: 1 10*3/uL (ref 0.1–1.0)
Monocytes Relative: 8 %
Neutro Abs: 9.8 10*3/uL — ABNORMAL HIGH (ref 1.7–7.7)
Neutrophils Relative %: 73 %
Platelets: 237 10*3/uL (ref 150–400)
RBC: 4.19 MIL/uL (ref 3.87–5.11)
RDW: 14.5 % (ref 11.5–15.5)
WBC: 13.5 10*3/uL — ABNORMAL HIGH (ref 4.0–10.5)
nRBC: 0 % (ref 0.0–0.2)

## 2018-07-13 LAB — TROPONIN I: Troponin I: <0.03 ng/mL (ref <0.03)

## 2018-07-13 LAB — LIPASE, BLOOD: Lipase: 33 U/L (ref 11–51)

## 2018-07-13 LAB — SARS CORONAVIRUS 2 BY RT PCR (HOSPITAL ORDER, PERFORMED IN ~~LOC~~ HOSPITAL LAB): SARS Coronavirus 2: NEGATIVE

## 2018-07-13 LAB — BRAIN NATRIURETIC PEPTIDE: B Natriuretic Peptide: 146 pg/mL — ABNORMAL HIGH (ref 0.0–100.0)

## 2018-07-13 MED ORDER — IOHEXOL 300 MG/ML  SOLN
100.0000 mL | Freq: Once | INTRAMUSCULAR | Status: AC | PRN
  Administered 2018-07-13: 100 mL via INTRAVENOUS

## 2018-07-13 MED ORDER — SODIUM CHLORIDE (PF) 0.9 % IJ SOLN
INTRAMUSCULAR | Status: AC
  Filled 2018-07-13: qty 50

## 2018-07-13 NOTE — ED Notes (Signed)
Patient aware we need urine sample, will call out when ready to void.  

## 2018-07-13 NOTE — ED Provider Notes (Signed)
[facility] DEPT Provider Note   CSN: [id number] Arrival date & time: 07/13/18  1707    History   Chief Complaint Chief Complaint  Patient presents with   Constipation    HPI Jo [name] is a 85 y.o. female.     Jo [name] is a 85 y.o. female with history of with a history of paroxysmal A. fib, CHF, hypertension, diabetes, osteoporosis, who presents to the emergency department for evaluation of constipation and abdominal pain.  Patient was recently admitted to the hospital on 5/30 for constipation and CHF exacerbation.  She returns today reporting constipation for the past 5 days.  History is limited by the patient but her daughter who is been caring for her 24/7 at home is able to provide more information.  Reports that since discharge from the hospital after recent admission she had been doing well overall although requiring 24/7 care at home, over the past 5 days she has had persistent constipation.  Daughter reports they have tried everything at home, MiraLAX, fiber supplements, probiotics, walking, abdominal massage and despite all of these efforts she has been unable to have a bowel movement and has been growing increasingly uncomfortable with abdominal distention and decreased appetite" intermittent generalized abdominal pains.  She has not had any vomiting.  No fevers or chills.  Patient reports mild shortness of breath, after recent hospital admission went home on oxygen and has been care for home health, they have continues to use oxygen at home.  He has not had cough or fevers.  Denies swelling in her legs and has been continuing to take her medications as directed.  Patient's daughter also expresses concern about the left ear she been requiring at home, they have been working with a Education officer, [facility] through [facility] to obtain placement, they had wanted to avoid placement given current COVID-19 pandemic initially but daughter reports  herself and her brother having a hard time providing the level of care that she needs at home and would like to place patient for more help.       Past Medical History:  Diagnosis Date   Anxiety    Arthritis    "hands; between shoulders; back; feet"   Chronic anticoagulation    -->Eliquis   Chronic diastolic CHF (congestive heart failure) ([place])    a. 06/2011 Echo: EF 60-65%.   Depression    Essential hypertension    High risk medication use    on Flecainide   Kidney stone    passed in [place] 2014   Moderate aortic insufficiency    a. 06/2011 Echo: EF 60-65%, mild LVH, no rwma, mod AI.   Osteoporosis    Osteoporosis    PAF (paroxysmal atrial fibrillation) (HCC)    a. chronic flecainide->reduced to 25 bid 08/2014.   Panic attacks    "since losing husband 1994"   Skin cancer ?1960's   "between breasts"   SSS (sick sinus syndrome) (HCC)    a. s/p pacemaker. b. s/p Medtronic gen change 07/2011; c.    Type II diabetes mellitus ([place])    "controlled w/diet"    Patient Active Problem List   Diagnosis Date Noted   Acute on chronic diastolic (congestive) heart failure ([place]) 06/24/2018   Shortness of breath 06/23/2018   Acute on chronic diastolic heart failure ([place]) 06/23/2018   Atrial fibrillation with rapid ventricular response ([place]) 06/04/2017   UTI (urinary tract infection) 08/30/2016   Weakness 08/29/2016   Acute lower  UTI 08/29/2016   Abdominal distention    CAP (community acquired pneumonia) 06/27/2015   Sepsis ([place]) 06/27/2015   Pacemaker 06/27/2015   Moderate aortic insufficiency    Chronic diastolic CHF (congestive heart failure) ([place])    Breast cancer ([place]) 02/03/2014   Dyslipidemia 08/15/2012   Atrophic vaginitis 08/15/2012   Constipation 08/15/2012   Vitamin D intoxication 08/15/2012   Acute on chronic diastolic HF (heart failure) ([place]) 07/06/2012   Atrial fibrillation with RVR ([place]) 07/21/2011   Diabetes mellitus, type II (HCC)     Panic attacks    Anxiety    PAF (paroxysmal atrial fibrillation) (HCC)    SSS (sick sinus syndrome) ([place])    History of aortic insufficiency    Hypertension     Past Surgical History:  Procedure Laterality Date   APPENDECTOMY  1955   BREAST CYST EXCISION  ~ 1950   right   BREAST LUMPECTOMY WITH NEEDLE LOCALIZATION Bilateral 02/03/2014   Procedure: BILATERAL BREAST LUMPECTOMY WITH NEEDLE LOCALIZATION ON LEFT;  Surgeon: Excell Seltzer, MD;  Location: [facility];  Service: General;  Laterality: Bilateral;   CARDIOVASCULAR STRESS TEST  12/09/2004   CATARACT EXTRACTION W/ INTRAOCULAR LENS  IMPLANT, BILATERAL  1990's   [place]   INSERT / REPLACE / REMOVE PACEMAKER  03/16/2006   PERMANENT PACEMAKER GENERATOR CHANGE  08/03/2011   Procedure: PERMANENT PACEMAKER GENERATOR CHANGE;  Surgeon: Thompson Grayer, MD;  Location: [facility] CATH LAB;  Service: Cardiovascular;;   SKIN CANCER EXCISION  ?1960's   "between my breasts"   TONSILLECTOMY     "school age"     OB History   No obstetric history on file.      Home Medications    Prior to Admission medications   Medication Sig Start Date End Date Taking? Authorizing Provider  acetaminophen (TYLENOL) 500 MG tablet Take 500-1,000 mg by mouth every 6 (six) hours as needed for mild pain, moderate pain, fever or headache.   Yes [provider]  amiodarone (PACERONE) 200 MG tablet TAKE 1 TABLET BY MOUTH EVERY DAY Patient taking differently: Take 200 mg by mouth daily.  02/21/18  Yes Sherran Needs, NP  apixaban (ELIQUIS) 2.5 MG TABS tablet Take 1 tablet (2.5 mg total) by mouth 2 (two) times daily. 01/28/14  Yes Martinique, Peter M, MD  azelastine (ASTELIN) 0.1 % nasal spray Place 1 spray into both nostrils daily as needed for rhinitis. Use in each nostril as directed   Yes [provider]  carvedilol (COREG) 6.25 MG tablet Take 6.25 mg by mouth 2 (two) times daily with a  meal. Hold for SBP<110 OR HR <60   Yes [provider]  CRESTOR 5 MG tablet Take 2.5 mg by mouth every Monday, Wednesday, and Friday.  07/25/14  Yes [provider]  diltiazem (CARDIZEM CD) 240 MG 24 hr capsule Take 240 mg by mouth daily.  09/16/14  Yes [provider]  estradiol (ESTRACE) 0.1 MG/GM vaginal cream Place 1 Applicatorful vaginally every Monday, Wednesday, and Friday.   Yes [provider]  feeding supplement, ENSURE ENLIVE, (ENSURE ENLIVE) LIQD Take 237 mLs by mouth 2 (two) times daily between meals. Patient taking differently: Take 237 mLs by mouth 2 (two) times daily as needed (nutrition).  06/07/17  Yes Ghimire, Henreitta Leber, MD  furosemide (LASIX) 40 MG tablet Take 1 tablet (40 mg total) by mouth daily for 30 days. 06/28/18 07/28/18 Yes Shelly Coss, MD  JANUVIA 100 MG tablet Take 100 mg by mouth daily. 08/12/17  Yes [provider]  LORazepam (ATIVAN) 0.5 MG tablet Take 0.5-1 tablets (0.25-0.5 mg total) by mouth See admin instructions. Take 0.25 mg tablet in the morning and afternoon, then 0.5 mg at bedtime 06/07/17  Yes Ghimire, Henreitta Leber, MD  polyethylene glycol (MIRALAX / GLYCOLAX) packet Take 17 g by mouth 2 (two) times daily as needed for mild constipation.    Yes [provider]  potassium chloride SA (K-DUR) 20 MEQ tablet Take 1 tablet (20 mEq total) by mouth daily. 06/27/18  Yes Shelly Coss, MD  sertraline (ZOLOFT) 50 MG tablet Take 50 mg by mouth daily. 07/20/14  Yes [provider]  Vitamin D, Ergocalciferol, (DRISDOL) 50000 UNITS CAPS capsule Take 50,000 Units by mouth every Wednesday.   Yes [provider]    Family History Family History  Problem Relation Age of Onset   Emphysema Father     Social History Social History   Tobacco Use   Smoking status: Never Smoker   Smokeless tobacco: Never Used  Substance Use Topics   Alcohol use: No   Drug use: No     Allergies   Sulfonamide  derivatives and Citalopram hydrobromide   Review of Systems Review of Systems  Constitutional: Positive for appetite change. Negative for chills and fever.  HENT: Negative.   Eyes: Negative for visual disturbance.  Respiratory: Positive for shortness of breath. Negative for cough and chest tightness.   Cardiovascular: Negative for chest pain and leg swelling.  Gastrointestinal: Positive for abdominal pain and constipation. Negative for nausea and vomiting.  Genitourinary: Negative for dysuria.  Neurological: Negative for dizziness, syncope, weakness and light-headedness.  All other systems reviewed and are negative.    Physical Exam Updated Vital Signs BP (!) 131/51 (BP Location: Left Arm)    Pulse 63    Temp 97.9 F (36.6 C) (Oral)    Resp 16    Ht 5' (1.524 m)    Wt 58.2 kg    SpO2 94%    BMI 25.07 kg/m   Physical Exam Vitals signs and nursing note reviewed.  Constitutional:      General: She is not in acute distress.    Appearance: She is well-developed and normal weight. She is not ill-appearing or diaphoretic.  HENT:     Head: Normocephalic and atraumatic.     Mouth/Throat:     Mouth: Mucous membranes are moist.     Pharynx: Oropharynx is clear.  Eyes:     General:        Right eye: No discharge.        Left eye: No discharge.     Pupils: Pupils are equal, round, and reactive to light.  Neck:     Musculoskeletal: Neck supple.  Cardiovascular:     Rate and Rhythm: Normal rate and regular rhythm.     Pulses: Normal pulses.          Radial pulses are 2+ on the right side and 2+ on the left side.       Dorsalis pedis pulses are 2+ on the right side and 2+ on the left side.     Heart sounds: Normal heart sounds. No murmur. No friction rub. No gallop.   Pulmonary:     Effort: Pulmonary effort is normal. No respiratory distress.     Breath sounds: Rhonchi present. No wheezing or rales.     Comments: Respirations equal and unlabored, patient able  to speak in full  sentences, lungs with some scattered rhonchi throughout, but with good air movement, no other adventitious lung sounds. Abdominal:     General: Bowel sounds are normal. There is distension.     Palpations: Abdomen is soft. There is no mass.     Tenderness: There is abdominal tenderness. There is no guarding.     Comments: Abdomen is soft, mildly distended, there is some mild generalized tenderness throughout but no guarding or rebound tenderness.  No CVA tenderness.  Musculoskeletal:        General: No deformity.     Comments: Bilateral lower extremities warm and well perfused without edema.  Skin:    General: Skin is warm and dry.     Capillary Refill: Capillary refill takes less than 2 seconds.  Neurological:     Mental Status: She is alert.     Coordination: Coordination normal.     Comments: Speech is clear, able to follow commands Moves extremities without ataxia, coordination intact  Psychiatric:        Mood and Affect: Mood normal.        Behavior: Behavior normal.      ED Treatments / Results  Labs (all labs ordered are listed, but only abnormal results are displayed) Labs Reviewed  COMPREHENSIVE METABOLIC PANEL - Abnormal; Notable for the following components:      Result Value   Glucose, Bld 117 (*)    GFR calc non Af Amer 51 (*)    GFR calc Af Amer 59 (*)    All other components within normal limits  CBC WITH DIFFERENTIAL/PLATELET - Abnormal; Notable for the following components:   WBC 13.5 (*)    Neutro Abs 9.8 (*)    Abs Immature Granulocytes 0.14 (*)    All other components within normal limits  BRAIN NATRIURETIC PEPTIDE - Abnormal; Notable for the following components:   B Natriuretic Peptide 146.0 (*)    All other components within normal limits  SARS CORONAVIRUS 2 (HOSPITAL ORDER, [facility])  LIPASE, BLOOD  TROPONIN I  URINALYSIS, ROUTINE W REFLEX MICROSCOPIC    EKG EKG Interpretation  Date/Time:  Friday July 13 2018  18:32:32 EDT Ventricular Rate:  61 PR Interval:    QRS Duration: 94 QT Interval:  472 QTC Calculation: 476 R Axis:   -3 Text Interpretation:  Atrial-paced rhythm similar to prior 5/20 Confirmed by Aletta Edouard [phone]) on 07/13/2018 6:41:34 PM   Radiology Ct Abdomen Pelvis W Contrast  Result Date: 07/13/2018 CLINICAL DATA:  Pain and distension EXAM: CT ABDOMEN AND PELVIS WITH CONTRAST TECHNIQUE: Multidetector CT imaging of the abdomen and pelvis was performed using the standard protocol following bolus administration of intravenous contrast. CONTRAST:  135mL OMNIPAQUE IOHEXOL 300 MG/ML  SOLN COMPARISON:  CT 06/23/2018, 07/06/2012 FINDINGS: Lower chest: Lung bases demonstrate partial atelectasis at the lingula. No acute consolidation or effusion. Mild cardiomegaly. Partially visualized intracardiac pacing leads. Small hiatal hernia. Hepatobiliary: No calcified gallstone. Slight intra hepatic biliary dilatation. Mildly prominent extrahepatic common bile duct up to 8 mm. Peripherally enhancing mass near the falciform ligament measuring 2.3 cm, probably stable to 2014. Pancreas: Unremarkable. No pancreatic ductal dilatation or surrounding inflammatory changes. Spleen: Normal in size without focal abnormality. Adrenals/Urinary Tract: 2.9 cm left adrenal adenoma. 5.7 cm right adrenal myelolipoma. Cortical scarring left kidney. No hydronephrosis. The bladder is unremarkable Stomach/Bowel: The stomach is nonenlarged. No dilated small bowel. Large amount of stool in the colon. No colon wall thickening.  Status post appendectomy. Vascular/Lymphatic: Moderate aortic atherosclerosis without aneurysm. No significantly enlarged lymph nodes. Reproductive: Uterus and bilateral adnexa are unremarkable. Other: Negative for free air or free fluid. Musculoskeletal: Acute or subacute appearing compression fracture of L2 with lucency involving both superior and inferior endplates as well as the vertebral body. 4 mm  retropulsion. About 30% loss of height centrally of the vertebral body, increased compared to CT from [place] 2020. IMPRESSION: 1. No CT evidence for acute intra-abdominal or pelvic abnormality. Negative for bowel obstruction. Large volume of stool in the colon. No evidence for colon wall thickening. Moderate formed feces at the rectum. 2. Acute to subacute two column fracture involving L2 with 4 mm of retropulsion. Further loss of the vertebral body height 3. Stable bilateral adrenal masses 4. 2.3 cm peripherally enhancing mass near the falciform ligament, possible hemangioma and likely stable to 2014. Electronically Signed   By: Donavan Foil M.D.   On: 07/13/2018 21:57   Dg Chest Port 1 View  Result Date: 07/13/2018 CLINICAL DATA:  Shortness of breath.  Constipation. EXAM: PORTABLE CHEST 1 VIEW COMPARISON:  06/23/2018 FINDINGS: The pacer wires are stable. The cardiac silhouette, mediastinal and hilar contours are within normal limits and stable. The lungs are clear. Stable mild eventration of the right hemidiaphragm. No pleural effusions. The bony thorax is intact. IMPRESSION: No acute cardiopulmonary findings. Electronically Signed   By: Marijo Sanes M.D.   On: 07/13/2018 18:49    Procedures Procedures (including critical care time)  Medications Ordered in ED Medications  sodium chloride (PF) 0.9 % injection (has no administration in time range)  iohexol (OMNIPAQUE) 300 MG/ML solution 100 mL (100 mLs Intravenous Contrast Given 07/13/18 2115)     Initial Impression / Assessment and Plan / ED Course  I have reviewed the triage vital signs and the nursing notes.  Pertinent labs & imaging results that were available during my care of the patient were reviewed by me and considered in my medical decision making (see chart for details).  Patient presents with constipation unable to have bowel movement for the last 5 days now with some abdominal pain and decreased appetite.  She also had recent  admission for heart failure exacerbation, reports some mild shortness of breath and rhonchi on exam.  Normal labs and abdominal CT.  No pathologic cause of constipation otherwise we will plan enema for help with bowels.  We will also check chest x-ray, troponin, BNP troponin given shortness of breath with recent heart failure exacerbation although I am reassured that patient is not requiring oxygen here in the ED and she overall does not look fluid overloaded with no lower extremity edema.  Because daughter also expressed entrance into placement I have also discussed with our social work here so that they can see where patient is at with placement at [facility], patient's home health social worker has been working on this.  Discussed with Jonnie Finner LCSW, who reports that patient has something requiring medical admission and they can likely work on placement over the next 2 days but otherwise patient will need to be discharged back to continue to work on outpatient placement over the next few days.  Social work discussed this with the patient's daughter who is in agreement.  Work-up is overall been reassuring.  EKG unremarkable and troponin negative.  BNP is decreased compared to prior and chest x-ray is improved.  Patient with slight leukocytosis but afebrile, normal hemoglobin, no acute electrolyte derangements and normal renal liver  function, normal lipase.  Urinalysis without signs of infection.  COVID test negative.  CT shows a large amount of stool within the colon but no evidence of obstruction.  We will plan for soapsuds enema here in the emergency department and hopefully this will help reduce bowel movement.  Likely subacute compression fracture noted patient has not had any recent falls or trauma.  Liver mass noted which is been stable since 2014.  After soapsuds enema patient able to have a large bowel movement and now reports she is much more comfortable.  I have called and updated  patient's daughter on reassuring work-up, will have her continue with home bowel regimen now that we have been able to get bowels moving again.  The ED.  Will have home health continue to work on placement from an outpatient standpoint.  At this time patient is stable for discharge home patient's daughter will come to pick the patient up.  Return precautions discussed.  Family expresses understanding and agreement with this plan.  Final Clinical Impressions(s) / ED Diagnoses   Final diagnoses:  Constipation, unspecified constipation type  Generalized abdominal pain    ED Discharge Orders    None       Janet Berlin 07/17/18 1601    Hayden Rasmussen, MD 07/18/18 [phone]

## 2018-07-13 NOTE — ED Notes (Signed)
Bed: YC14 Expected date:  Expected time:  Means of arrival:  Comments: Being zapped

## 2018-07-13 NOTE — ED Notes (Signed)
About 750 mL of soap suds enema given, patient assisted onto bedside commode.

## 2018-07-13 NOTE — TOC Initial Note (Signed)
Transition of Care [facility]) - Initial/Assessment Note    Patient Details  Name: Jo [name] MRN: [id number] Date of Birth: Mar 02, 1917  Transition of Care [facility]) CM/SW Contact:    Erenest Rasher, RN Phone Number: 07/13/2018, 6:38 PM  Clinical Narrative:                 Stasia Cavalier, [facility] [place] CSW # [phone]. CSW states they will work on SNF placement if pt dc to home this evening. Will need HH orders for [facility], PT, aide and SW with F2F.   Contacted Dtr, [name] and states she prefers [facility]. Pt has a qualifying 3 IP stay from previous hospital stay 06/24/2018 and she is apart of [facility] ACO (no 3 IP night needed). Will continue to follow for dc needs. If pt not dc, TOC CSW will work up for SNF, will need PT evaluation. Updated ED attending.   Expected Discharge Plan: Skilled Nursing Facility Barriers to Discharge: Continued Medical Work up   Patient Goals and CMS Choice Patient states their goals for this hospitalization and ongoing recovery are:: will need care and rehab CMS Medicare.gov Compare Post Acute Care list provided to:: Patient Represenative (must comment)(Brenda Melina Copa) Choice offered to / list presented to : Adult Children  Expected Discharge Plan and Services Expected Discharge Plan: [place] In-house Referral: Clinical Social Work Discharge Planning Services: CM Consult Post Acute Care Choice: [place] Living arrangements for the past 2 months: Single Family Home                           HH Arranged: RN, PT, Nurse's Aide, Social Work [employer] Agency: [facility]        Prior Living Arrangements/Services Living arrangements for the past 2 months: [place] with:: Self Patient language and need for interpreter reviewed:: Yes Do you feel safe going back to the place where you live?: No      Need for Family Participation in Patient Care: Yes (Comment) Care giver support system in place?: Yes  (comment) Current home services: DME(medical alert, oxygen ([place])) Criminal Activity/Legal Involvement Pertinent to Current Situation/Hospitalization: No - Comment as needed  Activities of Daily Living      Permission Sought/Granted Permission sought to share information with : Case Manager, PCP, Customer service manager, Family Supports Permission granted to share information with : Yes, Verbal Permission Granted  Share Information with NAME: Chrystie Nose  Permission granted to share info w AGENCY: [facility], [employer] granted to share info w Relationship: daughter, son  Permission granted to share info w Contact Information: [phone]  Emotional Assessment       Orientation: : Oriented to Self, Oriented to Place   Psych Involvement: No (comment)  Admission diagnosis:  Constipation  Patient Active Problem List   Diagnosis Date Noted  . Acute on chronic diastolic (congestive) heart failure ([place]) 06/24/2018  . Shortness of breath 06/23/2018  . Acute on chronic diastolic heart failure ([place]) 06/23/2018  . Atrial fibrillation with rapid ventricular response ([place]) 06/04/2017  . UTI (urinary tract infection) 08/30/2016  . Weakness 08/29/2016  . Acute lower UTI 08/29/2016  . Abdominal distention   . CAP (community acquired pneumonia) 06/27/2015  . Sepsis ([place]) 06/27/2015  . Pacemaker 06/27/2015  . Moderate aortic insufficiency   . Chronic diastolic CHF (congestive heart failure) ([place])   . Breast cancer ([place]) 02/03/2014  . Dyslipidemia 08/15/2012  . Atrophic  vaginitis 08/15/2012  . Constipation 08/15/2012  . Vitamin D intoxication 08/15/2012  . Acute on chronic diastolic HF (heart failure) ([place]) 07/06/2012  . Atrial fibrillation with RVR ([place]) 07/21/2011  . Diabetes mellitus, type II ([place])   . Panic attacks   . Anxiety   . PAF (paroxysmal atrial fibrillation) ([place])   . SSS (sick sinus syndrome) ([place])   . History of aortic insufficiency    . Hypertension    PCP:  Crist Infante, MD Pharmacy:   [id number] - [place], [place] - [address] [place] [address] Phone: [phone] Fax: [phone]     Social Determinants of Health (SDOH) Interventions    Readmission Risk Interventions No flowsheet data found.

## 2018-07-13 NOTE — ED Notes (Signed)
Patient had good results and a moderate sized BM in bedside commode, states she feels much better.

## 2018-07-13 NOTE — Discharge Instructions (Signed)
Please continue with MiraLAX and bowel regimen at home, she was able to have a large bowel movement after enema here in the ED and the rest of her work-up is been very reassuring.  Continue to work with team at [facility] for placement at [facility].  Return for worsening abdominal pain, fevers, vomiting, blood in the stool or any other new or concerning symptoms.

## 2018-07-13 NOTE — ED Triage Notes (Signed)
Patient [facility] from home with complaints of constipation. Patient was D/c'd from the hospital approx 2 weeks ago for the same. Family requested patient be dc'd home to avoid SNF/Nursing home due to Jessie. Patient reports last bowel movement was 5 days ago. Patient denies nausea/vomting. Family attempted multiple home remedies with no relief for the patient. Patient family would like patient placed at [facility] upon D/C from [facility] today.  EMS VS: 106/60, 86 Afib, 18RR, 98% RA

## 2018-07-14 NOTE — ED Notes (Signed)
Georgeanne Nim and [employer] are in a white SUV to pick up the patient.

## 2018-07-16 DIAGNOSIS — I110 Hypertensive heart disease with heart failure: Secondary | ICD-10-CM | POA: Diagnosis not present

## 2018-07-16 DIAGNOSIS — R300 Dysuria: Secondary | ICD-10-CM | POA: Diagnosis not present

## 2018-07-16 DIAGNOSIS — E119 Type 2 diabetes mellitus without complications: Secondary | ICD-10-CM | POA: Diagnosis not present

## 2018-07-16 DIAGNOSIS — I5032 Chronic diastolic (congestive) heart failure: Secondary | ICD-10-CM | POA: Diagnosis not present

## 2018-07-16 DIAGNOSIS — I480 Paroxysmal atrial fibrillation: Secondary | ICD-10-CM | POA: Diagnosis not present

## 2018-07-16 DIAGNOSIS — F410 Panic disorder [episodic paroxysmal anxiety] without agoraphobia: Secondary | ICD-10-CM | POA: Diagnosis not present

## 2018-07-17 DIAGNOSIS — M25551 Pain in right hip: Secondary | ICD-10-CM | POA: Diagnosis not present

## 2018-07-17 DIAGNOSIS — I110 Hypertensive heart disease with heart failure: Secondary | ICD-10-CM | POA: Diagnosis not present

## 2018-07-17 DIAGNOSIS — R300 Dysuria: Secondary | ICD-10-CM | POA: Diagnosis not present

## 2018-07-17 DIAGNOSIS — I480 Paroxysmal atrial fibrillation: Secondary | ICD-10-CM | POA: Diagnosis not present

## 2018-07-17 DIAGNOSIS — F410 Panic disorder [episodic paroxysmal anxiety] without agoraphobia: Secondary | ICD-10-CM | POA: Diagnosis not present

## 2018-07-17 DIAGNOSIS — I5032 Chronic diastolic (congestive) heart failure: Secondary | ICD-10-CM | POA: Diagnosis not present

## 2018-07-17 DIAGNOSIS — E119 Type 2 diabetes mellitus without complications: Secondary | ICD-10-CM | POA: Diagnosis not present

## 2018-07-18 DIAGNOSIS — I110 Hypertensive heart disease with heart failure: Secondary | ICD-10-CM | POA: Diagnosis not present

## 2018-07-18 DIAGNOSIS — I480 Paroxysmal atrial fibrillation: Secondary | ICD-10-CM | POA: Diagnosis not present

## 2018-07-18 DIAGNOSIS — E119 Type 2 diabetes mellitus without complications: Secondary | ICD-10-CM | POA: Diagnosis not present

## 2018-07-18 DIAGNOSIS — I5032 Chronic diastolic (congestive) heart failure: Secondary | ICD-10-CM | POA: Diagnosis not present

## 2018-07-18 DIAGNOSIS — F410 Panic disorder [episodic paroxysmal anxiety] without agoraphobia: Secondary | ICD-10-CM | POA: Diagnosis not present

## 2018-07-18 DIAGNOSIS — R300 Dysuria: Secondary | ICD-10-CM | POA: Diagnosis not present

## 2018-07-19 DIAGNOSIS — I110 Hypertensive heart disease with heart failure: Secondary | ICD-10-CM | POA: Diagnosis not present

## 2018-07-19 DIAGNOSIS — E119 Type 2 diabetes mellitus without complications: Secondary | ICD-10-CM | POA: Diagnosis not present

## 2018-07-19 DIAGNOSIS — F410 Panic disorder [episodic paroxysmal anxiety] without agoraphobia: Secondary | ICD-10-CM | POA: Diagnosis not present

## 2018-07-19 DIAGNOSIS — R300 Dysuria: Secondary | ICD-10-CM | POA: Diagnosis not present

## 2018-07-19 DIAGNOSIS — I5032 Chronic diastolic (congestive) heart failure: Secondary | ICD-10-CM | POA: Diagnosis not present

## 2018-07-19 DIAGNOSIS — I480 Paroxysmal atrial fibrillation: Secondary | ICD-10-CM | POA: Diagnosis not present

## 2018-07-20 DIAGNOSIS — R300 Dysuria: Secondary | ICD-10-CM | POA: Diagnosis not present

## 2018-07-20 DIAGNOSIS — I110 Hypertensive heart disease with heart failure: Secondary | ICD-10-CM | POA: Diagnosis not present

## 2018-07-20 DIAGNOSIS — I480 Paroxysmal atrial fibrillation: Secondary | ICD-10-CM | POA: Diagnosis not present

## 2018-07-20 DIAGNOSIS — E119 Type 2 diabetes mellitus without complications: Secondary | ICD-10-CM | POA: Diagnosis not present

## 2018-07-20 DIAGNOSIS — I5032 Chronic diastolic (congestive) heart failure: Secondary | ICD-10-CM | POA: Diagnosis not present

## 2018-07-20 DIAGNOSIS — F410 Panic disorder [episodic paroxysmal anxiety] without agoraphobia: Secondary | ICD-10-CM | POA: Diagnosis not present

## 2018-07-24 DIAGNOSIS — R300 Dysuria: Secondary | ICD-10-CM | POA: Diagnosis not present

## 2018-07-24 DIAGNOSIS — I110 Hypertensive heart disease with heart failure: Secondary | ICD-10-CM | POA: Diagnosis not present

## 2018-07-24 DIAGNOSIS — F410 Panic disorder [episodic paroxysmal anxiety] without agoraphobia: Secondary | ICD-10-CM | POA: Diagnosis not present

## 2018-07-24 DIAGNOSIS — E119 Type 2 diabetes mellitus without complications: Secondary | ICD-10-CM | POA: Diagnosis not present

## 2018-07-24 DIAGNOSIS — I480 Paroxysmal atrial fibrillation: Secondary | ICD-10-CM | POA: Diagnosis not present

## 2018-07-24 DIAGNOSIS — I5032 Chronic diastolic (congestive) heart failure: Secondary | ICD-10-CM | POA: Diagnosis not present

## 2018-07-25 DIAGNOSIS — I480 Paroxysmal atrial fibrillation: Secondary | ICD-10-CM | POA: Diagnosis not present

## 2018-07-25 DIAGNOSIS — E119 Type 2 diabetes mellitus without complications: Secondary | ICD-10-CM | POA: Diagnosis not present

## 2018-07-25 DIAGNOSIS — F410 Panic disorder [episodic paroxysmal anxiety] without agoraphobia: Secondary | ICD-10-CM | POA: Diagnosis not present

## 2018-07-25 DIAGNOSIS — I110 Hypertensive heart disease with heart failure: Secondary | ICD-10-CM | POA: Diagnosis not present

## 2018-07-25 DIAGNOSIS — I5032 Chronic diastolic (congestive) heart failure: Secondary | ICD-10-CM | POA: Diagnosis not present

## 2018-07-25 DIAGNOSIS — R300 Dysuria: Secondary | ICD-10-CM | POA: Diagnosis not present

## 2018-07-26 DIAGNOSIS — I480 Paroxysmal atrial fibrillation: Secondary | ICD-10-CM | POA: Diagnosis not present

## 2018-07-26 DIAGNOSIS — I5032 Chronic diastolic (congestive) heart failure: Secondary | ICD-10-CM | POA: Diagnosis not present

## 2018-07-26 DIAGNOSIS — F410 Panic disorder [episodic paroxysmal anxiety] without agoraphobia: Secondary | ICD-10-CM | POA: Diagnosis not present

## 2018-07-26 DIAGNOSIS — E119 Type 2 diabetes mellitus without complications: Secondary | ICD-10-CM | POA: Diagnosis not present

## 2018-07-26 DIAGNOSIS — R300 Dysuria: Secondary | ICD-10-CM | POA: Diagnosis not present

## 2018-07-26 DIAGNOSIS — I110 Hypertensive heart disease with heart failure: Secondary | ICD-10-CM | POA: Diagnosis not present

## 2018-08-16 ENCOUNTER — Encounter: Payer: Medicare Other | Admitting: *Deleted

## 2018-08-17 DIAGNOSIS — M545 Low back pain: Secondary | ICD-10-CM | POA: Diagnosis not present

## 2018-08-17 DIAGNOSIS — M1990 Unspecified osteoarthritis, unspecified site: Secondary | ICD-10-CM | POA: Diagnosis not present

## 2018-08-17 DIAGNOSIS — I480 Paroxysmal atrial fibrillation: Secondary | ICD-10-CM | POA: Diagnosis not present

## 2018-08-20 ENCOUNTER — Telehealth: Payer: Self-pay

## 2018-08-20 NOTE — Telephone Encounter (Signed)
Left message for patient to remind of missed remote transmission.  

## 2018-08-21 DIAGNOSIS — R079 Chest pain, unspecified: Secondary | ICD-10-CM | POA: Diagnosis not present

## 2018-08-21 DIAGNOSIS — I959 Hypotension, unspecified: Secondary | ICD-10-CM | POA: Diagnosis not present

## 2018-08-21 DIAGNOSIS — M25551 Pain in right hip: Secondary | ICD-10-CM | POA: Diagnosis not present

## 2018-08-21 DIAGNOSIS — R0902 Hypoxemia: Secondary | ICD-10-CM | POA: Diagnosis not present

## 2018-08-21 DIAGNOSIS — M25572 Pain in left ankle and joints of left foot: Secondary | ICD-10-CM | POA: Diagnosis not present

## 2018-08-22 DIAGNOSIS — M545 Low back pain: Secondary | ICD-10-CM | POA: Diagnosis not present

## 2018-08-22 DIAGNOSIS — I4891 Unspecified atrial fibrillation: Secondary | ICD-10-CM | POA: Diagnosis not present

## 2018-08-22 DIAGNOSIS — Z853 Personal history of malignant neoplasm of breast: Secondary | ICD-10-CM | POA: Diagnosis not present

## 2018-08-22 DIAGNOSIS — M810 Age-related osteoporosis without current pathological fracture: Secondary | ICD-10-CM | POA: Diagnosis not present

## 2018-08-22 DIAGNOSIS — I351 Nonrheumatic aortic (valve) insufficiency: Secondary | ICD-10-CM | POA: Diagnosis not present

## 2018-08-22 DIAGNOSIS — I5033 Acute on chronic diastolic (congestive) heart failure: Secondary | ICD-10-CM | POA: Diagnosis not present

## 2018-08-22 DIAGNOSIS — G8929 Other chronic pain: Secondary | ICD-10-CM | POA: Diagnosis not present

## 2018-08-24 ENCOUNTER — Ambulatory Visit (INDEPENDENT_AMBULATORY_CARE_PROVIDER_SITE_OTHER): Payer: Medicare Other | Admitting: *Deleted

## 2018-08-24 DIAGNOSIS — I495 Sick sinus syndrome: Secondary | ICD-10-CM | POA: Diagnosis not present

## 2018-08-24 DIAGNOSIS — I351 Nonrheumatic aortic (valve) insufficiency: Secondary | ICD-10-CM | POA: Diagnosis not present

## 2018-08-24 DIAGNOSIS — I4891 Unspecified atrial fibrillation: Secondary | ICD-10-CM | POA: Diagnosis not present

## 2018-08-24 DIAGNOSIS — I5033 Acute on chronic diastolic (congestive) heart failure: Secondary | ICD-10-CM | POA: Diagnosis not present

## 2018-08-24 DIAGNOSIS — G8929 Other chronic pain: Secondary | ICD-10-CM | POA: Diagnosis not present

## 2018-08-24 DIAGNOSIS — M810 Age-related osteoporosis without current pathological fracture: Secondary | ICD-10-CM | POA: Diagnosis not present

## 2018-08-24 DIAGNOSIS — M545 Low back pain: Secondary | ICD-10-CM | POA: Diagnosis not present

## 2018-08-24 LAB — CUP PACEART REMOTE DEVICE CHECK
Battery Impedance: 502 Ohm
Battery Remaining Longevity: 88 mo
Battery Voltage: 2.79 V
Brady Statistic AP VP Percent: 0 %
Brady Statistic AP VS Percent: 100 %
Brady Statistic AS VP Percent: 0 %
Brady Statistic AS VS Percent: 0 %
Date Time Interrogation Session: 20200731143220
Implantable Lead Implant Date: 20080221
Implantable Lead Implant Date: 20080221
Implantable Lead Location: 753859
Implantable Lead Location: 753860
Implantable Lead Model: 4076
Implantable Lead Model: 5076
Implantable Pulse Generator Implant Date: 20130710
Lead Channel Impedance Value: 448 Ohm
Lead Channel Impedance Value: 522 Ohm
Lead Channel Pacing Threshold Amplitude: 0.625 V
Lead Channel Pacing Threshold Amplitude: 1.125 V
Lead Channel Pacing Threshold Pulse Width: 0.4 ms
Lead Channel Pacing Threshold Pulse Width: 0.4 ms
Lead Channel Setting Pacing Amplitude: 2 V
Lead Channel Setting Pacing Amplitude: 2.5 V
Lead Channel Setting Pacing Pulse Width: 0.4 ms
Lead Channel Setting Sensing Sensitivity: 2.8 mV

## 2018-08-25 DIAGNOSIS — K56609 Unspecified intestinal obstruction, unspecified as to partial versus complete obstruction: Secondary | ICD-10-CM

## 2018-08-25 HISTORY — DX: Unspecified intestinal obstruction, unspecified as to partial versus complete obstruction: K56.609

## 2018-08-27 ENCOUNTER — Encounter (HOSPITAL_COMMUNITY): Payer: Self-pay | Admitting: Emergency Medicine

## 2018-08-27 ENCOUNTER — Emergency Department (HOSPITAL_COMMUNITY): Payer: Medicare Other

## 2018-08-27 ENCOUNTER — Other Ambulatory Visit: Payer: Self-pay

## 2018-08-27 ENCOUNTER — Inpatient Hospital Stay (HOSPITAL_COMMUNITY)
Admission: EM | Admit: 2018-08-27 | Discharge: 2018-09-02 | DRG: 389 | Disposition: A | Discharge status: Skilled Nursing Facility | Payer: Medicare Other | Attending: Internal Medicine | Admitting: Internal Medicine

## 2018-08-27 DIAGNOSIS — R140 Abdominal distension (gaseous): Secondary | ICD-10-CM | POA: Diagnosis not present

## 2018-08-27 DIAGNOSIS — M19072 Primary osteoarthritis, left ankle and foot: Secondary | ICD-10-CM | POA: Diagnosis present

## 2018-08-27 DIAGNOSIS — Z9049 Acquired absence of other specified parts of digestive tract: Secondary | ICD-10-CM

## 2018-08-27 DIAGNOSIS — K5900 Constipation, unspecified: Secondary | ICD-10-CM | POA: Diagnosis not present

## 2018-08-27 DIAGNOSIS — I351 Nonrheumatic aortic (valve) insufficiency: Secondary | ICD-10-CM | POA: Diagnosis present

## 2018-08-27 DIAGNOSIS — H9190 Unspecified hearing loss, unspecified ear: Secondary | ICD-10-CM | POA: Diagnosis present

## 2018-08-27 DIAGNOSIS — F329 Major depressive disorder, single episode, unspecified: Secondary | ICD-10-CM | POA: Diagnosis present

## 2018-08-27 DIAGNOSIS — E119 Type 2 diabetes mellitus without complications: Secondary | ICD-10-CM

## 2018-08-27 DIAGNOSIS — K56600 Partial intestinal obstruction, unspecified as to cause: Secondary | ICD-10-CM | POA: Diagnosis not present

## 2018-08-27 DIAGNOSIS — F410 Panic disorder [episodic paroxysmal anxiety] without agoraphobia: Secondary | ICD-10-CM | POA: Diagnosis present

## 2018-08-27 DIAGNOSIS — Z20828 Contact with and (suspected) exposure to other viral communicable diseases: Secondary | ICD-10-CM | POA: Diagnosis present

## 2018-08-27 DIAGNOSIS — R2689 Other abnormalities of gait and mobility: Secondary | ICD-10-CM | POA: Diagnosis not present

## 2018-08-27 DIAGNOSIS — Z79890 Hormone replacement therapy (postmenopausal): Secondary | ICD-10-CM

## 2018-08-27 DIAGNOSIS — J90 Pleural effusion, not elsewhere classified: Secondary | ICD-10-CM | POA: Diagnosis not present

## 2018-08-27 DIAGNOSIS — I4891 Unspecified atrial fibrillation: Secondary | ICD-10-CM | POA: Diagnosis not present

## 2018-08-27 DIAGNOSIS — E785 Hyperlipidemia, unspecified: Secondary | ICD-10-CM | POA: Diagnosis present

## 2018-08-27 DIAGNOSIS — I480 Paroxysmal atrial fibrillation: Secondary | ICD-10-CM | POA: Diagnosis present

## 2018-08-27 DIAGNOSIS — Z882 Allergy status to sulfonamides status: Secondary | ICD-10-CM

## 2018-08-27 DIAGNOSIS — K8020 Calculus of gallbladder without cholecystitis without obstruction: Secondary | ICD-10-CM | POA: Diagnosis not present

## 2018-08-27 DIAGNOSIS — I495 Sick sinus syndrome: Secondary | ICD-10-CM | POA: Diagnosis present

## 2018-08-27 DIAGNOSIS — M19041 Primary osteoarthritis, right hand: Secondary | ICD-10-CM | POA: Diagnosis present

## 2018-08-27 DIAGNOSIS — Z79899 Other long term (current) drug therapy: Secondary | ICD-10-CM

## 2018-08-27 DIAGNOSIS — K56609 Unspecified intestinal obstruction, unspecified as to partial versus complete obstruction: Secondary | ICD-10-CM | POA: Diagnosis not present

## 2018-08-27 DIAGNOSIS — R109 Unspecified abdominal pain: Secondary | ICD-10-CM | POA: Diagnosis not present

## 2018-08-27 DIAGNOSIS — Z950 Presence of cardiac pacemaker: Secondary | ICD-10-CM | POA: Diagnosis not present

## 2018-08-27 DIAGNOSIS — E46 Unspecified protein-calorie malnutrition: Secondary | ICD-10-CM | POA: Diagnosis not present

## 2018-08-27 DIAGNOSIS — M810 Age-related osteoporosis without current pathological fracture: Secondary | ICD-10-CM | POA: Diagnosis present

## 2018-08-27 DIAGNOSIS — I5032 Chronic diastolic (congestive) heart failure: Secondary | ICD-10-CM | POA: Diagnosis present

## 2018-08-27 DIAGNOSIS — M19042 Primary osteoarthritis, left hand: Secondary | ICD-10-CM | POA: Diagnosis present

## 2018-08-27 DIAGNOSIS — I5033 Acute on chronic diastolic (congestive) heart failure: Secondary | ICD-10-CM | POA: Diagnosis not present

## 2018-08-27 DIAGNOSIS — I10 Essential (primary) hypertension: Secondary | ICD-10-CM

## 2018-08-27 DIAGNOSIS — S32020D Wedge compression fracture of second lumbar vertebra, subsequent encounter for fracture with routine healing: Secondary | ICD-10-CM | POA: Diagnosis not present

## 2018-08-27 DIAGNOSIS — F419 Anxiety disorder, unspecified: Secondary | ICD-10-CM | POA: Diagnosis not present

## 2018-08-27 DIAGNOSIS — Z888 Allergy status to other drugs, medicaments and biological substances status: Secondary | ICD-10-CM

## 2018-08-27 DIAGNOSIS — M2550 Pain in unspecified joint: Secondary | ICD-10-CM | POA: Diagnosis not present

## 2018-08-27 DIAGNOSIS — Z66 Do not resuscitate: Secondary | ICD-10-CM | POA: Diagnosis present

## 2018-08-27 DIAGNOSIS — R1084 Generalized abdominal pain: Secondary | ICD-10-CM | POA: Diagnosis not present

## 2018-08-27 DIAGNOSIS — Z9841 Cataract extraction status, right eye: Secondary | ICD-10-CM

## 2018-08-27 DIAGNOSIS — M545 Low back pain: Secondary | ICD-10-CM | POA: Diagnosis not present

## 2018-08-27 DIAGNOSIS — E279 Disorder of adrenal gland, unspecified: Secondary | ICD-10-CM | POA: Diagnosis present

## 2018-08-27 DIAGNOSIS — G8929 Other chronic pain: Secondary | ICD-10-CM | POA: Diagnosis not present

## 2018-08-27 DIAGNOSIS — K567 Ileus, unspecified: Secondary | ICD-10-CM | POA: Diagnosis present

## 2018-08-27 DIAGNOSIS — M479 Spondylosis, unspecified: Secondary | ICD-10-CM | POA: Diagnosis present

## 2018-08-27 DIAGNOSIS — Z9842 Cataract extraction status, left eye: Secondary | ICD-10-CM

## 2018-08-27 DIAGNOSIS — I110 Hypertensive heart disease with heart failure: Secondary | ICD-10-CM | POA: Diagnosis present

## 2018-08-27 DIAGNOSIS — E876 Hypokalemia: Secondary | ICD-10-CM | POA: Diagnosis not present

## 2018-08-27 DIAGNOSIS — R41841 Cognitive communication deficit: Secondary | ICD-10-CM | POA: Diagnosis not present

## 2018-08-27 DIAGNOSIS — R52 Pain, unspecified: Secondary | ICD-10-CM | POA: Diagnosis not present

## 2018-08-27 DIAGNOSIS — M19071 Primary osteoarthritis, right ankle and foot: Secondary | ICD-10-CM | POA: Diagnosis present

## 2018-08-27 DIAGNOSIS — Z7901 Long term (current) use of anticoagulants: Secondary | ICD-10-CM

## 2018-08-27 DIAGNOSIS — R609 Edema, unspecified: Secondary | ICD-10-CM | POA: Diagnosis not present

## 2018-08-27 DIAGNOSIS — Z7984 Long term (current) use of oral hypoglycemic drugs: Secondary | ICD-10-CM

## 2018-08-27 DIAGNOSIS — K598 Other specified functional intestinal disorders: Secondary | ICD-10-CM | POA: Diagnosis not present

## 2018-08-27 DIAGNOSIS — Z7401 Bed confinement status: Secondary | ICD-10-CM | POA: Diagnosis not present

## 2018-08-27 DIAGNOSIS — R0602 Shortness of breath: Secondary | ICD-10-CM

## 2018-08-27 DIAGNOSIS — Z87442 Personal history of urinary calculi: Secondary | ICD-10-CM

## 2018-08-27 DIAGNOSIS — Z961 Presence of intraocular lens: Secondary | ICD-10-CM | POA: Diagnosis present

## 2018-08-27 DIAGNOSIS — R1900 Intra-abdominal and pelvic swelling, mass and lump, unspecified site: Secondary | ICD-10-CM | POA: Diagnosis not present

## 2018-08-27 DIAGNOSIS — Z85828 Personal history of other malignant neoplasm of skin: Secondary | ICD-10-CM

## 2018-08-27 DIAGNOSIS — M6281 Muscle weakness (generalized): Secondary | ICD-10-CM | POA: Diagnosis not present

## 2018-08-27 LAB — CBC
HCT: 38.3 % (ref 36.0–46.0)
Hemoglobin: 12.6 g/dL (ref 12.0–15.0)
MCH: 33.1 pg (ref 26.0–34.0)
MCHC: 32.9 g/dL (ref 30.0–36.0)
MCV: 100.5 fL — ABNORMAL HIGH (ref 80.0–100.0)
Platelets: 219 10*3/uL (ref 150–400)
RBC: 3.81 MIL/uL — ABNORMAL LOW (ref 3.87–5.11)
RDW: 19.2 % — ABNORMAL HIGH (ref 11.5–15.5)
WBC: 11.8 10*3/uL — ABNORMAL HIGH (ref 4.0–10.5)
nRBC: 0 % (ref 0.0–0.2)

## 2018-08-27 LAB — COMPREHENSIVE METABOLIC PANEL
ALT: 17 U/L (ref 0–44)
AST: 19 U/L (ref 15–41)
Albumin: 3.6 g/dL (ref 3.5–5.0)
Alkaline Phosphatase: 75 U/L (ref 38–126)
Anion gap: 9 (ref 5–15)
BUN: 13 mg/dL (ref 8–23)
CO2: 26 mmol/L (ref 22–32)
Calcium: 9.4 mg/dL (ref 8.9–10.3)
Chloride: 99 mmol/L (ref 98–111)
Creatinine, Ser: 0.73 mg/dL (ref 0.44–1.00)
GFR calc Af Amer: >60 mL/min (ref >60)
GFR calc non Af Amer: >60 mL/min (ref >60)
Glucose, Bld: 171 mg/dL — ABNORMAL HIGH (ref 70–99)
Potassium: 4.3 mmol/L (ref 3.5–5.1)
Sodium: 134 mmol/L — ABNORMAL LOW (ref 135–145)
Total Bilirubin: 0.8 mg/dL (ref 0.3–1.2)
Total Protein: 6.3 g/dL — ABNORMAL LOW (ref 6.5–8.1)

## 2018-08-27 LAB — BRAIN NATRIURETIC PEPTIDE: B Natriuretic Peptide: 228.8 pg/mL — ABNORMAL HIGH (ref 0.0–100.0)

## 2018-08-27 LAB — PROTIME-INR
INR: 1.4 — ABNORMAL HIGH (ref 0.8–1.2)
Prothrombin Time: 17.2 seconds — ABNORMAL HIGH (ref 11.4–15.2)

## 2018-08-27 LAB — LIPASE, BLOOD: Lipase: 27 U/L (ref 11–51)

## 2018-08-27 MED ORDER — LORAZEPAM 0.5 MG PO TABS: 0.2500 mg | ORAL_TABLET | Freq: Three times a day (TID) | ORAL | Status: DC | PRN

## 2018-08-27 MED ORDER — AZELASTINE HCL 0.1 % NA SOLN
1.0000 | Freq: Every day | NASAL | Status: DC | PRN
  Filled 2018-08-27: qty 30

## 2018-08-27 MED ORDER — POLYETHYLENE GLYCOL 3350 17 G PO PACK: 17.0000 g | PACK | Freq: Every day | ORAL | Status: DC | PRN

## 2018-08-27 MED ORDER — ROSUVASTATIN CALCIUM 5 MG PO TABS
2.5000 mg | ORAL_TABLET | ORAL | Status: DC
  Administered 2018-08-29 – 2018-08-31 (×2): 2.5 mg via ORAL
  Filled 2018-08-27 (×2): qty 1

## 2018-08-27 MED ORDER — MORPHINE SULFATE (PF) 2 MG/ML IV SOLN: 0.5000 mg | INTRAVENOUS | Status: DC | PRN

## 2018-08-27 MED ORDER — ACETAMINOPHEN 650 MG RE SUPP: 650.0000 mg | Freq: Four times a day (QID) | RECTAL | Status: DC | PRN

## 2018-08-27 MED ORDER — FLEET ENEMA 7-19 GM/118ML RE ENEM
1.0000 | ENEMA | Freq: Once | RECTAL | Status: AC
  Administered 2018-08-28: 1 via RECTAL
  Filled 2018-08-27: qty 1

## 2018-08-27 MED ORDER — IOHEXOL 300 MG/ML  SOLN
100.0000 mL | Freq: Once | INTRAMUSCULAR | Status: AC | PRN
  Administered 2018-08-27: 100 mL via INTRAVENOUS

## 2018-08-27 MED ORDER — HYDRALAZINE HCL 20 MG/ML IJ SOLN: 5.0000 mg | INTRAMUSCULAR | Status: DC | PRN

## 2018-08-27 MED ORDER — ALBUTEROL SULFATE (2.5 MG/3ML) 0.083% IN NEBU: 3.0000 mL | INHALATION_SOLUTION | RESPIRATORY_TRACT | Status: DC | PRN

## 2018-08-27 MED ORDER — INSULIN ASPART 100 UNIT/ML ~~LOC~~ SOLN: 0.0000 [IU] | Freq: Every day | SUBCUTANEOUS | Status: DC

## 2018-08-27 MED ORDER — DILTIAZEM HCL ER COATED BEADS 240 MG PO CP24
240.0000 mg | ORAL_CAPSULE | Freq: Every day | ORAL | Status: DC
  Administered 2018-08-28 – 2018-09-02 (×6): 240 mg via ORAL
  Filled 2018-08-27 (×6): qty 1

## 2018-08-27 MED ORDER — SERTRALINE HCL 50 MG PO TABS
50.0000 mg | ORAL_TABLET | Freq: Every day | ORAL | Status: DC
  Administered 2018-08-28 – 2018-09-02 (×6): 50 mg via ORAL
  Filled 2018-08-27 (×6): qty 1

## 2018-08-27 MED ORDER — ACETAMINOPHEN 325 MG PO TABS
650.0000 mg | ORAL_TABLET | Freq: Four times a day (QID) | ORAL | Status: DC | PRN
  Administered 2018-08-28 – 2018-09-01 (×3): 650 mg via ORAL
  Filled 2018-08-27 (×3): qty 2

## 2018-08-27 MED ORDER — AMIODARONE HCL 200 MG PO TABS
200.0000 mg | ORAL_TABLET | Freq: Every day | ORAL | Status: DC
  Administered 2018-08-28 – 2018-09-02 (×6): 200 mg via ORAL
  Filled 2018-08-27 (×6): qty 1

## 2018-08-27 MED ORDER — CARVEDILOL 6.25 MG PO TABS
6.2500 mg | ORAL_TABLET | Freq: Two times a day (BID) | ORAL | Status: DC
  Administered 2018-08-28 – 2018-09-02 (×12): 6.25 mg via ORAL
  Filled 2018-08-27 (×12): qty 1

## 2018-08-27 MED ORDER — ONDANSETRON HCL 4 MG/2ML IJ SOLN: 4.0000 mg | Freq: Three times a day (TID) | INTRAMUSCULAR | Status: DC | PRN

## 2018-08-27 MED ORDER — TRAMADOL HCL 50 MG PO TABS
50.0000 mg | ORAL_TABLET | Freq: Three times a day (TID) | ORAL | Status: DC | PRN
  Administered 2018-09-01: 50 mg via ORAL
  Filled 2018-08-27: qty 1

## 2018-08-27 MED ORDER — INSULIN ASPART 100 UNIT/ML ~~LOC~~ SOLN
0.0000 [IU] | Freq: Three times a day (TID) | SUBCUTANEOUS | Status: DC
  Administered 2018-08-28 (×2): 1 [IU] via SUBCUTANEOUS
  Administered 2018-08-29: 2 [IU] via SUBCUTANEOUS
  Administered 2018-08-29: 1 [IU] via SUBCUTANEOUS
  Administered 2018-08-30 (×2): 3 [IU] via SUBCUTANEOUS
  Administered 2018-08-31: 13:00:00 2 [IU] via SUBCUTANEOUS
  Administered 2018-08-31 – 2018-09-02 (×5): 1 [IU] via SUBCUTANEOUS

## 2018-08-27 MED ORDER — FUROSEMIDE 40 MG PO TABS
40.0000 mg | ORAL_TABLET | Freq: Every day | ORAL | Status: DC
  Administered 2018-08-28 – 2018-08-31 (×4): 40 mg via ORAL
  Filled 2018-08-27 (×5): qty 1

## 2018-08-27 NOTE — H&P (Signed)
History and Physical    Jo [name] MRN:[id number] DOB: [birth year] DOA: 08/27/2018  Referring MD/NP/PA:   PCP: Crist Infante, MD   Patient coming from:  The patient is coming from home.  At baseline, pt is independent for most of ADL.        Chief Complaint: Abdominal distention, shortness of breath  HPI: Jo [name] is a 85 y.o. female with medical history significant of hypertension, hyperlipidemia, diabetes mellitus, SSS, s/p of pacemaker placement, PAF on Eliquis, dCHF, anxiety, HOH, partial small bowel obstruction, who presents with abdominal distention and shortness of breath.  Per pt's daughter (I called her daughter by phone), patient has been having abdominal swelling and abdominal distention for several days.  Patient does not seem to seem to have abdominal pain or active vomiting per her daughter.  Last bowel movement was 2 days ago.  Her daughter states that they treated her with laxatives including MiraLAX and pt had some bowel movement. Pt also has shortness of breath, but no cough, fever, chills, chest pain.  Daughter states that patient has gained approximately 3 pounds last week.  Patient was recently hospitalized from 5/30-6/3 due to CHF exacerbation.  After she went home, she did not need oxygen, but today patient had oxygen desaturation to 88% on room air, and required 2 L nasal cannula oxygen at home per her daughter.  Patient has a generalized weakness, but no unilateral tenderness or numbness.  No facial droop or slurred speech no symptoms of UTI.  Patient seems to have right hip pain per her daughter.  ED Course: pt was found to have BNP 228, lipase 27, LFT normal, creatinine BUN normal, WBC 11.8, temperature normal, blood pressure 171/107, heart rate 70, oxygen saturation 94%.  Chest x-ray showed a bronchitic change with possible left base atelectasis. Pt is admitted to tele bed as inpt.  CT-abdomen/pelvis: 1. Multiple dilated small bowel loops in the abdomen and pelvis.  There is air and stool identified in the colon. The findings Gebel be due to early or partial small bowel obstruction. 2. Further loss of vertebral body height with compression fracture at L2 compared to prior exam of July 13, 2018, superimposed acute fracture is not excluded.  US-abdomen: 1. Technically difficult exam due to bowel gas. 2. Right adrenal mass measuring up to 5.8 cm in size, similar dimensions 2 most recent CT. A previously identified left adrenal mass and enhancing lesion along the falciform ligament are not well visualized. 3. Cholelithiasis without evidence of acute cholecystitis. 4. Lobular appearance of left kidney Anna reflect fetal lobulation versus scarring.   Review of Systems:   General: no fevers, chills, no body weight gain, has poor appetite, has fatigue HEENT: no blurry vision, hearing changes or sore throat Respiratory: has dyspnea, no coughing, wheezing CV: no chest pain, no palpitations GI: no nausea, vomiting, has abdominal distension, no diarrhea, constipation GU: no dysuria, burning on urination, increased urinary frequency, hematuria  Ext: no leg edema Neuro: no unilateral weakness, numbness, or tingling, no vision change or hearing loss Skin: no rash, no skin tear. MSK: No muscle spasm, no deformity, no limitation of range of movement in spin Heme: No easy bruising.  Travel history: No recent long distant travel.  Allergy:  Allergies  Allergen Reactions  . Sulfonamide Derivatives Rash    "it was all over my legs"  . Citalopram Hydrobromide Other (See Comments)    Pt does not remember reaction    Past Medical History:  Diagnosis Date  .  Anxiety   . Arthritis    "hands; between shoulders; back; feet"  . Chronic anticoagulation    -->Eliquis  . Chronic diastolic CHF (congestive heart failure) ([place])    a. 06/2011 Echo: EF 60-65%.  . Depression   . Essential hypertension   . High risk medication use    on Flecainide  . Kidney stone     passed in [place] 2014  . Moderate aortic insufficiency    a. 06/2011 Echo: EF 60-65%, mild LVH, no rwma, mod AI.  Marland Kitchen Osteoporosis   . Osteoporosis   . PAF (paroxysmal atrial fibrillation) (HCC)    a. chronic flecainide->reduced to 25 bid 08/2014.  Marland Kitchen Panic attacks    "since losing husband 1994"  . Skin cancer ?1960's   "between breasts"  . SSS (sick sinus syndrome) (HCC)    a. s/p pacemaker. b. s/p Medtronic gen change 07/2011; c.   . Type II diabetes mellitus ([place])    "controlled w/diet"    Past Surgical History:  Procedure Laterality Date  . APPENDECTOMY  1955  . BREAST CYST EXCISION  ~ 1950   right  . BREAST LUMPECTOMY WITH NEEDLE LOCALIZATION Bilateral 02/03/2014   Procedure: BILATERAL BREAST LUMPECTOMY WITH NEEDLE LOCALIZATION ON LEFT;  Surgeon: Excell Seltzer, MD;  Location: [facility];  Service: General;  Laterality: Bilateral;  . CARDIOVASCULAR STRESS TEST  12/09/2004  . CATARACT EXTRACTION W/ INTRAOCULAR LENS  IMPLANT, BILATERAL  1990's  . [place]  . DILATION AND CURETTAGE OF UTERUS  1953  . INSERT / REPLACE / REMOVE PACEMAKER  03/16/2006  . PERMANENT PACEMAKER GENERATOR CHANGE  08/03/2011   Procedure: PERMANENT PACEMAKER GENERATOR CHANGE;  Surgeon: Thompson Grayer, MD;  Location: [facility] CATH LAB;  Service: Cardiovascular;;  . SKIN CANCER EXCISION  ?1960's   "between my breasts"  . TONSILLECTOMY     "school age"    Social History:  reports that she has never smoked. She has never used smokeless tobacco. She reports that she does not drink alcohol or use drugs.  Family History:  Family History  Problem Relation Age of Onset  . Emphysema Father      Prior to Admission medications   Medication Sig Start Date End Date Taking? Authorizing Provider  acetaminophen (TYLENOL) 500 MG tablet Take 500-1,000 mg by mouth every 6 (six) hours as needed for mild pain, moderate pain, fever or headache.    [provider]  amiodarone (PACERONE) 200 MG tablet TAKE 1 TABLET BY  MOUTH EVERY DAY Patient taking differently: Take 200 mg by mouth daily.  02/21/18   Sherran Needs, NP  apixaban (ELIQUIS) 2.5 MG TABS tablet Take 1 tablet (2.5 mg total) by mouth 2 (two) times daily. 01/28/14   Martinique, Peter M, MD  azelastine (ASTELIN) 0.1 % nasal spray Place 1 spray into both nostrils daily as needed for rhinitis. Use in each nostril as directed    [provider]  carvedilol (COREG) 6.25 MG tablet Take 6.25 mg by mouth 2 (two) times daily with a meal. Hold for SBP<110 OR HR <60    [provider]  CRESTOR 5 MG tablet Take 2.5 mg by mouth every Monday, Wednesday, and Friday.  07/25/14   [provider]  diltiazem (CARDIZEM CD) 240 MG 24 hr capsule Take 240 mg by mouth daily.  09/16/14   [provider]  estradiol (ESTRACE) 0.1 MG/GM vaginal cream Place 1 Applicatorful vaginally every Monday, Wednesday, and Friday.    [provider]  feeding supplement, ENSURE ENLIVE, (ENSURE ENLIVE) LIQD Take 237 mLs by mouth 2 (two) times daily between meals. Patient taking differently: Take 237 mLs by mouth 2 (two) times daily as needed (nutrition).  06/07/17   Ghimire, Henreitta Leber, MD  furosemide (LASIX) 40 MG tablet Take 1 tablet (40 mg total) by mouth daily for 30 days. 06/28/18 07/28/18  Shelly Coss, MD  JANUVIA 100 MG tablet Take 100 mg by mouth daily. 08/12/17   [provider]  LORazepam (ATIVAN) 0.5 MG tablet Take 0.5-1 tablets (0.25-0.5 mg total) by mouth See admin instructions. Take 0.25 mg tablet in the morning and afternoon, then 0.5 mg at bedtime 06/07/17   Ghimire, Henreitta Leber, MD  polyethylene glycol (MIRALAX / Floria Raveling) packet Take 17 g by mouth 2 (two) times daily as needed for mild constipation.     [provider]  potassium chloride SA (K-DUR) 20 MEQ tablet Take 1 tablet (20 mEq total) by mouth daily. 06/27/18   Shelly Coss, MD  sertraline (ZOLOFT) 50 MG tablet Take 50 mg by mouth daily. 07/20/14   [provider]   Vitamin D, Ergocalciferol, (DRISDOL) 50000 UNITS CAPS capsule Take 50,000 Units by mouth every Wednesday.    [provider]    Physical Exam: Vitals:   08/27/18 1730 08/27/18 1745 08/27/18 1800 08/27/18 1930  BP: (!) 148/78 140/71 137/73 (!) 171/107  Pulse:    70  Resp: (!) 24 16 19 18   Temp:      TempSrc:      SpO2:    94%   General: Not in acute distress HEENT:       Eyes: PERRL, EOMI, no scleral icterus.       ENT: No discharge from the ears and nose, no pharynx injection, no tonsillar enlargement.        Neck: No JVD, no bruit, no mass felt. Heme: No neck lymph node enlargement. Cardiac: S1/S2, RRR, No murmurs, No gallops or rubs. Respiratory: No rales, wheezing, rhonchi or rubs. GI: Soft, distended, nontender, no rebound pain, no organomegaly, BS present. GU: No hematuria Ext: No pitting leg edema bilaterally. 2+DP/PT pulse bilaterally. Musculoskeletal: No joint deformities, No joint redness or warmth, no limitation of ROM in spin. Skin: No rashes.  Neuro: Alert, oriented X3, cranial nerves II-XII grossly intact, moves all extremities. Psych: Patient is not psychotic, no suicidal or hemocidal ideation.  Labs on Admission: I have personally reviewed following labs and imaging studies  CBC: Recent Labs  Lab 08/27/18 1807  WBC 11.8*  HGB 12.6  HCT 38.3  MCV 100.5*  PLT 366   Basic Metabolic Panel: Recent Labs  Lab 08/27/18 1807  NA 134*  K 4.3  CL 99  CO2 26  GLUCOSE 171*  BUN 13  CREATININE 0.73  CALCIUM 9.4   GFR: CrCl cannot be calculated (Unknown ideal weight.). Liver Function Tests: Recent Labs  Lab 08/27/18 1807  AST 19  ALT 17  ALKPHOS 75  BILITOT 0.8  PROT 6.3*  ALBUMIN 3.6   Recent Labs  Lab 08/27/18 1807  LIPASE 27   No results for input(s): AMMONIA in the last 168 hours. Coagulation Profile: Recent Labs  Lab 08/27/18 1807  INR 1.4*   Cardiac Enzymes: No results for input(s): CKTOTAL, CKMB, CKMBINDEX, TROPONINI in  the last 168 hours. BNP (last 3 results) No results for input(s): PROBNP in the last 8760 hours. HbA1C: No results for input(s): HGBA1C in the last 72 hours. CBG: No results for input(s): GLUCAP in  the last 168 hours. Lipid Profile: No results for input(s): CHOL, HDL, LDLCALC, TRIG, CHOLHDL, LDLDIRECT in the last 72 hours. Thyroid Function Tests: No results for input(s): TSH, T4TOTAL, FREET4, T3FREE, THYROIDAB in the last 72 hours. Anemia Panel: No results for input(s): VITAMINB12, FOLATE, FERRITIN, TIBC, IRON, RETICCTPCT in the last 72 hours. Urine analysis:    Component Value Date/Time   COLORURINE COLORLESS (A) 06/23/2018 1244   APPEARANCEUR CLEAR 06/23/2018 1244   LABSPEC 1.003 (L) 06/23/2018 1244   PHURINE 7.0 06/23/2018 [place] 06/23/2018 [place] 06/23/2018 [place] 06/23/2018 [place] 06/23/2018 1244   PROTEINUR NEGATIVE 06/23/2018 1244   UROBILINOGEN 1.0 07/06/2012 1130   NITRITE NEGATIVE 06/23/2018 1244   LEUKOCYTESUR NEGATIVE 06/23/2018 1244   Sepsis Labs: @LABRCNTIP (procalcitonin:4,lacticidven:4) )No results found for this or any previous visit (from the past 240 hour(s)).   Radiological Exams on Admission: US Abdomen Complete  Result Date: 08/27/2018 CLINICAL DATA:  Abdominal swelling EXAM: ABDOMEN ULTRASOUND COMPLETE COMPARISON:  CT abdomen pelvis July 13, 2018 FINDINGS: Technically difficult exam due to extensive bowel gas which obscures portions of the abdomen is noted low. Gallbladder: Multiple echogenic, post of the shadowing gallstones are present. Largest measures up to 1.9 cm in size. No wall thickening visualized. No sonographic Murphy sign noted by sonographer. Common bile duct: Diameter: 5.5 mm Liver: Portions of the right lobe liver obscured by bowel gas. An enhancing lesion along the false form ligament is not well visualized on this study. Within normal limits in parenchymal echogenicity.  Portal vein is patent on color Doppler imaging with normal direction of blood flow towards the liver. IVC: Not well visualized due to bowel gas. Pancreas: Not visualized due to bowel gas. Spleen: Size (4.4 cm) and appearance within normal limits. Right Kidney: Length: 9.7. Echogenicity within normal limits. No mass or hydronephrosis visualized. Left Kidney: Length: 8.3 cm. Echogenicity within normal limits. No mass or hydronephrosis visualized. Lobular contours Steury reflect fetal lobulation versus scarring. Abdominal aorta: No aneurysm visualized. Other findings: Trace right pleural effusion. Right adrenal mass measuring approximately 5.8 x 3.6 x 4.9 cm. IMPRESSION: Technically difficult exam due to bowel gas. Right adrenal mass measuring up to 5.8 cm in size, similar dimensions 2 most recent CT. A previously identified left adrenal mass and enhancing lesion along the falciform ligament are not well visualized. Cholelithiasis without evidence of acute cholecystitis. Lobular appearance of left kidney Feldmeier reflect fetal lobulation versus scarring. Electronically Signed   By: Lovena Le M.D.   On: 08/27/2018 18:45   Ct Abdomen Pelvis W Contrast  Result Date: 08/27/2018 CLINICAL DATA:  Abdominal distension EXAM: CT ABDOMEN AND PELVIS WITH CONTRAST TECHNIQUE: Multidetector CT imaging of the abdomen and pelvis was performed using the standard protocol following bolus administration of intravenous contrast. CONTRAST:  129mL OMNIPAQUE IOHEXOL 300 MG/ML  SOLN COMPARISON:  July 13, 2018 FINDINGS: Lower chest: Small left pleural effusion is identified. Mild atelectasis of posterior left lung base is noted. The heart size is enlarged. Hepatobiliary: There is stable intra and extrahepatic biliary ductal dilatation. The gallbladder is stable. There is a 2.4 cm enhancing lesion near the falciform ligament unchanged compared prior exam. No other focal liver lesion is identified. Pancreas: Unremarkable. No pancreatic ductal  dilatation or surrounding inflammatory changes. Spleen: Normal in size without focal abnormality. Adrenals/Urinary Tract: Stable 5.5 cm fatty right adrenal lesion is identified consistent with myelolipoma. This is unchanged. 3.1 cm left adrenal mass is  unchanged compared prior exam. There is atrophy of the left kidney. There is no hydronephrosis bilaterally. The bladder is partially decompressed without gross abnormality. Stomach/Bowel: There are multiple dilated small bowel loops throughout the abdomen and pelvis without clear transition. The left colon and sigmoid colon are decompressed. There is stool and air identified in the right colon and rectal sigmoid colon. The appendix not definitely seen but no inflammation is noted around the cecum. There is a small hiatal hernia. Vascular/Lymphatic: Aortic atherosclerosis. No enlarged abdominal or pelvic lymph nodes. Reproductive: Uterus and bilateral adnexa are unremarkable. Other: None. Musculoskeletal: There are degenerative joint changes the spine. There is further loss of height of compression fracture at L2 compared to prior exam of July 13, 2018, superimposed acute fracture is not excluded. IMPRESSION: Multiple dilated small bowel loops in the abdomen and pelvis. There is air and stool identified in the colon. The findings Theilen be due to early or partial small bowel obstruction. Follow-up is recommended. Further loss of vertebral body height with compression fracture at L2 compared to prior exam of July 13, 2018, superimposed acute fracture is not excluded. Electronically Signed   By: Abelardo Diesel M.D.   On: 08/27/2018 20:14   Dg Chest Portable 1 View  Result Date: 08/27/2018 CLINICAL DATA:  Shortness of breath, abdominal swelling EXAM: PORTABLE CHEST 1 VIEW COMPARISON:  Portable exam 1733 hours compared to 07/13/2018 FINDINGS: Left subclavian sequential transvenous pacemaker leads project at right atrium and right ventricle. Minimal enlargement of cardiac  silhouette. Mediastinal contours and pulmonary vascularity normal. Atherosclerotic calcification aorta. Bronchitic changes with subsegmental atelectasis LEFT base. Lungs otherwise clear. No pleural effusion or pneumothorax. Bones demineralized. IMPRESSION: Bronchitic changes with subsegmental atelectasis at LEFT base. Electronically Signed   By: Lavonia Dana M.D.   On: 08/27/2018 17:56     EKG: Independently reviewed.  Sinus rhythm, QTC 432, LAD, LAD, poor R wave progression.   Assessment/Plan Principal Problem:   Partial small bowel obstruction (HCC) Active Problems:   PAF (paroxysmal atrial fibrillation) (HCC)   Hypertension   Diabetes mellitus without complication (HCC)   Anxiety   Dyslipidemia   Chronic diastolic CHF (congestive heart failure) (HCC)   Possible partial small bowel obstruction ([place]): Patient's abdominal distention is likely due to partial small bowel obstruction as evidenced by CT scan.  Another differential diagnosis is constipation.  General surgeon, Dr. Dema Severin is consulted.  -will admit tele bed as inpt -f/u general surgeon's recommendation -N.p.o. now -miralax prn -prn morhphine for pain and zofran for nausea -Fleet enama x 1   PAF (paroxysmal atrial fibrillation) ([place]): CHA2DS2-VASc Score is 6, needs oral anticoagulation. Patient is on Eliquis at home. Heart rate is well controlled. -will hold Eliquis in case pt needs surgery if deteriorates -continue amiodarone, Coreg, Cardizem  Hypertension: -IV hydralazine as needed -Continue Coreg and Cardizem  Diabetes mellitus without complication ([place]): Last A1c 7.6, not well controled. Patient is taking Januvia at home -SSI  Anxiety: -Continue home ativan prn  Dyslipidemia: -Crestor  Chronic diastolic CHF (congestive heart failure) ([place]): 2D echo on 06/05/2017 showed EF 65-70%.  BNP slightly elevated 228.  Patient has worsening shortness of breath.  She Bickle have a mild CHF exacerbation. -will continue oral  lasix 40 mg daily -will not give IV Lasix since patient is on p.o.  Adrenal mass: US showed a right adrenal mass measuring up to 5.8 cm in size, similar dimensions 2 most recent CT. A previously identified left adrenal mass and enhancing lesion along the  falciform ligament are not well visualized. -f/u with PCP   Inpatient status:  # Patient requires inpatient status due to high intensity of service, high risk for further deterioration and high frequency of surveillance required.  I certify that at the point of admission it is my clinical judgment that the patient will require inpatient hospital care spanning beyond 2 midnights from the point of admission.  . This patient has multiple chronic comorbidities including hypertension, hyperlipidemia, diabetes mellitus, SSS, s/p of pacemaker placement, PAF on Eliquis, dCHF, anxiety, HOH, partial small bowel obstruction . Now patient has presenting with abdominal distention, possibly due to partial small bowel obstruction . The worrisome physical exam findings include abdominal distention . The initial radiographic and laboratory data are worrisome because of possible small bowel partial obstruction by CT scan.  Patient also has elevated BNP . Current medical needs: please see my assessment and plan . Predictability of an adverse outcome (risk): Patient is multiple complaints, now presents with abdominal distention, possibly due to partial small bowel obstruction as evidenced by CT scan.  Given her old age, and multiple comorbidities, patient is at high risk for deteriorating.  Will need to be treated in hospital for at least 2 days.     DVT ppx: SCD Code Status: DNR per her daughter. Family Communication: yes, her daughter on by phone. Disposition Plan:  Anticipate discharge back to previous home environment Consults called: General surgeon Admission status:   Inpatient/tele        Date of Service 08/27/2018    [place] [facility]    If 7PM-7AM, please contact night-coverage [email or web address] Password [facility] 08/27/2018, 9:16 PM

## 2018-08-27 NOTE — ED Notes (Signed)
ED TO INPATIENT HANDOFF REPORT  ED Nurse Name and Phone #:  [phone]  S Name/Age/Gender Jo [name] 85 y.o. female Room/Bed: 038C/038C  Code Status   Code Status: Prior  Home/SNF/Other Home Patient oriented to: self, place, time and situation Is this baseline? Yes   Triage Complete: Triage complete  Chief Complaint CHF; Swelling  Triage Note Pt presents to the ED with complaints of abdominal swelling and SOB X3 days. Pt denies pain in triage. Pt very hard of hearing. EMS reports family giving lasix to pt with no relief.    Allergies Allergies  Allergen Reactions  . Sulfonamide Derivatives Rash    "it was all over my legs"  . Citalopram Hydrobromide Other (See Comments)    Pt does not remember reaction    Level of Care/Admitting Diagnosis ED Disposition    ED Disposition Condition [facility] [place] [facility]: [place] [[id number]]  Level of Care: Telemetry Medical [104]  Covid Evaluation: Asymptomatic Screening Protocol (No Symptoms)  Diagnosis: Partial small bowel obstruction [facility]) [[id number]]  Admitting Physician: Ivor Costa [[id number]]  Attending Physician: Ivor Costa [id number]  Estimated length of stay: past midnight tomorrow  Certification:: I certify this patient will need inpatient services for at least 2 midnights  PT Class (Do Not Modify): Inpatient [101]  PT Acc Code (Do Not Modify): Private [1]       B Medical/Surgery History Past Medical History:  Diagnosis Date  . Anxiety   . Arthritis    "hands; between shoulders; back; feet"  . Chronic anticoagulation    -->Eliquis  . Chronic diastolic CHF (congestive heart failure) ([place])    a. 06/2011 Echo: EF 60-65%.  . Depression   . Essential hypertension   . High risk medication use    on Flecainide  . Kidney stone    passed in [place] 2014  . Moderate aortic insufficiency    a. 06/2011 Echo: EF 60-65%, mild LVH, no rwma, mod AI.  Marland Kitchen Osteoporosis   . Osteoporosis   . PAF (paroxysmal  atrial fibrillation) (HCC)    a. chronic flecainide->reduced to 25 bid 08/2014.  Marland Kitchen Panic attacks    "since losing husband 1994"  . Skin cancer ?1960's   "between breasts"  . SSS (sick sinus syndrome) (HCC)    a. s/p pacemaker. b. s/p Medtronic gen change 07/2011; c.   . Type II diabetes mellitus ([place])    "controlled w/diet"   Past Surgical History:  Procedure Laterality Date  . APPENDECTOMY  1955  . BREAST CYST EXCISION  ~ 1950   right  . BREAST LUMPECTOMY WITH NEEDLE LOCALIZATION Bilateral 02/03/2014   Procedure: BILATERAL BREAST LUMPECTOMY WITH NEEDLE LOCALIZATION ON LEFT;  Surgeon: Excell Seltzer, MD;  Location: [facility];  Service: General;  Laterality: Bilateral;  . CARDIOVASCULAR STRESS TEST  12/09/2004  . CATARACT EXTRACTION W/ INTRAOCULAR LENS  IMPLANT, BILATERAL  1990's  . [place]  . DILATION AND CURETTAGE OF UTERUS  1953  . INSERT / REPLACE / REMOVE PACEMAKER  03/16/2006  . PERMANENT PACEMAKER GENERATOR CHANGE  08/03/2011   Procedure: PERMANENT PACEMAKER GENERATOR CHANGE;  Surgeon: Thompson Grayer, MD;  Location: [facility] CATH LAB;  Service: Cardiovascular;;  . SKIN CANCER EXCISION  ?1960's   "between my breasts"  . TONSILLECTOMY     "school age"     A IV Location/Drains/Wounds Patient Lines/Drains/Airways Status   Active Line/Drains/Airways    Name:   Placement date:   Placement time:  Site:   Days:   Peripheral IV 08/27/18 Right Wrist   08/27/18    1917    Wrist   less than 1   Peripheral IV 08/27/18 Left Antecubital   08/27/18    1935    Antecubital   less than 1   External Urinary Catheter   06/24/18    0742    -   64          Intake/Output Last 24 hours No intake or output data in the 24 hours ending 08/27/18 2140  Labs/Imaging Results for orders placed or performed during the hospital encounter of 08/27/18 (from the past 48 hour(s))  Brain natriuretic peptide     Status: Abnormal   Collection Time: 08/27/18  5:38 PM  Result Value Ref Range   B  Natriuretic Peptide 228.8 (H) 0.0 - 100.0 pg/mL    Comment: Performed at [facility], [address], [place], [place] [address]  CBC     Status: Abnormal   Collection Time: 08/27/18  6:07 PM  Result Value Ref Range   WBC 11.8 (H) 4.0 - 10.5 K/uL   RBC 3.81 (L) 3.87 - 5.11 MIL/uL   Hemoglobin 12.6 12.0 - 15.0 g/dL   HCT 38.3 36.0 - 46.0 %   MCV 100.5 (H) 80.0 - 100.0 fL   MCH 33.1 26.0 - 34.0 pg   MCHC 32.9 30.0 - 36.0 g/dL   RDW 19.2 (H) 11.5 - 15.5 %   Platelets 219 150 - 400 K/uL   nRBC 0.0 0.0 - 0.2 %    Comment: Performed at [facility] [address] [place], [place] [address]  Comprehensive metabolic panel     Status: Abnormal   Collection Time: 08/27/18  6:07 PM  Result Value Ref Range   Sodium 134 (L) 135 - 145 mmol/L   Potassium 4.3 3.5 - 5.1 mmol/L   Chloride 99 98 - 111 mmol/L   CO2 26 22 - 32 mmol/L   Glucose, Bld 171 (H) 70 - 99 mg/dL   BUN 13 8 - 23 mg/dL   Creatinine, Ser 0.73 0.44 - 1.00 mg/dL   Calcium 9.4 8.9 - 10.3 mg/dL   Total Protein 6.3 (L) 6.5 - 8.1 g/dL   Albumin 3.6 3.5 - 5.0 g/dL   AST 19 15 - 41 U/L   ALT 17 0 - 44 U/L   Alkaline Phosphatase 75 38 - 126 U/L   Total Bilirubin 0.8 0.3 - 1.2 mg/dL   GFR calc non Af Amer >60 >60 mL/min   GFR calc Af Amer >60 >60 mL/min   Anion gap 9 5 - 15    Comment: Performed at [facility] [address], [place], [place] [address]  Lipase, blood     Status: None   Collection Time: 08/27/18  6:07 PM  Result Value Ref Range   Lipase 27 11 - 51 U/L    Comment: Performed at [facility] [address], [place], [place] [address]  Protime-INR     Status: Abnormal   Collection Time: 08/27/18  6:07 PM  Result Value Ref Range   Prothrombin Time 17.2 (H) 11.4 - 15.2 seconds   INR 1.4 (H) 0.8 - 1.2    Comment: (NOTE) INR goal varies based on device and disease states. Performed at [facility], [place] [address], [place], [name] [address]    US Abdomen Complete  Result Date:  08/27/2018 CLINICAL DATA:  Abdominal swelling EXAM: ABDOMEN ULTRASOUND COMPLETE COMPARISON:  CT abdomen pelvis July 13, 2018 FINDINGS: Technically difficult exam due to extensive bowel gas which obscures portions of the abdomen is noted low. Gallbladder: Multiple echogenic, post of the shadowing gallstones are present. Largest measures up to 1.9 cm in size. No wall thickening visualized. No sonographic Murphy sign noted by sonographer. Common bile duct: Diameter: 5.5 mm Liver: Portions of the right lobe liver obscured by bowel gas. An enhancing lesion along the false form ligament is not well visualized on this study. Within normal limits in parenchymal echogenicity. Portal vein is patent on color Doppler imaging with normal direction of blood flow towards the liver. IVC: Not well visualized due to bowel gas. Pancreas: Not visualized due to bowel gas. Spleen: Size (4.4 cm) and appearance within normal limits. Right Kidney: Length: 9.7. Echogenicity within normal limits. No mass or hydronephrosis visualized. Left Kidney: Length: 8.3 cm. Echogenicity within normal limits. No mass or hydronephrosis visualized. Lobular contours Meister reflect fetal lobulation versus scarring. Abdominal aorta: No aneurysm visualized. Other findings: Trace right pleural effusion. Right adrenal mass measuring approximately 5.8 x 3.6 x 4.9 cm. IMPRESSION: Technically difficult exam due to bowel gas. Right adrenal mass measuring up to 5.8 cm in size, similar dimensions 2 most recent CT. A previously identified left adrenal mass and enhancing lesion along the falciform ligament are not well visualized. Cholelithiasis without evidence of acute cholecystitis. Lobular appearance of left kidney [place] reflect fetal lobulation versus scarring. Electronically Signed   By: Lovena Le M.D.   On: 08/27/2018 18:45   Ct Abdomen Pelvis W Contrast  Result Date: 08/27/2018 CLINICAL DATA:  Abdominal distension EXAM: CT ABDOMEN AND PELVIS WITH CONTRAST  TECHNIQUE: Multidetector CT imaging of the abdomen and pelvis was performed using the standard protocol following bolus administration of intravenous contrast. CONTRAST:  134mL OMNIPAQUE IOHEXOL 300 MG/ML  SOLN COMPARISON:  July 13, 2018 FINDINGS: Lower chest: Small left pleural effusion is identified. Mild atelectasis of posterior left lung base is noted. The heart size is enlarged. Hepatobiliary: There is stable intra and extrahepatic biliary ductal dilatation. The gallbladder is stable. There is a 2.4 cm enhancing lesion near the falciform ligament unchanged compared prior exam. No other focal liver lesion is identified. Pancreas: Unremarkable. No pancreatic ductal dilatation or surrounding inflammatory changes. Spleen: Normal in size without focal abnormality. Adrenals/Urinary Tract: Stable 5.5 cm fatty right adrenal lesion is identified consistent with myelolipoma. This is unchanged. 3.1 cm left adrenal mass is unchanged compared prior exam. There is atrophy of the left kidney. There is no hydronephrosis bilaterally. The bladder is partially decompressed without gross abnormality. Stomach/Bowel: There are multiple dilated small bowel loops throughout the abdomen and pelvis without clear transition. The left colon and sigmoid colon are decompressed. There is stool and air identified in the right colon and rectal sigmoid colon. The appendix not definitely seen but no inflammation is noted around the cecum. There is a small hiatal hernia. Vascular/Lymphatic: Aortic atherosclerosis. No enlarged abdominal or pelvic lymph nodes. Reproductive: Uterus and bilateral adnexa are unremarkable. Other: None. Musculoskeletal: There are degenerative joint changes the spine. There is further loss of height of compression fracture at L2 compared to prior exam of July 13, 2018, superimposed acute fracture is not excluded. IMPRESSION: Multiple dilated small bowel loops in the abdomen and pelvis. There is air and stool identified  in the colon. The findings [place] be due to early or partial small bowel obstruction. Follow-up is recommended. Further loss of vertebral  body height with compression fracture at L2 compared to prior exam of July 13, 2018, superimposed acute fracture is not excluded. Electronically Signed   By: Abelardo Diesel M.D.   On: 08/27/2018 20:14   Dg Chest Portable 1 View  Result Date: 08/27/2018 CLINICAL DATA:  Shortness of breath, abdominal swelling EXAM: PORTABLE CHEST 1 VIEW COMPARISON:  Portable exam 1733 hours compared to 07/13/2018 FINDINGS: Left subclavian sequential transvenous pacemaker leads project at right atrium and right ventricle. Minimal enlargement of cardiac silhouette. Mediastinal contours and pulmonary vascularity normal. Atherosclerotic calcification aorta. Bronchitic changes with subsegmental atelectasis LEFT base. Lungs otherwise clear. No pleural effusion or pneumothorax. Bones demineralized. IMPRESSION: Bronchitic changes with subsegmental atelectasis at LEFT base. Electronically Signed   By: Lavonia Dana M.D.   On: 08/27/2018 17:56    Pending Labs Unresulted Labs (From admission, onward)    Start     Ordered   08/27/18 2024  SARS CORONAVIRUS 2 Nasal Swab Aptima Multi Swab  (Asymptomatic Patients Labs)  Once,   STAT    Question Answer Comment  Is this test for diagnosis or screening Screening   Symptomatic for COVID-19 as defined by CDC No   Hospitalized for COVID-19 No   Admitted to ICU for COVID-19 No   Previously tested for COVID-19 Yes   Resident in a congregate (group) care setting No   Employed in healthcare setting No   Pregnant No      08/27/18 2023   Signed and Held  APTT  Once,   R     Signed and Held   Signed and Held  Type and screen [place]  Once,   R    Comments: [place]    Signed and Held   Signed and Held  Basic metabolic panel  Tomorrow morning,   R     Signed and Held   Signed and Held  CBC  Tomorrow morning,   R      Signed and Held          Vitals/Pain Today's Vitals   08/27/18 1800 08/27/18 1930 08/27/18 1936 08/27/18 2050  BP: 137/73 (!) 171/107    Pulse:  70    Resp: 19 18    Temp:      TempSrc:      SpO2:  94%    PainSc:   0-No pain 0-No pain    Isolation Precautions No active isolations  Medications Medications  iohexol (OMNIPAQUE) 300 MG/ML solution 100 mL (100 mLs Intravenous Contrast Given 08/27/18 1955)    Mobility walks with device Moderate fall risk   Focused Assessments    R Recommendations: See Admitting Provider Note  Report given to:   Additional Notes:

## 2018-08-27 NOTE — Consult Note (Signed)
CC/Reason for consult: possible early sbo vs constipation  HPI: Jo [name] is an 85 y.o. female with hx of HTN, DM, afib, aortic insuff, diastolic CHF, sick sinus syndrome, hearing loss presented to ED for evaluation of subjective sob that she was attributing to increased abdominal distention. Unknown last BM or gas per patient. Denies any nausea or emesis. She denies any abdominal pain but feels bloated. She has tried to take lasix thinking this was related to her heart but it didn't seem to help. Denies aggrav/allev factors. Denies ever having had this before. History somewhat limited by her hearing difficulities - I have tried to reach her daughter but was unable by phone this evening. Our hospitalist was able to reach her and obtain additional history.  Last BM 2d ago Possible hx of prior pSBO in past  PSH: Appendectomy   Past Medical History:  Diagnosis Date   Anxiety    Arthritis    "hands; between shoulders; back; feet"   Chronic anticoagulation    -->Eliquis   Chronic diastolic CHF (congestive heart failure) ([facility])    a. 06/2011 Echo: EF 60-65%.   Depression    Essential hypertension    High risk medication use    on Flecainide   Kidney stone    passed in [place] 2014   Moderate aortic insufficiency    a. 06/2011 Echo: EF 60-65%, mild LVH, no rwma, mod AI.   Osteoporosis    Osteoporosis    PAF (paroxysmal atrial fibrillation) (H[facility])    a. chronic flecainide->reduced to 25 bid 08/2014.   Panic attacks    "since losing husband 1994"   Skin cancer ?1960's   "between breasts"   SSS (sick sinus syndrome) ([facility])    a. s/p pacemaker. b. s/p Medtronic gen change 07/2011; c.    Type II diabetes mellitus ([place])    "controlled w/diet"    Past Surgical History:  Procedure Laterality Date   APPENDECTOMY  1955   BREAST CYST EXCISION  ~ 1950   right   BREAST LUMPECTOMY WITH NEEDLE LOCALIZATION Bilateral 02/03/2014   Procedure: BILATERAL BREAST LUMPECTOMY WITH NEEDLE  LOCALIZATION ON LEFT;  Surgeon: Excell Seltzer, MD;  Location: [facility];  Service: General;  Laterality: Bilateral;   CARDIOVASCULAR STRESS TEST  12/09/2004   CATARACT EXTRACTION W/ INTRAOCULAR LENS  IMPLANT, BILATERAL  1990's   CESAREAN SECTION  1955   DILATION AND CURETTAGE OF UTERUS  1953   INSERT / REPLACE / REMOVE PACEMAKER  03/16/2006   PERMANENT PACEMAKER GENERATOR CHANGE  08/03/2011   Procedure: PERMANENT PACEMAKER GENERATOR CHANGE;  Surgeon: Thompson Grayer, MD;  Location: [facility] CATH LAB;  Service: Cardiovascular;;   SKIN CANCER EXCISION  ?1960's   "between my breasts"   TONSILLECTOMY     "school age"    Family History  Problem Relation Age of Onset   Emphysema Father     Social:  reports that she has never smoked. She has never used smokeless tobacco. She reports that she does not drink alcohol or use drugs.  Allergies:  Allergies  Allergen Reactions   Sulfonamide Derivatives Rash    "it was all over my legs"   Citalopram Hydrobromide Other (See Comments)    Pt does not remember reaction    Medications: I have reviewed the patient's current medications.  Results for orders placed or performed during the hospital encounter of 08/27/18 (from the past 48 hour(s))  Brain natriuretic peptide     Status: Abnormal   Collection Time: 08/27/18  5:38 PM  Result Value Ref Range   B Natriuretic Peptide 228.8 (H) 0.0 - 100.0 pg/mL    Comment: Performed at [facility] [address] [place], [place] [address]  CBC     Status: Abnormal   Collection Time: 08/27/18  6:07 PM  Result Value Ref Range   WBC 11.8 (H) 4.0 - 10.5 K/uL   RBC 3.81 (L) 3.87 - 5.11 MIL/uL   Hemoglobin 12.6 12.0 - 15.0 g/dL   HCT 38.3 36.0 - 46.0 %   MCV 100.5 (H) 80.0 - 100.0 fL   MCH 33.1 26.0 - 34.0 pg   MCHC 32.9 30.0 - 36.0 g/dL   RDW 19.2 (H) 11.5 - 15.5 %   Platelets 219 150 - 400 K/uL   nRBC 0.0 0.0 - 0.2 %    Comment: Performed at [facility] [address],  [place], [place] [address]  Comprehensive metabolic panel     Status: Abnormal   Collection Time: 08/27/18  6:07 PM  Result Value Ref Range   Sodium 134 (L) 135 - 145 mmol/L   Potassium 4.3 3.5 - 5.1 mmol/L   Chloride 99 98 - 111 mmol/L   CO2 26 22 - 32 mmol/L   Glucose, Bld 171 (H) 70 - 99 mg/dL   BUN 13 8 - 23 mg/dL   Creatinine, Ser 0.73 0.44 - 1.00 mg/dL   Calcium 9.4 8.9 - 10.3 mg/dL   Total Protein 6.3 (L) 6.5 - 8.1 g/dL   Albumin 3.6 3.5 - 5.0 g/dL   AST 19 15 - 41 U/L   ALT 17 0 - 44 U/L   Alkaline Phosphatase 75 38 - 126 U/L   Total Bilirubin 0.8 0.3 - 1.2 mg/dL   GFR calc non Af Amer >60 >60 mL/min   GFR calc Af Amer >60 >60 mL/min   Anion gap 9 5 - 15    Comment: Performed at [facility] [address] [place], [place] [address]  Lipase, blood     Status: None   Collection Time: 08/27/18  6:07 PM  Result Value Ref Range   Lipase 27 11 - 51 U/L    Comment: Performed at [facility] [address], [place], [place] [address]  Protime-INR     Status: Abnormal   Collection Time: 08/27/18  6:07 PM  Result Value Ref Range   Prothrombin Time 17.2 (H) 11.4 - 15.2 seconds   INR 1.4 (H) 0.8 - 1.2    Comment: (NOTE) INR goal varies based on device and disease states. Performed at [facility], [place] [address] [place], [place] [address]     US Abdomen Complete  Result Date: 08/27/2018 CLINICAL DATA:  Abdominal swelling EXAM: ABDOMEN ULTRASOUND COMPLETE COMPARISON:  CT abdomen pelvis July 13, 2018 FINDINGS: Technically difficult exam due to extensive bowel gas which obscures portions of the abdomen is noted low. Gallbladder: Multiple echogenic, post of the shadowing gallstones are present. Largest measures up to 1.9 cm in size. No wall thickening visualized. No sonographic Murphy sign noted by sonographer. Common bile duct: Diameter: 5.5 mm Liver: Portions of the right lobe liver obscured by bowel gas. An enhancing lesion along the false form ligament is not well  visualized on this study. Within normal limits in parenchymal echogenicity. Portal vein is patent on color Doppler imaging with normal direction of blood flow towards the liver. IVC: Not well visualized due to bowel gas. Pancreas: Not visualized  due to bowel gas. Spleen: Size (4.4 cm) and appearance within normal limits. Right Kidney: Length: 9.7. Echogenicity within normal limits. No mass or hydronephrosis visualized. Left Kidney: Length: 8.3 cm. Echogenicity within normal limits. No mass or hydronephrosis visualized. Lobular contours Mandala reflect fetal lobulation versus scarring. Abdominal aorta: No aneurysm visualized. Other findings: Trace right pleural effusion. Right adrenal mass measuring approximately 5.8 x 3.6 x 4.9 cm. IMPRESSION: Technically difficult exam due to bowel gas. Right adrenal mass measuring up to 5.8 cm in size, similar dimensions 2 most recent CT. A previously identified left adrenal mass and enhancing lesion along the falciform ligament are not well visualized. Cholelithiasis without evidence of acute cholecystitis. Lobular appearance of left kidney Westerlund reflect fetal lobulation versus scarring. Electronically Signed   By: Lovena Le M.D.   On: 08/27/2018 18:45   Ct Abdomen Pelvis W Contrast  Result Date: 08/27/2018 CLINICAL DATA:  Abdominal distension EXAM: CT ABDOMEN AND PELVIS WITH CONTRAST TECHNIQUE: Multidetector CT imaging of the abdomen and pelvis was performed using the standard protocol following bolus administration of intravenous contrast. CONTRAST:  166mL OMNIPAQUE IOHEXOL 300 MG/ML  SOLN COMPARISON:  July 13, 2018 FINDINGS: Lower chest: Small left pleural effusion is identified. Mild atelectasis of posterior left lung base is noted. The heart size is enlarged. Hepatobiliary: There is stable intra and extrahepatic biliary ductal dilatation. The gallbladder is stable. There is a 2.4 cm enhancing lesion near the falciform ligament unchanged compared prior exam. No other focal  liver lesion is identified. Pancreas: Unremarkable. No pancreatic ductal dilatation or surrounding inflammatory changes. Spleen: Normal in size without focal abnormality. Adrenals/Urinary Tract: Stable 5.5 cm fatty right adrenal lesion is identified consistent with myelolipoma. This is unchanged. 3.1 cm left adrenal mass is unchanged compared prior exam. There is atrophy of the left kidney. There is no hydronephrosis bilaterally. The bladder is partially decompressed without gross abnormality. Stomach/Bowel: There are multiple dilated small bowel loops throughout the abdomen and pelvis without clear transition. The left colon and sigmoid colon are decompressed. There is stool and air identified in the right colon and rectal sigmoid colon. The appendix not definitely seen but no inflammation is noted around the cecum. There is a small hiatal hernia. Vascular/Lymphatic: Aortic atherosclerosis. No enlarged abdominal or pelvic lymph nodes. Reproductive: Uterus and bilateral adnexa are unremarkable. Other: None. Musculoskeletal: There are degenerative joint changes the spine. There is further loss of height of compression fracture at L2 compared to prior exam of July 13, 2018, superimposed acute fracture is not excluded. IMPRESSION: Multiple dilated small bowel loops in the abdomen and pelvis. There is air and stool identified in the colon. The findings Sacra be due to early or partial small bowel obstruction. Follow-up is recommended. Further loss of vertebral body height with compression fracture at L2 compared to prior exam of July 13, 2018, superimposed acute fracture is not excluded. Electronically Signed   By: Abelardo Diesel M.D.   On: 08/27/2018 20:14   Dg Chest Portable 1 View  Result Date: 08/27/2018 CLINICAL DATA:  Shortness of breath, abdominal swelling EXAM: PORTABLE CHEST 1 VIEW COMPARISON:  Portable exam 1733 hours compared to 07/13/2018 FINDINGS: Left subclavian sequential transvenous pacemaker leads  project at right atrium and right ventricle. Minimal enlargement of cardiac silhouette. Mediastinal contours and pulmonary vascularity normal. Atherosclerotic calcification aorta. Bronchitic changes with subsegmental atelectasis LEFT base. Lungs otherwise clear. No pleural effusion or pneumothorax. Bones demineralized. IMPRESSION: Bronchitic changes with subsegmental atelectasis at LEFT base. Electronically Signed   By:  Lavonia Dana M.D.   On: 08/27/2018 17:56    ROS - all of the below systems have been reviewed with the patient and positives are indicated with bold text General: chills, fever or night sweats Eyes: blurry vision or double vision ENT: epistaxis or sore throat Allergy/Immunology: itchy/watery eyes or nasal congestion Hematologic/Lymphatic: bleeding problems, blood clots or swollen lymph nodes Endocrine: temperature intolerance or unexpected weight changes Breast: new or changing breast lumps or nipple discharge Resp: cough, shortness of breath, or wheezing CV: chest pain or dyspnea on exertion GI: as per HPI GU: dysuria, trouble voiding, or hematuria MSK: joint pain or joint stiffness Neuro: TIA or stroke symptoms Derm: pruritus and skin lesion changes Psych: anxiety and depression  PE Blood pressure (!) 171/107, pulse 70, temperature 98.5 F (36.9 C), temperature source Oral, resp. rate 18, SpO2 94 %. Constitutional: NAD; conversant; no deformities Eyes: Moist conjunctiva; no lid lag; anicteric; PERRL Neck: Trachea midline; no thyromegaly Lungs: Normal respiratory effort; no tactile fremitus CV: RRR; no palpable thrills; no pitting edema GI: Abd with midline scar; soft, moderately distended; nontender; no rebound/guarding; no palpable hepatosplenomegaly MSK: Normal gait; no clubbing/cyanosis Psychiatric: Appropriate affect; alert and oriented x3 Lymphatic: No palpable cervical or axillary lymphadenopathy  Results for orders placed or performed during the hospital  encounter of 08/27/18 (from the past 48 hour(s))  Brain natriuretic peptide     Status: Abnormal   Collection Time: 08/27/18  5:38 PM  Result Value Ref Range   B Natriuretic Peptide 228.8 (H) 0.0 - 100.0 pg/mL    Comment: Performed at [facility], [address], [place], [place] [address]  CBC     Status: Abnormal   Collection Time: 08/27/18  6:07 PM  Result Value Ref Range   WBC 11.8 (H) 4.0 - 10.5 K/uL   RBC 3.81 (L) 3.87 - 5.11 MIL/uL   Hemoglobin 12.6 12.0 - 15.0 g/dL   HCT 38.3 36.0 - 46.0 %   MCV 100.5 (H) 80.0 - 100.0 fL   MCH 33.1 26.0 - 34.0 pg   MCHC 32.9 30.0 - 36.0 g/dL   RDW 19.2 (H) 11.5 - 15.5 %   Platelets 219 150 - 400 K/uL   nRBC 0.0 0.0 - 0.2 %    Comment: Performed at [facility] [address], [place], [place] [address]  Comprehensive metabolic panel     Status: Abnormal   Collection Time: 08/27/18  6:07 PM  Result Value Ref Range   Sodium 134 (L) 135 - 145 mmol/L   Potassium 4.3 3.5 - 5.1 mmol/L   Chloride 99 98 - 111 mmol/L   CO2 26 22 - 32 mmol/L   Glucose, Bld 171 (H) 70 - 99 mg/dL   BUN 13 8 - 23 mg/dL   Creatinine, Ser 0.73 0.44 - 1.00 mg/dL   Calcium 9.4 8.9 - 10.3 mg/dL   Total Protein 6.3 (L) 6.5 - 8.1 g/dL   Albumin 3.6 3.5 - 5.0 g/dL   AST 19 15 - 41 U/L   ALT 17 0 - 44 U/L   Alkaline Phosphatase 75 38 - 126 U/L   Total Bilirubin 0.8 0.3 - 1.2 mg/dL   GFR calc non Af Amer >60 >60 mL/min   GFR calc Af Amer >60 >60 mL/min   Anion gap 9 5 - 15    Comment: Performed at [facility] [address] [place], [place] [address]  Lipase, blood     Status:  None   Collection Time: 08/27/18  6:07 PM  Result Value Ref Range   Lipase 27 11 - 51 U/L    Comment: Performed at [facility], [address] [place], [place] [address]  Protime-INR     Status: Abnormal   Collection Time: 08/27/18  6:07 PM  Result Value Ref Range   Prothrombin Time 17.2 (H) 11.4 - 15.2 seconds   INR 1.4 (H) 0.8 - 1.2    Comment: (NOTE) INR  goal varies based on device and disease states. Performed at [facility], [place] [address], [place], [place] [address]     US Abdomen Complete  Result Date: 08/27/2018 CLINICAL DATA:  Abdominal swelling EXAM: ABDOMEN ULTRASOUND COMPLETE COMPARISON:  CT abdomen pelvis July 13, 2018 FINDINGS: Technically difficult exam due to extensive bowel gas which obscures portions of the abdomen is noted low. Gallbladder: Multiple echogenic, post of the shadowing gallstones are present. Largest measures up to 1.9 cm in size. No wall thickening visualized. No sonographic Murphy sign noted by sonographer. Common bile duct: Diameter: 5.5 mm Liver: Portions of the right lobe liver obscured by bowel gas. An enhancing lesion along the false form ligament is not well visualized on this study. Within normal limits in parenchymal echogenicity. Portal vein is patent on color Doppler imaging with normal direction of blood flow towards the liver. IVC: Not well visualized due to bowel gas. Pancreas: Not visualized due to bowel gas. Spleen: Size (4.4 cm) and appearance within normal limits. Right Kidney: Length: 9.7. Echogenicity within normal limits. No mass or hydronephrosis visualized. Left Kidney: Length: 8.3 cm. Echogenicity within normal limits. No mass or hydronephrosis visualized. Lobular contours Lunt reflect fetal lobulation versus scarring. Abdominal aorta: No aneurysm visualized. Other findings: Trace right pleural effusion. Right adrenal mass measuring approximately 5.8 x 3.6 x 4.9 cm. IMPRESSION: Technically difficult exam due to bowel gas. Right adrenal mass measuring up to 5.8 cm in size, similar dimensions 2 most recent CT. A previously identified left adrenal mass and enhancing lesion along the falciform ligament are not well visualized. Cholelithiasis without evidence of acute cholecystitis. Lobular appearance of left kidney [place] reflect fetal lobulation versus scarring. Electronically Signed   By: Lovena Le  M.D.   On: 08/27/2018 18:45   Ct Abdomen Pelvis W Contrast  Result Date: 08/27/2018 CLINICAL DATA:  Abdominal distension EXAM: CT ABDOMEN AND PELVIS WITH CONTRAST TECHNIQUE: Multidetector CT imaging of the abdomen and pelvis was performed using the standard protocol following bolus administration of intravenous contrast. CONTRAST:  178mL OMNIPAQUE IOHEXOL 300 MG/ML  SOLN COMPARISON:  July 13, 2018 FINDINGS: Lower chest: Small left pleural effusion is identified. Mild atelectasis of posterior left lung base is noted. The heart size is enlarged. Hepatobiliary: There is stable intra and extrahepatic biliary ductal dilatation. The gallbladder is stable. There is a 2.4 cm enhancing lesion near the falciform ligament unchanged compared prior exam. No other focal liver lesion is identified. Pancreas: Unremarkable. No pancreatic ductal dilatation or surrounding inflammatory changes. Spleen: Normal in size without focal abnormality. Adrenals/Urinary Tract: Stable 5.5 cm fatty right adrenal lesion is identified consistent with myelolipoma. This is unchanged. 3.1 cm left adrenal mass is unchanged compared prior exam. There is atrophy of the left kidney. There is no hydronephrosis bilaterally. The bladder is partially decompressed without gross abnormality. Stomach/Bowel: There are multiple dilated small bowel loops throughout the abdomen and pelvis without clear transition. The left colon and sigmoid colon are decompressed. There is stool and air  identified in the right colon and rectal sigmoid colon. The appendix not definitely seen but no inflammation is noted around the cecum. There is a small hiatal hernia. Vascular/Lymphatic: Aortic atherosclerosis. No enlarged abdominal or pelvic lymph nodes. Reproductive: Uterus and bilateral adnexa are unremarkable. Other: None. Musculoskeletal: There are degenerative joint changes the spine. There is further loss of height of compression fracture at L2 compared to prior exam of  July 13, 2018, superimposed acute fracture is not excluded. IMPRESSION: Multiple dilated small bowel loops in the abdomen and pelvis. There is air and stool identified in the colon. The findings [place] be due to early or partial small bowel obstruction. Follow-up is recommended. Further loss of vertebral body height with compression fracture at L2 compared to prior exam of July 13, 2018, superimposed acute fracture is not excluded. Electronically Signed   By: Abelardo Diesel M.D.   On: 08/27/2018 20:14   Dg Chest Portable 1 View  Result Date: 08/27/2018 CLINICAL DATA:  Shortness of breath, abdominal swelling EXAM: PORTABLE CHEST 1 VIEW COMPARISON:  Portable exam 1733 hours compared to 07/13/2018 FINDINGS: Left subclavian sequential transvenous pacemaker leads project at right atrium and right ventricle. Minimal enlargement of cardiac silhouette. Mediastinal contours and pulmonary vascularity normal. Atherosclerotic calcification aorta. Bronchitic changes with subsegmental atelectasis LEFT base. Lungs otherwise clear. No pleural effusion or pneumothorax. Bones demineralized. IMPRESSION: Bronchitic changes with subsegmental atelectasis at LEFT base. Electronically Signed   By: Lavonia Dana M.D.   On: 08/27/2018 17:56    A/P: Colbie Sliker Smeltz is an 85 y.o. female with hx of HTN, DM, afib, aortic insuff, diastolic CHF, sick sinus syndrome - here with possible pSBO  -Suspect likely underlying constipation given lack of any clear transition point, no n/v, and stool burden noted on CT but could also have some degree of pSBO given surgical hx... -Would keep NPO, MIVF, fleets enemas and reassess response -Would also hold off on NG tube at this time unless she were to develop significant nausea or emesis -I attempted to discuss everything with her daughter as well but have been unable to reach anyone on two separate attempts  Sharon Mt. Dema Severin, M.D. [facility]

## 2018-08-27 NOTE — ED Provider Notes (Signed)
[facility] EMERGENCY DEPARTMENT Provider Note   CSN: [id number] Arrival date & time: 08/27/18  1712    History   Chief Complaint Chief Complaint  Patient presents with  . Abdominal Pain  . Shortness of Breath    HPI Jo [name] is a 85 y.o. female.     HPI Patient presents to the emergency room for evaluation of abdominal swelling and shortness of breath.  Patient states she started having the symptoms a few days ago.  Patient states that her abdomen has felt bloated and she has been feeling short of breath.  She denies any leg swelling.  She denies any coughing.  She denies any fevers or chills.  No vomiting or diarrhea.  She denies any pain in her chest or abdomen.  Patient states she has been taking Lasix at home without significant relief.  She came to the ED today because of her worsening symptoms. Past Medical History:  Diagnosis Date  . Anxiety   . Arthritis    "hands; between shoulders; back; feet"  . Chronic anticoagulation    -->Eliquis  . Chronic diastolic CHF (congestive heart failure) ([place])    a. 06/2011 Echo: EF 60-65%.  . Depression   . Essential hypertension   . High risk medication use    on Flecainide  . Kidney stone    passed in [place] 2014  . Moderate aortic insufficiency    a. 06/2011 Echo: EF 60-65%, mild LVH, no rwma, mod AI.  Marland Kitchen Osteoporosis   . Osteoporosis   . PAF (paroxysmal atrial fibrillation) (HCC)    a. chronic flecainide->reduced to 25 bid 08/2014.  Marland Kitchen Panic attacks    "since losing husband 1994"  . Skin cancer ?1960's   "between breasts"  . SSS (sick sinus syndrome) (HCC)    a. s/p pacemaker. b. s/p Medtronic gen change 07/2011; c.   . Type II diabetes mellitus ([place])    "controlled w/diet"    Patient Active Problem List   Diagnosis Date Noted  . Acute on chronic diastolic (congestive) heart failure ([place]) 06/24/2018  . Shortness of breath 06/23/2018  . Acute on chronic diastolic heart failure ([place]) 06/23/2018  .  Atrial fibrillation with rapid ventricular response ([place]) 06/04/2017  . UTI (urinary tract infection) 08/30/2016  . Weakness 08/29/2016  . Acute lower UTI 08/29/2016  . Abdominal distention   . CAP (community acquired pneumonia) 06/27/2015  . Sepsis ([place]) 06/27/2015  . Pacemaker 06/27/2015  . Moderate aortic insufficiency   . Chronic diastolic CHF (congestive heart failure) ([place])   . Breast cancer ([place]) 02/03/2014  . Dyslipidemia 08/15/2012  . Atrophic vaginitis 08/15/2012  . Constipation 08/15/2012  . Vitamin D intoxication 08/15/2012  . Acute on chronic diastolic HF (heart failure) ([place]) 07/06/2012  . Atrial fibrillation with RVR ([place]) 07/21/2011  . Diabetes mellitus, type II ([place])   . Panic attacks   . Anxiety   . PAF (paroxysmal atrial fibrillation) ([place])   . SSS (sick sinus syndrome) ([place])   . History of aortic insufficiency   . Hypertension     Past Surgical History:  Procedure Laterality Date  . APPENDECTOMY  1955  . BREAST CYST EXCISION  ~ 1950   right  . BREAST LUMPECTOMY WITH NEEDLE LOCALIZATION Bilateral 02/03/2014   Procedure: BILATERAL BREAST LUMPECTOMY WITH NEEDLE LOCALIZATION ON LEFT;  Surgeon: Excell Seltzer, MD;  Location: [facility];  Service: General;  Laterality: Bilateral;  . CARDIOVASCULAR STRESS TEST  12/09/2004  . CATARACT EXTRACTION W/  INTRAOCULAR LENS  IMPLANT, BILATERAL  1990's  . [place]  . DILATION AND CURETTAGE OF UTERUS  1953  . INSERT / REPLACE / REMOVE PACEMAKER  03/16/2006  . PERMANENT PACEMAKER GENERATOR CHANGE  08/03/2011   Procedure: PERMANENT PACEMAKER GENERATOR CHANGE;  Surgeon: Thompson Grayer, MD;  Location: [facility] CATH LAB;  Service: Cardiovascular;;  . SKIN CANCER EXCISION  ?1960's   "between my breasts"  . TONSILLECTOMY     "school age"     OB History   No obstetric history on file.      Home Medications    Prior to Admission medications   Medication Sig Start Date End Date Taking? Authorizing Provider   acetaminophen (TYLENOL) 500 MG tablet Take 500-1,000 mg by mouth every 6 (six) hours as needed for mild pain, moderate pain, fever or headache.    [provider]  amiodarone (PACERONE) 200 MG tablet TAKE 1 TABLET BY MOUTH EVERY DAY Patient taking differently: Take 200 mg by mouth daily.  02/21/18   Sherran Needs, NP  apixaban (ELIQUIS) 2.5 MG TABS tablet Take 1 tablet (2.5 mg total) by mouth 2 (two) times daily. 01/28/14   Martin[place], Peter M, MD  azelastine (ASTELIN) 0.1 % nasal spray Place 1 spray into both nostrils daily as needed for rhinitis. Use in each nostril as directed    [provider]  carvedilol (COREG) 6.25 MG tablet Take 6.25 mg by mouth 2 (two) times daily with a meal. Hold for SBP<110 OR HR <60    [provider]  CRESTOR 5 MG tablet Take 2.5 mg by mouth every Monday, Wednesday, and Friday.  07/25/14   [provider]  diltiazem (CARDIZEM CD) 240 MG 24 hr capsule Take 240 mg by mouth daily.  09/16/14   [provider]  estradiol (ESTRACE) 0.1 MG/GM vaginal cream Place 1 Applicatorful vaginally every Monday, Wednesday, and Friday.    [provider]  feeding supplement, ENSURE ENLIVE, (ENSURE ENLIVE) LIQD Take 237 mLs by mouth 2 (two) times daily between meals. Patient taking differently: Take 237 mLs by mouth 2 (two) times daily as needed (nutrition).  06/07/17   Ghimire, Henreitta Leber, MD  furosemide (LASIX) 40 MG tablet Take 1 tablet (40 mg total) by mouth daily for 30 days. 06/28/18 07/28/18  Shelly Coss, MD  JANUVIA 100 MG tablet Take 100 mg by mouth daily. 08/12/17   [provider]  LORazepam (ATIVAN) 0.5 MG tablet Take 0.5-1 tablets (0.25-0.5 mg total) by mouth See admin instructions. Take 0.25 mg tablet in the morning and afternoon, then 0.5 mg at bedtime 06/07/17   Ghimire, Henreitta Leber, MD  polyethylene glycol (MIRALAX / Floria Raveling) packet Take 17 g by mouth 2 (two) times daily as needed for mild constipation.     [provider]  potassium chloride SA (K-DUR) 20 MEQ tablet Take 1 tablet (20 mEq total) by mouth daily. 06/27/18   Shelly Coss, MD  sertraline (ZOLOFT) 50 MG tablet Take 50 mg by mouth daily. 07/20/14   [provider]  Vitamin D, Ergocalciferol, (DRISDOL) 50000 UNITS CAPS capsule Take 50,000 Units by mouth every Wednesday.    [provider]    Family History Family History  Problem Relation Age of Onset  . Emphysema Father     Social History Social History   Tobacco Use  . Smoking status: Never Smoker  . Smokeless tobacco: Never Used  Substance Use Topics  . Alcohol use: No  . Drug use:  No     Allergies   Sulfonamide derivatives and Citalopram hydrobromide   Review of Systems Review of Systems  All other systems reviewed and are negative.    Physical Exam Updated Vital Signs BP (!) 144/68 (BP Location: Right Arm)   Pulse 69   Temp 98.5 F (36.9 C) (Oral)   Resp 18   SpO2 96%   Physical Exam Vitals signs and nursing note reviewed.  Constitutional:      General: She is not in acute distress.    Appearance: She is well-developed.     Comments: Elderly, frail  HENT:     Head: Normocephalic and atraumatic.     Right Ear: External ear normal.     Left Ear: External ear normal.  Eyes:     General: No scleral icterus.       Right eye: No discharge.        Left eye: No discharge.     Conjunctiva/sclera: Conjunctivae normal.  Neck:     Musculoskeletal: Neck supple.     Trachea: No tracheal deviation.  Cardiovascular:     Rate and Rhythm: Normal rate and regular rhythm.  Pulmonary:     Effort: Pulmonary effort is normal. No respiratory distress.     Breath sounds: Normal breath sounds. No stridor. No wheezing or rales.  Abdominal:     General: Abdomen is protuberant. Bowel sounds are normal. There is distension.     Palpations: Abdomen is soft.     Tenderness: There is no abdominal tenderness. There is no guarding or rebound.      Comments: Abdomen protuberant and distended  Musculoskeletal:        General: No tenderness.     Comments: No edema noted in the extremities  Skin:    General: Skin is warm and dry.     Findings: No rash.  Neurological:     Mental Status: She is alert.     Cranial Nerves: No cranial nerve deficit (no facial droop, extraocular movements intact, no slurred speech).     Sensory: No sensory deficit.     Motor: No abnormal muscle tone or seizure activity.     Coordination: Coordination normal.      ED Treatments / Results  Labs (all labs ordered are listed, but only abnormal results are displayed) Labs Reviewed  CBC - Abnormal; Notable for the following components:      Result Value   WBC 11.8 (*)    RBC 3.81 (*)    MCV 100.5 (*)    RDW 19.2 (*)    All other components within normal limits  COMPREHENSIVE METABOLIC PANEL - Abnormal; Notable for the following components:   Sodium 134 (*)    Glucose, Bld 171 (*)    Total Protein 6.3 (*)    All other components within normal limits  PROTIME-INR - Abnormal; Notable for the following components:   Prothrombin Time 17.2 (*)    INR 1.4 (*)    All other components within normal limits  LIPASE, BLOOD  BRAIN NATRIURETIC PEPTIDE    EKG EKG Interpretation  Date/Time:  Monday August 27 2018 17:12:51 EDT Ventricular Rate:  73 PR Interval:    QRS Duration: 101 QT Interval:  419 QTC Calculation: 462 R Axis:   -14 Text Interpretation:  Sinus rhythm Prolonged PR interval Probable left ventricular hypertrophy Inferior infarct, recent Lateral leads are also involved sinus rhythm replaced paced rhythm Confirmed by Dorie Rank [phone]) on 08/27/2018 5:17:43 PM  Radiology US Abdomen Complete  Result Date: 08/27/2018 CLINICAL DATA:  Abdominal swelling EXAM: ABDOMEN ULTRASOUND COMPLETE COMPARISON:  CT abdomen pelvis July 13, 2018 FINDINGS: Technically difficult exam due to extensive bowel gas which obscures portions of the abdomen is noted low.  Gallbladder: Multiple echogenic, post of the shadowing gallstones are present. Largest measures up to 1.9 cm in size. No wall thickening visualized. No sonographic Murphy sign noted by sonographer. Common bile duct: Diameter: 5.5 mm Liver: Portions of the right lobe liver obscured by bowel gas. An enhancing lesion along the false form ligament is not well visualized on this study. Within normal limits in parenchymal echogenicity. Portal vein is patent on color Doppler imaging with normal direction of blood flow towards the liver. IVC: Not well visualized due to bowel gas. Pancreas: Not visualized due to bowel gas. Spleen: Size (4.4 cm) and appearance within normal limits. Right Kidney: Length: 9.7. Echogenicity within normal limits. No mass or hydronephrosis visualized. Left Kidney: Length: 8.3 cm. Echogenicity within normal limits. No mass or hydronephrosis visualized. Lobular contours Lib[place] reflect fetal lobulation versus scarring. Abdominal aorta: No aneurysm visualized. Other findings: Trace right pleural effusion. Right adrenal mass measuring approximately 5.8 x 3.6 x 4.9 cm. IMPRESSION: Technically difficult exam due to bowel gas. Right adrenal mass measuring up to 5.8 cm in size, similar dimensions 2 most recent CT. A previously identified left adrenal mass and enhancing lesion along the falciform ligament are not well visualized. Cholelithiasis without evidence of acute cholecystitis. Lobular appearance of left kidney Maggart reflect fetal lobulation versus scarring. Electronically Signed   By: Lovena Le M.D.   On: 08/27/2018 18:45   Dg Chest Portable 1 View  Result Date: 08/27/2018 CLINICAL DATA:  Shortness of breath, abdominal swelling EXAM: PORTABLE CHEST 1 VIEW COMPARISON:  Portable exam 1733 hours compared to 07/13/2018 FINDINGS: Left subclavian sequential transvenous pacemaker leads project at right atrium and right ventricle. Minimal enlargement of cardiac silhouette. Mediastinal contours and  pulmonary vascularity normal. Atherosclerotic calcification aorta. Bronchitic changes with subsegmental atelectasis LEFT base. Lungs otherwise clear. No pleural effusion or pneumothorax. Bones demineralized. IMPRESSION: Bronchitic changes with subsegmental atelectasis at LEFT base. Electronically Signed   By: Lavonia Dana M.D.   On: 08/27/2018 17:56    Procedures Procedures (including critical care time)  Medications Ordered in ED Medications - No data to display   Initial Impression / Assessment and Plan / ED Course  I have reviewed the triage vital signs and the nursing notes.  Pertinent labs & imaging results that were available during my care of the patient were reviewed by me and considered in my medical decision making (see chart for details).  Clinical Course as of Aug 26 1849  [facility] Aug 27, 2018  1851 Ultrasound does not show have any evidence of ascites.  Chest x-ray without signs of pneumonia or pulmonary edema.   [JK]    Clinical Course User Index [JK] Dorie Rank, MD     Patient presents with complaints of abdominal bloating and shortness of breath.  She has been treating with diuretic although her chest x-ray does not show any evidence of pulmonary edema.  Laboratory tests are notable for slight increase in her white count but no abnormal liver function tests or lipase.  Ultrasound does not show ascites.  Patient's BNP is pending. Will add on CT regarding her abd bloating.  PA  [place] will follow up on result.  Final Clinical Impressions(s) / ED Diagnoses  pending   Dorie Rank, MD  08/27/18 1854  

## 2018-08-27 NOTE — ED Provider Notes (Signed)
Patient's care handed off from previous provider, Dr. Dorie Rank with CT on pelvis and BMP pending.   85 year old female who presents for evaluation of abdominal swelling and shortness of breath.  This is been ongoing for the last few days.  She states that she feels like her abdomen has been bloated.  She has not had any swelling in her legs.  No history of vomiting or diarrhea.  Denies any chest pain or difficulty breathing.  Please see note for further history/physical exam from previous provider.   Physical Exam  BP (!) 171/107   Pulse 70   Temp 98.5 F (36.9 C) (Oral)   Resp 18   SpO2 94%   Physical Exam   Abdomen is distended.  Generalized tenderness noted.  ED Course/Procedures   Clinical Course as of Aug 27 2134  [facility] Aug 27, 2018  1851 Ultrasound does not show have any evidence of ascites.  Chest x-ray without signs of pneumonia or pulmonary edema.   [JK]    Clinical Course User Index [JK] Dorie Rank, MD    Procedures  MDM   Plan: Lipase unremarkable.  CBC shows slight leukocytosis of 11.8.  Hemoglobin stable at 12.6.  CMP is unremarkable.  Lipase is within normal limits. INR of 1.4   Ultrasound showed adrenal mass noted at 5.8 cm.  Cholelithiasis without any evidence of acute cholecystitis.  Additionally there is extensive bowel gas that made exam limited.  CT on pelvis ordered for from previous provider for further evaluation.  If CT on pelvis concerning, plan for admission.  BNP is also pending.  If CT and pelvis is reassuring and shows only constipation, plan for supportive care measures, consult with family and determine disposition.  BNP is slightly elevated at 228.8.  DTM pelvis shows multiple dilated small bowel loops in the abdomen pelvis.  There is air and stool identified in the colon.  Findings Velez be due to early or partial small bowel obstruction.  Reevaluation.  Given patient's age and findings on CT scan, will plan for admission.  Discussed patient  with Dr. Erlinda Hong (hospitalist). Plan for admission. Would like gen surgery consult.   Discussed patient with Dr. Dema Severin (Gen Surg). Recommends holding off on NG tube at this time and possibly doing an enema.   Portions of this note were generated with Lobbyist. Dictation errors [place] occur despite best attempts at proofreading.   1. Partial small bowel obstruction [facility])         Desma Mcgregor 08/27/18 2136    Dorie Rank, MD 08/28/18 [phone]

## 2018-08-27 NOTE — ED Triage Notes (Signed)
Pt presents to the ED with complaints of abdominal swelling and SOB X3 days. Pt denies pain in triage. Pt very hard of hearing. EMS reports family giving lasix to pt with no relief.

## 2018-08-28 ENCOUNTER — Inpatient Hospital Stay (HOSPITAL_COMMUNITY): Payer: Medicare Other

## 2018-08-28 ENCOUNTER — Encounter (HOSPITAL_COMMUNITY): Payer: Self-pay | Admitting: General Practice

## 2018-08-28 LAB — CBC
HCT: 36.6 % (ref 36.0–46.0)
Hemoglobin: 12.3 g/dL (ref 12.0–15.0)
MCH: 32.9 pg (ref 26.0–34.0)
MCHC: 33.6 g/dL (ref 30.0–36.0)
MCV: 97.9 fL (ref 80.0–100.0)
Platelets: 222 10*3/uL (ref 150–400)
RBC: 3.74 MIL/uL — ABNORMAL LOW (ref 3.87–5.11)
RDW: 19 % — ABNORMAL HIGH (ref 11.5–15.5)
WBC: 12.4 10*3/uL — ABNORMAL HIGH (ref 4.0–10.5)
nRBC: 0 % (ref 0.0–0.2)

## 2018-08-28 LAB — BASIC METABOLIC PANEL
Anion gap: 11 (ref 5–15)
BUN: 12 mg/dL (ref 8–23)
CO2: 26 mmol/L (ref 22–32)
Calcium: 9.4 mg/dL (ref 8.9–10.3)
Chloride: 98 mmol/L (ref 98–111)
Creatinine, Ser: 0.7 mg/dL (ref 0.44–1.00)
GFR calc Af Amer: >60 mL/min (ref >60)
GFR calc non Af Amer: >60 mL/min (ref >60)
Glucose, Bld: 160 mg/dL — ABNORMAL HIGH (ref 70–99)
Potassium: 4.1 mmol/L (ref 3.5–5.1)
Sodium: 135 mmol/L (ref 135–145)

## 2018-08-28 LAB — SARS CORONAVIRUS 2 (TAT 6-24 HRS): SARS Coronavirus 2: NEGATIVE

## 2018-08-28 LAB — TYPE AND SCREEN
ABO/RH(D): O POS
Antibody Screen: NEGATIVE

## 2018-08-28 LAB — GLUCOSE, CAPILLARY
Glucose-Capillary: 159 mg/dL — ABNORMAL HIGH (ref 70–99)
Glucose-Capillary: 126 mg/dL — ABNORMAL HIGH (ref 70–99)
Glucose-Capillary: 144 mg/dL — ABNORMAL HIGH (ref 70–99)
Glucose-Capillary: 113 mg/dL — ABNORMAL HIGH (ref 70–99)
Glucose-Capillary: 127 mg/dL — ABNORMAL HIGH (ref 70–99)

## 2018-08-28 LAB — APTT: aPTT: 36 seconds (ref 24–36)

## 2018-08-28 LAB — ABO/RH: ABO/RH(D): O POS

## 2018-08-28 MED ORDER — FLEET ENEMA 7-19 GM/118ML RE ENEM
1.0000 | ENEMA | Freq: Once | RECTAL | Status: AC
  Administered 2018-08-28: 1 via RECTAL
  Filled 2018-08-28: qty 1

## 2018-08-28 MED ORDER — ORAL CARE MOUTH RINSE
15.0000 mL | Freq: Two times a day (BID) | OROMUCOSAL | Status: DC
  Administered 2018-08-28 – 2018-09-02 (×10): 15 mL via OROMUCOSAL

## 2018-08-28 NOTE — Progress Notes (Signed)
PROGRESS NOTE    Jo [name]  BJS:[id number] DOB: 12-22-1917 DOA: 08/27/2018 PCP: Crist Infante, MD    Brief Narrative:   Jo [name] is a 85 y.o. female with medical history significant of hypertension, hyperlipidemia, diabetes mellitus, SSS, s/p of pacemaker placement, PAF on Eliquis, dCHF, anxiety, HOH, partial small bowel obstruction, who presents with abdominal distention and shortness of breath.  Per pt's daughter (I called her daughter by phone), patient has been having abdominal swelling and abdominal distention for several days.  Patient does not seem to seem to have abdominal pain or active vomiting per her daughter.  Last bowel movement was 2 days ago.  Her daughter states that they treated her with laxatives including MiraLAX and pt had some bowel movement. Pt also has shortness of breath, but no cough, fever, chills, chest pain.  Daughter states that patient has gained approximately 3 pounds last week.  Patient was recently hospitalized from 5/30-6/3 due to CHF exacerbation.  After she went home, she did not need oxygen, but today patient had oxygen desaturation to 88% on room air, and required 2 L nasal cannula oxygen at home per her daughter.  Patient has a generalized weakness, but no unilateral tenderness or numbness.  No facial droop or slurred speech no symptoms of UTI.  Patient seems to have right hip pain per her daughter.  ED Course: pt was found to have BNP 228, lipase 27, LFT normal, creatinine BUN normal, WBC 11.8, temperature normal, blood pressure 171/107, heart rate 70, oxygen saturation 94%.  Chest x-ray showed a bronchitic change with possible left base atelectasis. Pt is admitted to tele bed as inpt.  Assessment & Plan:   Principal Problem:   Partial small bowel obstruction (HCC) Active Problems:   PAF (paroxysmal atrial fibrillation) (HCC)   Hypertension   Diabetes mellitus without complication (HCC)   Anxiety   Dyslipidemia   Chronic diastolic CHF (congestive  heart failure) (HCC)   Possible partial small bowel obstruction vs constipation causing ileus Patient presenting with progressive abdominal distention.  Thought to be initially from volume overload by family members.  CT abdomen/pelvis shows multiple dilated small bowel loops consistent with early versus partial small bowel obstruction. --General surgery following, appreciate assistance --Try to avoid invasive procedures such as NG tube or surgical intervention --s/p fleets enema last night with multiple bowel movements; surgery plans to repeat enema today --Repeat KUB today per surgery --Continue n.p.o. status --PT to assist with mobilization --Further per general surgery  PAF (paroxysmal atrial fibrillation) CHA2DS2-VASc Score is 6. Patient is on Eliquis at home. Heart rate is well controlled. --holding Eliquis in case pt needs surgery  --continue amiodarone, Coreg, Cardizem  Essential hypertension: --Continue Coreg and Cardizem --IV hydralazine as needed   Diabetes mellitus without complication Last Y0V 7.6.. Patient is taking Januvia at home --Hold home medication regimen --Insulin sliding scale for coverage while inpatient  Anxiety: --Continue home ativan prn  Dyslipidemia: --Crestor  Chronic diastolic CHF (congestive heart failure) 2D echo on 06/05/2017 showed EF 65-70%.  BNP slightly elevated 228.  Patient has worsening shortness of breath.  She Caf[place]lla have a mild CHF exacerbation. --will continue oral lasix 40 mg daily --Daily weights, strict I's and O's  Adrenal mass:  US showed a right adrenal mass measuring up to 5.8 cm in size, similar dimensions 2 most recent CT. A previously identified left adrenal mass and enhancing lesion along the falciform ligament are not well visualized. --Recommend follow-up outpatient with PCP, given her  advanced age, likely not optimal surgical candidate   DVT prophylaxis: SCDs Code Status: DNR Family Communication: Updated  patient's daughter, [name] by telephone regarding plan of care this afternoon Disposition Plan: Continue inpatient hospitalization, further dependent on clinical course, pending sign off from general surgery, anticipate discharge back home when medically ready   Consultants:   General surgery  Procedures:   None  Antimicrobials:   None   Subjective: Patient seen and examined at bedside, resting comfortably.  Continues with abdominal distention.  Reports multiple bowel movements overnight with enema.  No other specific complaints or concerns at this time.  Denies headache, no fever/chills/night sweats, no nausea/vomiting/diarrhea, no chest pain, no palpitations, no bowel pain, no weakness, no issues with bladder function, no paresthesias.  No acute events overnight per nursing staff.  Objective: Vitals:   08/28/18 0348 08/28/18 0802 08/28/18 0939 08/28/18 1206  BP: (!) 146/66 (!) 130/54  (!) 153/60  Pulse: 65 63  63  Resp: 18 18  18   Temp: 98.3 F (36.8 C) 98.1 F (36.7 C)  98.2 F (36.8 C)  TempSrc: Oral Oral  Oral  SpO2: 95% 96% 94% 98%  Weight: 54.8 kg     Height:        Intake/Output Summary (Last 24 hours) at 08/28/2018 1230 Last data filed at 08/28/2018 1211 Gross per 24 hour  Intake 30 ml  Output 700 ml  Net -670 ml   Filed Weights   08/27/18 2218 08/28/18 0348  Weight: 54 kg 54.8 kg    Examination:  General exam: Appears calm and comfortable  Respiratory system: Clear to auscultation. Respiratory effort normal. Cardiovascular system: S1 & S2 heard, RRR. No JVD, murmurs, rubs, gallops or clicks. No pedal edema. Gastrointestinal system: Abdomen is distended, soft and nontender. No organomegaly or masses felt.  Faint bowel sounds present. Central nervous system: Alert and oriented. No focal neurological deficits. Extremities: Symmetric 5 x 5 power. Skin: No rashes, lesions or ulcers Psychiatry: Judgement and insight appear normal. Mood & affect appropriate.      Data Reviewed: I have personally reviewed following labs and imaging studies  CBC: Recent Labs  Lab 08/27/18 1807 08/28/18 0025  WBC 11.8* 12.4*  HGB 12.6 12.3  HCT 38.3 36.6  MCV 100.5* 97.9  PLT 219 341   Basic Metabolic Panel: Recent Labs  Lab 08/27/18 1807 08/28/18 0025  NA 134* 135  K 4.3 4.1  CL 99 98  CO2 26 26  GLUCOSE 171* 160*  BUN 13 12  CREATININE 0.73 0.70  CALCIUM 9.4 9.4   GFR: Estimated Creatinine Clearance: 30.3 mL/min (by C-G formula based on SCr of 0.7 mg/dL). Liver Function Tests: Recent Labs  Lab 08/27/18 1807  AST 19  ALT 17  ALKPHOS 75  BILITOT 0.8  PROT 6.3*  ALBUMIN 3.6   Recent Labs  Lab 08/27/18 1807  LIPASE 27   No results for input(s): AMMONIA in the last 168 hours. Coagulation Profile: Recent Labs  Lab 08/27/18 1807  INR 1.4*   Cardiac Enzymes: No results for input(s): CKTOTAL, CKMB, CKMBINDEX, TROPONINI in the last 168 hours. BNP (last 3 results) No results for input(s): PROBNP in the last 8760 hours. HbA1C: No results for input(s): HGBA1C in the last 72 hours. CBG: Recent Labs  Lab 08/28/18 0023 08/28/18 0613 08/28/18 1207  GLUCAP 159* 126* 144*   Lipid Profile: No results for input(s): CHOL, HDL, LDLCALC, TRIG, CHOLHDL, LDLDIRECT in the last 72 hours. Thyroid Function Tests: No results for input(s):  TSH, T4TOTAL, FREET4, T3FREE, THYROIDAB in the last 72 hours. Anemia Panel: No results for input(s): VITAMINB12, FOLATE, FERRITIN, TIBC, IRON, RETICCTPCT in the last 72 hours. Sepsis Labs: No results for input(s): PROCALCITON, LATICACIDVEN in the last 168 hours.  Recent Results (from the past 240 hour(s))  SARS CORONAVIRUS 2 Nasal Swab Aptima Multi Swab     Status: None   Collection Time: 08/27/18  8:51 PM   Specimen: Aptima Multi Swab; Nasal Swab  Result Value Ref Range Status   SARS Coronavirus 2 NEGATIVE NEGATIVE Final    Comment: (NOTE) SARS-CoV-2 target nucleic acids are NOT DETECTED. The  SARS-CoV-2 RNA is generally detectable in upper and lower respiratory specimens during the acute phase of infection. Negative results do not preclude SARS-CoV-2 infection, do not rule out co-infections with other pathogens, and should not be used as the sole basis for treatment or other patient management decisions. Negative results must be combined with clinical observations, patient history, and epidemiological information. The expected result is Negative. Fact Sheet for Patients: SugarRoll.be Fact Sheet for Healthcare Providers: https://www.woods-mathews.com/ This test is not yet approved or cleared by the [place] FDA and  has been authorized for detection and/or diagnosis of SARS-CoV-2 by FDA under an Emergency Use Authorization (EUA). This EUA will remain  in effect (meaning this test can be used) for the duration of the COVID-19 declaration under Section 56 4(b)(1) of the Act, 21 U.S.C. section 360bbb-3(b)(1), unless the authorization is terminated or revoked sooner. Performed at [facility], [place] [address], [place], [place] [address]          Radiology Studies: US Abdomen Complete  Result Date: 08/27/2018 CLINICAL DATA:  Abdominal swelling EXAM: ABDOMEN ULTRASOUND COMPLETE COMPARISON:  CT abdomen pelvis July 13, 2018 FINDINGS: Technically difficult exam due to extensive bowel gas which obscures portions of the abdomen is noted low. Gallbladder: Multiple echogenic, post of the shadowing gallstones are present. Largest measures up to 1.9 cm in size. No wall thickening visualized. No sonographic Murphy sign noted by sonographer. Common bile duct: Diameter: 5.5 mm Liver: Portions of the right lobe liver obscured by bowel gas. An enhancing lesion along the false form ligament is not well visualized on this study. Within normal limits in parenchymal echogenicity. Portal vein is patent on color Doppler imaging with normal direction  of blood flow towards the liver. IVC: Not well visualized due to bowel gas. Pancreas: Not visualized due to bowel gas. Spleen: Size (4.4 cm) and appearance within normal limits. Right Kidney: Length: 9.7. Echogenicity within normal limits. No mass or hydronephrosis visualized. Left Kidney: Length: 8.3 cm. Echogenicity within normal limits. No mass or hydronephrosis visualized. Lobular contours [place] reflect fetal lobulation versus scarring. Abdominal aorta: No aneurysm visualized. Other findings: Trace right pleural effusion. Right adrenal mass measuring approximately 5.8 x 3.6 x 4.9 cm. IMPRESSION: Technically difficult exam due to bowel gas. Right adrenal mass measuring up to 5.8 cm in size, similar dimensions 2 most recent CT. A previously identified left adrenal mass and enhancing lesion along the falciform ligament are not well visualized. Cholelithiasis without evidence of acute cholecystitis. Lobular appearance of left kidney [place] reflect fetal lobulation versus scarring. Electronically Signed   By: Lovena Le M.D.   On: 08/27/2018 18:45   Ct Abdomen Pelvis W Contrast  Result Date: 08/27/2018 CLINICAL DATA:  Abdominal distension EXAM: CT ABDOMEN AND PELVIS WITH CONTRAST TECHNIQUE: Multidetector CT imaging of the abdomen and pelvis was performed using the standard protocol following bolus administration  of intravenous contrast. CONTRAST:  140mL OMNIPAQUE IOHEXOL 300 MG/ML  SOLN COMPARISON:  July 13, 2018 FINDINGS: Lower chest: Small left pleural effusion is identified. Mild atelectasis of posterior left lung base is noted. The heart size is enlarged. Hepatobiliary: There is stable intra and extrahepatic biliary ductal dilatation. The gallbladder is stable. There is a 2.4 cm enhancing lesion near the falciform ligament unchanged compared prior exam. No other focal liver lesion is identified. Pancreas: Unremarkable. No pancreatic ductal dilatation or surrounding inflammatory changes. Spleen: Normal in size  without focal abnormality. Adrenals/Urinary Tract: Stable 5.5 cm fatty right adrenal lesion is identified consistent with myelolipoma. This is unchanged. 3.1 cm left adrenal mass is unchanged compared prior exam. There is atrophy of the left kidney. There is no hydronephrosis bilaterally. The bladder is partially decompressed without gross abnormality. Stomach/Bowel: There are multiple dilated small bowel loops throughout the abdomen and pelvis without clear transition. The left colon and sigmoid colon are decompressed. There is stool and air identified in the right colon and rectal sigmoid colon. The appendix not definitely seen but no inflammation is noted around the cecum. There is a small hiatal hernia. Vascular/Lymphatic: Aortic atherosclerosis. No enlarged abdominal or pelvic lymph nodes. Reproductive: Uterus and bilateral adnexa are unremarkable. Other: None. Musculoskeletal: There are degenerative joint changes the spine. There is further loss of height of compression fracture at L2 compared to prior exam of July 13, 2018, superimposed acute fracture is not excluded. IMPRESSION: Multiple dilated small bowel loops in the abdomen and pelvis. There is air and stool identified in the colon. The findings [place] be due to early or partial small bowel obstruction. Follow-up is recommended. Further loss of vertebral body height with compression fracture at L2 compared to prior exam of July 13, 2018, superimposed acute fracture is not excluded. Electronically Signed   By: Abelardo Diesel M.D.   On: 08/27/2018 20:14   Dg Chest Portable 1 View  Result Date: 08/27/2018 CLINICAL DATA:  Shortness of breath, abdominal swelling EXAM: PORTABLE CHEST 1 VIEW COMPARISON:  Portable exam 1733 hours compared to 07/13/2018 FINDINGS: Left subclavian sequential transvenous pacemaker leads project at right atrium and right ventricle. Minimal enlargement of cardiac silhouette. Mediastinal contours and pulmonary vascularity normal.  Atherosclerotic calcification aorta. Bronchitic changes with subsegmental atelectasis LEFT base. Lungs otherwise clear. No pleural effusion or pneumothorax. Bones demineralized. IMPRESSION: Bronchitic changes with subsegmental atelectasis at LEFT base. Electronically Signed   By: Lavonia Dana M.D.   On: 08/27/2018 17:56   Dg Abd Portable 1v  Result Date: 08/28/2018 CLINICAL DATA:  Abdominal distension, history CHF, hypertension, kidney stones EXAM: PORTABLE ABDOMEN - 1 VIEW COMPARISON:  CT abdomen pelvis 08/27/2018 FINDINGS: Excreted contrast material within bladder, which demonstrates trabeculation. Gaseous distention of small bowel loops. Prominent stool RIGHT colon. Scattered gas throughout remainder of colon. Degree of small bowel distension could either represent small bowel ileus or a component of small bowel obstruction, despite gas and stool in colon. No definite bowel wall thickening. Bones demineralized with degenerative disc/facet disease changes of the lumbar spine and old appearing L2 compression fracture. IMPRESSION: Gaseous distention of small bowel loops though prominent stool is present in the RIGHT colon and gas is seen throughout the remainder of the colon, either representing small-bowel ileus or obstruction. Trabeculated bladder. Osseous demineralization with L2 compression fracture Electronically Signed   By: Lavonia Dana M.D.   On: 08/28/2018 10:06        Scheduled Meds: . amiodarone  200 mg Oral Daily  .  carvedilol  6.25 mg Oral BID WC  . diltiazem  240 mg Oral Daily  . furosemide  40 mg Oral Daily  . insulin aspart  0-5 Units Subcutaneous QHS  . insulin aspart  0-9 Units Subcutaneous TID WC  . mouth rinse  15 mL Mouth Rinse BID  . [START ON 08/29/2018] rosuvastatin  2.5 mg Oral Q M,W,F  . sertraline  50 mg Oral Daily  . sodium phosphate  1 enema Rectal Once   Continuous Infusions:   LOS: 1 day    Time spent: 35 minutes spent on chart review, personally reviewed all  imaging studies/labs, discussion with nursing staff, consultants, updating family and interview/physical exam; more than 50% of that time was spent in counseling and/or coordination of care.    Eric J [place] DO [facility] Pager [phone]  If 7PM-7AM, please contact night-coverage [email or web address] Password [email or web address] 08/28/2018, 12:30 PM

## 2018-08-28 NOTE — Progress Notes (Signed)
Subjective: CC: Abdominal distension Patient is very hard of hearing. She reports no abdominal pain, N/V. Had 4 bm's recorded after enema around midnight. No flatus. Currently feels distended. Has not mobilized.   Objective: Vital signs in last 24 hours: Temp:  [97.9 F (36.6 C)-98.5 F (36.9 C)] 98.1 F (36.7 C) (08/04 0802) Pulse Rate:  [63-70] 63 (08/04 0802) Resp:  [16-35] 18 (08/04 0802) BP: (128-171)/(54-107) 130/54 (08/04 0802) SpO2:  [94 %-98 %] 96 % (08/04 0802) Weight:  [54 kg-54.8 kg] 54.8 kg (08/04 0348) Last BM Date: 08/26/18  Intake/Output from previous day: No intake/output data recorded. Intake/Output this shift: No intake/output data recorded.  PE: Gen: Frail elderly female lying in bed in NAD Heart: regular  Lungs: CTA b/l, normal rate and effort Abd: Distended but soft, NT, high pitched normoactive bowel sounds.  Msk: no edema   Lab Results:  Recent Labs    08/27/18 1807 08/28/18 0025  WBC 11.8* 12.4*  HGB 12.6 12.3  HCT 38.3 36.6  PLT 219 222   BMET Recent Labs    08/27/18 1807 08/28/18 0025  NA 134* 135  K 4.3 4.1  CL 99 98  CO2 26 26  GLUCOSE 171* 160*  BUN 13 12  CREATININE 0.73 0.70  CALCIUM 9.4 9.4   PT/INR Recent Labs    08/27/18 1807  LABPROT 17.2*  INR 1.4*   CMP     Component Value Date/Time   NA 135 08/28/2018 0025   NA 142 07/06/2015   K 4.1 08/28/2018 0025   CL 98 08/28/2018 0025   CO2 26 08/28/2018 0025   GLUCOSE 160 (H) 08/28/2018 0025   BUN 12 08/28/2018 0025   BUN 21 07/06/2015   CREATININE 0.70 08/28/2018 0025   CALCIUM 9.4 08/28/2018 0025   PROT 6.3 (L) 08/27/2018 1807   ALBUMIN 3.6 08/27/2018 1807   AST 19 08/27/2018 1807   ALT 17 08/27/2018 1807   ALKPHOS 75 08/27/2018 1807   BILITOT 0.8 08/27/2018 1807   GFRNONAA >60 08/28/2018 0025   GFRAA >60 08/28/2018 0025   Lipase     Component Value Date/Time   LIPASE 27 08/27/2018 1807       Studies/Results: US Abdomen Complete   Result Date: 08/27/2018 CLINICAL DATA:  Abdominal swelling EXAM: ABDOMEN ULTRASOUND COMPLETE COMPARISON:  CT abdomen pelvis July 13, 2018 FINDINGS: Technically difficult exam due to extensive bowel gas which obscures portions of the abdomen is noted low. Gallbladder: Multiple echogenic, post of the shadowing gallstones are present. Largest measures up to 1.9 cm in size. No wall thickening visualized. No sonographic Murphy sign noted by sonographer. Common bile duct: Diameter: 5.5 mm Liver: Portions of the right lobe liver obscured by bowel gas. An enhancing lesion along the false form ligament is not well visualized on this study. Within normal limits in parenchymal echogenicity. Portal vein is patent on color Doppler imaging with normal direction of blood flow towards the liver. IVC: Not well visualized due to bowel gas. Pancreas: Not visualized due to bowel gas. Spleen: Size (4.4 cm) and appearance within normal limits. Right Kidney: Length: 9.7. Echogenicity within normal limits. No mass or hydronephrosis visualized. Left Kidney: Length: 8.3 cm. Echogenicity within normal limits. No mass or hydronephrosis visualized. Lobular contours [place] reflect fetal lobulation versus scarring. Abdominal aorta: No aneurysm visualized. Other findings: Trace right pleural effusion. Right adrenal mass measuring approximately 5.8 x 3.6 x 4.9 cm. IMPRESSION: Technically difficult exam due to bowel gas. Right  adrenal mass measuring up to 5.8 cm in size, similar dimensions 2 most recent CT. A previously identified left adrenal mass and enhancing lesion along the falciform ligament are not well visualized. Cholelithiasis without evidence of acute cholecystitis. Lobular appearance of left kidney [place] reflect fetal lobulation versus scarring. Electronically Signed   By: Lovena Le M.D.   On: 08/27/2018 18:45   Ct Abdomen Pelvis W Contrast  Result Date: 08/27/2018 CLINICAL DATA:  Abdominal distension EXAM: CT ABDOMEN AND PELVIS WITH  CONTRAST TECHNIQUE: Multidetector CT imaging of the abdomen and pelvis was performed using the standard protocol following bolus administration of intravenous contrast. CONTRAST:  163mL OMNIPAQUE IOHEXOL 300 MG/ML  SOLN COMPARISON:  July 13, 2018 FINDINGS: Lower chest: Small left pleural effusion is identified. Mild atelectasis of posterior left lung base is noted. The heart size is enlarged. Hepatobiliary: There is stable intra and extrahepatic biliary ductal dilatation. The gallbladder is stable. There is a 2.4 cm enhancing lesion near the falciform ligament unchanged compared prior exam. No other focal liver lesion is identified. Pancreas: Unremarkable. No pancreatic ductal dilatation or surrounding inflammatory changes. Spleen: Normal in size without focal abnormality. Adrenals/Urinary Tract: Stable 5.5 cm fatty right adrenal lesion is identified consistent with myelolipoma. This is unchanged. 3.1 cm left adrenal mass is unchanged compared prior exam. There is atrophy of the left kidney. There is no hydronephrosis bilaterally. The bladder is partially decompressed without gross abnormality. Stomach/Bowel: There are multiple dilated small bowel loops throughout the abdomen and pelvis without clear transition. The left colon and sigmoid colon are decompressed. There is stool and air identified in the right colon and rectal sigmoid colon. The appendix not definitely seen but no inflammation is noted around the cecum. There is a small hiatal hernia. Vascular/Lymphatic: Aortic atherosclerosis. No enlarged abdominal or pelvic lymph nodes. Reproductive: Uterus and bilateral adnexa are unremarkable. Other: None. Musculoskeletal: There are degenerative joint changes the spine. There is further loss of height of compression fracture at L2 compared to prior exam of July 13, 2018, superimposed acute fracture is not excluded. IMPRESSION: Multiple dilated small bowel loops in the abdomen and pelvis. There is air and stool  identified in the colon. The findings [place] be due to early or partial small bowel obstruction. Follow-up is recommended. Further loss of vertebral body height with compression fracture at L2 compared to prior exam of July 13, 2018, superimposed acute fracture is not excluded. Electronically Signed   By: Abelardo Diesel M.D.   On: 08/27/2018 20:14   Dg Chest Portable 1 View  Result Date: 08/27/2018 CLINICAL DATA:  Shortness of breath, abdominal swelling EXAM: PORTABLE CHEST 1 VIEW COMPARISON:  Portable exam 1733 hours compared to 07/13/2018 FINDINGS: Left subclavian sequential transvenous pacemaker leads project at right atrium and right ventricle. Minimal enlargement of cardiac silhouette. Mediastinal contours and pulmonary vascularity normal. Atherosclerotic calcification aorta. Bronchitic changes with subsegmental atelectasis LEFT base. Lungs otherwise clear. No pleural effusion or pneumothorax. Bones demineralized. IMPRESSION: Bronchitic changes with subsegmental atelectasis at LEFT base. Electronically Signed   By: Lavonia Dana M.D.   On: 08/27/2018 17:56    Anti-infectives: Anti-infectives (From admission, onward)   None       Assessment/Plan HTN DM Afib on Eliquis at home - on hold  Aortic insuff dCHF Sick sinus syndrome   Possible pSBO vs constipation causing ileus -Suspect likely underlying constipation given lack of any clear transition point, no n/v, and stool burden noted on CT but could also have some degree of pSBO  given surgical hx (appendectomy) - Keep NPO - repeat xray this AM -Would also hold off on NG tube at this time unless she were to develop significant nausea or emesis - Keep Mg> 2 and K >4 for bowel function - Mobilize for bowel function - Minimize narcotics to avoid worsening constipation   FEN: NPO. K 4.1 VTE: SCDs ID: None POC - will attempt to call again today.    LOS: 1 day    Jillyn Ledger , [facility] Surgery 08/28/2018, 8:46 AM Pager:  [phone]

## 2018-08-28 NOTE — Plan of Care (Signed)

## 2018-08-28 NOTE — Progress Notes (Addendum)
Enema complete with 1 medium BM resulted.

## 2018-08-29 ENCOUNTER — Inpatient Hospital Stay (HOSPITAL_COMMUNITY): Payer: Medicare Other

## 2018-08-29 LAB — BASIC METABOLIC PANEL
Anion gap: 17 — ABNORMAL HIGH (ref 5–15)
BUN: 11 mg/dL (ref 8–23)
CO2: 25 mmol/L (ref 22–32)
Calcium: 8.7 mg/dL — ABNORMAL LOW (ref 8.9–10.3)
Chloride: 97 mmol/L — ABNORMAL LOW (ref 98–111)
Creatinine, Ser: 0.92 mg/dL (ref 0.44–1.00)
GFR calc Af Amer: 60 mL/min — ABNORMAL LOW (ref >60)
GFR calc non Af Amer: 51 mL/min — ABNORMAL LOW (ref >60)
Glucose, Bld: 132 mg/dL — ABNORMAL HIGH (ref 70–99)
Potassium: 3.1 mmol/L — ABNORMAL LOW (ref 3.5–5.1)
Sodium: 139 mmol/L (ref 135–145)

## 2018-08-29 LAB — GLUCOSE, CAPILLARY
Glucose-Capillary: 118 mg/dL — ABNORMAL HIGH (ref 70–99)
Glucose-Capillary: 183 mg/dL — ABNORMAL HIGH (ref 70–99)
Glucose-Capillary: 134 mg/dL — ABNORMAL HIGH (ref 70–99)
Glucose-Capillary: 110 mg/dL — ABNORMAL HIGH (ref 70–99)

## 2018-08-29 LAB — MAGNESIUM: Magnesium: 1.8 mg/dL (ref 1.7–2.4)

## 2018-08-29 MED ORDER — SODIUM CHLORIDE 0.9 % IV SOLN: INTRAVENOUS | Status: DC | PRN

## 2018-08-29 MED ORDER — DEXTROSE-NACL 5-0.45 % IV SOLN
INTRAVENOUS | Status: DC
  Administered 2018-08-29 – 2018-08-30 (×3): via INTRAVENOUS

## 2018-08-29 MED ORDER — POTASSIUM CHLORIDE 10 MEQ/100ML IV SOLN
10.0000 meq | INTRAVENOUS | Status: AC
  Administered 2018-08-29 (×2): 10 meq via INTRAVENOUS
  Filled 2018-08-29 (×2): qty 100

## 2018-08-29 MED ORDER — POTASSIUM CHLORIDE 10 MEQ/100ML IV SOLN
10.0000 meq | INTRAVENOUS | Status: AC
  Administered 2018-08-29 (×4): 10 meq via INTRAVENOUS
  Filled 2018-08-29 (×4): qty 100

## 2018-08-29 MED ORDER — SODIUM CHLORIDE 0.9 % IV SOLN
250.0000 mg | Freq: Four times a day (QID) | INTRAVENOUS | Status: DC
  Administered 2018-08-29 – 2018-08-30 (×3): 250 mg via INTRAVENOUS
  Filled 2018-08-29 (×7): qty 5

## 2018-08-29 MED ORDER — POTASSIUM CHLORIDE 10 MEQ/100ML IV SOLN
INTRAVENOUS | Status: AC
  Filled 2018-08-29: qty 100

## 2018-08-29 MED ORDER — DIATRIZOATE MEGLUMINE & SODIUM 66-10 % PO SOLN
90.0000 mL | Freq: Once | ORAL | Status: AC
  Administered 2018-08-29: 90 mL via ORAL
  Filled 2018-08-29: qty 90

## 2018-08-29 MED ORDER — HEPARIN SODIUM (PORCINE) 5000 UNIT/ML IJ SOLN
5000.0000 [IU] | Freq: Three times a day (TID) | INTRAMUSCULAR | Status: DC
  Administered 2018-08-29 – 2018-08-31 (×6): 5000 [IU] via SUBCUTANEOUS
  Filled 2018-08-29 (×6): qty 1

## 2018-08-29 MED ORDER — SODIUM CHLORIDE 0.9 % IV SOLN
INTRAVENOUS | Status: DC | PRN
  Administered 2018-08-29 – 2018-08-30 (×4): 250 mL via INTRAVENOUS

## 2018-08-29 NOTE — Evaluation (Signed)
Physical Therapy Evaluation Patient Details Name: Jo [name] MRN: [id number] DOB: [birth year] Today's Date: 08/29/2018   History of Present Illness  Jo [name] is a 85 y.o. female with medical history significant of hypertension, hyperlipidemia, diabetes mellitus, SSS, s/p of pacemaker placement, PAF on Eliquis, dCHF, anxiety, HOH, partial small bowel obstruction, who presents with abdominal distention and shortness of breath.  Clinical Impression  Pt admitted with above diagnosis. Pt currently with functional limitations due to the deficits listed below (see PT Problem List). Pt will benefit from skilled PT to increase their independence and safety with mobility to allow discharge to the venue listed below.  Spoke to son on the phone while in pt's room and they are unable to continue caring for pt as they have the last 2 months.  Recommend SNF. Son reports she has been to [facility] in the past and Anaya also be interested in [facility] [place] [facility].     Follow Up Recommendations SNF;Supervision/Assistance - 24 hour    Equipment Recommendations  None recommended by PT    Recommendations for Other Services       Precautions / Restrictions Precautions Precautions: Fall Restrictions Weight Bearing Restrictions: No      Mobility  Bed Mobility Overal bed mobility: Needs Assistance Bed Mobility: Supine to Sit     Supine to sit: Min assist     General bed mobility comments: MIN A to get trunk upright and used bed pad to assist to get to EOB. Pt on RA for transfer per nursing request and down to 88%.  o2 re-applied at 2 L/min  Transfers Overall transfer level: Needs assistance Equipment used: 1 person hand held assist Transfers: Stand Pivot Transfers Sit to Stand: Min assist Stand pivot transfers: Mod assist       General transfer comment: Able to stand with MIN, but needed MOD A for pivotal steps to recliner.  Ambulation/Gait             General Gait Details: unable due to  fatigue  Stairs            Wheelchair Mobility    Modified Rankin (Stroke Patients Only)       Balance Overall balance assessment: Needs assistance;History of Falls Sitting-balance support: Feet supported Sitting balance-Leahy Scale: Fair       Standing balance-Leahy Scale: Poor                               Pertinent Vitals/Pain Pain Assessment: Faces Faces Pain Scale: Hurts a little bit Pain Location: R hip Pain Descriptors / Indicators: Grimacing;Sore Pain Intervention(s): Limited activity within patient's tolerance;Monitored during session;Repositioned    Home Living Family/patient expects to be discharged to:: Skilled nursing facility                 Additional Comments: Has had family staying with her 24/7 since hospital admission in June and family reports they can't keep it up.    Prior Function Level of Independence: Needs assistance   Gait / Transfers Assistance Needed: Prior to June, lived alone and amb short distances with rollator. Since then family has had to be with her and very limited walking.           Hand Dominance        Extremity/Trunk Assessment   Upper Extremity Assessment Upper Extremity Assessment: Generalized weakness    Lower Extremity Assessment Lower Extremity Assessment: Generalized weakness  Cervical / Trunk Assessment Cervical / Trunk Assessment: Kyphotic  Communication   Communication: HOH  Cognition Arousal/Alertness: Awake/alert Behavior During Therapy: WFL for tasks assessed/performed Overall Cognitive Status: Within Functional Limits for tasks assessed                                        General Comments      Exercises     Assessment/Plan    PT Assessment Patient needs continued PT services  PT Problem List Decreased strength;Decreased activity tolerance;Decreased mobility;Cardiopulmonary status limiting activity       PT Treatment Interventions DME  instruction;Gait training;Functional mobility training;Neuromuscular re-education;Balance training;Therapeutic exercise;Therapeutic activities;Patient/family education    PT Goals (Current goals can be found in the Care Plan section)  Acute Rehab PT Goals Patient Stated Goal: Son in interested in rehab PT Goal Formulation: With family Time For Goal Achievement: 09/12/18 Potential to Achieve Goals: Good    Frequency Min 2X/week   Barriers to discharge Decreased caregiver support      Co-evaluation               AM-PAC PT "6 Clicks" Mobility  Outcome Measure Help needed turning from your back to your side while in a flat bed without using bedrails?: A Lot Help needed moving from lying on your back to sitting on the side of a flat bed without using bedrails?: A Lot Help needed moving to and from a bed to a chair (including a wheelchair)?: A Lot Help needed standing up from a chair using your arms (e.g., wheelchair or bedside chair)?: A Little Help needed to walk in hospital room?: A Lot Help needed climbing 3-5 steps with a railing? : Total 6 Click Score: 12    End of Session Equipment Utilized During Treatment: Oxygen Activity Tolerance: Patient limited by fatigue Patient left: in chair;with chair alarm set;with nursing/sitter in room Nurse Communication: Mobility status      Time: 0922-0953 PT Time Calculation (min) (ACUTE ONLY): 31 min   Charges:   PT Evaluation $PT Eval Moderate Complexity: 1 Mod PT Treatments $Therapeutic Activity: 8-22 mins        Deston Bilyeu L. Tamala Julian, [place] Pager [phone] 08/29/2018   Galen Manila 08/29/2018, 10:32 AM

## 2018-08-29 NOTE — Plan of Care (Signed)

## 2018-08-29 NOTE — Progress Notes (Signed)
Teli dc per MD order. CCMD notified

## 2018-08-29 NOTE — Progress Notes (Signed)
Patient weaned to RA yesterday 8/4 during the day with stable oxygen. However patient dropped down to 80's during the night per night Shift RN and nasal cannula was applied again at 2 l/min

## 2018-08-29 NOTE — Progress Notes (Signed)
Not able to went patient to RA due to de-saturation. O2 87% on RA. MD notified.

## 2018-08-29 NOTE — Progress Notes (Signed)
Subjective: CC: Abdominal tightness Patient reports that her abdomen feels tight but denies any abdominal pain. Had another enema yesterday. 4 bm's recorded yesterday. She is very hard of hearing and cannot tell me if she has passed flatus. PT ordered to help her mobilize. She reports left shoulder blade pain and pain with deep breathing. Currently on o2. No chest pain.   Objective: Vital signs in last 24 hours: Temp:  [97.5 F (36.4 C)-98.5 F (36.9 C)] 97.7 F (36.5 C) (08/05 0729) Pulse Rate:  [61-83] 64 (08/05 0729) Resp:  [16-20] 16 (08/05 0729) BP: (102-153)/(53-66) 143/62 (08/05 0729) SpO2:  [84 %-99 %] 98 % (08/05 0729) Weight:  [53.5 kg] 53.5 kg (08/05 0346) Last BM Date: 08/28/18  Intake/Output from previous day: 08/04 0701 - 08/05 0700 In: 300 [P.O.:300] Out: 1150 [Urine:1150] Intake/Output this shift: No intake/output data recorded.  PE: Gen: Frail elderly female lying in bed in NAD Heart: regular  Lungs: CTA b/l, normal rate and effort Abd: Less distended but still with moderate distension and soft, NT, normoactive bowel sounds.  Msk: no edema   Lab Results:  Recent Labs    08/27/18 1807 08/28/18 0025  WBC 11.8* 12.4*  HGB 12.6 12.3  HCT 38.3 36.6  PLT 219 222   BMET Recent Labs    08/28/18 0025 08/29/18 0640  NA 135 139  K 4.1 3.1*  CL 98 97*  CO2 26 25  GLUCOSE 160* 132*  BUN 12 11  CREATININE 0.70 0.92  CALCIUM 9.4 8.7*   PT/INR Recent Labs    08/27/18 1807  LABPROT 17.2*  INR 1.4*   CMP     Component Value Date/Time   NA 139 08/29/2018 0640   NA 142 07/06/2015   K 3.1 (L) 08/29/2018 0640   CL 97 (L) 08/29/2018 0640   CO2 25 08/29/2018 0640   GLUCOSE 132 (H) 08/29/2018 0640   BUN 11 08/29/2018 0640   BUN 21 07/06/2015   CREATININE 0.92 08/29/2018 0640   CALCIUM 8.7 (L) 08/29/2018 0640   PROT 6.3 (L) 08/27/2018 1807   ALBUMIN 3.6 08/27/2018 1807   AST 19 08/27/2018 1807   ALT 17 08/27/2018 1807   ALKPHOS 75  08/27/2018 1807   BILITOT 0.8 08/27/2018 1807   GFRNONAA 51 (L) 08/29/2018 0640   GFRAA 60 (L) 08/29/2018 0640   Lipase     Component Value Date/Time   LIPASE 27 08/27/2018 1807       Studies/Results: US Abdomen Complete  Result Date: 08/27/2018 CLINICAL DATA:  Abdominal swelling EXAM: ABDOMEN ULTRASOUND COMPLETE COMPARISON:  CT abdomen pelvis July 13, 2018 FINDINGS: Technically difficult exam due to extensive bowel gas which obscures portions of the abdomen is noted low. Gallbladder: Multiple echogenic, post of the shadowing gallstones are present. Largest measures up to 1.9 cm in size. No wall thickening visualized. No sonographic Murphy sign noted by sonographer. Common bile duct: Diameter: 5.5 mm Liver: Portions of the right lobe liver obscured by bowel gas. An enhancing lesion along the false form ligament is not well visualized on this study. Within normal limits in parenchymal echogenicity. Portal vein is patent on color Doppler imaging with normal direction of blood flow towards the liver. IVC: Not well visualized due to bowel gas. Pancreas: Not visualized due to bowel gas. Spleen: Size (4.4 cm) and appearance within normal limits. Right Kidney: Length: 9.7. Echogenicity within normal limits. No mass or hydronephrosis visualized. Left Kidney: Length: 8.3 cm. Echogenicity within normal  limits. No mass or hydronephrosis visualized. Lobular contours [place] reflect fetal lobulation versus scarring. Abdominal aorta: No aneurysm visualized. Other findings: Trace right pleural effusion. Right adrenal mass measuring approximately 5.8 x 3.6 x 4.9 cm. IMPRESSION: Technically difficult exam due to bowel gas. Right adrenal mass measuring up to 5.8 cm in size, similar dimensions 2 most recent CT. A previously identified left adrenal mass and enhancing lesion along the falciform ligament are not well visualized. Cholelithiasis without evidence of acute cholecystitis. Lobular appearance of left kidney Horace  reflect fetal lobulation versus scarring. Electronically Signed   By: Lovena Le M.D.   On: 08/27/2018 18:45   Ct Abdomen Pelvis W Contrast  Result Date: 08/27/2018 CLINICAL DATA:  Abdominal distension EXAM: CT ABDOMEN AND PELVIS WITH CONTRAST TECHNIQUE: Multidetector CT imaging of the abdomen and pelvis was performed using the standard protocol following bolus administration of intravenous contrast. CONTRAST:  16mL OMNIPAQUE IOHEXOL 300 MG/ML  SOLN COMPARISON:  July 13, 2018 FINDINGS: Lower chest: Small left pleural effusion is identified. Mild atelectasis of posterior left lung base is noted. The heart size is enlarged. Hepatobiliary: There is stable intra and extrahepatic biliary ductal dilatation. The gallbladder is stable. There is a 2.4 cm enhancing lesion near the falciform ligament unchanged compared prior exam. No other focal liver lesion is identified. Pancreas: Unremarkable. No pancreatic ductal dilatation or surrounding inflammatory changes. Spleen: Normal in size without focal abnormality. Adrenals/Urinary Tract: Stable 5.5 cm fatty right adrenal lesion is identified consistent with myelolipoma. This is unchanged. 3.1 cm left adrenal mass is unchanged compared prior exam. There is atrophy of the left kidney. There is no hydronephrosis bilaterally. The bladder is partially decompressed without gross abnormality. Stomach/Bowel: There are multiple dilated small bowel loops throughout the abdomen and pelvis without clear transition. The left colon and sigmoid colon are decompressed. There is stool and air identified in the right colon and rectal sigmoid colon. The appendix not definitely seen but no inflammation is noted around the cecum. There is a small hiatal hernia. Vascular/Lymphatic: Aortic atherosclerosis. No enlarged abdominal or pelvic lymph nodes. Reproductive: Uterus and bilateral adnexa are unremarkable. Other: None. Musculoskeletal: There are degenerative joint changes the spine. There is  further loss of height of compression fracture at L2 compared to prior exam of July 13, 2018, superimposed acute fracture is not excluded. IMPRESSION: Multiple dilated small bowel loops in the abdomen and pelvis. There is air and stool identified in the colon. The findings Pileggi be due to early or partial small bowel obstruction. Follow-up is recommended. Further loss of vertebral body height with compression fracture at L2 compared to prior exam of July 13, 2018, superimposed acute fracture is not excluded. Electronically Signed   By: Abelardo Diesel M.D.   On: 08/27/2018 20:14   Dg Chest Portable 1 View  Result Date: 08/27/2018 CLINICAL DATA:  Shortness of breath, abdominal swelling EXAM: PORTABLE CHEST 1 VIEW COMPARISON:  Portable exam 1733 hours compared to 07/13/2018 FINDINGS: Left subclavian sequential transvenous pacemaker leads project at right atrium and right ventricle. Minimal enlargement of cardiac silhouette. Mediastinal contours and pulmonary vascularity normal. Atherosclerotic calcification aorta. Bronchitic changes with subsegmental atelectasis LEFT base. Lungs otherwise clear. No pleural effusion or pneumothorax. Bones demineralized. IMPRESSION: Bronchitic changes with subsegmental atelectasis at LEFT base. Electronically Signed   By: Lavonia Dana M.D.   On: 08/27/2018 17:56   Dg Abd Portable 1v  Result Date: 08/29/2018 CLINICAL DATA:  Abdominal distention. EXAM: PORTABLE ABDOMEN - 1 VIEW COMPARISON:  Abdomen 08/28/2018.  CT 08/27/2018. FINDINGS: Persistent dilated loops of small bowel are again noted without interim change. Continued follow-up exam to demonstrate resolution suggested. Scratch stool noted throughout the colon. No free air. Stable degenerative changes and compression fractures lumbar spine. Degenerative changes both hips. Cardiac pacer wires noted over the heart. IMPRESSION: Persistent dilated loops of small bowel are again noted without interim change. Again small-bowel obstruction  cannot be excluded. Continued follow-up exams to demonstrate resolution suggested. Stool again noted throughout the colon. No free air. Electronically Signed   By: Marcello Moores  Register   On: 08/29/2018 08:36   Dg Abd Portable 1v  Result Date: 08/28/2018 CLINICAL DATA:  Abdominal distension, history CHF, hypertension, kidney stones EXAM: PORTABLE ABDOMEN - 1 VIEW COMPARISON:  CT abdomen pelvis 08/27/2018 FINDINGS: Excreted contrast material within bladder, which demonstrates trabeculation. Gaseous distention of small bowel loops. Prominent stool RIGHT colon. Scattered gas throughout remainder of colon. Degree of small bowel distension could either represent small bowel ileus or a component of small bowel obstruction, despite gas and stool in colon. No definite bowel wall thickening. Bones demineralized with degenerative disc/facet disease changes of the lumbar spine and old appearing L2 compression fracture. IMPRESSION: Gaseous distention of small bowel loops though prominent stool is present in the RIGHT colon and gas is seen throughout the remainder of the colon, either representing small-bowel ileus or obstruction. Trabeculated bladder. Osseous demineralization with L2 compression fracture Electronically Signed   By: Lavonia Dana M.D.   On: 08/28/2018 10:06    Anti-infectives: Anti-infectives (From admission, onward)   None       Assessment/Plan HTN DM Afib on Eliquis at home - on hold  Aortic insuff dCHF Sick sinus syndrome SOB - per medicine   Possible pSBO vs constipation causing ileus -Suspect likely underlying constipation given lack of any clear transition point, no n/v, and stool burden noted on CTbut could also have some degree of pSBO given surgical hx (appendectomy) - Repeat xray this AM with continued dilated small bowel and stool burden  - Hold off on NG tube at this timeunless she were to develop significant nausea or emesis - Will do SBO protocol orally. Gastrografin likely  will benefit from a laxative standpoint as well - Keep Mg> 2 and K >4 for bowel function - Mobilize for bowel function. PT ordered by primary team - Minimize narcotics to avoid worsening constipation   FEN: NPO. K 3.1 VTE: SCDs ID: None POC - Chrystie Nose (Daughter) [phone] (home), [phone] (cell). I updated her on the phone this morning.    LOS: 2 days    Jillyn Ledger , [facility] Surgery 08/29/2018, 8:47 AM Pager: [phone]

## 2018-08-29 NOTE — Progress Notes (Signed)
6 IV runs of potassium completed

## 2018-08-29 NOTE — Progress Notes (Signed)
PROGRESS NOTE    Jo [name]  FXJ:[id number] DOB: December 02, 1917 DOA: 08/27/2018 PCP: Crist Infante, MD    Brief Narrative:   Jo [name] is a 85 y.o. female with medical history significant of hypertension, hyperlipidemia, diabetes mellitus, SSS, s/p of pacemaker placement, PAF on Eliquis, dCHF, anxiety, HOH, partial small bowel obstruction, who presents with abdominal distention and shortness of breath.  Per pt's daughter (I called her daughter by phone), patient has been having abdominal swelling and abdominal distention for several days.  Patient does not seem to seem to have abdominal pain or active vomiting per her daughter.  Last bowel movement was 2 days ago.  Her daughter states that they treated her with laxatives including MiraLAX and pt had some bowel movement. Pt also has shortness of breath, but no cough, fever, chills, chest pain.  Daughter states that patient has gained approximately 3 pounds last week.  Patient was recently hospitalized from 5/30-6/3 due to CHF exacerbation.  After she went home, she did not need oxygen, but today patient had oxygen desaturation to 88% on room air, and required 2 L nasal cannula oxygen at home per her daughter.  Patient has a generalized weakness, but no unilateral tenderness or numbness.  No facial droop or slurred speech no symptoms of UTI.  Patient seems to have right hip pain per her daughter.  ED Course: pt was found to have BNP 228, lipase 27, LFT normal, creatinine BUN normal, WBC 11.8, temperature normal, blood pressure 171/107, heart rate 70, oxygen saturation 94%.  Chest x-ray showed a bronchitic change with possible left base atelectasis. Pt is admitted to tele bed as inpt.  Assessment & Plan:   Principal Problem:   Partial small bowel obstruction (HCC) Active Problems:   PAF (paroxysmal atrial fibrillation) (HCC)   Hypertension   Diabetes mellitus without complication (HCC)   Anxiety   Dyslipidemia   Chronic diastolic CHF (congestive  heart failure) (HCC)   Possible partial small bowel obstruction vs constipation causing ileus: Patient presenting with progressive abdominal distention.  Thought to be initially from volume overload by family members.  CT abdomen/pelvis shows multiple dilated small bowel loops consistent with early versus partial small bowel obstruction. --General surgery following, appreciate assistance --Try to avoid invasive procedures such as NG tube or surgical intervention --s/p fleets enema yesterday with multiple bowel movements --Repeat KUB with persistent small bowel dilation and stool burden --Continue n.p.o. status --mIVF w/ D5 1/2 NS at 68mL/hr --Surgery plans SBO protocol today --PT to assist with mobilization --Further per general surgery  Hypokalemia: Potassium 3.1 this morning, will replete. --Repeat electrolytes to include magnesium in the a.m.  PAF (paroxysmal atrial fibrillation) CHA2DS2-VASc Score is 6. Patient is on Eliquis at home. Heart rate is well controlled. --holding Eliquis in case pt needs surgery  --continue amiodarone, Coreg, Cardizem  Essential hypertension: --Continue Coreg and Cardizem --IV hydralazine as needed  Diabetes mellitus without complication: Last M4B 7.6.. Patient is taking Januvia at home --Hold home medication regimen --Insulin sliding scale for coverage while inpatient  Anxiety: --Continue home ativan prn  Dyslipidemia: --Crestor  Chronic diastolic CHF (congestive heart failure) 2D echo on 06/05/2017 showed EF 65-70%.  BNP slightly elevated 228.  Patient has worsening shortness of breath.  She Sautter have a mild CHF exacerbation. --will continue oral lasix 40 mg daily --Daily weights, strict I's and O's  Adrenal mass:  US showed a right adrenal mass measuring up to 5.8 cm in size, similar dimensions 2 most recent  CT. A previously identified left adrenal mass and enhancing lesion along the falciform ligament are not well visualized.  --Recommend follow-up outpatient with PCP, given her advanced age, likely not optimal surgical candidate   DVT prophylaxis: SCDs, heparin (home eliquis on hold for possible surgical intervention) Code Status: DNR Family Communication: none Disposition Plan: Continue inpatient hospitalization, further dependent on clinical course, pending sign off from general surgery, PT recommends SNF, social work for coordination   Consultants:   General surgery  Procedures:   None  Antimicrobials:   None   Subjective: Patient seen and examined at bedside, resting comfortably.  Complains of right scapular pain following chest x-ray this morning.  Reports bowel movements yesterday although continues with significant abdominal distention/pain.  Evaluated by PT this morning with recommendations of SNF placement.  No other specific complaints or concerns at this time.  Denies headache, no fever/chills/night sweats, no nausea/vomiting/diarrhea, no chest pain, no palpitations, no bowel pain, no weakness, no issues with bladder function, no paresthesias.  No acute events overnight per nursing staff.  Objective: Vitals:   08/29/18 0936 08/29/18 1007 08/29/18 1008 08/29/18 1025  BP: (!) 130/49     Pulse: 62     Resp: 20     Temp:      TempSrc:      SpO2: 95% 95% (!) 87% 94%  Weight:      Height:        Intake/Output Summary (Last 24 hours) at 08/29/2018 1104 Last data filed at 08/29/2018 1001 Gross per 24 hour  Intake 270 ml  Output 1450 ml  Net -1180 ml   Filed Weights   08/27/18 2218 08/28/18 0348 08/29/18 0346  Weight: 54 kg 54.8 kg 53.5 kg    Examination:  General exam: Appears calm and comfortable  Respiratory system: Clear to auscultation. Respiratory effort normal.  On 3 L nasal cannula. Cardiovascular system: S1 & S2 heard, RRR. No JVD, murmurs, rubs, gallops or clicks. No pedal edema. Gastrointestinal system: Abdomen is distended, soft and mild generalized tenderness. No  organomegaly or masses felt.  Faint bowel sounds present. Central nervous system: Alert and oriented. No focal neurological deficits. Extremities: Symmetric 5 x 5 power. Skin: No rashes, lesions or ulcers Psychiatry: Judgement and insight appear normal. Mood & affect appropriate.     Data Reviewed: I have personally reviewed following labs and imaging studies  CBC: Recent Labs  Lab 08/27/18 1807 08/28/18 0025  WBC 11.8* 12.4*  HGB 12.6 12.3  HCT 38.3 36.6  MCV 100.5* 97.9  PLT 219 751   Basic Metabolic Panel: Recent Labs  Lab 08/27/18 1807 08/28/18 0025 08/29/18 0640  NA 134* 135 139  K 4.3 4.1 3.1*  CL 99 98 97*  CO2 26 26 25   GLUCOSE 171* 160* 132*  BUN 13 12 11   CREATININE 0.73 0.70 0.92  CALCIUM 9.4 9.4 8.7*  MG  --   --  1.8   GFR: Estimated Creatinine Clearance: 26.4 mL/min (by C-G formula based on SCr of 0.92 mg/dL). Liver Function Tests: Recent Labs  Lab 08/27/18 1807  AST 19  ALT 17  ALKPHOS 75  BILITOT 0.8  PROT 6.3*  ALBUMIN 3.6   Recent Labs  Lab 08/27/18 1807  LIPASE 27   No results for input(s): AMMONIA in the last 168 hours. Coagulation Profile: Recent Labs  Lab 08/27/18 1807  INR 1.4*   Cardiac Enzymes: No results for input(s): CKTOTAL, CKMB, CKMBINDEX, TROPONINI in the last 168 hours. BNP (last 3 results)  No results for input(s): PROBNP in the last 8760 hours. HbA1C: No results for input(s): HGBA1C in the last 72 hours. CBG: Recent Labs  Lab 08/28/18 0613 08/28/18 1207 08/28/18 1621 08/28/18 2151 08/29/18 0551  GLUCAP 126* 144* 113* 127* 118*   Lipid Profile: No results for input(s): CHOL, HDL, LDLCALC, TRIG, CHOLHDL, LDLDIRECT in the last 72 hours. Thyroid Function Tests: No results for input(s): TSH, T4TOTAL, FREET4, T3FREE, THYROIDAB in the last 72 hours. Anemia Panel: No results for input(s): VITAMINB12, FOLATE, FERRITIN, TIBC, IRON, RETICCTPCT in the last 72 hours. Sepsis Labs: No results for input(s):  PROCALCITON, LATICACIDVEN in the last 168 hours.  Recent Results (from the past 240 hour(s))  SARS CORONAVIRUS 2 Nasal Swab Aptima Multi Swab     Status: None   Collection Time: 08/27/18  8:51 PM   Specimen: Aptima Multi Swab; Nasal Swab  Result Value Ref Range Status   SARS Coronavirus 2 NEGATIVE NEGATIVE Final    Comment: (NOTE) SARS-CoV-2 target nucleic acids are NOT DETECTED. The SARS-CoV-2 RNA is generally detectable in upper and lower respiratory specimens during the acute phase of infection. Negative results do not preclude SARS-CoV-2 infection, do not rule out co-infections with other pathogens, and should not be used as the sole basis for treatment or other patient management decisions. Negative results must be combined with clinical observations, patient history, and epidemiological information. The expected result is Negative. Fact Sheet for Patients: SugarRoll.be Fact Sheet for Healthcare Providers: https://www.woods-mathews.com/ This test is not yet approved or cleared by the [place] FDA and  has been authorized for detection and/or diagnosis of SARS-CoV-2 by FDA under an Emergency Use Authorization (EUA). This EUA will remain  in effect (meaning this test can be used) for the duration of the COVID-19 declaration under Section 56 4(b)(1) of the Act, 21 U.S.C. section 360bbb-3(b)(1), unless the authorization is terminated or revoked sooner. Performed at [facility], [place] [address], [place], [place] [address]          Radiology Studies: US Abdomen Complete  Result Date: 08/27/2018 CLINICAL DATA:  Abdominal swelling EXAM: ABDOMEN ULTRASOUND COMPLETE COMPARISON:  CT abdomen pelvis July 13, 2018 FINDINGS: Technically difficult exam due to extensive bowel gas which obscures portions of the abdomen is noted low. Gallbladder: Multiple echogenic, post of the shadowing gallstones are present. Largest measures up to 1.9  cm in size. No wall thickening visualized. No sonographic Murphy sign noted by sonographer. Common bile duct: Diameter: 5.5 mm Liver: Portions of the right lobe liver obscured by bowel gas. An enhancing lesion along the false form ligament is not well visualized on this study. Within normal limits in parenchymal echogenicity. Portal vein is patent on color Doppler imaging with normal direction of blood flow towards the liver. IVC: Not well visualized due to bowel gas. Pancreas: Not visualized due to bowel gas. Spleen: Size (4.4 cm) and appearance within normal limits. Right Kidney: Length: 9.7. Echogenicity within normal limits. No mass or hydronephrosis visualized. Left Kidney: Length: 8.3 cm. Echogenicity within normal limits. No mass or hydronephrosis visualized. Lobular contours [place] reflect fetal lobulation versus scarring. Abdominal aorta: No aneurysm visualized. Other findings: Trace right pleural effusion. Right adrenal mass measuring approximately 5.8 x 3.6 x 4.9 cm. IMPRESSION: Technically difficult exam due to bowel gas. Right adrenal mass measuring up to 5.8 cm in size, similar dimensions 2 most recent CT. A previously identified left adrenal mass and enhancing lesion along the falciform ligament are not well visualized. Cholelithiasis without evidence  of acute cholecystitis. Lobular appearance of left kidney Friske reflect fetal lobulation versus scarring. Electronically Signed   By: Lovena Le M.D.   On: 08/27/2018 18:45   Ct Abdomen Pelvis W Contrast  Result Date: 08/27/2018 CLINICAL DATA:  Abdominal distension EXAM: CT ABDOMEN AND PELVIS WITH CONTRAST TECHNIQUE: Multidetector CT imaging of the abdomen and pelvis was performed using the standard protocol following bolus administration of intravenous contrast. CONTRAST:  122mL OMNIPAQUE IOHEXOL 300 MG/ML  SOLN COMPARISON:  July 13, 2018 FINDINGS: Lower chest: Small left pleural effusion is identified. Mild atelectasis of posterior left lung base is  noted. The heart size is enlarged. Hepatobiliary: There is stable intra and extrahepatic biliary ductal dilatation. The gallbladder is stable. There is a 2.4 cm enhancing lesion near the falciform ligament unchanged compared prior exam. No other focal liver lesion is identified. Pancreas: Unremarkable. No pancreatic ductal dilatation or surrounding inflammatory changes. Spleen: Normal in size without focal abnormality. Adrenals/Urinary Tract: Stable 5.5 cm fatty right adrenal lesion is identified consistent with myelolipoma. This is unchanged. 3.1 cm left adrenal mass is unchanged compared prior exam. There is atrophy of the left kidney. There is no hydronephrosis bilaterally. The bladder is partially decompressed without gross abnormality. Stomach/Bowel: There are multiple dilated small bowel loops throughout the abdomen and pelvis without clear transition. The left colon and sigmoid colon are decompressed. There is stool and air identified in the right colon and rectal sigmoid colon. The appendix not definitely seen but no inflammation is noted around the cecum. There is a small hiatal hernia. Vascular/Lymphatic: Aortic atherosclerosis. No enlarged abdominal or pelvic lymph nodes. Reproductive: Uterus and bilateral adnexa are unremarkable. Other: None. Musculoskeletal: There are degenerative joint changes the spine. There is further loss of height of compression fracture at L2 compared to prior exam of July 13, 2018, superimposed acute fracture is not excluded. IMPRESSION: Multiple dilated small bowel loops in the abdomen and pelvis. There is air and stool identified in the colon. The findings Mazor be due to early or partial small bowel obstruction. Follow-up is recommended. Further loss of vertebral body height with compression fracture at L2 compared to prior exam of July 13, 2018, superimposed acute fracture is not excluded. Electronically Signed   By: Abelardo Diesel M.D.   On: 08/27/2018 20:14   Dg Chest Port  1 View  Result Date: 08/29/2018 CLINICAL DATA:  Shoulder pain, shortness of breath EXAM: PORTABLE CHEST 1 VIEW COMPARISON:  08/27/2018 FINDINGS: Unchanged AP portable examination without acute abnormality of the lungs. Mild cardiomegaly with left chest multi lead pacer. IMPRESSION: Unchanged AP portable examination without acute abnormality of the lungs. Mild cardiomegaly with left chest multi lead pacer. Electronically Signed   By: Eddie Candle M.D.   On: 08/29/2018 09:03   Dg Chest Portable 1 View  Result Date: 08/27/2018 CLINICAL DATA:  Shortness of breath, abdominal swelling EXAM: PORTABLE CHEST 1 VIEW COMPARISON:  Portable exam 1733 hours compared to 07/13/2018 FINDINGS: Left subclavian sequential transvenous pacemaker leads project at right atrium and right ventricle. Minimal enlargement of cardiac silhouette. Mediastinal contours and pulmonary vascularity normal. Atherosclerotic calcification aorta. Bronchitic changes with subsegmental atelectasis LEFT base. Lungs otherwise clear. No pleural effusion or pneumothorax. Bones demineralized. IMPRESSION: Bronchitic changes with subsegmental atelectasis at LEFT base. Electronically Signed   By: Lavonia Dana M.D.   On: 08/27/2018 17:56   Dg Abd Portable 1v  Result Date: 08/29/2018 CLINICAL DATA:  Abdominal distention. EXAM: PORTABLE ABDOMEN - 1 VIEW COMPARISON:  Abdomen 08/28/2018.  CT 08/27/2018. FINDINGS: Persistent dilated loops of small bowel are again noted without interim change. Continued follow-up exam to demonstrate resolution suggested. Scratch stool noted throughout the colon. No free air. Stable degenerative changes and compression fractures lumbar spine. Degenerative changes both hips. Cardiac pacer wires noted over the heart. IMPRESSION: Persistent dilated loops of small bowel are again noted without interim change. Again small-bowel obstruction cannot be excluded. Continued follow-up exams to demonstrate resolution suggested. Stool again noted  throughout the colon. No free air. Electronically Signed   By: Marcello Moores  Register   On: 08/29/2018 08:36   Dg Abd Portable 1v  Result Date: 08/28/2018 CLINICAL DATA:  Abdominal distension, history CHF, hypertension, kidney stones EXAM: PORTABLE ABDOMEN - 1 VIEW COMPARISON:  CT abdomen pelvis 08/27/2018 FINDINGS: Excreted contrast material within bladder, which demonstrates trabeculation. Gaseous distention of small bowel loops. Prominent stool RIGHT colon. Scattered gas throughout remainder of colon. Degree of small bowel distension could either represent small bowel ileus or a component of small bowel obstruction, despite gas and stool in colon. No definite bowel wall thickening. Bones demineralized with degenerative disc/facet disease changes of the lumbar spine and old appearing L2 compression fracture. IMPRESSION: Gaseous distention of small bowel loops though prominent stool is present in the RIGHT colon and gas is seen throughout the remainder of the colon, either representing small-bowel ileus or obstruction. Trabeculated bladder. Osseous demineralization with L2 compression fracture Electronically Signed   By: Lavonia Dana M.D.   On: 08/28/2018 10:06        Scheduled Meds: . amiodarone  200 mg Oral Daily  . carvedilol  6.25 mg Oral BID WC  . diatrizoate meglumine-sodium  90 mL Oral Once  . diltiazem  240 mg Oral Daily  . furosemide  40 mg Oral Daily  . insulin aspart  0-5 Units Subcutaneous QHS  . insulin aspart  0-9 Units Subcutaneous TID WC  . mouth rinse  15 mL Mouth Rinse BID  . rosuvastatin  2.5 mg Oral Q M,W,F  . sertraline  50 mg Oral Daily   Continuous Infusions: . sodium chloride 250 mL (08/29/18 1002)  . dextrose 5 % and 0.45% NaCl 75 mL/hr at 08/29/18 0958  . potassium chloride 10 mEq (08/29/18 1006)     LOS: 2 days    Time spent: 36 minutes spent on chart review, personally reviewed all imaging studies/labs, discussion with nursing staff, consultants, updating family  and interview/physical exam; more than 50% of that time was spent in counseling and/or coordination of care.    Eric J [place] DO [facility] Pager [phone]  If 7PM-7AM, please contact night-coverage [email or web address] Password [email or web address] 08/29/2018, 11:04 AM

## 2018-08-29 NOTE — Progress Notes (Signed)
Jo [name] has questions, wants to speak with MD. Daughter POA give permission. MD notified.

## 2018-08-29 NOTE — Progress Notes (Signed)
Gastrografin completed at 1215 pm. Called radiology to let them now about contrast completion and about 8 and 24 hrs abdominal xray. They are aware. Spoke with surgery to clarify orders.

## 2018-08-30 ENCOUNTER — Inpatient Hospital Stay (HOSPITAL_COMMUNITY): Payer: Medicare Other

## 2018-08-30 ENCOUNTER — Encounter: Payer: Self-pay | Admitting: Cardiology

## 2018-08-30 LAB — BASIC METABOLIC PANEL
Anion gap: 11 (ref 5–15)
Anion gap: 7 (ref 5–15)
BUN: 11 mg/dL (ref 8–23)
BUN: 7 mg/dL — ABNORMAL LOW (ref 8–23)
CO2: 27 mmol/L (ref 22–32)
CO2: 29 mmol/L (ref 22–32)
Calcium: 8.3 mg/dL — ABNORMAL LOW (ref 8.9–10.3)
Calcium: 8.3 mg/dL — ABNORMAL LOW (ref 8.9–10.3)
Chloride: 99 mmol/L (ref 98–111)
Chloride: 97 mmol/L — ABNORMAL LOW (ref 98–111)
Creatinine, Ser: 0.7 mg/dL (ref 0.44–1.00)
Creatinine, Ser: 0.67 mg/dL (ref 0.44–1.00)
GFR calc Af Amer: >60 mL/min (ref >60)
GFR calc Af Amer: >60 mL/min (ref >60)
GFR calc non Af Amer: >60 mL/min (ref >60)
GFR calc non Af Amer: >60 mL/min (ref >60)
Glucose, Bld: 140 mg/dL — ABNORMAL HIGH (ref 70–99)
Glucose, Bld: 218 mg/dL — ABNORMAL HIGH (ref 70–99)
Potassium: 3.2 mmol/L — ABNORMAL LOW (ref 3.5–5.1)
Potassium: 3.7 mmol/L (ref 3.5–5.1)
Sodium: 137 mmol/L (ref 135–145)
Sodium: 133 mmol/L — ABNORMAL LOW (ref 135–145)

## 2018-08-30 LAB — GLUCOSE, CAPILLARY
Glucose-Capillary: 133 mg/dL — ABNORMAL HIGH (ref 70–99)
Glucose-Capillary: 204 mg/dL — ABNORMAL HIGH (ref 70–99)
Glucose-Capillary: 102 mg/dL — ABNORMAL HIGH (ref 70–99)
Glucose-Capillary: 143 mg/dL — ABNORMAL HIGH (ref 70–99)

## 2018-08-30 LAB — MAGNESIUM: Magnesium: 1.6 mg/dL — ABNORMAL LOW (ref 1.7–2.4)

## 2018-08-30 MED ORDER — POTASSIUM CHLORIDE 10 MEQ/100ML IV SOLN
10.0000 meq | INTRAVENOUS | Status: DC
  Administered 2018-08-30 (×2): 10 meq via INTRAVENOUS
  Filled 2018-08-30 (×2): qty 100

## 2018-08-30 MED ORDER — MAGNESIUM SULFATE 2 GM/50ML IV SOLN
2.0000 g | INTRAVENOUS | Status: AC
  Administered 2018-08-30 (×2): 2 g via INTRAVENOUS
  Filled 2018-08-30 (×2): qty 50

## 2018-08-30 MED ORDER — ERYTHROMYCIN BASE 250 MG PO TABS
250.0000 mg | ORAL_TABLET | Freq: Three times a day (TID) | ORAL | Status: DC
  Administered 2018-08-30 – 2018-08-31 (×5): 250 mg via ORAL
  Filled 2018-08-30 (×7): qty 1

## 2018-08-30 MED ORDER — DOCUSATE SODIUM 100 MG PO CAPS
100.0000 mg | ORAL_CAPSULE | Freq: Two times a day (BID) | ORAL | Status: DC
  Administered 2018-08-30 – 2018-09-01 (×4): 100 mg via ORAL
  Filled 2018-08-30 (×5): qty 1

## 2018-08-30 MED ORDER — POTASSIUM CHLORIDE 10 MEQ/100ML IV SOLN
10.0000 meq | INTRAVENOUS | Status: AC
  Administered 2018-08-30 (×4): 10 meq via INTRAVENOUS
  Filled 2018-08-30 (×4): qty 100

## 2018-08-30 NOTE — Progress Notes (Signed)
Remote pacemaker transmission.   

## 2018-08-30 NOTE — Consult Note (Signed)
   [facility] CM Inpatient Consult   08/30/2018  Marionette Meskill Solum April 24, 1917 [id number]     Patient screened for potential [facility] Care Management services due to high risk score of 23% for unplanned readmission and hospitalization under her Medicare/[employer] insurance plan.   Medical record review shows as follows:  Orly Quimby Spillers is a 85 y.o. female with medical history significant of hypertension, hyperlipidemia, diabetes mellitus, SSS, s/p of pacemaker placement, PAF on Eliquis, dCHF, anxiety, HOH, partial small bowel obstruction,  who presents with abdominal distention and shortness of breath. Patient was recently hospitalized from 5/30-6/3 due to CHFexacerbation. Patient came from home. At baseline, she is independent for most of ADLs. (partial small bowel obstruction vs constipation causing ileus)  Primary care provider is Dr. Crist Infante with [facility], listed to provide transition of care.  Per PT note reviewed, patient is being recommended for skilled nursing facility (SNF).  Will continue to follow for disposition and engage for potential [facility] Care Management if appropriate.   Please place a [place] Management consult for any changes in disposition and for follow-up as appropriate.    For questions and referral, please contact:    Kiani Wurtzel A. Tosha Belgarde, BSN, RN-BC [facility] Liaison Cell: [phone]

## 2018-08-30 NOTE — Progress Notes (Addendum)
Subjective: CC:  Patient reports that she feels less distended and tight this morning. We are able to communicate by writing on a piece of paper. She denies any abdominal pain, n/v. She is passing flatus. She had another BM yesterday. It appears she already has some clears from the floor and she states she has been tolerating.   Objective: Vital signs in last 24 hours: Temp:  [97.7 F (36.5 C)-98.5 F (36.9 C)] 98.5 F (36.9 C) (08/06 0447) Pulse Rate:  [61-79] 61 (08/06 0447) Resp:  [16-20] 20 (08/06 0447) BP: (130-144)/(49-59) 140/50 (08/06 0447) SpO2:  [87 %-99 %] 97 % (08/06 0447) Last BM Date: 08/29/18  Intake/Output from previous day: 08/05 0701 - 08/06 0700 In: 1200 [P.O.:50; I.V.:850; IV Piggyback:300] Out: 900 [Urine:900] Intake/Output this shift: No intake/output data recorded.  PE: Gen: Frail elderly female lying in bed in NAD Heart: regular  Lungs: CTA b/l, normal rate and effort Abd: Less distended and soft, NT, normoactive bowel sounds.  Msk: no edema  Lab Results:  Recent Labs    08/27/18 1807 08/28/18 0025  WBC 11.8* 12.4*  HGB 12.6 12.3  HCT 38.3 36.6  PLT 219 222   BMET Recent Labs    08/29/18 0640 08/30/18 0454  NA 139 137  K 3.1* 3.2*  CL 97* 99  CO2 25 27  GLUCOSE 132* 140*  BUN 11 11  CREATININE 0.92 0.70  CALCIUM 8.7* 8.3*   PT/INR Recent Labs    08/27/18 1807  LABPROT 17.2*  INR 1.4*   CMP     Component Value Date/Time   NA 137 08/30/2018 0454   NA 142 07/06/2015   K 3.2 (L) 08/30/2018 0454   CL 99 08/30/2018 0454   CO2 27 08/30/2018 0454   GLUCOSE 140 (H) 08/30/2018 0454   BUN 11 08/30/2018 0454   BUN 21 07/06/2015   CREATININE 0.70 08/30/2018 0454   CALCIUM 8.3 (L) 08/30/2018 0454   PROT 6.3 (L) 08/27/2018 1807   ALBUMIN 3.6 08/27/2018 1807   AST 19 08/27/2018 1807   ALT 17 08/27/2018 1807   ALKPHOS 75 08/27/2018 1807   BILITOT 0.8 08/27/2018 1807   GFRNONAA >60 08/30/2018 0454   GFRAA >60 08/30/2018  0454   Lipase     Component Value Date/Time   LIPASE 27 08/27/2018 1807       Studies/Results: Dg Chest Port 1 View  Result Date: 08/29/2018 CLINICAL DATA:  Shoulder pain, shortness of breath EXAM: PORTABLE CHEST 1 VIEW COMPARISON:  08/27/2018 FINDINGS: Unchanged AP portable examination without acute abnormality of the lungs. Mild cardiomegaly with left chest multi lead pacer. IMPRESSION: Unchanged AP portable examination without acute abnormality of the lungs. Mild cardiomegaly with left chest multi lead pacer. Electronically Signed   By: Eddie Candle M.D.   On: 08/29/2018 09:03   Dg Abd Portable 1v-small Bowel Obstruction Protocol-initial, 8 Hr Delay  Result Date: 08/29/2018 CLINICAL DATA:  Abdominal distension EXAM: PORTABLE ABDOMEN - 1 VIEW COMPARISON:  Same day radiographs FINDINGS: Redemonstration of the persistently dilated loops of small bowel throughout the abdomen several of the loops now appear to contain radiodense contrast medium. There is no passage of contrast to the level of the colon. Air and stool is present over several of the colonic loops. Other findings are not significantly changed from prior. IMPRESSION: Persistently dilated loops of small bowel concerning for obstruction or ileus. Passage of contrast material into the small bowel. No contrast material visualized within  the colon. Electronically Signed   By: Lovena Le M.D.   On: 08/29/2018 21:44   Dg Abd Portable 1v  Result Date: 08/29/2018 CLINICAL DATA:  Abdominal distention. EXAM: PORTABLE ABDOMEN - 1 VIEW COMPARISON:  Abdomen 08/28/2018.  CT 08/27/2018. FINDINGS: Persistent dilated loops of small bowel are again noted without interim change. Continued follow-up exam to demonstrate resolution suggested. Scratch stool noted throughout the colon. No free air. Stable degenerative changes and compression fractures lumbar spine. Degenerative changes both hips. Cardiac pacer wires noted over the heart. IMPRESSION:  Persistent dilated loops of small bowel are again noted without interim change. Again small-bowel obstruction cannot be excluded. Continued follow-up exams to demonstrate resolution suggested. Stool again noted throughout the colon. No free air. Electronically Signed   By: Marcello Moores  Register   On: 08/29/2018 08:36   Dg Abd Portable 1v  Result Date: 08/28/2018 CLINICAL DATA:  Abdominal distension, history CHF, hypertension, kidney stones EXAM: PORTABLE ABDOMEN - 1 VIEW COMPARISON:  CT abdomen pelvis 08/27/2018 FINDINGS: Excreted contrast material within bladder, which demonstrates trabeculation. Gaseous distention of small bowel loops. Prominent stool RIGHT colon. Scattered gas throughout remainder of colon. Degree of small bowel distension could either represent small bowel ileus or a component of small bowel obstruction, despite gas and stool in colon. No definite bowel wall thickening. Bones demineralized with degenerative disc/facet disease changes of the lumbar spine and old appearing L2 compression fracture. IMPRESSION: Gaseous distention of small bowel loops though prominent stool is present in the RIGHT colon and gas is seen throughout the remainder of the colon, either representing small-bowel ileus or obstruction. Trabeculated bladder. Osseous demineralization with L2 compression fracture Electronically Signed   By: Lavonia Dana M.D.   On: 08/28/2018 10:06    Anti-infectives: Anti-infectives (From admission, onward)   Start     Dose/Rate Route Frequency Ordered Stop   08/29/18 1800  erythromycin 250 mg in sodium chloride 0.9 % 100 mL IVPB     250 mg 100 mL/hr over 60 Minutes Intravenous Every 6 hours 08/29/18 1643         Assessment/Plan HTN DM Afibon Eliquis at home - on hold  Aortic insuff dCHF Sick sinus syndrome SOB - per medicine   Possible pSBOvs constipation causing ileus -Suspect likely underlying constipation given lack of any clear transition point, no n/v, and stool  burden noted on CTbut could also have some degree of pSBO given surgical hx(appendectomy) - SBO orally. Xray yesterday with small bowel dilatation and contrast in SB. Clinically doing well however and tolerating clears from the floor. Having bowel function. Will advance to fulls.  - If she develops emesis, worsening abdominal distension, can place NG tube - Keep Mg> 2 and K >4 for bowel function - Mobilize for bowel function. PT ordered by primary team - Minimize narcotics to avoid worsening constipation   FEN: Fulls. K 3.2.  Mg 1.6 (K and Mg being replaced) VTE: SCDs ID: None indicated from a surgery standpoint POC - Chrystie Nose (Daughter) [phone] (home), [phone] (cell)   LOS: 3 days    Jillyn Ledger , [facility] 08/30/2018, 8:34 AM Pager: [phone]

## 2018-08-30 NOTE — Progress Notes (Signed)
PROGRESS NOTE    Jo [name]  ZOX:[id number] DOB: 1917/07/29 DOA: 08/27/2018 PCP: Crist Infante, MD    Brief Narrative:   Jo [name] is a 85 y.o. female with medical history significant of hypertension, hyperlipidemia, diabetes mellitus, SSS, s/p of pacemaker placement, PAF on Eliquis, dCHF, anxiety, HOH, partial small bowel obstruction, who presents with abdominal distention and shortness of breath.  Per pt's daughter (I called her daughter by phone), patient has been having abdominal swelling and abdominal distention for several days.  Patient does not seem to seem to have abdominal pain or active vomiting per her daughter.  Last bowel movement was 2 days ago.  Her daughter states that they treated her with laxatives including MiraLAX and pt had some bowel movement. Pt also has shortness of breath, but no cough, fever, chills, chest pain.  Daughter states that patient has gained approximately 3 pounds last week.  Patient was recently hospitalized from 5/30-6/3 due to CHF exacerbation.  After she went home, she did not need oxygen, but today patient had oxygen desaturation to 88% on room air, and required 2 L nasal cannula oxygen at home per her daughter.  Patient has a generalized weakness, but no unilateral tenderness or numbness.  No facial droop or slurred speech no symptoms of UTI.  Patient seems to have right hip pain per her daughter.  ED Course: pt was found to have BNP 228, lipase 27, LFT normal, creatinine BUN normal, WBC 11.8, temperature normal, blood pressure 171/107, heart rate 70, oxygen saturation 94%.  Chest x-ray showed a bronchitic change with possible left base atelectasis. Pt is admitted to tele bed as inpt.  Assessment & Plan:   Principal Problem:   Partial small bowel obstruction (HCC) Active Problems:   PAF (paroxysmal atrial fibrillation) (HCC)   Hypertension   Diabetes mellitus without complication (HCC)   Anxiety   Dyslipidemia   Chronic diastolic CHF (congestive  heart failure) (HCC)   Possible partial small bowel obstruction vs constipation causing ileus: Patient presenting with progressive abdominal distention.  Thought to be initially from volume overload by family members.  CT abdomen/pelvis shows multiple dilated small bowel loops consistent with early versus partial small bowel obstruction. --General surgery following, appreciate assistance --Try to avoid invasive procedures such as NG tube or surgical intervention --Continues with bowel movements --KUB this afternoon, now shows contrast in the colon and rectum --Diet advanced to full liquids per general surgery this morning --Erythromycin 250 mg p.o. 3 times daily --PT to assist with mobilization --Further per general surgery  Hypokalemia: Hypomagnesemia: Potassium 3.2 and magnesium 1.6 this morning, will replete. --Repeat electrolytes to include magnesium in the a.m.  PAF (paroxysmal atrial fibrillation) CHA2DS2-VASc Score is 6. Patient is on Eliquis at home. Heart rate is well controlled. --holding Eliquis in case pt needs surgery  --continue amiodarone, Coreg, Cardizem  Essential hypertension: --Continue Coreg and Cardizem --IV hydralazine as needed  Diabetes mellitus without complication: Last W1X 7.6.. Patient is taking Januvia at home --Hold home medication regimen --Insulin sliding scale for coverage while inpatient  Anxiety: --Continue home ativan prn  Dyslipidemia: --Crestor  Chronic diastolic CHF (congestive heart failure) 2D echo on 06/05/2017 showed EF 65-70%.  BNP slightly elevated 228.  Patient has worsening shortness of breath.  She [place] have a mild CHF exacerbation. --will continue oral lasix 40 mg daily --Wean supplemental oxygen to maintain SPO2 greater than 92% --Daily weights, strict I's and O's  Adrenal mass:  US showed a right adrenal mass  measuring up to 5.8 cm in size, similar dimensions 2 most recent CT. A previously identified left adrenal  mass and enhancing lesion along the falciform ligament are not well visualized. --Recommend follow-up outpatient with PCP, given her advanced age, likely not optimal surgical candidate   DVT prophylaxis: SCDs, heparin (home eliquis on hold for possible surgical intervention) Code Status: DNR Family Communication: none Disposition Plan: Continue inpatient hospitalization, further dependent on clinical course, pending sign off from general surgery, PT recommends SNF, social work for coordination   Consultants:   General surgery  Procedures:   None  Antimicrobials:   None   Subjective: Patient seen and examined at bedside, resting comfortably.  Reports abdominal distention improved.  Continues with bowel movements.  No other specific complaints or concerns at this time.  Denies headache, no fever/chills/night sweats, no nausea/vomiting/diarrhea, no chest pain, no palpitations, no abdominal pain, no weakness, no issues with bladder function, no paresthesias.  No acute events overnight per nursing staff.  Objective: Vitals:   08/29/18 2009 08/30/18 0124 08/30/18 0447 08/30/18 1213  BP: (!) 144/50 (!) 130/58 (!) 140/50 (!) 124/52  Pulse: 68 79 61 60  Resp: 20 19 20 18   Temp: 98.2 F (36.8 C) 98.2 F (36.8 C) 98.5 F (36.9 C) 98.4 F (36.9 C)  TempSrc: Oral Oral Oral Oral  SpO2: 98% 99% 97% 99%  Weight:      Height:        Intake/Output Summary (Last 24 hours) at 08/30/2018 1347 Last data filed at 08/30/2018 1024 Gross per 24 hour  Intake 1320 ml  Output 600 ml  Net 720 ml   Filed Weights   08/27/18 2218 08/28/18 0348 08/29/18 0346  Weight: 54 kg 54.8 kg 53.5 kg    Examination:  General exam: Appears calm and comfortable  Respiratory system: Clear to auscultation. Respiratory effort normal.  On 3 L nasal cannula oxygenating 99%. Cardiovascular system: S1 & S2 heard, RRR. No JVD, murmurs, rubs, gallops or clicks. No pedal edema. Gastrointestinal system: Abdomen is  distended, soft and mild generalized tenderness. No organomegaly or masses felt.  Faint bowel sounds noted. Central nervous system: Alert and oriented. No focal neurological deficits. Extremities: Symmetric 5 x 5 power. Skin: No rashes, lesions or ulcers Psychiatry: Judgement and insight appear normal. Mood & affect appropriate.     Data Reviewed: I have personally reviewed following labs and imaging studies  CBC: Recent Labs  Lab 08/27/18 1807 08/28/18 0025  WBC 11.8* 12.4*  HGB 12.6 12.3  HCT 38.3 36.6  MCV 100.5* 97.9  PLT 219 650   Basic Metabolic Panel: Recent Labs  Lab 08/27/18 1807 08/28/18 0025 08/29/18 0640 08/30/18 0454 08/30/18 1233  NA 134* 135 139 137 133*  K 4.3 4.1 3.1* 3.2* 3.7  CL 99 98 97* 99 97*  CO2 26 26 25 27 29   GLUCOSE 171* 160* 132* 140* 218*  BUN 13 12 11 11  7*  CREATININE 0.73 0.70 0.92 0.70 0.67  CALCIUM 9.4 9.4 8.7* 8.3* 8.3*  MG  --   --  1.8 1.6*  --    GFR: Estimated Creatinine Clearance: 30.3 mL/min (by C-G formula based on SCr of 0.67 mg/dL). Liver Function Tests: Recent Labs  Lab 08/27/18 1807  AST 19  ALT 17  ALKPHOS 75  BILITOT 0.8  PROT 6.3*  ALBUMIN 3.6   Recent Labs  Lab 08/27/18 1807  LIPASE 27   No results for input(s): AMMONIA in the last 168 hours. Coagulation Profile: Recent  Labs  Lab 08/27/18 1807  INR 1.4*   Cardiac Enzymes: No results for input(s): CKTOTAL, CKMB, CKMBINDEX, TROPONINI in the last 168 hours. BNP (last 3 results) No results for input(s): PROBNP in the last 8760 hours. HbA1C: No results for input(s): HGBA1C in the last 72 hours. CBG: Recent Labs  Lab 08/29/18 1221 08/29/18 1622 08/29/18 2159 08/30/18 0622 08/30/18 1223  GLUCAP 183* 134* 110* 133* 204*   Lipid Profile: No results for input(s): CHOL, HDL, LDLCALC, TRIG, CHOLHDL, LDLDIRECT in the last 72 hours. Thyroid Function Tests: No results for input(s): TSH, T4TOTAL, FREET4, T3FREE, THYROIDAB in the last 72 hours. Anemia  Panel: No results for input(s): VITAMINB12, FOLATE, FERRITIN, TIBC, IRON, RETICCTPCT in the last 72 hours. Sepsis Labs: No results for input(s): PROCALCITON, LATICACIDVEN in the last 168 hours.  Recent Results (from the past 240 hour(s))  SARS CORONAVIRUS 2 Nasal Swab Aptima Multi Swab     Status: None   Collection Time: 08/27/18  8:51 PM   Specimen: Aptima Multi Swab; Nasal Swab  Result Value Ref Range Status   SARS Coronavirus 2 NEGATIVE NEGATIVE Final    Comment: (NOTE) SARS-CoV-2 target nucleic acids are NOT DETECTED. The SARS-CoV-2 RNA is generally detectable in upper and lower respiratory specimens during the acute phase of infection. Negative results do not preclude SARS-CoV-2 infection, do not rule out co-infections with other pathogens, and should not be used as the sole basis for treatment or other patient management decisions. Negative results must be combined with clinical observations, patient history, and epidemiological information. The expected result is Negative. Fact Sheet for Patients: SugarRoll.be Fact Sheet for Healthcare Providers: https://www.woods-mathews.com/ This test is not yet approved or cleared by the [place] FDA and  has been authorized for detection and/or diagnosis of SARS-CoV-2 by FDA under an Emergency Use Authorization (EUA). This EUA will remain  in effect (meaning this test can be used) for the duration of the COVID-19 declaration under Section 56 4(b)(1) of the Act, 21 U.S.C. section 360bbb-3(b)(1), unless the authorization is terminated or revoked sooner. Performed at [facility], [place] [address], [place], [place] [address]          Radiology Studies: Dg Chest Port 1 View  Result Date: 08/29/2018 CLINICAL DATA:  Shoulder pain, shortness of breath EXAM: PORTABLE CHEST 1 VIEW COMPARISON:  08/27/2018 FINDINGS: Unchanged AP portable examination without acute abnormality of the lungs.  Mild cardiomegaly with left chest multi lead pacer. IMPRESSION: Unchanged AP portable examination without acute abnormality of the lungs. Mild cardiomegaly with left chest multi lead pacer. Electronically Signed   By: Eddie Candle M.D.   On: 08/29/2018 09:03   Dg Abd Portable 1v-small Bowel Obstruction Protocol-24 Hr Delay  Result Date: 08/30/2018 CLINICAL DATA:  Evaluate 8 hours after contrast.  Pain. EXAM: PORTABLE ABDOMEN - 1 VIEW COMPARISON:  August 29, 2018 FINDINGS: Contrast has traversed the small bowel and is now located in the colon. Small bowel loops are prominent but less dilated in the interval. IMPRESSION: Decreasing obstruction or ileus. Contrast is now seen in the colon and the rectum. Electronically Signed   By: Dorise Bullion III M.D   On: 08/30/2018 13:40   Dg Abd Portable 1v-small Bowel Obstruction Protocol-initial, 8 Hr Delay  Result Date: 08/29/2018 CLINICAL DATA:  Abdominal distension EXAM: PORTABLE ABDOMEN - 1 VIEW COMPARISON:  Same day radiographs FINDINGS: Redemonstration of the persistently dilated loops of small bowel throughout the abdomen several of the loops now appear to contain radiodense  contrast medium. There is no passage of contrast to the level of the colon. Air and stool is present over several of the colonic loops. Other findings are not significantly changed from prior. IMPRESSION: Persistently dilated loops of small bowel concerning for obstruction or ileus. Passage of contrast material into the small bowel. No contrast material visualized within the colon. Electronically Signed   By: Lovena Le M.D.   On: 08/29/2018 21:44   Dg Abd Portable 1v  Result Date: 08/29/2018 CLINICAL DATA:  Abdominal distention. EXAM: PORTABLE ABDOMEN - 1 VIEW COMPARISON:  Abdomen 08/28/2018.  CT 08/27/2018. FINDINGS: Persistent dilated loops of small bowel are again noted without interim change. Continued follow-up exam to demonstrate resolution suggested. Scratch stool noted throughout  the colon. No free air. Stable degenerative changes and compression fractures lumbar spine. Degenerative changes both hips. Cardiac pacer wires noted over the heart. IMPRESSION: Persistent dilated loops of small bowel are again noted without interim change. Again small-bowel obstruction cannot be excluded. Continued follow-up exams to demonstrate resolution suggested. Stool again noted throughout the colon. No free air. Electronically Signed   By: Marcello Moores  Register   On: 08/29/2018 08:36        Scheduled Meds:  amiodarone  200 mg Oral Daily   carvedilol  6.25 mg Oral BID WC   diltiazem  240 mg Oral Daily   docusate sodium  100 mg Oral BID   furosemide  40 mg Oral Daily   heparin injection (subcutaneous)  5,000 Units Subcutaneous Q8H   insulin aspart  0-5 Units Subcutaneous QHS   insulin aspart  0-9 Units Subcutaneous TID WC   mouth rinse  15 mL Mouth Rinse BID   rosuvastatin  2.5 mg Oral Q M,W,F   sertraline  50 mg Oral Daily   Continuous Infusions:  sodium chloride 250 mL (08/30/18 0621)   sodium chloride     dextrose 5 % and 0.45% NaCl 75 mL/hr at 08/30/18 6237   erythromycin 250 mg (08/30/18 1039)   potassium chloride 10 mEq (08/30/18 1250)     LOS: 3 days    Time spent: 36 minutes spent on chart review, personally reviewed all imaging studies/labs, discussion with nursing staff, consultants, updating family and interview/physical exam; more than 50% of that time was spent in counseling and/or coordination of care.    Jahfari Ambers J [place] DO [facility] Pager [phone]  If 7PM-7AM, please contact night-coverage [email or web address] Password [email or web address] 08/30/2018, 1:47 PM

## 2018-08-30 NOTE — Final Consult Note (Signed)
Consultant Final Sign-Off Note    Assessment/Final recommendations  Jo [name] is a 85 y.o. female followed by me for Possible pSBOvs constipation causing ileus  Patient is having bowel function, tolerating a FLD, and has contrast in her colon and rectum on xray. Diet advanced. No indication for surgery. We will sign off. Thank you for allowing [place] to participate in the care of your patient. Please consult [place] again if you have further needs for your patient. Daughter was at bedside and updated on plan of care.   Wound care (if applicable):    Diet at discharge: per primary team. Patient could benefit from a high fiber diet to prevent constipation.    Activity at discharge: per primary team   Follow-up appointment: PRN   Pending results:  Unresulted Labs (From admission, onward)    Start     Ordered   08/31/18 9528  Basic metabolic panel  Daily,   R     08/30/18 0644   08/31/18 0500  Magnesium  Tomorrow morning,   R     08/30/18 0644           Medication recommendations: Avoid narcotics   Other recommendations: Stool softeners, miralax, milk of mag etc for constipation     Thank you for allowing [place] to participate in the care of your patient!  Please consult [place] again if you have further needs for your patient.  Barth Kirks [facility] 08/30/2018 2:54 PM    Subjective   Patient feels better. No abdominal pain. Passing flatus. Had a BM this AM. Tolerated FLD. Daughter, [name] at bedside.   Objective  Vital signs in last 24 hours: Temp:  [97.8 F (36.6 C)-98.5 F (36.9 C)] 98.4 F (36.9 C) (08/06 1213) Pulse Rate:  [60-79] 60 (08/06 1213) Resp:  [16-20] 18 (08/06 1213) BP: (124-144)/(50-58) 124/52 (08/06 1213) SpO2:  [97 %-99 %] 99 % (08/06 1213)  Gen: Frail elderly female lying in bed in NAD UXL:KGMW distended andsoft, NT, normoactive bowel sounds.   Pertinent labs and Studies: Recent Labs    08/27/18 1807 08/28/18 0025  WBC 11.8* 12.4*  HGB 12.6 12.3  HCT 38.3  36.6   BMET Recent Labs    08/30/18 0454 08/30/18 1233  NA 137 133*  K 3.2* 3.7  CL 99 97*  CO2 27 29  GLUCOSE 140* 218*  BUN 11 7*  CREATININE 0.70 0.67  CALCIUM 8.3* 8.3*   No results for input(s): LABURIN in the last 72 hours. Results for orders placed or performed during the hospital encounter of 08/27/18  SARS CORONAVIRUS 2 Nasal Swab Aptima Multi Swab     Status: None   Collection Time: 08/27/18  8:51 PM   Specimen: Aptima Multi Swab; Nasal Swab  Result Value Ref Range Status   SARS Coronavirus 2 NEGATIVE NEGATIVE Final    Comment: (NOTE) SARS-CoV-2 target nucleic acids are NOT DETECTED. The SARS-CoV-2 RNA is generally detectable in upper and lower respiratory specimens during the acute phase of infection. Negative results do not preclude SARS-CoV-2 infection, do not rule out co-infections with other pathogens, and should not be used as the sole basis for treatment or other patient management decisions. Negative results must be combined with clinical observations, patient history, and epidemiological information. The expected result is Negative. Fact Sheet for Patients: SugarRoll.be Fact Sheet for Healthcare Providers: https://www.woods-mathews.com/ This test is not yet approved or cleared by the [place] FDA and  has been authorized for detection and/or diagnosis of SARS-CoV-2  by FDA under an Emergency Use Authorization (EUA). This EUA will remain  in effect (meaning this test can be used) for the duration of the COVID-19 declaration under Section 56 4(b)(1) of the Act, 21 U.S.C. section 360bbb-3(b)(1), unless the authorization is terminated or revoked sooner. Performed at [facility], [place] [address], [place], [place] [address]     Imaging: Dg Abd Portable 1v-small Bowel Obstruction Protocol-24 Hr Delay  Result Date: 08/30/2018 CLINICAL DATA:  Evaluate 8 hours after contrast.  Pain. EXAM: PORTABLE ABDOMEN -  1 VIEW COMPARISON:  August 29, 2018 FINDINGS: Contrast has traversed the small bowel and is now located in the colon. Small bowel loops are prominent but less dilated in the interval. IMPRESSION: Decreasing obstruction or ileus. Contrast is now seen in the colon and the rectum. Electronically Signed   By: Dorise Bullion III M.D   On: 08/30/2018 13:40   Dg Abd Portable 1v-small Bowel Obstruction Protocol-initial, 8 Hr Delay  Result Date: 08/29/2018 CLINICAL DATA:  Abdominal distension EXAM: PORTABLE ABDOMEN - 1 VIEW COMPARISON:  Same day radiographs FINDINGS: Redemonstration of the persistently dilated loops of small bowel throughout the abdomen several of the loops now appear to contain radiodense contrast medium. There is no passage of contrast to the level of the colon. Air and stool is present over several of the colonic loops. Other findings are not significantly changed from prior. IMPRESSION: Persistently dilated loops of small bowel concerning for obstruction or ileus. Passage of contrast material into the small bowel. No contrast material visualized within the colon. Electronically Signed   By: Lovena Le M.D.   On: 08/29/2018 21:44

## 2018-08-30 NOTE — Care Management Important Message (Signed)
Important Message  Patient Details  Name: Jo [name] MRN: [id number] Date of Birth: 06-Feb-1917   Medicare Important Message Given:  Yes     Shelda Altes 08/30/2018, 12:04 PM

## 2018-08-30 NOTE — Progress Notes (Signed)
Patient was complaining of not feeling well and like her Heart was beating out of her chest. Patient's vitals were stable. No SOB or CP. Patients HR was 67. Paged MD and gave orders to watch pt and to give prn ativan if needed to pt for anxiety.

## 2018-08-31 LAB — BASIC METABOLIC PANEL
Anion gap: 12 (ref 5–15)
BUN: 5 mg/dL — ABNORMAL LOW (ref 8–23)
CO2: 27 mmol/L (ref 22–32)
Calcium: 8.6 mg/dL — ABNORMAL LOW (ref 8.9–10.3)
Chloride: 96 mmol/L — ABNORMAL LOW (ref 98–111)
Creatinine, Ser: 0.71 mg/dL (ref 0.44–1.00)
GFR calc Af Amer: >60 mL/min (ref >60)
GFR calc non Af Amer: >60 mL/min (ref >60)
Glucose, Bld: 142 mg/dL — ABNORMAL HIGH (ref 70–99)
Potassium: 3.5 mmol/L (ref 3.5–5.1)
Sodium: 135 mmol/L (ref 135–145)

## 2018-08-31 LAB — MAGNESIUM: Magnesium: 2.1 mg/dL (ref 1.7–2.4)

## 2018-08-31 LAB — GLUCOSE, CAPILLARY
Glucose-Capillary: 149 mg/dL — ABNORMAL HIGH (ref 70–99)
Glucose-Capillary: 141 mg/dL — ABNORMAL HIGH (ref 70–99)
Glucose-Capillary: 116 mg/dL — ABNORMAL HIGH (ref 70–99)
Glucose-Capillary: 131 mg/dL — ABNORMAL HIGH (ref 70–99)

## 2018-08-31 MED ORDER — POTASSIUM CHLORIDE CRYS ER 20 MEQ PO TBCR
40.0000 meq | EXTENDED_RELEASE_TABLET | Freq: Once | ORAL | Status: AC
  Administered 2018-08-31: 11:00:00 40 meq via ORAL
  Filled 2018-08-31: qty 2

## 2018-08-31 MED ORDER — APIXABAN 2.5 MG PO TABS
2.5000 mg | ORAL_TABLET | Freq: Two times a day (BID) | ORAL | Status: DC
  Administered 2018-08-31 – 2018-09-02 (×5): 2.5 mg via ORAL
  Filled 2018-08-31 (×5): qty 1

## 2018-08-31 NOTE — Progress Notes (Signed)
PROGRESS NOTE    Jo [name]  LFY:[id number] DOB: March 30, 1917 DOA: 08/27/2018 PCP: Crist Infante, MD    Brief Narrative:   Jo [name] is a 85 y.o. female with medical history significant of hypertension, hyperlipidemia, diabetes mellitus, SSS, s/p of pacemaker placement, PAF on Eliquis, dCHF, anxiety, HOH, partial small bowel obstruction, who presents with abdominal distention and shortness of breath.  Per pt's daughter (I called her daughter by phone), patient has been having abdominal swelling and abdominal distention for several days.  Patient does not seem to seem to have abdominal pain or active vomiting per her daughter.  Last bowel movement was 2 days ago.  Her daughter states that they treated her with laxatives including MiraLAX and pt had some bowel movement. Pt also has shortness of breath, but no cough, fever, chills, chest pain.  Daughter states that patient has gained approximately 3 pounds last week.  Patient was recently hospitalized from 5/30-6/3 due to CHF exacerbation.  After she went home, she did not need oxygen, but today patient had oxygen desaturation to 88% on room air, and required 2 L nasal cannula oxygen at home per her daughter.  Patient has a generalized weakness, but no unilateral tenderness or numbness.  No facial droop or slurred speech no symptoms of UTI.  Patient seems to have right hip pain per her daughter.  ED Course: pt was found to have BNP 228, lipase 27, LFT normal, creatinine BUN normal, WBC 11.8, temperature normal, blood pressure 171/107, heart rate 70, oxygen saturation 94%.  Chest x-ray showed a bronchitic change with possible left base atelectasis. Pt is admitted to tele bed as inpt.  Assessment & Plan:   Principal Problem:   Partial small bowel obstruction (HCC) Active Problems:   PAF (paroxysmal atrial fibrillation) (HCC)   Hypertension   Diabetes mellitus without complication (HCC)   Anxiety   Dyslipidemia   Chronic diastolic CHF (congestive  heart failure) (HCC)   Possible partial small bowel obstruction vs constipation causing ileus: resolved Patient presenting with progressive abdominal distention.  Thought to be initially from volume overload by family members.  CT abdomen/pelvis shows multiple dilated small bowel loops consistent with early versus partial small bowel obstruction.  General surgery was consulted and followed during hospital course.  She underwent small bowel protocol with improvement of her abdominal distention and resumption of normalized bowel movements.   --Tolerating diet without nausea/vomiting --continue erythromycin 250 mg p.o. 3 times daily.   --Continue PT efforts.   --Continue to monitor bowel movements  Hypokalemia: Hypomagnesemia: Potassium 3.5 and magnesium 2.1 this morning, will replete K today --Repeat electrolytes to include magnesium in the a.m.  PAF (paroxysmal atrial fibrillation) CHA2DS2-VASc Score is 6. Patient is on Eliquis at home. Heart rate is well controlled. --Resume Eliquis today --continue amiodarone, Coreg, Cardizem  Essential hypertension: --Continue Coreg and Cardizem --IV hydralazine as needed  Diabetes mellitus without complication: Last E5I 7.6. Patient is taking Januvia at home --Hold home medication regimen --Insulin sliding scale for coverage while inpatient  Anxiety: --Continue home ativan prn  Dyslipidemia: --Crestor  Chronic diastolic CHF (congestive heart failure) 2D echo on 06/05/2017 showed EF 65-70%.  BNP slightly elevated 228.  Patient has worsening shortness of breath.  She Fang have a mild CHF exacerbation. --will continue oral lasix 40 mg daily --Wean supplemental oxygen to maintain SPO2 greater than 92% --Daily weights, strict I's and O's  Adrenal mass:  US showed a right adrenal mass measuring up to 5.8 cm in  size, similar dimensions 2 most recent CT. A previously identified left adrenal mass and enhancing lesion along the falciform  ligament are not well visualized. --Recommend follow-up outpatient with PCP, given her advanced age, likely not optimal surgical candidate   DVT prophylaxis: SCDs, Eliquis Code Status: DNR Family Communication: none Disposition Plan: Continue inpatient hospitalization, pending discharge to SNF, social work for coordination   Consultants:   General surgery -signed off 8/6  Procedures:   None  Antimicrobials:   None   Subjective: Patient seen and examined at bedside, resting comfortably.  Reports abdominal distention improved.  Continues with bowel movements.  Tolerating diet.  No other specific complaints or concerns at this time.  Denies headache, no fever/chills/night sweats, no nausea/vomiting/diarrhea, no chest pain, no palpitations, no abdominal pain, no weakness, no issues with bladder function, no paresthesias.  No acute events overnight per nursing staff.  Objective: Vitals:   08/30/18 1213 08/30/18 2012 08/31/18 0249 08/31/18 0559  BP: (!) 124/52 (!) 152/60  (!) 143/53  Pulse: 60 67  62  Resp: 18 20  20   Temp: 98.4 F (36.9 C) 97.8 F (36.6 C)  98.2 F (36.8 C)  TempSrc: Oral Oral  Oral  SpO2: 99% 100%  99%  Weight:   53 kg   Height:        Intake/Output Summary (Last 24 hours) at 08/31/2018 1056 Last data filed at 08/31/2018 0935 Gross per 24 hour  Intake 290 ml  Output 1950 ml  Net -1660 ml   Filed Weights   08/28/18 0348 08/29/18 0346 08/31/18 0249  Weight: 54.8 kg 53.5 kg 53 kg    Examination:  General exam: Appears calm and comfortable  Respiratory system: Clear to auscultation. Respiratory effort normal.  On 3 L nasal cannula oxygenating 99%. Cardiovascular system: S1 & S2 heard, RRR. No JVD, murmurs, rubs, gallops or clicks. No pedal edema. Gastrointestinal system: Abdomen NTND, No organomegaly or masses felt.  Normal bowel sounds present. Central nervous system: Alert and oriented. No focal neurological deficits. Extremities: Symmetric 5 x 5  power. Skin: No rashes, lesions or ulcers Psychiatry: Judgement and insight appear normal. Mood & affect appropriate.     Data Reviewed: I have personally reviewed following labs and imaging studies  CBC: Recent Labs  Lab 08/27/18 1807 08/28/18 0025  WBC 11.8* 12.4*  HGB 12.6 12.3  HCT 38.3 36.6  MCV 100.5* 97.9  PLT 219 096   Basic Metabolic Panel: Recent Labs  Lab 08/28/18 0025 08/29/18 0640 08/30/18 0454 08/30/18 1233 08/31/18 0549  NA 135 139 137 133* 135  K 4.1 3.1* 3.2* 3.7 3.5  CL 98 97* 99 97* 96*  CO2 26 25 27 29 27   GLUCOSE 160* 132* 140* 218* 142*  BUN 12 11 11  7* 5*  CREATININE 0.70 0.92 0.70 0.67 0.71  CALCIUM 9.4 8.7* 8.3* 8.3* 8.6*  MG  --  1.8 1.6*  --  2.1   GFR: Estimated Creatinine Clearance: 30.3 mL/min (by C-G formula based on SCr of 0.71 mg/dL). Liver Function Tests: Recent Labs  Lab 08/27/18 1807  AST 19  ALT 17  ALKPHOS 75  BILITOT 0.8  PROT 6.3*  ALBUMIN 3.6   Recent Labs  Lab 08/27/18 1807  LIPASE 27   No results for input(s): AMMONIA in the last 168 hours. Coagulation Profile: Recent Labs  Lab 08/27/18 1807  INR 1.4*   Cardiac Enzymes: No results for input(s): CKTOTAL, CKMB, CKMBINDEX, TROPONINI in the last 168 hours. BNP (last 3 results)  No results for input(s): PROBNP in the last 8760 hours. HbA1C: No results for input(s): HGBA1C in the last 72 hours. CBG: Recent Labs  Lab 08/30/18 0622 08/30/18 1223 08/30/18 1612 08/30/18 2148 08/31/18 0650  GLUCAP 133* 204* 102* 143* 149*   Lipid Profile: No results for input(s): CHOL, HDL, LDLCALC, TRIG, CHOLHDL, LDLDIRECT in the last 72 hours. Thyroid Function Tests: No results for input(s): TSH, T4TOTAL, FREET4, T3FREE, THYROIDAB in the last 72 hours. Anemia Panel: No results for input(s): VITAMINB12, FOLATE, FERRITIN, TIBC, IRON, RETICCTPCT in the last 72 hours. Sepsis Labs: No results for input(s): PROCALCITON, LATICACIDVEN in the last 168 hours.  Recent Results  (from the past 240 hour(s))  SARS CORONAVIRUS 2 Nasal Swab Aptima Multi Swab     Status: None   Collection Time: 08/27/18  8:51 PM   Specimen: Aptima Multi Swab; Nasal Swab  Result Value Ref Range Status   SARS Coronavirus 2 NEGATIVE NEGATIVE Final    Comment: (NOTE) SARS-CoV-2 target nucleic acids are NOT DETECTED. The SARS-CoV-2 RNA is generally detectable in upper and lower respiratory specimens during the acute phase of infection. Negative results do not preclude SARS-CoV-2 infection, do not rule out co-infections with other pathogens, and should not be used as the sole basis for treatment or other patient management decisions. Negative results must be combined with clinical observations, patient history, and epidemiological information. The expected result is Negative. Fact Sheet for Patients: SugarRoll.be Fact Sheet for Healthcare Providers: https://www.woods-mathews.com/ This test is not yet approved or cleared by the [place] FDA and  has been authorized for detection and/or diagnosis of SARS-CoV-2 by FDA under an Emergency Use Authorization (EUA). This EUA will remain  in effect (meaning this test can be used) for the duration of the COVID-19 declaration under Section 56 4(b)(1) of the Act, 21 U.S.C. section 360bbb-3(b)(1), unless the authorization is terminated or revoked sooner. Performed at [facility], [place] [address] [place], [place] [address]          Radiology Studies: Dg Abd Portable 1v-small Bowel Obstruction Protocol-24 Hr Delay  Result Date: 08/30/2018 CLINICAL DATA:  Evaluate 8 hours after contrast.  Pain. EXAM: PORTABLE ABDOMEN - 1 VIEW COMPARISON:  August 29, 2018 FINDINGS: Contrast has traversed the small bowel and is now located in the colon. Small bowel loops are prominent but less dilated in the interval. IMPRESSION: Decreasing obstruction or ileus. Contrast is now seen in the colon and the rectum.  Electronically Signed   By: Dorise Bullion III M.D   On: 08/30/2018 13:40   Dg Abd Portable 1v-small Bowel Obstruction Protocol-initial, 8 Hr Delay  Result Date: 08/29/2018 CLINICAL DATA:  Abdominal distension EXAM: PORTABLE ABDOMEN - 1 VIEW COMPARISON:  Same day radiographs FINDINGS: Redemonstration of the persistently dilated loops of small bowel throughout the abdomen several of the loops now appear to contain radiodense contrast medium. There is no passage of contrast to the level of the colon. Air and stool is present over several of the colonic loops. Other findings are not significantly changed from prior. IMPRESSION: Persistently dilated loops of small bowel concerning for obstruction or ileus. Passage of contrast material into the small bowel. No contrast material visualized within the colon. Electronically Signed   By: Lovena Le M.D.   On: 08/29/2018 21:44        Scheduled Meds:  amiodarone  200 mg Oral Daily   apixaban  2.5 mg Oral BID   carvedilol  6.25 mg Oral BID WC  diltiazem  240 mg Oral Daily   docusate sodium  100 mg Oral BID   erythromycin  250 mg Oral TID   furosemide  40 mg Oral Daily   insulin aspart  0-5 Units Subcutaneous QHS   insulin aspart  0-9 Units Subcutaneous TID WC   mouth rinse  15 mL Mouth Rinse BID   potassium chloride  40 mEq Oral Once   rosuvastatin  2.5 mg Oral Q M,W,F   sertraline  50 mg Oral Daily   Continuous Infusions:  sodium chloride 250 mL (08/30/18 0621)   sodium chloride       LOS: 4 days    Time spent: 29 minutes spent on chart review, personally reviewed all imaging studies/labs, discussion with nursing staff, consultants, updating family and interview/physical exam; more than 50% of that time was spent in counseling and/or coordination of care.    Lane Eland J [place] DO [facility] Pager [phone]  If 7PM-7AM, please contact night-coverage [email or web address] Password [email or web address] 08/31/2018, 10:56 AM

## 2018-08-31 NOTE — TOC Initial Note (Signed)
Transition of Care [facility]) - Initial/Assessment Note    Patient Details  Name: Jo [name] MRN: [id number] Date of Birth: 1917/05/18  Transition of Care [facility]) CM/SW Contact:    Vinie Sill, [place] Phone Number: 08/31/2018, 3:30 PM  Clinical Narrative:                   Expected Discharge Plan: [place]    CSW received consult for discharge needs. CSW spoke with patient's daughter, [name], regarding PT recommendation of SNF placement at time of discharge. Patient's daughter explained she and her brother can no longer be able to provide the 24hr supervision needed for the patient. She informed the CSW, patient has been to [facility], and would like for her to go back there for ST rehab. Family is agreeable to SNF placement.  Family is realistic regarding therapy needs and expressed being hopeful for SNF placement. Patient expressed understanding of CSW role and discharge process as well as medical condition. No questions/concerns about plan or treatment at this time.   Thurmond Butts, MSW, [facility] Clinical Social Worker [phone]   Patient Goals and CMS Choice Patient states their goals for this hospitalization and ongoing recovery are:: ST rehab CMS Medicare.gov Compare Post Acute Care list provided to:: Patient Represenative (must comment)(choice provided by pateint's daughter) Choice offered to / list presented to : (patient's daughter selected Engineer, water)  Expected Discharge Plan and Services Expected Discharge Plan: [place] In-house Referral: Clinical Social Work     Living arrangements for the past 2 months: Single Family Home                                      Prior Living Arrangements/Services Living arrangements for the past 2 months: Single Family Home Lives with:: Self Patient language and need for interpreter reviewed:: Yes Do you feel safe going back to the place where you live?: No   famly wants the pateint to  go to SNF  Need for Family Participation in Patient Care: Yes (Comment) Care giver support system in place?: Yes (comment)   Criminal Activity/Legal Involvement Pertinent to Current Situation/Hospitalization: No - Comment as needed  Activities of Daily Living      Permission Sought/Granted Permission sought to share information with : Family Supports Permission granted to share information with : Yes, Verbal Permission Granted  Share Information with NAME: Chrystie Nose  Permission granted to share info w AGENCY: SNFs  Permission granted to share info w Relationship: daughter  Permission granted to share info w Contact Information: [phone]  Emotional Assessment Appearance:: Appears stated age Attitude/Demeanor/Rapport: Unable to Assess Affect (typically observed): Unable to Assess Orientation: : Oriented to Self, Oriented to Place, Oriented to  Time, Oriented to Situation Alcohol / Substance Use: Not Applicable Psych Involvement: No (comment)  Admission diagnosis:  Partial small bowel obstruction ([place]) [K56.600] Patient Active Problem List   Diagnosis Date Noted  . Partial small bowel obstruction ([place]) 08/27/2018  . Acute on chronic diastolic (congestive) heart failure ([place]) 06/24/2018  . Shortness of breath 06/23/2018  . Acute on chronic diastolic heart failure ([place]) 06/23/2018  . Atrial fibrillation with rapid ventricular response ([place]) 06/04/2017  . UTI (urinary tract infection) 08/30/2016  . Weakness 08/29/2016  . Acute lower UTI 08/29/2016  . Abdominal distention   . CAP (community acquired pneumonia) 06/27/2015  . Sepsis ([place]) 06/27/2015  . Pacemaker 06/27/2015  .  Moderate aortic insufficiency   . Chronic diastolic CHF (congestive heart failure) ([place])   . Breast cancer ([place]) 02/03/2014  . Dyslipidemia 08/15/2012  . Atrophic vaginitis 08/15/2012  . Constipation 08/15/2012  . Vitamin D intoxication 08/15/2012  . Acute on chronic diastolic HF (heart failure)  ([place]) 07/06/2012  . Atrial fibrillation with RVR ([place]) 07/21/2011  . Diabetes mellitus without complication ([place])   . Panic attacks   . Anxiety   . PAF (paroxysmal atrial fibrillation) ([place])   . SSS (sick sinus syndrome) ([place])   . History of aortic insufficiency   . Hypertension    PCP:  Crist Infante, MD Pharmacy:   [id number] - [place], [place] - [address] [place] [address] Phone: [phone] Fax: [phone]     Social Determinants of Health (SDOH) Interventions    Readmission Risk Interventions No flowsheet data found.

## 2018-08-31 NOTE — NC FL2 (Signed)
[place] LEVEL OF CARE SCREENING TOOL     IDENTIFICATION  Patient Name: Jo [name] Birthdate: 1917/03/01 Sex: female Admission Date (Current Location): 08/27/2018  [facility] and [place] Number:  Herbalist and Address:  [facility] [place], [place] [address] [place], [place] [address]      Provider Number: [id number]  Attending Physician Name and Address:  [place] Eric J, DO  Relative Name and Phone Number:  Chrystie Nose -cell [phone]  home- [phone]    Current Level of Care: Hospital Recommended Level of Care: [place] Prior Approval Number:    Date Approved/Denied:   PASRR Number: [id number]  Discharge Plan: SNF    Current Diagnoses: Patient Active Problem List   Diagnosis Date Noted  . Partial small bowel obstruction ([place]) 08/27/2018  . Acute on chronic diastolic (congestive) heart failure ([place]) 06/24/2018  . Shortness of breath 06/23/2018  . Acute on chronic diastolic heart failure ([place]) 06/23/2018  . Atrial fibrillation with rapid ventricular response ([place]) 06/04/2017  . UTI (urinary tract infection) 08/30/2016  . Weakness 08/29/2016  . Acute lower UTI 08/29/2016  . Abdominal distention   . CAP (community acquired pneumonia) 06/27/2015  . Sepsis ([place]) 06/27/2015  . Pacemaker 06/27/2015  . Moderate aortic insufficiency   . Chronic diastolic CHF (congestive heart failure) ([place])   . Breast cancer ([place]) 02/03/2014  . Dyslipidemia 08/15/2012  . Atrophic vaginitis 08/15/2012  . Constipation 08/15/2012  . Vitamin D intoxication 08/15/2012  . Acute on chronic diastolic HF (heart failure) ([place]) 07/06/2012  . Atrial fibrillation with RVR ([place]) 07/21/2011  . Diabetes mellitus without complication ([place])   . Panic attacks   . Anxiety   . PAF (paroxysmal atrial fibrillation) ([place])   . SSS (sick sinus syndrome) ([place])   . History of aortic insufficiency   . Hypertension     Orientation RESPIRATION  BLADDER Height & Weight        Normal External catheter, Incontinent Weight: 116 lb 13.5 oz (53 kg) Height:  5\' 2"  (157.5 cm)  BEHAVIORAL SYMPTOMS/MOOD NEUROLOGICAL BOWEL NUTRITION STATUS      Incontinent Diet(please see discharge summary)  AMBULATORY STATUS COMMUNICATION OF NEEDS Skin     Verbally Skin abrasions(MASD, elbow,right)                       Personal Care Assistance Level of Assistance  Bathing, Dressing, Feeding Bathing Assistance: Limited assistance Feeding assistance: Independent Dressing Assistance: Limited assistance     Functional Limitations Info  Sight, Hearing, Speech Sight Info: Impaired(wears glasses) Hearing Info: Adequate(very hard of hearing) Speech Info: Adequate    SPECIAL CARE FACTORS FREQUENCY  OT (By licensed OT), PT (By licensed PT)     PT Frequency: 3x per week OT Frequency: 3x per week            Contractures Contractures Info: Not present    Additional Factors Info  Code Status, Allergies, Psychotropic, Insulin Sliding Scale Code Status Info: DNR Allergies Info: Adhesive (tape),Sulfonamide Derivatives,Citalopram Hydrobromide Psychotropic Info: sertraline (ZOLOFT) tablet 50 mg ,LORazepam (ATIVAN) tablet 0.25 mg Insulin Sliding Scale Info: insulin aspart (novoLOG) injection 0-5 Units, daily at bedtime,insulin aspart (novoLOG) injection 0-9 Units ,3x daily w/meals       Current Medications (08/31/2018):  This is the current hospital active medication list Current Facility-Administered Medications  Medication Dose Route Frequency Provider Last Rate Last Dose  . 0.9 %  sodium chloride infusion   Intravenous  PRN [place] Eric J, DO 10 mL/hr at 08/30/18 7673 250 mL at 08/30/18 4193  . 0.9 %  sodium chloride infusion   Intravenous PRN [place] Donnamarie Poag, DO      . acetaminophen (TYLENOL) tablet 650 mg  650 mg Oral Q6H PRN Ivor Costa, MD   650 mg at 08/29/18 1723   Or  . acetaminophen (TYLENOL) suppository 650 mg  650 mg Rectal Q6H PRN  Ivor Costa, MD      . albuterol (PROVENTIL) (2.5 MG/3ML) 0.083% nebulizer solution 3 mL  3 mL Inhalation Q4H PRN Ivor Costa, MD      . amiodarone (PACERONE) tablet 200 mg  200 mg Oral Daily Ivor Costa, MD   200 mg at 08/31/18 1100  . apixaban (ELIQUIS) tablet 2.5 mg  2.5 mg Oral BID [place] Donnamarie Poag, DO   2.5 mg at 08/31/18 1101  . azelastine (ASTELIN) 0.1 % nasal spray 1 spray  1 spray Each Nare Daily PRN Ivor Costa, MD      . carvedilol (COREG) tablet 6.25 mg  6.25 mg Oral BID WC Ivor Costa, MD   6.25 mg at 08/31/18 1101  . diltiazem (CARDIZEM CD) 24 hr capsule 240 mg  240 mg Oral Daily Ivor Costa, MD   240 mg at 08/31/18 1100  . docusate sodium (COLACE) capsule 100 mg  100 mg Oral BID Jillyn Ledger, PA-C   100 mg at 08/31/18 1100  . erythromycin (E-MYCIN) tablet 250 mg  250 mg Oral TID [place] Eric J, DO   250 mg at 08/31/18 1101  . furosemide (LASIX) tablet 40 mg  40 mg Oral Daily Ivor Costa, MD   40 mg at 08/31/18 1100  . hydrALAZINE (APRESOLINE) injection 5 mg  5 mg Intravenous Q2H PRN Ivor Costa, MD      . insulin aspart (novoLOG) injection 0-5 Units  0-5 Units Subcutaneous QHS Ivor Costa, MD      . insulin aspart (novoLOG) injection 0-9 Units  0-9 Units Subcutaneous TID WC Ivor Costa, MD   2 Units at 08/31/18 1258  . LORazepam (ATIVAN) tablet 0.25 mg  0.25 mg Oral Q8H PRN Ivor Costa, MD      . MEDLINE mouth rinse  15 mL Mouth Rinse BID Ivor Costa, MD   15 mL at 08/31/18 1101  . morphine 2 MG/ML injection 0.5 mg  0.5 mg Intravenous Q4H PRN Ivor Costa, MD      . ondansetron [facility]) injection 4 mg  4 mg Intravenous Q8H PRN Ivor Costa, MD      . polyethylene glycol (MIRALAX / GLYCOLAX) packet 17 g  17 g Oral Daily PRN Ivor Costa, MD      . rosuvastatin (CRESTOR) tablet 2.5 mg  2.5 mg Oral Q M,W,F Ivor Costa, MD   2.5 mg at 08/31/18 1101  . sertraline (ZOLOFT) tablet 50 mg  50 mg Oral Daily Ivor Costa, MD   50 mg at 08/31/18 1100  . traMADol (ULTRAM) tablet 50 mg  50 mg Oral TID PRN Ivor Costa, MD         Discharge Medications: Please see discharge summary for a list of discharge medications.  Relevant Imaging Results:  Relevant Lab Results:   Additional Information SSN [id number]  Vinie Sill, [place]

## 2018-09-01 LAB — BASIC METABOLIC PANEL
Anion gap: 12 (ref 5–15)
BUN: 9 mg/dL (ref 8–23)
CO2: 32 mmol/L (ref 22–32)
Calcium: 9.3 mg/dL (ref 8.9–10.3)
Chloride: 94 mmol/L — ABNORMAL LOW (ref 98–111)
Creatinine, Ser: 0.76 mg/dL (ref 0.44–1.00)
GFR calc Af Amer: >60 mL/min (ref >60)
GFR calc non Af Amer: >60 mL/min (ref >60)
Glucose, Bld: 143 mg/dL — ABNORMAL HIGH (ref 70–99)
Potassium: 3.8 mmol/L (ref 3.5–5.1)
Sodium: 138 mmol/L (ref 135–145)

## 2018-09-01 LAB — GLUCOSE, CAPILLARY
Glucose-Capillary: 140 mg/dL — ABNORMAL HIGH (ref 70–99)
Glucose-Capillary: 131 mg/dL — ABNORMAL HIGH (ref 70–99)
Glucose-Capillary: 114 mg/dL — ABNORMAL HIGH (ref 70–99)
Glucose-Capillary: 152 mg/dL — ABNORMAL HIGH (ref 70–99)

## 2018-09-01 LAB — MAGNESIUM: Magnesium: 1.9 mg/dL (ref 1.7–2.4)

## 2018-09-01 MED ORDER — ERYTHROMYCIN BASE 250 MG PO TABS
250.0000 mg | ORAL_TABLET | Freq: Two times a day (BID) | ORAL | Status: DC
  Administered 2018-09-01 – 2018-09-02 (×3): 250 mg via ORAL
  Filled 2018-09-01 (×4): qty 1

## 2018-09-01 MED ORDER — ERYTHROMYCIN BASE 250 MG PO TABS: 250.0000 mg | ORAL_TABLET | Freq: Two times a day (BID) | ORAL | Status: DC

## 2018-09-01 NOTE — Progress Notes (Signed)
Patient's daughter at bedside.

## 2018-09-01 NOTE — Progress Notes (Signed)
Call to pharmacy to add AM dose back to E-Mycin order, timing of order change from TID to BID cancelled the previously scheduled 10am dose.

## 2018-09-01 NOTE — Progress Notes (Signed)
   Vital Signs MEWS/VS Documentation      09/01/2018 0534 09/01/2018 0846 09/01/2018 1100 09/01/2018 1157   MEWS Score:  0  0  0  0   MEWS Score Color:  Green  Green  Green  Green   Resp:  18  -  -  18   Pulse:  61  -  -  69   BP:  (!) 139/49  114/74  (!) 123/53  (!) 142/56   Temp:  98.1 F (36.7 C)  -  -  97.8 F (36.6 C)   O2 Device:  Nasal Cannula  -  -  Nasal Cannula   O2 Flow Rate (L/min):  -  -  -  3 L/min           Maud Deed Tobias-Diakun 09/01/2018,2:12 PM

## 2018-09-01 NOTE — Progress Notes (Signed)
Patient sleeping during shift report.      

## 2018-09-01 NOTE — Progress Notes (Signed)
PROGRESS NOTE    Jo [name]  MHD:[id number] DOB: 10-18-1917 DOA: 08/27/2018 PCP: Crist Infante, MD    Brief Narrative:   Jo [name] is a 85 y.o. female with medical history significant of hypertension, hyperlipidemia, diabetes mellitus, SSS, s/p of pacemaker placement, PAF on Eliquis, dCHF, anxiety, HOH, partial small bowel obstruction, who presents with abdominal distention and shortness of breath.  Per pt's daughter (I called her daughter by phone), patient has been having abdominal swelling and abdominal distention for several days.  Patient does not seem to seem to have abdominal pain or active vomiting per her daughter.  Last bowel movement was 2 days ago.  Her daughter states that they treated her with laxatives including MiraLAX and pt had some bowel movement. Pt also has shortness of breath, but no cough, fever, chills, chest pain.  Daughter states that patient has gained approximately 3 pounds last week.  Patient was recently hospitalized from 5/30-6/3 due to CHF exacerbation.  After she went home, she did not need oxygen, but today patient had oxygen desaturation to 88% on room air, and required 2 L nasal cannula oxygen at home per her daughter.  Patient has a generalized weakness, but no unilateral tenderness or numbness.  No facial droop or slurred speech no symptoms of UTI.  Patient seems to have right hip pain per her daughter.  ED Course: pt was found to have BNP 228, lipase 27, LFT normal, creatinine BUN normal, WBC 11.8, temperature normal, blood pressure 171/107, heart rate 70, oxygen saturation 94%.  Chest x-ray showed a bronchitic change with possible left base atelectasis. Pt is admitted to tele bed as inpt.  Assessment & Plan:   Principal Problem:   Partial small bowel obstruction (HCC) Active Problems:   PAF (paroxysmal atrial fibrillation) (HCC)   Hypertension   Diabetes mellitus without complication (HCC)   Anxiety   Dyslipidemia   Chronic diastolic CHF (congestive  heart failure) (HCC)   Possible partial small bowel obstruction vs constipation causing ileus: resolved Patient presenting with progressive abdominal distention.  Thought to be initially from volume overload by family members.  CT abdomen/pelvis shows multiple dilated small bowel loops consistent with early versus partial small bowel obstruction.  General surgery was consulted and followed during hospital course.  She underwent small bowel protocol with improvement of her abdominal distention and resumption of normalized bowel movements.   --Tolerating diet without nausea/vomiting --continue erythromycin 250 mg p.o. BID --Continue PT efforts  --Continue to monitor bowel movements  Hypokalemia: Hypomagnesemia: Potassium 3.8 today  PAF (paroxysmal atrial fibrillation) CHA2DS2-VASc Score is 6. Patient is on Eliquis at home. Heart rate is well controlled. --continue anticoagulation with Eliquis --continue amiodarone, Coreg, Cardizem  Essential hypertension: --Continue Coreg and Cardizem --IV hydralazine as needed  Diabetes mellitus without complication: Last Q1J 7.6. Patient is taking Januvia at home --Hold home medication regimen --Insulin sliding scale for coverage while inpatient  Anxiety: --Continue home ativan prn  Dyslipidemia: --Crestor  Chronic diastolic CHF (congestive heart failure) 2D echo on 06/05/2017 showed EF 65-70%.  BNP slightly elevated 228.  Patient has worsening shortness of breath.  She [place] have a mild CHF exacerbation. --will continue oral lasix 40 mg daily --Wean supplemental oxygen to maintain SPO2 greater than 92% --Daily weights, strict I's and O's  Adrenal mass:  US showed a right adrenal mass measuring up to 5.8 cm in size, similar dimensions 2 most recent CT. A previously identified left adrenal mass and enhancing lesion along the falciform ligament  are not well visualized. --Recommend follow-up outpatient with PCP, given her advanced age,  likely not optimal surgical candidate   DVT prophylaxis: SCDs, Eliquis Code Status: DNR Family Communication: none Disposition Plan: Continue inpatient hospitalization, pending discharge to SNF, social work for coordination   Consultants:   General surgery - signed off 8/6  Procedures:   None  Antimicrobials:   None   Subjective: Patient seen and examined at bedside, resting comfortably.  Continues to tolerating diet.  No other specific complaints or concerns at this time.  Denies headache, no fever/chills/night sweats, no nausea/vomiting/diarrhea, no chest pain, no palpitations, no abdominal pain, no weakness, no issues with bladder function, no paresthesias.  No acute events overnight per nursing staff.  Objective: Vitals:   08/31/18 0559 08/31/18 1346 08/31/18 2135 09/01/18 0534  BP: (!) 143/53 (!) 132/58 140/76 (!) 139/49  Pulse: 62 75 79 61  Resp: 20 20 18 18   Temp: 98.2 F (36.8 C) (!) 97.4 F (36.3 C) 98.4 F (36.9 C) 98.1 F (36.7 C)  TempSrc: Oral Oral Oral Oral  SpO2: 99% 99% 100% 100%  Weight:    48.8 kg  Height:        Intake/Output Summary (Last 24 hours) at 09/01/2018 0928 Last data filed at 09/01/2018 6789 Gross per 24 hour  Intake 886.06 ml  Output 850 ml  Net 36.06 ml   Filed Weights   08/29/18 0346 08/31/18 0249 09/01/18 0534  Weight: 53.5 kg 53 kg 48.8 kg    Examination:  General exam: Appears calm and comfortable  Respiratory system: Clear to auscultation. Respiratory effort normal.  On 3 L nasal cannula oxygenating 99%. Cardiovascular system: S1 & S2 heard, RRR. No JVD, murmurs, rubs, gallops or clicks. No pedal edema. Gastrointestinal system: Abdomen NTND, No organomegaly or masses felt.  Normal bowel sounds present. Central nervous system: Alert and oriented. No focal neurological deficits. Extremities: Symmetric 5 x 5 power. Skin: No rashes, lesions or ulcers Psychiatry: Judgement and insight appear normal. Mood & affect appropriate.      Data Reviewed: I have personally reviewed following labs and imaging studies  CBC: Recent Labs  Lab 08/27/18 1807 08/28/18 0025  WBC 11.8* 12.4*  HGB 12.6 12.3  HCT 38.3 36.6  MCV 100.5* 97.9  PLT 219 381   Basic Metabolic Panel: Recent Labs  Lab 08/29/18 0640 08/30/18 0454 08/30/18 1233 08/31/18 0549 09/01/18 0323  NA 139 137 133* 135 138  K 3.1* 3.2* 3.7 3.5 3.8  CL 97* 99 97* 96* 94*  CO2 25 27 29 27  32  GLUCOSE 132* 140* 218* 142* 143*  BUN 11 11 7* 5* 9  CREATININE 0.92 0.70 0.67 0.71 0.76  CALCIUM 8.7* 8.3* 8.3* 8.6* 9.3  MG 1.8 1.6*  --  2.1 1.9   GFR: Estimated Creatinine Clearance: 29.5 mL/min (by C-G formula based on SCr of 0.76 mg/dL). Liver Function Tests: Recent Labs  Lab 08/27/18 1807  AST 19  ALT 17  ALKPHOS 75  BILITOT 0.8  PROT 6.3*  ALBUMIN 3.6   Recent Labs  Lab 08/27/18 1807  LIPASE 27   No results for input(s): AMMONIA in the last 168 hours. Coagulation Profile: Recent Labs  Lab 08/27/18 1807  INR 1.4*   Cardiac Enzymes: No results for input(s): CKTOTAL, CKMB, CKMBINDEX, TROPONINI in the last 168 hours. BNP (last 3 results) No results for input(s): PROBNP in the last 8760 hours. HbA1C: No results for input(s): HGBA1C in the last 72 hours. CBG: Recent Labs  Lab 08/31/18 0650 08/31/18 1154 08/31/18 1646 08/31/18 2134 09/01/18 0638  GLUCAP 149* 141* 116* 131* 140*   Lipid Profile: No results for input(s): CHOL, HDL, LDLCALC, TRIG, CHOLHDL, LDLDIRECT in the last 72 hours. Thyroid Function Tests: No results for input(s): TSH, T4TOTAL, FREET4, T3FREE, THYROIDAB in the last 72 hours. Anemia Panel: No results for input(s): VITAMINB12, FOLATE, FERRITIN, TIBC, IRON, RETICCTPCT in the last 72 hours. Sepsis Labs: No results for input(s): PROCALCITON, LATICACIDVEN in the last 168 hours.  Recent Results (from the past 240 hour(s))  SARS CORONAVIRUS 2 Nasal Swab Aptima Multi Swab     Status: None   Collection Time:  08/27/18  8:51 PM   Specimen: Aptima Multi Swab; Nasal Swab  Result Value Ref Range Status   SARS Coronavirus 2 NEGATIVE NEGATIVE Final    Comment: (NOTE) SARS-CoV-2 target nucleic acids are NOT DETECTED. The SARS-CoV-2 RNA is generally detectable in upper and lower respiratory specimens during the acute phase of infection. Negative results do not preclude SARS-CoV-2 infection, do not rule out co-infections with other pathogens, and should not be used as the sole basis for treatment or other patient management decisions. Negative results must be combined with clinical observations, patient history, and epidemiological information. The expected result is Negative. Fact Sheet for Patients: SugarRoll.be Fact Sheet for Healthcare Providers: https://www.woods-mathews.com/ This test is not yet approved or cleared by the [place] FDA and  has been authorized for detection and/or diagnosis of SARS-CoV-2 by FDA under an Emergency Use Authorization (EUA). This EUA will remain  in effect (meaning this test can be used) for the duration of the COVID-19 declaration under Section 56 4(b)(1) of the Act, 21 U.S.C. section 360bbb-3(b)(1), unless the authorization is terminated or revoked sooner. Performed at [facility], [place] [address] [place], [place] [address]          Radiology Studies: Dg Abd Portable 1v-small Bowel Obstruction Protocol-24 Hr Delay  Result Date: 08/30/2018 CLINICAL DATA:  Evaluate 8 hours after contrast.  Pain. EXAM: PORTABLE ABDOMEN - 1 VIEW COMPARISON:  August 29, 2018 FINDINGS: Contrast has traversed the small bowel and is now located in the colon. Small bowel loops are prominent but less dilated in the interval. IMPRESSION: Decreasing obstruction or ileus. Contrast is now seen in the colon and the rectum. Electronically Signed   By: Dorise Bullion III M.D   On: 08/30/2018 13:40        Scheduled Meds: .  amiodarone  200 mg Oral Daily  . apixaban  2.5 mg Oral BID  . carvedilol  6.25 mg Oral BID WC  . diltiazem  240 mg Oral Daily  . docusate sodium  100 mg Oral BID  . erythromycin  250 mg Oral BID WC  . furosemide  40 mg Oral Daily  . insulin aspart  0-5 Units Subcutaneous QHS  . insulin aspart  0-9 Units Subcutaneous TID WC  . mouth rinse  15 mL Mouth Rinse BID  . rosuvastatin  2.5 mg Oral Q M,W,F  . sertraline  50 mg Oral Daily   Continuous Infusions: . sodium chloride 250 mL (08/30/18 0621)  . sodium chloride       LOS: 5 days    Time spent: 29 minutes spent on chart review, personally reviewed all imaging studies/labs, discussion with nursing staff, consultants, updating family and interview/physical exam; more than 50% of that time was spent in counseling and/or coordination of care.    Oluwaferanmi Wain J [place] DO [facility] Pager  [phone]  If 7PM-7AM, please contact night-coverage [email or web address] Password [facility] 09/01/2018, 9:28 AM

## 2018-09-01 NOTE — TOC Progression Note (Signed)
Transition of Care [facility]) - Progression Note    Patient Details  Name: Nyaisha Simao Fackrell MRN: [id number] Date of Birth: 1917/02/02  Transition of [facility] CM/SW [place], [place] Phone Number: [phone] 09/01/2018, 2:01 PM  Clinical Narrative:     CSW spoke with patient's daughter  [name] at hospital to give bed offers.[name] chose [facility] due to patient being at that faciiltiy in the past.   CSW reached out to [place] at [facility] and let her know that patient;s daughter had chosen facility and that patient should be medically stable for discharge in a couple days which was reported by Therapist, sports. Olivia Mackie stated that they should have a bed open for patient.  CSW followed up with [name] to inform her that [facility] stated that they will most likely have a bed.   CSW will continue to follow for discharge planning.  [place] Clinical Social Worker [phone]   Expected Discharge Plan: [place]    Expected Discharge Plan and Services Expected Discharge Plan: [place] In-house Referral: Clinical Social Work     Living arrangements for the past 2 months: Single Family Home                                       Social Determinants of Health (SDOH) Interventions    Readmission Risk Interventions No flowsheet data found.

## 2018-09-01 NOTE — Progress Notes (Signed)
Patient with loose BM/accident in bed.

## 2018-09-02 DIAGNOSIS — E119 Type 2 diabetes mellitus without complications: Secondary | ICD-10-CM | POA: Diagnosis not present

## 2018-09-02 DIAGNOSIS — Z20828 Contact with and (suspected) exposure to other viral communicable diseases: Secondary | ICD-10-CM | POA: Diagnosis not present

## 2018-09-02 DIAGNOSIS — K56609 Unspecified intestinal obstruction, unspecified as to partial versus complete obstruction: Secondary | ICD-10-CM | POA: Diagnosis not present

## 2018-09-02 DIAGNOSIS — R2689 Other abnormalities of gait and mobility: Secondary | ICD-10-CM | POA: Diagnosis not present

## 2018-09-02 DIAGNOSIS — I110 Hypertensive heart disease with heart failure: Secondary | ICD-10-CM | POA: Diagnosis not present

## 2018-09-02 DIAGNOSIS — J189 Pneumonia, unspecified organism: Secondary | ICD-10-CM | POA: Diagnosis not present

## 2018-09-02 DIAGNOSIS — E876 Hypokalemia: Secondary | ICD-10-CM | POA: Diagnosis not present

## 2018-09-02 DIAGNOSIS — Z7189 Other specified counseling: Secondary | ICD-10-CM | POA: Diagnosis not present

## 2018-09-02 DIAGNOSIS — E46 Unspecified protein-calorie malnutrition: Secondary | ICD-10-CM | POA: Diagnosis not present

## 2018-09-02 DIAGNOSIS — R52 Pain, unspecified: Secondary | ICD-10-CM | POA: Diagnosis not present

## 2018-09-02 DIAGNOSIS — D72829 Elevated white blood cell count, unspecified: Secondary | ICD-10-CM | POA: Diagnosis not present

## 2018-09-02 DIAGNOSIS — Z515 Encounter for palliative care: Secondary | ICD-10-CM | POA: Diagnosis not present

## 2018-09-02 DIAGNOSIS — R41841 Cognitive communication deficit: Secondary | ICD-10-CM | POA: Diagnosis not present

## 2018-09-02 DIAGNOSIS — E785 Hyperlipidemia, unspecified: Secondary | ICD-10-CM | POA: Diagnosis not present

## 2018-09-02 DIAGNOSIS — K5900 Constipation, unspecified: Secondary | ICD-10-CM | POA: Diagnosis not present

## 2018-09-02 DIAGNOSIS — Z66 Do not resuscitate: Secondary | ICD-10-CM | POA: Diagnosis not present

## 2018-09-02 DIAGNOSIS — Z7401 Bed confinement status: Secondary | ICD-10-CM | POA: Diagnosis not present

## 2018-09-02 DIAGNOSIS — F419 Anxiety disorder, unspecified: Secondary | ICD-10-CM | POA: Diagnosis not present

## 2018-09-02 DIAGNOSIS — I10 Essential (primary) hypertension: Secondary | ICD-10-CM | POA: Diagnosis not present

## 2018-09-02 DIAGNOSIS — I5032 Chronic diastolic (congestive) heart failure: Secondary | ICD-10-CM | POA: Diagnosis not present

## 2018-09-02 DIAGNOSIS — K567 Ileus, unspecified: Secondary | ICD-10-CM | POA: Diagnosis not present

## 2018-09-02 DIAGNOSIS — M2550 Pain in unspecified joint: Secondary | ICD-10-CM | POA: Diagnosis not present

## 2018-09-02 DIAGNOSIS — E278 Other specified disorders of adrenal gland: Secondary | ICD-10-CM | POA: Diagnosis not present

## 2018-09-02 DIAGNOSIS — R0902 Hypoxemia: Secondary | ICD-10-CM | POA: Diagnosis not present

## 2018-09-02 DIAGNOSIS — R54 Age-related physical debility: Secondary | ICD-10-CM | POA: Diagnosis not present

## 2018-09-02 DIAGNOSIS — I480 Paroxysmal atrial fibrillation: Secondary | ICD-10-CM | POA: Diagnosis not present

## 2018-09-02 DIAGNOSIS — M6281 Muscle weakness (generalized): Secondary | ICD-10-CM | POA: Diagnosis not present

## 2018-09-02 DIAGNOSIS — S32020D Wedge compression fracture of second lumbar vertebra, subsequent encounter for fracture with routine healing: Secondary | ICD-10-CM | POA: Diagnosis not present

## 2018-09-02 DIAGNOSIS — R7989 Other specified abnormal findings of blood chemistry: Secondary | ICD-10-CM | POA: Diagnosis not present

## 2018-09-02 DIAGNOSIS — R1900 Intra-abdominal and pelvic swelling, mass and lump, unspecified site: Secondary | ICD-10-CM | POA: Diagnosis not present

## 2018-09-02 DIAGNOSIS — R1084 Generalized abdominal pain: Secondary | ICD-10-CM | POA: Diagnosis not present

## 2018-09-02 DIAGNOSIS — Z950 Presence of cardiac pacemaker: Secondary | ICD-10-CM | POA: Diagnosis not present

## 2018-09-02 DIAGNOSIS — Z79899 Other long term (current) drug therapy: Secondary | ICD-10-CM | POA: Diagnosis not present

## 2018-09-02 DIAGNOSIS — K56600 Partial intestinal obstruction, unspecified as to cause: Secondary | ICD-10-CM | POA: Diagnosis not present

## 2018-09-02 DIAGNOSIS — D649 Anemia, unspecified: Secondary | ICD-10-CM | POA: Diagnosis not present

## 2018-09-02 LAB — GLUCOSE, CAPILLARY
Glucose-Capillary: 133 mg/dL — ABNORMAL HIGH (ref 70–99)
Glucose-Capillary: 134 mg/dL — ABNORMAL HIGH (ref 70–99)

## 2018-09-02 LAB — SARS CORONAVIRUS 2 (TAT 6-24 HRS): SARS Coronavirus 2: NEGATIVE

## 2018-09-02 MED ORDER — ERYTHROMYCIN BASE 250 MG PO TABS: 250.0000 mg | ORAL_TABLET | Freq: Two times a day (BID) | ORAL | 0 refills | Status: AC

## 2018-09-02 MED ORDER — TRAMADOL HCL 50 MG PO TABS: 50.0000 mg | ORAL_TABLET | Freq: Three times a day (TID) | ORAL | 0 refills | Status: AC | PRN

## 2018-09-02 MED ORDER — AZELASTINE HCL 0.1 % NA SOLN: 1.0000 | Freq: Every day | NASAL | 12 refills | Status: AC | PRN

## 2018-09-02 MED ORDER — AMIODARONE HCL 200 MG PO TABS: 200.0000 mg | ORAL_TABLET | Freq: Every day | ORAL | 0 refills | Status: AC

## 2018-09-02 MED ORDER — JANUVIA 100 MG PO TABS: 100.0000 mg | ORAL_TABLET | Freq: Every day | ORAL | 0 refills | Status: AC

## 2018-09-02 MED ORDER — DILTIAZEM HCL ER COATED BEADS 240 MG PO CP24: 240.0000 mg | ORAL_CAPSULE | Freq: Every day | ORAL | 0 refills | Status: AC

## 2018-09-02 MED ORDER — POTASSIUM CHLORIDE CRYS ER 20 MEQ PO TBCR: 20.0000 meq | EXTENDED_RELEASE_TABLET | Freq: Every day | ORAL | 0 refills | Status: AC | PRN

## 2018-09-02 MED ORDER — LORAZEPAM 0.5 MG PO TABS: 0.2500 mg | ORAL_TABLET | ORAL | 0 refills | Status: AC

## 2018-09-02 MED ORDER — ESTRADIOL 0.1 MG/GM VA CREA: 1.0000 | TOPICAL_CREAM | VAGINAL | 12 refills | Status: AC

## 2018-09-02 MED ORDER — POLYETHYLENE GLYCOL 3350 17 G PO PACK: 17.0000 g | PACK | Freq: Two times a day (BID) | ORAL | 0 refills | Status: AC | PRN

## 2018-09-02 MED ORDER — CRESTOR 5 MG PO TABS: 2.5000 mg | ORAL_TABLET | ORAL | 0 refills | Status: AC

## 2018-09-02 MED ORDER — FUROSEMIDE 40 MG PO TABS: 20.0000 mg | ORAL_TABLET | Freq: Every day | ORAL | 0 refills | Status: AC | PRN

## 2018-09-02 MED ORDER — CARVEDILOL 6.25 MG PO TABS: 6.2500 mg | ORAL_TABLET | Freq: Two times a day (BID) | ORAL | 0 refills | Status: AC

## 2018-09-02 MED ORDER — VITAMIN D (ERGOCALCIFEROL) 1.25 MG (50000 UNIT) PO CAPS: 50000.0000 [IU] | ORAL_CAPSULE | ORAL | 0 refills | Status: AC

## 2018-09-02 MED ORDER — SERTRALINE HCL 50 MG PO TABS: 50.0000 mg | ORAL_TABLET | Freq: Every day | ORAL | 0 refills | Status: AC

## 2018-09-02 MED ORDER — APIXABAN 2.5 MG PO TABS: 2.5000 mg | ORAL_TABLET | Freq: Two times a day (BID) | ORAL | 0 refills | Status: AC

## 2018-09-02 NOTE — Progress Notes (Signed)
Son notified of PTAR here to pick up patient.  Call placed to [facility] to give report on patient.

## 2018-09-02 NOTE — Progress Notes (Signed)
Patient sleeping during shift report.      

## 2018-09-02 NOTE — Plan of Care (Signed)

## 2018-09-02 NOTE — Discharge Summary (Signed)
Physician Discharge Summary  Kiyani Jernigan Kochel BPZ:[id number] DOB: [birth year] DOA: 08/27/2018  PCP: Crist Infante, MD  Admit date: 08/27/2018 Discharge date: 09/02/2018  Admitted From: Home Disposition:  [name]  Recommendations for Outpatient Follow-up:  1. Follow up with physician at facility within 1 week following discharge 2. Newly started on erythromycin 250 mg p.o. twice daily with meals for gastroparesis, slow transit constipation  Home Health: No Equipment/Devices: Oxygen 3LNC  Discharge Condition: Stable CODE STATUS: DNR Diet recommendation: Heart Healthy / Carb Modified   History of present illness:  Jo [name] a 85 y.o.femalewith medical history significant ofhypertension, hyperlipidemia, diabetes mellitus,SSS, s/p ofpacemaker placement,PAFon Eliquis, dCHF, anxiety,HOH,partial small bowel obstruction, who presents with abdominal distention and shortness of breath.  Per pt'sdaughter (I called her daughter by phone),patient has been having abdominal swellingandabdominal distention for several days.Patient does not seem to seem to haveabdominal painoractive vomitingper her daughter.Last bowel movement was 2 days ago. Her daughter states that they treated her with laxatives including MiraLAX and pt hadsome bowel movement. Pt also hasshortness of breath, but no cough, fever, chills, chest pain. Daughter states that patient has gained approximately 3 pounds last week. Patient was recently hospitalized from 5/30-6/3 due to CHFexacerbation. After she went home, she did not need oxygen, but today patient hadoxygen desaturation to 88% on room air, and required 2 L nasal cannula oxygen at home perher daughter. Patient has a generalized weakness, but no unilateral tenderness or numbness. No facial droop or slurred speech no symptoms of UTI. Patient seems to have right hip pain per her daughter.  ED Course:pt was found to have BNP 228,lipase 27, LFT  normal,creatinine BUN normal, WBC 11.8, temperature normal, blood pressure 171/107, heart rate 70, oxygen saturation 94%.Chest x-ray showed a bronchitic change with possible left base atelectasis. Pt is admitted to tele bed as inpt.  Hospital course:  Possible partial small bowel obstruction vs constipation causing ileus: resolved Patient presenting with progressive abdominal distention.  Thought to be initially from volume overload by family members.  CT abdomen/pelvis shows multiple dilated small bowel loops consistent with early versus partial small bowel obstruction.  General surgery was consulted and followed during hospital course.  She underwent small bowel protocol with improvement of her abdominal distention and resumption of normalized bowel movements.    Continue erythromycin stearate from the milligrams p.o. twice daily with meals.  Continue to monitor bowel movements outpatient and utilize parallax as needed.  PAF (paroxysmal atrial fibrillation) CHA2DS2-VASc Scoreis 6. Heart rate is well controlled. Continue anticoagulation with Eliquis. Continueamiodarone, Coreg, Cardizem.  Essential hypertension: Continue Coreg and Cardizem  Diabetes mellitus without complication: Last E4M3.5. ContinueJanuvia.  Anxiety: Continue homeativan  Dyslipidemia: Continue Crestor  Chronic diastolic CHF (congestive heart failure) 2D echo on 06/05/2017 showed EF 65-70%. BNP slightly elevated 228. Patient has worsening shortness of breath.She Anes have a mild CHF exacerbation. Continue Lasix prn and supplemental oxygen. Recommend to continue to monitor daily weights. Low Na Diet.  Adrenal mass:  US showed a right adrenal mass measuring up to 5.8 cm in size, similar dimensions 2 most recent CT. A previously identified left adrenal mass and enhancing lesion along the falciform ligament are not well visualized. Recommend follow-up outpatient with PCP, given her advanced age, likely not  optimal surgical candidate  Discharge Diagnoses:  Active Problems:   PAF (paroxysmal atrial fibrillation) (HCC)   Hypertension   Diabetes mellitus without complication (HCC)   Anxiety   Dyslipidemia   Chronic diastolic CHF (congestive heart failure) (  [facility])    Discharge Instructions  Discharge Instructions    Diet - low sodium heart healthy   Complete by: As directed    Increase activity slowly   Complete by: As directed      Allergies as of 09/02/2018      Reactions   Adhesive [tape] Other (See Comments)   PLEASE USE COBAN WRAP INSTEAD OF TAPE- skin will TEAR EASILY!!!!   Sulfonamide Derivatives Rash   "it was all over my legs"   Citalopram Hydrobromide Other (See Comments)   Pt does not remember reaction      Medication List    TAKE these medications   acetaminophen 500 MG tablet Commonly known as: TYLENOL Take 500-1,000 mg by mouth every 6 (six) hours as needed for mild pain or headache.   amiodarone 200 MG tablet Commonly known as: PACERONE Take 1 tablet (200 mg total) by mouth daily.   apixaban 2.5 MG Tabs tablet Commonly known as: Eliquis Take 1 tablet (2.5 mg total) by mouth 2 (two) times daily.   azelastine 0.1 % nasal spray Commonly known as: ASTELIN Place 1 spray into both nostrils daily as needed for rhinitis or allergies.   carvedilol 6.25 MG tablet Commonly known as: COREG Take 1 tablet (6.25 mg total) by mouth 2 (two) times daily with a meal. Hold for SBP<110 OR HR <60   Crestor 5 MG tablet Generic drug: rosuvastatin Take 0.5 tablets (2.5 mg total) by mouth every Monday, Wednesday, and Friday. Start taking on: September 03, 2018   diltiazem 240 MG 24 hr capsule Commonly known as: CARDIZEM CD Take 1 capsule (240 mg total) by mouth daily.   erythromycin 250 MG tablet Commonly known as: E-MYCIN Take 1 tablet (250 mg total) by mouth 2 (two) times daily with a meal.   estradiol 0.1 MG/GM vaginal cream Commonly known as: ESTRACE Place 1  Applicatorful vaginally every Wednesday. Start taking on: September 05, 2018   feeding supplement (ENSURE ENLIVE) Liqd Take 237 mLs by mouth 2 (two) times daily between meals. What changed:   when to take this  reasons to take this   furosemide 40 MG tablet Commonly known as: LASIX Take 0.5 tablets (20 mg total) by mouth daily as needed for fluid or edema.   Januvia 100 MG tablet Generic drug: sitaGLIPtin Take 1 tablet (100 mg total) by mouth daily.   LORazepam 0.5 MG tablet Commonly known as: ATIVAN Take 0.5-1 tablets (0.25-0.5 mg total) by mouth See admin instructions. Take 0.25 mg tablet in the morning and afternoon, then 0.5 mg at bedtime What changed: additional instructions   polyethylene glycol 17 g packet Commonly known as: MIRALAX / GLYCOLAX Take 17 g by mouth 2 (two) times daily as needed for mild constipation.   potassium chloride SA 20 MEQ tablet Commonly known as: K-DUR Take 1 tablet (20 mEq total) by mouth daily as needed (only if taking Lasix).   sertraline 50 MG tablet Commonly known as: ZOLOFT Take 1 tablet (50 mg total) by mouth daily.   traMADol 50 MG tablet Commonly known as: ULTRAM Take 1 tablet (50 mg total) by mouth 3 (three) times daily as needed for moderate pain (or hip pain).   Vitamin D (Ergocalciferol) 1.25 MG (50000 UT) Caps capsule Commonly known as: DRISDOL Take 1 capsule (50,000 Units total) by mouth every Wednesday. Start taking on: September 05, 2018      Contact information for after-discharge care    Destination    [facility] .   Service: Skilled Nursing Contact information: [address] [place] [phone]             Allergies  Allergen Reactions  . Adhesive [Tape] Other (See Comments)    PLEASE USE COBAN WRAP INSTEAD OF TAPE- skin will TEAR EASILY!!!!  . Sulfonamide Derivatives Rash    "it was all over my legs"  . Citalopram Hydrobromide Other (See Comments)     Pt does not remember reaction    Consultations:  General Surgery   Procedures/Studies: US Abdomen Complete  Result Date: 08/27/2018 CLINICAL DATA:  Abdominal swelling EXAM: ABDOMEN ULTRASOUND COMPLETE COMPARISON:  CT abdomen pelvis July 13, 2018 FINDINGS: Technically difficult exam due to extensive bowel gas which obscures portions of the abdomen is noted low. Gallbladder: Multiple echogenic, post of the shadowing gallstones are present. Largest measures up to 1.9 cm in size. No wall thickening visualized. No sonographic Murphy sign noted by sonographer. Common bile duct: Diameter: 5.5 mm Liver: Portions of the right lobe liver obscured by bowel gas. An enhancing lesion along the false form ligament is not well visualized on this study. Within normal limits in parenchymal echogenicity. Portal vein is patent on color Doppler imaging with normal direction of blood flow towards the liver. IVC: Not well visualized due to bowel gas. Pancreas: Not visualized due to bowel gas. Spleen: Size (4.4 cm) and appearance within normal limits. Right Kidney: Length: 9.7. Echogenicity within normal limits. No mass or hydronephrosis visualized. Left Kidney: Length: 8.3 cm. Echogenicity within normal limits. No mass or hydronephrosis visualized. Lobular contours Gathers reflect fetal lobulation versus scarring. Abdominal aorta: No aneurysm visualized. Other findings: Trace right pleural effusion. Right adrenal mass measuring approximately 5.8 x 3.6 x 4.9 cm. IMPRESSION: Technically difficult exam due to bowel gas. Right adrenal mass measuring up to 5.8 cm in size, similar dimensions 2 most recent CT. A previously identified left adrenal mass and enhancing lesion along the falciform ligament are not well visualized. Cholelithiasis without evidence of acute cholecystitis. Lobular appearance of left kidney Knoch reflect fetal lobulation versus scarring. Electronically Signed   By: Lovena Le M.D.   On: 08/27/2018 18:45   Ct  Abdomen Pelvis W Contrast  Result Date: 08/27/2018 CLINICAL DATA:  Abdominal distension EXAM: CT ABDOMEN AND PELVIS WITH CONTRAST TECHNIQUE: Multidetector CT imaging of the abdomen and pelvis was performed using the standard protocol following bolus administration of intravenous contrast. CONTRAST:  149mL OMNIPAQUE IOHEXOL 300 MG/ML  SOLN COMPARISON:  July 13, 2018 FINDINGS: Lower chest: Small left pleural effusion is identified. Mild atelectasis of posterior left lung base is noted. The heart size is enlarged. Hepatobiliary: There is stable intra and extrahepatic biliary ductal dilatation. The gallbladder is stable. There is a 2.4 cm enhancing lesion near the falciform ligament unchanged compared prior exam. No other focal liver lesion is identified. Pancreas: Unremarkable. No pancreatic ductal dilatation or surrounding inflammatory changes. Spleen: Normal in size without focal abnormality. Adrenals/Urinary Tract: Stable 5.5 cm fatty right adrenal lesion is identified consistent with myelolipoma. This is unchanged. 3.1 cm left adrenal mass is unchanged compared prior exam. There is atrophy of the left kidney. There is no hydronephrosis bilaterally. The bladder is partially decompressed without gross abnormality. Stomach/Bowel: There are multiple dilated small bowel loops throughout the abdomen and pelvis without clear transition. The left colon and sigmoid colon are decompressed. There is stool and air identified in the right colon and rectal sigmoid colon. The appendix not definitely seen but  no inflammation is noted around the cecum. There is a small hiatal hernia. Vascular/Lymphatic: Aortic atherosclerosis. No enlarged abdominal or pelvic lymph nodes. Reproductive: Uterus and bilateral adnexa are unremarkable. Other: None. Musculoskeletal: There are degenerative joint changes the spine. There is further loss of height of compression fracture at L2 compared to prior exam of July 13, 2018, superimposed acute  fracture is not excluded. IMPRESSION: Multiple dilated small bowel loops in the abdomen and pelvis. There is air and stool identified in the colon. The findings [place] be due to early or partial small bowel obstruction. Follow-up is recommended. Further loss of vertebral body height with compression fracture at L2 compared to prior exam of July 13, 2018, superimposed acute fracture is not excluded. Electronically Signed   By: Abelardo Diesel M.D.   On: 08/27/2018 20:14   Dg Chest Port 1 View  Result Date: 08/29/2018 CLINICAL DATA:  Shoulder pain, shortness of breath EXAM: PORTABLE CHEST 1 VIEW COMPARISON:  08/27/2018 FINDINGS: Unchanged AP portable examination without acute abnormality of the lungs. Mild cardiomegaly with left chest multi lead pacer. IMPRESSION: Unchanged AP portable examination without acute abnormality of the lungs. Mild cardiomegaly with left chest multi lead pacer. Electronically Signed   By: Eddie Candle M.D.   On: 08/29/2018 09:03   Dg Chest Portable 1 View  Result Date: 08/27/2018 CLINICAL DATA:  Shortness of breath, abdominal swelling EXAM: PORTABLE CHEST 1 VIEW COMPARISON:  Portable exam 1733 hours compared to 07/13/2018 FINDINGS: Left subclavian sequential transvenous pacemaker leads project at right atrium and right ventricle. Minimal enlargement of cardiac silhouette. Mediastinal contours and pulmonary vascularity normal. Atherosclerotic calcification aorta. Bronchitic changes with subsegmental atelectasis LEFT base. Lungs otherwise clear. No pleural effusion or pneumothorax. Bones demineralized. IMPRESSION: Bronchitic changes with subsegmental atelectasis at LEFT base. Electronically Signed   By: Lavonia Dana M.D.   On: 08/27/2018 17:56   Dg Abd Portable 1v-small Bowel Obstruction Protocol-24 Hr Delay  Result Date: 08/30/2018 CLINICAL DATA:  Evaluate 8 hours after contrast.  Pain. EXAM: PORTABLE ABDOMEN - 1 VIEW COMPARISON:  August 29, 2018 FINDINGS: Contrast has traversed the small  bowel and is now located in the colon. Small bowel loops are prominent but less dilated in the interval. IMPRESSION: Decreasing obstruction or ileus. Contrast is now seen in the colon and the rectum. Electronically Signed   By: Dorise Bullion III M.D   On: 08/30/2018 13:40   Dg Abd Portable 1v-small Bowel Obstruction Protocol-initial, 8 Hr Delay  Result Date: 08/29/2018 CLINICAL DATA:  Abdominal distension EXAM: PORTABLE ABDOMEN - 1 VIEW COMPARISON:  Same day radiographs FINDINGS: Redemonstration of the persistently dilated loops of small bowel throughout the abdomen several of the loops now appear to contain radiodense contrast medium. There is no passage of contrast to the level of the colon. Air and stool is present over several of the colonic loops. Other findings are not significantly changed from prior. IMPRESSION: Persistently dilated loops of small bowel concerning for obstruction or ileus. Passage of contrast material into the small bowel. No contrast material visualized within the colon. Electronically Signed   By: Lovena Le M.D.   On: 08/29/2018 21:44   Dg Abd Portable 1v  Result Date: 08/29/2018 CLINICAL DATA:  Abdominal distention. EXAM: PORTABLE ABDOMEN - 1 VIEW COMPARISON:  Abdomen 08/28/2018.  CT 08/27/2018. FINDINGS: Persistent dilated loops of small bowel are again noted without interim change. Continued follow-up exam to demonstrate resolution suggested. Scratch stool noted throughout the colon. No free air. Stable degenerative changes  and compression fractures lumbar spine. Degenerative changes both hips. Cardiac pacer wires noted over the heart. IMPRESSION: Persistent dilated loops of small bowel are again noted without interim change. Again small-bowel obstruction cannot be excluded. Continued follow-up exams to demonstrate resolution suggested. Stool again noted throughout the colon. No free air. Electronically Signed   By: Marcello Moores  Register   On: 08/29/2018 08:36   Dg Abd Portable  1v  Result Date: 08/28/2018 CLINICAL DATA:  Abdominal distension, history CHF, hypertension, kidney stones EXAM: PORTABLE ABDOMEN - 1 VIEW COMPARISON:  CT abdomen pelvis 08/27/2018 FINDINGS: Excreted contrast material within bladder, which demonstrates trabeculation. Gaseous distention of small bowel loops. Prominent stool RIGHT colon. Scattered gas throughout remainder of colon. Degree of small bowel distension could either represent small bowel ileus or a component of small bowel obstruction, despite gas and stool in colon. No definite bowel wall thickening. Bones demineralized with degenerative disc/facet disease changes of the lumbar spine and old appearing L2 compression fracture. IMPRESSION: Gaseous distention of small bowel loops though prominent stool is present in the RIGHT colon and gas is seen throughout the remainder of the colon, either representing small-bowel ileus or obstruction. Trabeculated bladder. Osseous demineralization with L2 compression fracture Electronically Signed   By: Lavonia Dana M.D.   On: 08/28/2018 10:06    (Echo, Carotid, EGD, Colonoscopy, ERCP)    Subjective:   Discharge Exam: Vitals:   09/01/18 2051 09/02/18 0424  BP:  (!) 113/52  Pulse: 81 72  Resp:  18  Temp:  98.2 F (36.8 C)  SpO2: 95% 97%   Vitals:   09/01/18 1711 09/01/18 2022 09/01/18 2051 09/02/18 0424  BP:  134/65  (!) 113/52  Pulse:  (!) 138 81 72  Resp:  18  18  Temp:  97.7 F (36.5 C)  98.2 F (36.8 C)  TempSrc:  Oral  Oral  SpO2: 94% 92% 95% 97%  Weight:    47.8 kg  Height:        General: Pt is alert, awake, not in acute distress, hard of hearing Cardiovascular: RRR, S1/S2 +, no rubs, no gallops Respiratory: CTA bilaterally, no wheezing, no rhonchi, normal respiratory effort, on 3L [place] w/ SpO2 97% Abdominal: Soft, NT, ND, bowel sounds + Extremities: no edema, no cyanosis    The results of significant diagnostics from this hospitalization (including imaging, microbiology,  ancillary and laboratory) are listed below for reference.     Microbiology: Recent Results (from the past 240 hour(s))  SARS CORONAVIRUS 2 Nasal Swab Aptima Multi Swab     Status: None   Collection Time: 08/27/18  8:51 PM   Specimen: Aptima Multi Swab; Nasal Swab  Result Value Ref Range Status   SARS Coronavirus 2 NEGATIVE NEGATIVE Final    Comment: (NOTE) SARS-CoV-2 target nucleic acids are NOT DETECTED. The SARS-CoV-2 RNA is generally detectable in upper and lower respiratory specimens during the acute phase of infection. Negative results do not preclude SARS-CoV-2 infection, do not rule out co-infections with other pathogens, and should not be used as the sole basis for treatment or other patient management decisions. Negative results must be combined with clinical observations, patient history, and epidemiological information. The expected result is Negative. Fact Sheet for Patients: SugarRoll.be Fact Sheet for Healthcare Providers: https://www.woods-mathews.com/ This test is not yet approved or cleared by the [place] FDA and  has been authorized for detection and/or diagnosis of SARS-CoV-2 by FDA under an Emergency Use Authorization (EUA). This EUA will remain  in effect (meaning  this test can be used) for the duration of the COVID-19 declaration under Section 56 4(b)(1) of the Act, 21 U.S.C. section 360bbb-3(b)(1), unless the authorization is terminated or revoked sooner. Performed at [facility], [place] [address], [place], [place] [address]   SARS CORONAVIRUS 2 Nasal Swab Aptima Multi Swab     Status: None   Collection Time: 09/01/18  5:00 PM   Specimen: Aptima Multi Swab; Nasal Swab  Result Value Ref Range Status   SARS Coronavirus 2 NEGATIVE NEGATIVE Final    Comment: (NOTE) SARS-CoV-2 target nucleic acids are NOT DETECTED. The SARS-CoV-2 RNA is generally detectable in upper and lower respiratory specimens during  the acute phase of infection. Negative results do not preclude SARS-CoV-2 infection, do not rule out co-infections with other pathogens, and should not be used as the sole basis for treatment or other patient management decisions. Negative results must be combined with clinical observations, patient history, and epidemiological information. The expected result is Negative. Fact Sheet for Patients: SugarRoll.be Fact Sheet for Healthcare Providers: https://www.woods-mathews.com/ This test is not yet approved or cleared by the [place] FDA and  has been authorized for detection and/or diagnosis of SARS-CoV-2 by FDA under an Emergency Use Authorization (EUA). This EUA will remain  in effect (meaning this test can be used) for the duration of the COVID-19 declaration under Section 56 4(b)(1) of the Act, 21 U.S.C. section 360bbb-3(b)(1), unless the authorization is terminated or revoked sooner. Performed at [facility], [place] [address], [place], [place] [address]      Labs: BNP (last 3 results) Recent Labs    06/23/18 1244 07/13/18 1828 08/27/18 1738  BNP 343.6* 146.0* 093.2*   Basic Metabolic Panel: Recent Labs  Lab 08/29/18 0640 08/30/18 0454 08/30/18 1233 08/31/18 0549 09/01/18 0323  NA 139 137 133* 135 138  K 3.1* 3.2* 3.7 3.5 3.8  CL 97* 99 97* 96* 94*  CO2 25 27 29 27  32  GLUCOSE 132* 140* 218* 142* 143*  BUN 11 11 7* 5* 9  CREATININE 0.92 0.70 0.67 0.71 0.76  CALCIUM 8.7* 8.3* 8.3* 8.6* 9.3  MG 1.8 1.6*  --  2.1 1.9   Liver Function Tests: Recent Labs  Lab 08/27/18 1807  AST 19  ALT 17  ALKPHOS 75  BILITOT 0.8  PROT 6.3*  ALBUMIN 3.6   Recent Labs  Lab 08/27/18 1807  LIPASE 27   No results for input(s): AMMONIA in the last 168 hours. CBC: Recent Labs  Lab 08/27/18 1807 08/28/18 0025  WBC 11.8* 12.4*  HGB 12.6 12.3  HCT 38.3 36.6  MCV 100.5* 97.9  PLT 219 222   Cardiac Enzymes: No  results for input(s): CKTOTAL, CKMB, CKMBINDEX, TROPONINI in the last 168 hours. BNP: Invalid input(s): POCBNP CBG: Recent Labs  Lab 09/01/18 0638 09/01/18 1135 09/01/18 1716 09/01/18 2112 09/02/18 0622  GLUCAP 140* 131* 114* 152* 133*   D-Dimer No results for input(s): DDIMER in the last 72 hours. Hgb A1c No results for input(s): HGBA1C in the last 72 hours. Lipid Profile No results for input(s): CHOL, HDL, LDLCALC, TRIG, CHOLHDL, LDLDIRECT in the last 72 hours. Thyroid function studies No results for input(s): TSH, T4TOTAL, T3FREE, THYROIDAB in the last 72 hours.  Invalid input(s): FREET3 Anemia work up No results for input(s): VITAMINB12, FOLATE, FERRITIN, TIBC, IRON, RETICCTPCT in the last 72 hours. Urinalysis    Component Value Date/Time   COLORURINE COLORLESS (A) 06/23/2018 [place] 06/23/2018 1244  LABSPEC 1.003 (L) 06/23/2018 1244   [place] 7.0 06/23/2018 [place] 06/23/2018 [place] 06/23/2018 [place] 06/23/2018 [place] 06/23/2018 [place] 06/23/2018 1244   UROBILINOGEN 1.0 07/06/2012 1130   NITRITE NEGATIVE 06/23/2018 [place] 06/23/2018 1244   Sepsis Labs Invalid input(s): PROCALCITONIN,  WBC,  LACTICIDVEN Microbiology Recent Results (from the past 240 hour(s))  SARS CORONAVIRUS 2 Nasal Swab Aptima Multi Swab     Status: None   Collection Time: 08/27/18  8:51 PM   Specimen: Aptima Multi Swab; Nasal Swab  Result Value Ref Range Status   SARS Coronavirus 2 NEGATIVE NEGATIVE Final    Comment: (NOTE) SARS-CoV-2 target nucleic acids are NOT DETECTED. The SARS-CoV-2 RNA is generally detectable in upper and lower respiratory specimens during the acute phase of infection. Negative results do not preclude SARS-CoV-2 infection, do not rule out co-infections with other pathogens, and should not be used as the sole basis for treatment or  other patient management decisions. Negative results must be combined with clinical observations, patient history, and epidemiological information. The expected result is Negative. Fact Sheet for Patients: SugarRoll.be Fact Sheet for Healthcare Providers: https://www.woods-mathews.com/ This test is not yet approved or cleared by the [place] FDA and  has been authorized for detection and/or diagnosis of SARS-CoV-2 by FDA under an Emergency Use Authorization (EUA). This EUA will remain  in effect (meaning this test can be used) for the duration of the COVID-19 declaration under Section 56 4(b)(1) of the Act, 21 U.S.C. section 360bbb-3(b)(1), unless the authorization is terminated or revoked sooner. Performed at [facility], [place] [address] [place], [place] [address]   SARS CORONAVIRUS 2 Nasal Swab Aptima Multi Swab     Status: None   Collection Time: 09/01/18  5:00 PM   Specimen: Aptima Multi Swab; Nasal Swab  Result Value Ref Range Status   SARS Coronavirus 2 NEGATIVE NEGATIVE Final    Comment: (NOTE) SARS-CoV-2 target nucleic acids are NOT DETECTED. The SARS-CoV-2 RNA is generally detectable in upper and lower respiratory specimens during the acute phase of infection. Negative results do not preclude SARS-CoV-2 infection, do not rule out co-infections with other pathogens, and should not be used as the sole basis for treatment or other patient management decisions. Negative results must be combined with clinical observations, patient history, and epidemiological information. The expected result is Negative. Fact Sheet for Patients: SugarRoll.be Fact Sheet for Healthcare Providers: https://www.woods-mathews.com/ This test is not yet approved or cleared by the [place] FDA and  has been authorized for detection and/or diagnosis of SARS-CoV-2 by FDA under an Emergency Use  Authorization (EUA). This EUA will remain  in effect (meaning this test can be used) for the duration of the COVID-19 declaration under Section 56 4(b)(1) of the Act, 21 U.S.C. section 360bbb-3(b)(1), unless the authorization is terminated or revoked sooner. Performed at [facility], [place] [address] [place], [place] [address]      Time coordinating discharge: Over 30 minutes  SIGNED:   Math Brazie J [place] DO  [facility] 09/02/2018, 9:55 AM

## 2018-09-02 NOTE — Progress Notes (Signed)
Patient c/o chest discomfort/sob. VS  HR 60 O2 98 *on baseline 1.5, increased to 3 for comfort* BP 157/57  Pt is not diaphoretic, pt does report being unsure if she can take her medications at this time.   EKG in progress.    Page to MD  3e19 Ask. c/o cp & sob. VS WNL. NonTele, EKG in progress, pt has pacer.

## 2018-09-02 NOTE — TOC Transition Note (Signed)
Transition of Care [facility]) - CM/SW Discharge Note   Patient Details  Name: Jo [name] MRN: [id number] Date of Birth: 27-Nov-1917  [facility]) CM/SW Contact:  Bary Castilla, LCSW Phone Number: [phone] 09/02/2018, 12:21 PM   Clinical Narrative:    Patient will DC to: [facility]? Anticipated DC date:?09/02/18 Family notified:?Brenda Daughter Transport by: Corey Harold   Per MD patient ready for DC to [facility] RN, patient, patient's family, and facility notified of DC. Discharge Summary sent to facility. RN given number for report [phone]. DC packet on chart. Ambulance transport requested for patient.  CSW signing off.   [place]ery [place], [place] [phone]    Final next level of care: Skilled Nursing Facility Barriers to Discharge: No Barriers Identified   Patient Goals and CMS Choice Patient states their goals for this hospitalization and ongoing recovery are:: ST rehab CMS Medicare.gov Compare Post Acute Care list provided to:: Patient Represenative (must comment)(choice provided by pateint's daughter) Choice offered to / list presented to : (patient's daughter [name])  Discharge Placement              Patient chooses bed at: [facility] Patient to be transferred to facility by: [place] Name of family member notified: [name] Patient and family notified of of transfer: 09/02/18  Discharge Plan and Services In-house Referral: Clinical Social Work                                   Social Determinants of Health ([place]) Interventions     Readmission Risk Interventions No flowsheet data found.

## 2018-09-03 DIAGNOSIS — K56600 Partial intestinal obstruction, unspecified as to cause: Secondary | ICD-10-CM | POA: Diagnosis not present

## 2018-09-03 DIAGNOSIS — E278 Other specified disorders of adrenal gland: Secondary | ICD-10-CM | POA: Diagnosis not present

## 2018-09-03 DIAGNOSIS — I480 Paroxysmal atrial fibrillation: Secondary | ICD-10-CM | POA: Diagnosis not present

## 2018-09-03 DIAGNOSIS — I5032 Chronic diastolic (congestive) heart failure: Secondary | ICD-10-CM | POA: Diagnosis not present

## 2018-09-04 DIAGNOSIS — K56600 Partial intestinal obstruction, unspecified as to cause: Secondary | ICD-10-CM | POA: Diagnosis not present

## 2018-09-04 DIAGNOSIS — D649 Anemia, unspecified: Secondary | ICD-10-CM | POA: Diagnosis not present

## 2018-09-04 DIAGNOSIS — Z79899 Other long term (current) drug therapy: Secondary | ICD-10-CM | POA: Diagnosis not present

## 2018-09-04 DIAGNOSIS — I5032 Chronic diastolic (congestive) heart failure: Secondary | ICD-10-CM | POA: Diagnosis not present

## 2018-09-04 DIAGNOSIS — R0902 Hypoxemia: Secondary | ICD-10-CM | POA: Diagnosis not present

## 2018-09-05 DIAGNOSIS — E876 Hypokalemia: Secondary | ICD-10-CM | POA: Diagnosis not present

## 2018-09-05 DIAGNOSIS — K567 Ileus, unspecified: Secondary | ICD-10-CM | POA: Diagnosis not present

## 2018-09-05 DIAGNOSIS — D72829 Elevated white blood cell count, unspecified: Secondary | ICD-10-CM | POA: Diagnosis not present

## 2018-09-06 DIAGNOSIS — K56600 Partial intestinal obstruction, unspecified as to cause: Secondary | ICD-10-CM | POA: Diagnosis not present

## 2018-09-06 DIAGNOSIS — I5032 Chronic diastolic (congestive) heart failure: Secondary | ICD-10-CM | POA: Diagnosis not present

## 2018-09-06 DIAGNOSIS — E876 Hypokalemia: Secondary | ICD-10-CM | POA: Diagnosis not present

## 2018-09-06 DIAGNOSIS — D72829 Elevated white blood cell count, unspecified: Secondary | ICD-10-CM | POA: Diagnosis not present

## 2018-09-07 DIAGNOSIS — D72829 Elevated white blood cell count, unspecified: Secondary | ICD-10-CM | POA: Diagnosis not present

## 2018-09-07 DIAGNOSIS — K56600 Partial intestinal obstruction, unspecified as to cause: Secondary | ICD-10-CM | POA: Diagnosis not present

## 2018-09-07 DIAGNOSIS — Z79899 Other long term (current) drug therapy: Secondary | ICD-10-CM | POA: Diagnosis not present

## 2018-09-07 DIAGNOSIS — E876 Hypokalemia: Secondary | ICD-10-CM | POA: Diagnosis not present

## 2018-09-10 DIAGNOSIS — Z79899 Other long term (current) drug therapy: Secondary | ICD-10-CM | POA: Diagnosis not present

## 2018-09-10 DIAGNOSIS — E876 Hypokalemia: Secondary | ICD-10-CM | POA: Diagnosis not present

## 2018-09-10 DIAGNOSIS — K567 Ileus, unspecified: Secondary | ICD-10-CM | POA: Diagnosis not present

## 2018-09-10 DIAGNOSIS — R0902 Hypoxemia: Secondary | ICD-10-CM | POA: Diagnosis not present

## 2018-09-10 DIAGNOSIS — D72829 Elevated white blood cell count, unspecified: Secondary | ICD-10-CM | POA: Diagnosis not present

## 2018-09-11 ENCOUNTER — Other Ambulatory Visit: Payer: Self-pay | Admitting: *Deleted

## 2018-09-11 DIAGNOSIS — E876 Hypokalemia: Secondary | ICD-10-CM | POA: Diagnosis not present

## 2018-09-11 DIAGNOSIS — E119 Type 2 diabetes mellitus without complications: Secondary | ICD-10-CM | POA: Diagnosis not present

## 2018-09-11 DIAGNOSIS — Z79899 Other long term (current) drug therapy: Secondary | ICD-10-CM | POA: Diagnosis not present

## 2018-09-11 DIAGNOSIS — I110 Hypertensive heart disease with heart failure: Secondary | ICD-10-CM | POA: Diagnosis not present

## 2018-09-11 DIAGNOSIS — K567 Ileus, unspecified: Secondary | ICD-10-CM | POA: Diagnosis not present

## 2018-09-11 NOTE — Patient Outreach (Signed)
Member assessed for potential [facility] Care Management needs as a benefit of  [employer].  Member is currently receiving rehab therapy at [facility].  Update received from facility discharge planner who reports disposition plans are still unknown. Possible that member will transition to long term care. However, this is not certain as of yet.   Will continue to follow for potential [facility] Care Management services while at SNF.     Marthenia Rolling, MSN-Ed, RN,BSN [place] Acute Care Coordinator [phone] [facility]) [phone]  (Toll free office)

## 2018-09-11 NOTE — Patient Outreach (Signed)
Member assessed for potential [facility] Care Management needs as a benefit of [employer].  Member is currently receiving rehab therapy at [facility].  Notification sent to facility discharge planner to make aware writer is following for potential [facility] Care Management needs. Inquired whether family is considering alternative dc plans. Member from home prior to admission.  Will continue to collaborate with [facility] UM team and facility discharge planner while member is at SNF.    Atika Hall, MSN-Ed, RN,BSN [facility] Post Acute Care Coordinator [phone] ( Business Mobile) [phone]  (Toll free office)  

## 2018-09-12 DIAGNOSIS — K567 Ileus, unspecified: Secondary | ICD-10-CM | POA: Diagnosis not present

## 2018-09-12 DIAGNOSIS — Z79899 Other long term (current) drug therapy: Secondary | ICD-10-CM | POA: Diagnosis not present

## 2018-09-12 DIAGNOSIS — J189 Pneumonia, unspecified organism: Secondary | ICD-10-CM | POA: Diagnosis not present

## 2018-09-12 DIAGNOSIS — E876 Hypokalemia: Secondary | ICD-10-CM | POA: Diagnosis not present

## 2018-09-13 DIAGNOSIS — E876 Hypokalemia: Secondary | ICD-10-CM | POA: Diagnosis not present

## 2018-09-13 DIAGNOSIS — J189 Pneumonia, unspecified organism: Secondary | ICD-10-CM | POA: Diagnosis not present

## 2018-09-13 DIAGNOSIS — K567 Ileus, unspecified: Secondary | ICD-10-CM | POA: Diagnosis not present

## 2018-09-13 DIAGNOSIS — K56600 Partial intestinal obstruction, unspecified as to cause: Secondary | ICD-10-CM | POA: Diagnosis not present

## 2018-09-14 DIAGNOSIS — E119 Type 2 diabetes mellitus without complications: Secondary | ICD-10-CM | POA: Diagnosis not present

## 2018-09-14 DIAGNOSIS — K567 Ileus, unspecified: Secondary | ICD-10-CM | POA: Diagnosis not present

## 2018-09-14 DIAGNOSIS — J189 Pneumonia, unspecified organism: Secondary | ICD-10-CM | POA: Diagnosis not present

## 2018-09-14 DIAGNOSIS — R7989 Other specified abnormal findings of blood chemistry: Secondary | ICD-10-CM | POA: Diagnosis not present

## 2018-09-14 DIAGNOSIS — I5032 Chronic diastolic (congestive) heart failure: Secondary | ICD-10-CM | POA: Diagnosis not present

## 2018-09-17 DIAGNOSIS — E876 Hypokalemia: Secondary | ICD-10-CM | POA: Diagnosis not present

## 2018-09-17 DIAGNOSIS — K567 Ileus, unspecified: Secondary | ICD-10-CM | POA: Diagnosis not present

## 2018-09-17 DIAGNOSIS — Z79899 Other long term (current) drug therapy: Secondary | ICD-10-CM | POA: Diagnosis not present

## 2018-09-17 DIAGNOSIS — R7989 Other specified abnormal findings of blood chemistry: Secondary | ICD-10-CM | POA: Diagnosis not present

## 2018-09-18 DIAGNOSIS — K567 Ileus, unspecified: Secondary | ICD-10-CM | POA: Diagnosis not present

## 2018-09-18 DIAGNOSIS — E876 Hypokalemia: Secondary | ICD-10-CM | POA: Diagnosis not present

## 2018-09-18 DIAGNOSIS — Z7189 Other specified counseling: Secondary | ICD-10-CM | POA: Diagnosis not present

## 2018-09-18 DIAGNOSIS — F419 Anxiety disorder, unspecified: Secondary | ICD-10-CM | POA: Diagnosis not present

## 2018-09-19 DIAGNOSIS — E876 Hypokalemia: Secondary | ICD-10-CM | POA: Diagnosis not present

## 2018-09-19 DIAGNOSIS — D72829 Elevated white blood cell count, unspecified: Secondary | ICD-10-CM | POA: Diagnosis not present

## 2018-09-19 DIAGNOSIS — J189 Pneumonia, unspecified organism: Secondary | ICD-10-CM | POA: Diagnosis not present

## 2018-09-19 DIAGNOSIS — R7989 Other specified abnormal findings of blood chemistry: Secondary | ICD-10-CM | POA: Diagnosis not present

## 2018-09-19 DIAGNOSIS — K567 Ileus, unspecified: Secondary | ICD-10-CM | POA: Diagnosis not present

## 2018-09-20 DIAGNOSIS — K567 Ileus, unspecified: Secondary | ICD-10-CM | POA: Diagnosis not present

## 2018-09-20 DIAGNOSIS — E876 Hypokalemia: Secondary | ICD-10-CM | POA: Diagnosis not present

## 2018-09-20 DIAGNOSIS — J189 Pneumonia, unspecified organism: Secondary | ICD-10-CM | POA: Diagnosis not present

## 2018-09-21 DIAGNOSIS — F419 Anxiety disorder, unspecified: Secondary | ICD-10-CM | POA: Diagnosis not present

## 2018-09-21 DIAGNOSIS — K567 Ileus, unspecified: Secondary | ICD-10-CM | POA: Diagnosis not present

## 2018-09-21 DIAGNOSIS — D72829 Elevated white blood cell count, unspecified: Secondary | ICD-10-CM | POA: Diagnosis not present

## 2018-09-21 DIAGNOSIS — R54 Age-related physical debility: Secondary | ICD-10-CM | POA: Diagnosis not present

## 2018-09-24 DIAGNOSIS — R7989 Other specified abnormal findings of blood chemistry: Secondary | ICD-10-CM | POA: Diagnosis not present

## 2018-09-24 DIAGNOSIS — D649 Anemia, unspecified: Secondary | ICD-10-CM | POA: Diagnosis not present

## 2018-09-24 DIAGNOSIS — E876 Hypokalemia: Secondary | ICD-10-CM | POA: Diagnosis not present

## 2018-09-24 DIAGNOSIS — K567 Ileus, unspecified: Secondary | ICD-10-CM | POA: Diagnosis not present

## 2018-09-26 DIAGNOSIS — K567 Ileus, unspecified: Secondary | ICD-10-CM | POA: Diagnosis not present

## 2018-09-26 DIAGNOSIS — Z515 Encounter for palliative care: Secondary | ICD-10-CM | POA: Diagnosis not present

## 2018-09-26 DIAGNOSIS — E876 Hypokalemia: Secondary | ICD-10-CM | POA: Diagnosis not present

## 2018-09-27 DIAGNOSIS — K567 Ileus, unspecified: Secondary | ICD-10-CM | POA: Diagnosis not present

## 2018-09-27 DIAGNOSIS — R1310 Dysphagia, unspecified: Secondary | ICD-10-CM | POA: Diagnosis not present

## 2018-09-27 DIAGNOSIS — Z515 Encounter for palliative care: Secondary | ICD-10-CM | POA: Diagnosis not present

## 2018-09-27 DIAGNOSIS — R0902 Hypoxemia: Secondary | ICD-10-CM | POA: Diagnosis not present

## 2018-09-28 DIAGNOSIS — K567 Ileus, unspecified: Secondary | ICD-10-CM | POA: Diagnosis not present

## 2018-10-03 ENCOUNTER — Other Ambulatory Visit: Payer: Self-pay | Admitting: *Deleted

## 2018-10-03 NOTE — Patient Outreach (Signed)
Member assessed for potential [facility] Care Management needs as a benefit of  [employer] Medicare.  Per Patient Pearletha [place] member expired. Confirmed with [name] discharge planner that Mrs. Mota expired in the facility.   Will sign off.   Marthenia Rolling, MSN-Ed, RN,BSN [place] Acute Care Coordinator [phone] [facility]) [phone]  (Toll free office)

## 2018-10-09 ENCOUNTER — Telehealth: Payer: PRIVATE HEALTH INSURANCE | Admitting: Cardiology

## 2018-10-25 DIAGNOSIS — 419620001 Death: Secondary | SNOMED CT | POA: Diagnosis not present

## 2018-10-25 DEATH — deceased
